# Patient Record
Sex: Male | Born: 1970 | Race: White | Hispanic: No | Marital: Married | Smoking: Never smoker
Health system: Southern US, Community
[De-identification: ages and names within clinical notes are randomized; demographics above are authoritative.]

## PROBLEM LIST (undated history)

## (undated) DIAGNOSIS — K219 Gastro-esophageal reflux disease without esophagitis: Secondary | ICD-10-CM

## (undated) DIAGNOSIS — Z9889 Other specified postprocedural states: Secondary | ICD-10-CM

## (undated) DIAGNOSIS — J45909 Unspecified asthma, uncomplicated: Secondary | ICD-10-CM

## (undated) DIAGNOSIS — Z8719 Personal history of other diseases of the digestive system: Secondary | ICD-10-CM

## (undated) DIAGNOSIS — J189 Pneumonia, unspecified organism: Secondary | ICD-10-CM

## (undated) DIAGNOSIS — M5136 Other intervertebral disc degeneration, lumbar region: Secondary | ICD-10-CM

## (undated) DIAGNOSIS — M2420 Disorder of ligament, unspecified site: Secondary | ICD-10-CM

## (undated) DIAGNOSIS — K5900 Constipation, unspecified: Secondary | ICD-10-CM

## (undated) DIAGNOSIS — R112 Nausea with vomiting, unspecified: Secondary | ICD-10-CM

## (undated) DIAGNOSIS — M1990 Unspecified osteoarthritis, unspecified site: Secondary | ICD-10-CM

## (undated) DIAGNOSIS — G4730 Sleep apnea, unspecified: Secondary | ICD-10-CM

## (undated) DIAGNOSIS — K760 Fatty (change of) liver, not elsewhere classified: Secondary | ICD-10-CM

## (undated) DIAGNOSIS — M5410 Radiculopathy, site unspecified: Secondary | ICD-10-CM

## (undated) DIAGNOSIS — F419 Anxiety disorder, unspecified: Secondary | ICD-10-CM

## (undated) DIAGNOSIS — Z87442 Personal history of urinary calculi: Secondary | ICD-10-CM

## (undated) DIAGNOSIS — M51369 Other intervertebral disc degeneration, lumbar region without mention of lumbar back pain or lower extremity pain: Secondary | ICD-10-CM

## (undated) DIAGNOSIS — K4020 Bilateral inguinal hernia, without obstruction or gangrene, not specified as recurrent: Secondary | ICD-10-CM

## (undated) HISTORY — PX: CHOLECYSTECTOMY: SHX55

## (undated) HISTORY — PX: OTHER SURGICAL HISTORY: SHX169

## (undated) HISTORY — PX: WISDOM TOOTH EXTRACTION: SHX21

---

## 2001-04-13 ENCOUNTER — Emergency Department (HOSPITAL_COMMUNITY): Admission: EM | Admit: 2001-04-13 | Discharge: 2001-04-14 | Payer: Self-pay | Admitting: Emergency Medicine

## 2001-04-14 ENCOUNTER — Encounter: Payer: Self-pay | Admitting: Emergency Medicine

## 2001-09-25 ENCOUNTER — Inpatient Hospital Stay (HOSPITAL_COMMUNITY): Admission: EM | Admit: 2001-09-25 | Discharge: 2001-09-30 | Payer: Self-pay | Admitting: Psychiatry

## 2002-03-04 ENCOUNTER — Encounter: Payer: Self-pay | Admitting: Emergency Medicine

## 2002-03-04 ENCOUNTER — Emergency Department (HOSPITAL_COMMUNITY): Admission: EM | Admit: 2002-03-04 | Discharge: 2002-03-04 | Payer: Self-pay | Admitting: Emergency Medicine

## 2002-06-17 ENCOUNTER — Emergency Department (HOSPITAL_COMMUNITY): Admission: EM | Admit: 2002-06-17 | Discharge: 2002-06-17 | Payer: Self-pay | Admitting: Emergency Medicine

## 2002-06-17 ENCOUNTER — Encounter: Payer: Self-pay | Admitting: Emergency Medicine

## 2002-08-03 ENCOUNTER — Encounter (HOSPITAL_COMMUNITY): Admission: RE | Admit: 2002-08-03 | Discharge: 2002-09-02 | Payer: Self-pay | Admitting: Orthopedic Surgery

## 2003-07-28 ENCOUNTER — Emergency Department (HOSPITAL_COMMUNITY): Admission: EM | Admit: 2003-07-28 | Discharge: 2003-07-28 | Payer: Self-pay | Admitting: *Deleted

## 2004-03-30 DIAGNOSIS — Z87442 Personal history of urinary calculi: Secondary | ICD-10-CM

## 2004-03-30 HISTORY — DX: Personal history of urinary calculi: Z87.442

## 2004-09-02 ENCOUNTER — Ambulatory Visit: Payer: Self-pay | Admitting: Internal Medicine

## 2004-09-02 ENCOUNTER — Ambulatory Visit (HOSPITAL_COMMUNITY): Admission: RE | Admit: 2004-09-02 | Discharge: 2004-09-02 | Payer: Self-pay | Admitting: Internal Medicine

## 2004-10-18 ENCOUNTER — Emergency Department (HOSPITAL_COMMUNITY): Admission: EM | Admit: 2004-10-18 | Discharge: 2004-10-18 | Payer: Self-pay | Admitting: Emergency Medicine

## 2005-01-20 ENCOUNTER — Inpatient Hospital Stay (HOSPITAL_COMMUNITY): Admission: EM | Admit: 2005-01-20 | Discharge: 2005-01-24 | Payer: Self-pay | Admitting: Emergency Medicine

## 2007-05-08 ENCOUNTER — Emergency Department (HOSPITAL_COMMUNITY): Admission: EM | Admit: 2007-05-08 | Discharge: 2007-05-08 | Payer: Self-pay | Admitting: Emergency Medicine

## 2008-06-06 ENCOUNTER — Emergency Department (HOSPITAL_COMMUNITY): Admission: EM | Admit: 2008-06-06 | Discharge: 2008-06-06 | Payer: Self-pay | Admitting: Emergency Medicine

## 2010-08-15 NOTE — Discharge Summary (Signed)
NAME:  Zachary Nash, BISPING NO.:  [id number]   MEDICAL RECORD NO.:  [id number]          PATIENT TYPE:  INP   LOCATION:  A306                          FACILITY:  [facility]   PHYSICIAN:  Corrie Mckusick, M.D.  DATE OF BIRTH:  1970-09-22   DATE OF ADMISSION:  01/20/2005  DATE OF DISCHARGE:  10/28/2006LH                                 DISCHARGE SUMMARY   For history of present illness and past medical history, please see  admission H&P.   HOSPITAL COURSE:  A 40 year old gentleman with past medical history of  cholecystectomy and overall pretty healthy who presents with renal colic. He  was seen by Dr. Sherwood Gambler and the physician's assistant in our office and had a  negative workup as an outpatient. He was given orders by Dr. Sherwood Gambler and sent  to the ER for admission for further workup and urology consult. Initial CT  was suggestive of renal colic but otherwise negative. Dr. Jerre Simon agreed with  keeping him on fluids.   We changed the patient to Rocephin as he had had a questionable allergy to  Levaquin, and we kept him on this. Dr. Jerre Simon also discussed starting  Flomax.   After admission, the patient was taken care of by his primary care  physician, Dr. Sherwood Gambler, as I was out of town. He continued to improve on a  daily basis and was kept pain free with Dilaudid. He was ready for discharge  on October 28. Please see Dr. Sharyon Medicus note on day of discharge for discharge  physical.   DISCHARGE MEDICATIONS:  1.  Dilaudid 4 mg 1 to 2 every 4 hours as needed for pain.  2.  Keflex 500 mg t.i.d. for a week.  3.  Pyridium 100 mg t.i.d. p.r.n.   Again, see Dr. Sharyon Medicus note for details on day of discharge.      Corrie Mckusick, M.D.  Electronically Signed     JCG/MEDQ  D:  02/18/2005  T:  02/18/2005  Job:  [id number]

## 2010-08-15 NOTE — H&P (Signed)
NAME:  Zachary Nash, Zachary Nash               ACCOUNT NO.:  [id number]   MEDICAL RECORD NO.:  [id number]          PATIENT TYPE:  INP   LOCATION:  A306                          FACILITY:  [facility]   PHYSICIAN:  Corrie Mckusick, M.D.  DATE OF BIRTH:  03-08-1971   DATE OF ADMISSION:  01/20/2005  DATE OF DISCHARGE:  LH                                HISTORY & PHYSICAL   ADMISSION DIAGNOSIS:  Flank pain.   HISTORY OF PRESENT ILLNESS:  This is a 40 year old gentleman who saw our  physicians assistant in the office with flank pain, was sent for CT which  was negative.  He is a patient of Dr. Sharyon Medicus and was followed back up with  Dr. Sherwood Gambler and was sent to the emergency department.  In the emergency room,  workup was overall negative.  Dr. Sherwood Gambler knows the patient well and will see  him in the hospital.  He basically had a one day history of recurrent  episodes of left flank pain with nausea and no vomiting, no fever or chills.  He has been complaining of severe urgency and dysuria.   He was placed in the hospital for PEG control and further delineation of his  history.   PAST MEDICAL HISTORY:  Cholecystectomy.  No other past medical history at  all.   FAMILY HISTORY:  Nonsignificant.   SOCIAL HISTORY:  Negative for alcohol use, tobacco use.   PHYSICAL EXAMINATION:  VITAL SIGNS:  Temperature 98.0, blood pressure  107/62, pulse 74, respirations 20, weight 190.  GENERAL APPEARANCE:  When I saw him, he was pleasant, talkative and in no  acute distress.  HEENT:  Negative. Throat is clear.  Mucous membranes moist.  NECK:  Supple.  No lymphadenopathy.  CHEST:  Clear to auscultation bilaterally.  CARDIOVASCULAR:  Regular rate and rhythm.  Normal S1, S2.  NO S3, murmurs,  gallops, rubs.  ABDOMEN:  Soft.  There is tenderness on the left flank.  Otherwise, really  benign exam.  No rebound or guarding.  No hepatosplenomegaly.  EXTREMITIES:  No edema.   LABORATORY DATA:  Please see flow sheet for details. CT  was negative as  stated.   ASSESSMENT/PLAN:  50.  A 40 year old with what appears to be left renal colic.  Plan to admit      for IV fluids and pain control. 2.  Orders were given by Dr. Sherwood Gambler.      Please see order sheet for details.  2.  Urology will be consulted and further workup in progress.      Corrie Mckusick, M.D.  Electronically Signed     JCG/MEDQ  D:  01/23/2005  T:  01/23/2005  Job:  [id number]

## 2010-08-15 NOTE — Discharge Summary (Signed)
NAME:  CARA, AGUINO                         ACCOUNT NO.:  [id number]   MEDICAL RECORD NO.:  [id number]                   PATIENT TYPE:  IPS   LOCATION:  0500                                 FACILITY:  [facility]   PHYSICIAN:  Reymundo Poll. Dub Mikes, M.D.                DATE OF BIRTH:  [birth year]   DATE OF ADMISSION:  09/25/2001  DATE OF DISCHARGE:  09/30/2001                                 DISCHARGE SUMMARY   CHIEF COMPLAINT AND PRESENT ILLNESS:  This was the first admission to [facility] for this 40 year old male who reported to the ER  complaining of suicidal thoughts and plan to run his truck off the road into  a tree.  He reports increasing depression for last 4-6 weeks, endorsing  loneliness in job, poor sleep.  He is a Naval architect.  Decreased appetite  with weight loss.  Poor concentration.  Increased sadness.  Progressing  anhedonia.  Marital conflict over the past 1-2 years, since her son was  born.  Just started driving alone many hours.  Denies any homicidal ideation  or auditory or visual hallucinations.   PAST PSYCHIATRIC HISTORY:  First time inpatient.  No previous treatment.   ALCOHOL/DRUG HISTORY:  Denies the use or abuse of any substances.  He is a  Naval architect.   PHYSICAL EXAMINATION:  Performed and failed to show any acute findings.   MENTAL STATUS EXAM:  Healthy, fully alert male, polite, cooperative,  pleasant.  Speech normal and relevant.  Mood depressed.  Affect depressed.  Thought processes deal with the events, feeling very overwhelmed.  Some  suicidal ruminations but will contract for safety.  No homicidal ideation.  No auditory or visual hallucinations.  No evidence of psychosis.  Cognition  well-preserved.   ADMISSION DIAGNOSES:   AXIS I:  Major depression, single episode.   AXIS II:  No diagnosis.   AXIS III:  No diagnosis.   AXIS IV:  Moderate.   AXIS V:  Global Assessment of Functioning upon admission 38; highest Global  Assessment of Functioning in the last year 80.   LABORATORY DATA:  Blood chemistries were within normal limits.  Thyroid  profile within normal limits.   HOSPITAL COURSE:  He was admitted and started intensive individual and group  psychotherapy.  He was able to look into the events, his long history of  trauma, the feeling when he left the parental rights of her older daughter,  his ability to get himself to accept it, how he feels strange about the  younger son because he did not give the older daughter what he can give his  son now, a lot of conflict.  He continued to work, in the group setting and  the individual setting, on his issues, his losses, worked on his self-  esteem.  Gradually, he started to improve.  His mood became brighter.  There  was  some resolution in the conflict with the wife.  She was willing to get  back together and for them to work on their issues.  He was started on  Lexapro 10 mg that he tolerated quite well.  By September 30, 2001, he had  obtained full benefit from the hospitalization, in full contact with  reality.  Mood improved.  Affect bright.  No suicidal ideation.  No  homicidal ideation.  Wanted to pursue further outpatient treatment.  Has  worked on a lot of his issues but understands that there are a lot of other  issues to have to work with.   DISCHARGE DIAGNOSES:   AXIS I:  Major depression, single episode.   AXIS II:  No diagnosis.   AXIS III:  No diagnosis.   AXIS IV:  Moderate.   AXIS V:  Global Assessment of Functioning upon discharge 60.   DISCHARGE MEDICATIONS:  1. Lexapro 10 mg daily.  2. Ambien 10 mg at bedtime for sleep.   FOLLOW UP:  [facility].                                                Madie Reno A. Dub Mikes, M.D.    IAL/MEDQ  D:  11/02/2001  T:  11/06/2001  Job:  [id number]

## 2010-08-15 NOTE — Op Note (Signed)
NAME:  Zachary Nash, Zachary Nash               ACCOUNT NO.:  [id number]   MEDICAL RECORD NO.:  [id number]          PATIENT TYPE:  AMB   LOCATION:  DAY                           FACILITY:  [facility]   PHYSICIAN:  Lionel December, M.D.    DATE OF BIRTH:  03/03/1971   DATE OF PROCEDURE:  09/02/2004  DATE OF DISCHARGE:                                 OPERATIVE REPORT   PROCEDURE:  Esophagogastroduodenoscopy with esophageal dilation.   INDICATION:  [name] is a 40 year old Caucasian male with two-month history  of dysphagia, primarily to solids.  He has had frequent heartburn for the  last two weeks.  He also has lost 20 pounds because food would not go down.  He has not had any hematemesis or melena.  He does not take any medicines  other than occasional Tylenol.   The procedure risks were reviewed the patient, informed consent was  obtained.   PREMEDICATION:  Cetacaine spray for pharyngeal topical anesthesia, Demerol  50 mg IV, Versed 9 mg IV in divided dose.   FINDINGS:  Procedure performed in endoscopy suite.  The patient's vital  signs and O2 saturation were monitored during procedure and remained stable.  The patient was in the placed left lateral recumbent position and the  Olympus video scope was passed via oropharynx without any difficulty into  esophagus.   Esophagus:  Mucosa of the proximal and middle third was normal.  Distally he  had two ulcers extending into GE junction.  He had soft stricture at GE  junction, which was at 38 cm and hiatus was 41.   Stomach:  It was empty and distended very well with insufflation.  Folds of  the proximal stomach were normal.  Examination of the mucosa revealed antral  granularity and erythema but no erosions or ulcers were noted.  The pyloric  channel was patent.  Angularis, fundus and cardia were examined by  retroflexing the scope and were normal.   Duodenum:  Bulbar mucosa was normal.  The scope was passed to the second  part of the duodenum, where  mucosa and folds were normal.  Endoscope was  withdrawn.   Esophagus was dilated by passing 56-French Maloney dilator to full  insertion.  As the dilator was withdrawn, endoscope was passed again and the  stricture was noted to have been disrupted at two sites.  Pictures taken for  the record and the endoscope was withdrawn.  The patient tolerated the  procedure well.   FINAL DIAGNOSES:  1.  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  The stricture was dilated by passing 56-French [facility]      dilator.  2.  Small sliding hiatal hernia.  3.  Nonerosive antral gastritis.   RECOMMENDATIONS:  1.  Antireflux measures.  2.  Prevacid SoluTab 30 mg p.o. q.a.m., prescription given for 30 with 11      refills.  3.  H. pylori serology will be checked.  4.  I would like to see the patient office in three months from now.  NR/MEDQ  D:  09/02/2004  T:  09/02/2004  Job:  [id number]   cc:   Kirk Ruths, M.D.  [address]  [place]  [place] [address]  Fax: [phone]

## 2010-08-15 NOTE — Op Note (Signed)
NAME:  Zachary Nash, Zachary Nash               ACCOUNT NO.:  [id number]   MEDICAL RECORD NO.:  [id number]          PATIENT TYPE:  INP   LOCATION:  A306                          FACILITY:  [facility]   PHYSICIAN:  Ky Barban, M.D.DATE OF BIRTH:  20-Jan-1971   DATE OF PROCEDURE:  01/23/2005  DATE OF DISCHARGE:                                 OPERATIVE REPORT   PREOPERATIVE DIAGNOSIS:  Left flank pain, dysuria.   POSTOPERATIVE DIAGNOSIS:  Posterior urethritis, urethritis, mild cystitis,  normal left retrograde pyelogram.   PROCEDURE:  Cystoscopy, left retrograde pyelogram.   ANESTHESIA:  General.   PROCEDURE:  The patient was given general endotracheal anesthesia, placed in  lithotomy position, usual prep and drape. A #25 cystoscope was introduced  under direct vision. Anterior urethra near the sphincter in the bulbar area  is reddish and friable and that extends into the prostatic urethra. Bladder  neck was opened. Bladder trigone area is slightly reddish and friable,  bleeds very easily, but there is no other gross evidence of any tumor,  stone, or foreign body in the bladder. Both orifices located at normal size  with a clear efflux. Left ureteral orifice catheterized with wedge catheter  and Hypaque injected under fluoroscopic control. Ureter is followed towards  the renal pelvis. The ureter looks normal caliber. No filling defect. Renal  pelvis normal size with nice cup shaped calices. No filling defect, and I  can see the drainage appears to be satisfactory from the renal pelvis and  the ureter. Cystoscope was removed. Rectal exam was done. His anal sphincter  was relaxed, prostate feels normal, no rectal mass. I see a lot of gas in  his colon and rectum. Otherwise retrograde pyelogram is completely normal.  All of the instruments were removed. The patient left the operating room in  satisfactory condition.      Ky Barban, M.D.  Electronically Signed     MIJ/MEDQ  D:   01/23/2005  T:  01/23/2005  Job:  [id number]

## 2010-08-15 NOTE — Consult Note (Signed)
NAME:  Zachary Nash, Zachary Nash               ACCOUNT NO.:  [id number]   MEDICAL RECORD NO.:  [id number]          PATIENT TYPE:  INP   LOCATION:  A306                          FACILITY:  [facility]   PHYSICIAN:  Ky Barban, M.D.DATE OF BIRTH:  May 14, 1970   DATE OF CONSULTATION:  01/21/2005  DATE OF DISCHARGE:                                   CONSULTATION   UROLOGY CONSULTATION:   CHIEF COMPLAINT:  Recurrent left flank pain with nausea; no vomiting, fever,  chills; has severe dysuria.   HISTORY:  This 40 year old gentleman came to the emergency room yesterday  about noontime complaining of 1-day history of recurrent episodes of left  flank pain with nausea, no vomiting, fever, chills, complaining of severe  urgency and dysuria.  I was called from the emergency room to see the  patient in the ER.  In the ER he had a CT scan which was negative for any  stone and I was in the operating room.  I could not come and see but I  offered that the patient can come in a couple of hours to the office then I  can see him there or be admitted so patient was admitted by his primary care  physician so I am here to see him today.  Patient otherwise no fever,  chills, no history of kidney stones in the past, still having this dysuria  and bothersome urgency.   PAST MEDICAL HISTORY:  He had cholecystectomy in the past, no other problem  like diabetes or hypertension, never had any kidney stones.   FAMILY HISTORY:  No history of kidney stone.   PERSONAL HISTORY:  Negative.   REVIEW OF SYSTEMS:  Unremarkable.   EXAMINATION:  Blood pressure is 130/80, temperature is normal.  Abdomen  soft, flat, liver/spleen/kidneys are not palpable, deep tenderness left  flank and left lower quadrant.  External genitalia circumcised, meatus  adequate, testicles are normal.   CT SCAN:  CT scan, as I mentioned above, basically negative for any kidney  stones.   LABORATORY DATA:  WBC count is 5.3, hematocrit 47.9.  He  has 7% eosinophils  in the blood.  Urinalysis is negative except wbc's are 11-20.  His  electrolytes:  Sodium 136, potassium 3.6, chloride 105, CO2 is 25, glucose  96, BUN 11, creatinine 1.3.   IMPRESSION:  Left renal colic.  Although his CT scan is negative, the  history is very suggestive of renal colic.  He might have passed a stone and  still has some blood clot in his ureter but his urinalysis being completely  negative goes against it.  I will treat him symptomatically.  He has already  been started on Rocephin, I will add Flomax, if there is any stone or small  clot that should come out.  I reassured the patient who was very  apprehensive and  concerned that he might have some serious disease.  I reassured him and I  will see him over the next 24 hours to see how he does.  If he continues to  have the problem then we will  cystoscope him under anesthesia on Friday.  I  appreciate Dr. Phillips Odor for letting me see him.      Ky Barban, M.D.  Electronically Signed     MIJ/MEDQ  D:  01/21/2005  T:  01/21/2005  Job:  [id number]   cc:   Dr. Phillips Odor

## 2011-04-27 ENCOUNTER — Ambulatory Visit: Payer: Self-pay | Admitting: Physical Therapy

## 2012-03-17 ENCOUNTER — Encounter (HOSPITAL_COMMUNITY): Payer: Self-pay | Admitting: *Deleted

## 2012-03-17 ENCOUNTER — Emergency Department (HOSPITAL_COMMUNITY)
Admission: EM | Admit: 2012-03-17 | Discharge: 2012-03-17 | Disposition: A | Discharge status: Home / Self Care | Payer: BC Managed Care – PPO | Attending: Emergency Medicine | Admitting: Emergency Medicine

## 2012-03-17 DIAGNOSIS — R509 Fever, unspecified: Secondary | ICD-10-CM | POA: Insufficient documentation

## 2012-03-17 DIAGNOSIS — R05 Cough: Secondary | ICD-10-CM | POA: Insufficient documentation

## 2012-03-17 DIAGNOSIS — R059 Cough, unspecified: Secondary | ICD-10-CM | POA: Insufficient documentation

## 2012-03-17 DIAGNOSIS — J3489 Other specified disorders of nose and nasal sinuses: Secondary | ICD-10-CM | POA: Insufficient documentation

## 2012-03-17 DIAGNOSIS — J45909 Unspecified asthma, uncomplicated: Secondary | ICD-10-CM | POA: Insufficient documentation

## 2012-03-17 DIAGNOSIS — H9202 Otalgia, left ear: Secondary | ICD-10-CM

## 2012-03-17 DIAGNOSIS — J029 Acute pharyngitis, unspecified: Secondary | ICD-10-CM | POA: Insufficient documentation

## 2012-03-17 DIAGNOSIS — Z880 Allergy status to penicillin: Secondary | ICD-10-CM | POA: Insufficient documentation

## 2012-03-17 DIAGNOSIS — Z8669 Personal history of other diseases of the nervous system and sense organs: Secondary | ICD-10-CM | POA: Insufficient documentation

## 2012-03-17 DIAGNOSIS — H9209 Otalgia, unspecified ear: Secondary | ICD-10-CM | POA: Insufficient documentation

## 2012-03-17 DIAGNOSIS — J069 Acute upper respiratory infection, unspecified: Secondary | ICD-10-CM | POA: Insufficient documentation

## 2012-03-17 DIAGNOSIS — M542 Cervicalgia: Secondary | ICD-10-CM | POA: Insufficient documentation

## 2012-03-17 HISTORY — DX: Unspecified asthma, uncomplicated: J45.909

## 2012-03-17 MED ORDER — OXYCODONE-ACETAMINOPHEN 5-325 MG PO TABS: 1.0000 | ORAL_TABLET | ORAL | Status: AC | PRN

## 2012-03-17 MED ORDER — DEXAMETHASONE SODIUM PHOSPHATE 4 MG/ML IJ SOLN
10.0000 mg | Freq: Once | INTRAMUSCULAR | Status: AC
  Administered 2012-03-17: 10 mg via INTRAMUSCULAR
  Filled 2012-03-17: qty 3

## 2012-03-17 MED ORDER — OXYCODONE-ACETAMINOPHEN 5-325 MG PO TABS
1.0000 | ORAL_TABLET | Freq: Once | ORAL | Status: AC
  Administered 2012-03-17: 1 via ORAL
  Filled 2012-03-17: qty 1

## 2012-03-17 NOTE — ED Notes (Signed)
Pt reports left ear pain x 2 years; c/o cough, nasal congestion x 1 week; was seen and tx for same 3 days ago; reports when swallowing a pill this morning, he felt a "pop" in left ear, and has since had pain radiating from left ear to left jaw and left side of throat.  Reports pain so severe that it is interfering with him swallowing.

## 2012-03-17 NOTE — ED Notes (Signed)
Instructions, prescriptions and f/u information given/reviewed - verbalizes understanding.  

## 2012-03-19 NOTE — ED Provider Notes (Signed)
History     CSN: [id number]  Arrival date & time 03/17/12  1133   First MD Initiated Contact with Patient 03/17/12 1148      Chief Complaint  Patient presents with  . Otalgia  . Nasal Congestion  . Cough    (Consider location/radiation/quality/duration/timing/severity/associated sxs/prior treatment) HPI Comments: Patient with hx of chronic ear pain for 2 years, c/o sudden onset of increased, sharp pain to his left ear that began after he swallowed a pill and felt a "pop" to his ear.  States the pain has been radiating into his neck and side of his face since that time.  States he was seen for ear pain, fever, nasal congestion 3 days ago at the local urgent care and is currently taking zithromax, sterapred and cortisporin otic drops.  He denies neck stiffness, continued fever, vomiting, headache or visual changes.  Patient is a 41 y.o. male presenting with ear pain and cough. The history is provided by the patient.  Otalgia This is a recurrent problem. The current episode started more than 1 week ago. There is pain in the left ear. The problem occurs constantly. The problem has been gradually worsening. There has been no fever. The pain is moderate. Associated symptoms include rhinorrhea, sore throat, neck pain and cough. Pertinent negatives include no ear discharge, no headaches, no hearing loss, no abdominal pain, no diarrhea, no vomiting and no rash. His past medical history is significant for chronic ear infection.  Cough Associated symptoms include ear pain, rhinorrhea and sore throat. Pertinent negatives include no chest pain, no chills, no headaches, no shortness of breath and no wheezing.    Past Medical History  Diagnosis Date  . Asthma     History reviewed. No pertinent past surgical history.  No family history on file.  History  Substance Use Topics  . Smoking status: Never Smoker   . Smokeless tobacco: Not on file  . Alcohol Use: No      Review of Systems   Constitutional: Negative for fever, chills, activity change and appetite change.  HENT: Positive for ear pain, congestion, sore throat, rhinorrhea and neck pain. Negative for hearing loss, nosebleeds, facial swelling, neck stiffness, tinnitus and ear discharge.   Respiratory: Positive for cough. Negative for choking, shortness of breath and wheezing.   Cardiovascular: Negative for chest pain.  Gastrointestinal: Negative for vomiting, abdominal pain and diarrhea.  Skin: Negative for rash.  Neurological: Negative for dizziness, syncope, facial asymmetry, speech difficulty, numbness and headaches.  Hematological: Negative for adenopathy.  All other systems reviewed and are negative.    Allergies  Penicillins  Home Medications   Current Outpatient Rx  Name  Route  Sig  Dispense  Refill  . OXYCODONE-ACETAMINOPHEN 5-325 MG PO TABS   Oral   Take 1 tablet by mouth every 4 (four) hours as needed for pain.   20 tablet   0     BP 112/65  Pulse 100  Temp 98 F (36.7 C) (Oral)  Resp 20  Ht 5\' 10"  (1.778 m)  Wt 190 lb (86.183 kg)  BMI 27.26 kg/m2  SpO2 99%  Physical Exam  Nursing note and vitals reviewed. Constitutional: He is oriented to person, place, and time. He appears well-developed and well-nourished. No distress.  HENT:  Head: Normocephalic and atraumatic.  Right Ear: Tympanic membrane and ear canal normal.  Left Ear: There is tenderness. No drainage or swelling. No mastoid tenderness. Tympanic membrane is not perforated, not erythematous, not retracted and not  bulging. A middle ear effusion is present. No hemotympanum.  Mouth/Throat: Uvula is midline, oropharynx is clear and moist and mucous membranes are normal.  Eyes: EOM are normal. Pupils are equal, round, and reactive to light.  Neck: Normal range of motion. Neck supple.  Cardiovascular: Normal rate, regular rhythm, normal heart sounds and intact distal pulses.   No murmur heard. Pulmonary/Chest: Effort normal and  breath sounds normal.  Musculoskeletal: Normal range of motion. He exhibits no edema and no tenderness.  Lymphadenopathy:    He has no cervical adenopathy.  Neurological: He is alert and oriented to person, place, and time. He exhibits normal muscle tone. Coordination normal.  Skin: Skin is warm and dry.    ED Course  Procedures (including critical care time)  Labs Reviewed - No data to display No results found.   1. Otalgia of left ear   2. URI (upper respiratory infection)       MDM    Pt is resting, vitals stable,  No meningeal signs.  Airway patent.  No cervical lymphadenopathy.  Pt is well appearing.  Will treat with percocet #20 and pt agrees to continue previous medications as directed.  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  No perforation of the TM.        Dae Antonucci L. [place], [place] 03/19/12 [phone]

## 2012-03-22 NOTE — ED Provider Notes (Signed)
Medical screening examination/treatment/procedure(s) were performed by non-physician practitioner and as supervising physician I was immediately available for consultation/collaboration.  Jaque Dacy, MD 03/22/12 1833 

## 2012-10-05 ENCOUNTER — Encounter (HOSPITAL_COMMUNITY): Payer: Self-pay | Admitting: Emergency Medicine

## 2012-10-05 ENCOUNTER — Emergency Department (HOSPITAL_COMMUNITY)
Admission: EM | Admit: 2012-10-05 | Discharge: 2012-10-06 | Disposition: A | Discharge status: Home / Self Care | Payer: BC Managed Care – PPO | Attending: Emergency Medicine | Admitting: Emergency Medicine

## 2012-10-05 DIAGNOSIS — T7840XA Allergy, unspecified, initial encounter: Secondary | ICD-10-CM

## 2012-10-05 DIAGNOSIS — T380X5A Adverse effect of glucocorticoids and synthetic analogues, initial encounter: Secondary | ICD-10-CM | POA: Insufficient documentation

## 2012-10-05 DIAGNOSIS — J45901 Unspecified asthma with (acute) exacerbation: Secondary | ICD-10-CM | POA: Insufficient documentation

## 2012-10-05 DIAGNOSIS — R6883 Chills (without fever): Secondary | ICD-10-CM | POA: Insufficient documentation

## 2012-10-05 DIAGNOSIS — Z880 Allergy status to penicillin: Secondary | ICD-10-CM | POA: Insufficient documentation

## 2012-10-05 DIAGNOSIS — R21 Rash and other nonspecific skin eruption: Secondary | ICD-10-CM | POA: Insufficient documentation

## 2012-10-05 MED ORDER — METHYLPREDNISOLONE SODIUM SUCC 125 MG IJ SOLR
125.0000 mg | Freq: Once | INTRAMUSCULAR | Status: AC
  Administered 2012-10-05: 125 mg via INTRAVENOUS
  Filled 2012-10-05: qty 2

## 2012-10-05 MED ORDER — PREDNISONE 20 MG PO TABS: ORAL_TABLET | ORAL | Status: DC

## 2012-10-05 MED ORDER — IPRATROPIUM BROMIDE 0.02 % IN SOLN
0.5000 mg | Freq: Once | RESPIRATORY_TRACT | Status: AC
  Administered 2012-10-05: 0.5 mg via RESPIRATORY_TRACT
  Filled 2012-10-05: qty 2.5

## 2012-10-05 MED ORDER — FAMOTIDINE IN NACL 20-0.9 MG/50ML-% IV SOLN
20.0000 mg | Freq: Once | INTRAVENOUS | Status: AC
  Administered 2012-10-05: 20 mg via INTRAVENOUS
  Filled 2012-10-05: qty 50

## 2012-10-05 MED ORDER — DIPHENHYDRAMINE HCL 50 MG/ML IJ SOLN
25.0000 mg | Freq: Once | INTRAMUSCULAR | Status: AC
  Administered 2012-10-05: 25 mg via INTRAVENOUS
  Filled 2012-10-05: qty 1

## 2012-10-05 MED ORDER — SODIUM CHLORIDE 0.9 % IV BOLUS (SEPSIS)
1000.0000 mL | Freq: Once | INTRAVENOUS | Status: AC
  Administered 2012-10-05: 1000 mL via INTRAVENOUS

## 2012-10-05 MED ORDER — ALBUTEROL SULFATE (5 MG/ML) 0.5% IN NEBU
5.0000 mg | INHALATION_SOLUTION | Freq: Once | RESPIRATORY_TRACT | Status: AC
  Administered 2012-10-05: 5 mg via RESPIRATORY_TRACT
  Filled 2012-10-05: qty 1

## 2012-10-05 NOTE — ED Notes (Signed)
Allergic reaction, hives, "feels like heart is skipping"   Started on BREO this week   - this is his third treatment.

## 2012-10-05 NOTE — ED Notes (Signed)
Pt c/o possible allergic reaction to new medication. Pt states he started a new inhaler 3 days ago and since starting reports increased SOB and fatigue. Pt presents with a rash to his back which he first noticed tonight. Pt denies dizziness, lightheadedness, chest pain. Pt has hx of asthma.

## 2012-10-05 NOTE — ED Provider Notes (Signed)
History  This chart was scribed for Zachary Hutching, MD by Ardelia Mems, ED Scribe. This patient was seen in room APA18/APA18 and the patient's care was started at 9:54 PM.  CSN: [id number]  Arrival date & time 10/05/12  2142   Chief Complaint  Patient presents with  . Allergic Reaction    new meds for asthma COPD   "BREO"    The history is provided by the patient. No language interpreter was used.   HPI Comments: Zachary Nash is a 42 y.o. male with a hx of asthma who presents to the Emergency Department  complaining of 3 days of SOB as well as an allergic reaction onset earlier today. Pt states that he was switched from an Advair inhaler to Breo inhaler this week as a new asthma medication. Pt has used his inhaler 3 times, once a day for the last three days, and states that he has had gradually worsening SOB for 3 days. Pt states that his back broke out into a itchy rash today, and he is not sure whether this happened because of the medication or because he was working outside today. Pt also states that he feels like his heart is skipping, and reports chills over the past 3 days. Pt denies fever, chest pain, sore throat, cough, vomiting or any other symptoms. Pt denies alcohol use and denies any hx of smoking.   PCP- Dr. Kirstie Peri   Past Medical History  Diagnosis Date  . Asthma    Past Surgical History  Procedure Laterality Date  . Cholecystectomy     No family history on file. History  Substance Use Topics  . Smoking status: Never Smoker   . Smokeless tobacco: Not on file  . Alcohol Use: No    Review of Systems  Constitutional: Positive for chills. Negative for fever.  HENT: Negative for congestion, sore throat, rhinorrhea and neck pain.   Eyes: Negative for visual disturbance.  Respiratory: Positive for shortness of breath. Negative for cough.   Cardiovascular: Negative for chest pain.  Gastrointestinal: Negative for nausea, vomiting, abdominal pain and diarrhea.   Genitourinary: Negative for dysuria.  Musculoskeletal: Negative for back pain.  Skin: Positive for rash.  Neurological: Negative for headaches.  Psychiatric/Behavioral: Negative for confusion.  A complete 10 system review of systems was obtained and all systems are negative except as noted in the HPI and PMH.   Allergies  Penicillins  Home Medications  No current outpatient prescriptions on file.  Triage Vitals: BP 138/85  Pulse 102  Temp(Src) 98.1 F (36.7 C) (Oral)  Resp 22  Ht 5' 10.25" (1.784 m)  Wt 198 lb (89.812 kg)  BMI 28.22 kg/m2  SpO2 100%  Physical Exam  Nursing note and vitals reviewed. Constitutional: He is oriented to person, place, and time. He appears well-developed and well-nourished.  HENT:  Head: Normocephalic and atraumatic.  Eyes: Conjunctivae and EOM are normal. Pupils are equal, round, and reactive to light.  Neck: Normal range of motion. Neck supple.  Cardiovascular: Normal rate, regular rhythm and normal heart sounds.   Pulmonary/Chest: Effort normal and breath sounds normal. No respiratory distress. He has no wheezes.  Abdominal: Soft. Bowel sounds are normal.  Musculoskeletal: Normal range of motion.  Neurological: He is alert and oriented to person, place, and time.  Skin: Skin is warm and dry.  Diffuse, discreet, erythematous, papular rash.  Psychiatric: He has a normal mood and affect.    ED Course  Procedures (including critical care time)  DIAGNOSTIC STUDIES: Oxygen Saturation is 100% on RA, normal by my interpretation.    COORDINATION OF CARE: 10:00 PM- Pt advised of plan to receive a breathing treatment as well as Solu-medrol, benadryl and pepcid and pt agrees.  Medications  methylPREDNISolone sodium succinate (SOLU-MEDROL) 125 mg/2 mL injection 125 mg (125 mg Intravenous Given 10/05/12 2220)  famotidine (PEPCID) IVPB 20 mg (20 mg Intravenous New Bag/Given 10/05/12 2221)  diphenhydrAMINE (BENADRYL) injection 25 mg (25 mg Intravenous  Given 10/05/12 2220)  sodium chloride 0.9 % bolus 1,000 mL (1,000 mLs Intravenous New Bag/Given 10/05/12 2218)  albuterol (PROVENTIL) (5 MG/ML) 0.5% nebulizer solution 5 mg (5 mg Nebulization Given 10/05/12 2256)  ipratropium (ATROVENT) nebulizer solution 0.5 mg (0.5 mg Nebulization Given 10/05/12 2256)    Labs Reviewed - No data to display  No results found.  No diagnosis found.  MDM  Patient and wife attributes symptoms to new medication named BREO.   Patient feels much better after IV site Medrol, IV Pepcid, IV Benadryl.   Discontinue new medication.   Rx prednisone for 5 days       I personally performed the services described in this documentation, which was scribed in my presence. The recorded information has been reviewed and is accurate.   Zachary Hutching, MD 10/05/12 2348

## 2013-05-15 ENCOUNTER — Encounter (HOSPITAL_COMMUNITY): Payer: Self-pay | Admitting: Emergency Medicine

## 2013-05-15 ENCOUNTER — Emergency Department (HOSPITAL_COMMUNITY): Payer: BC Managed Care – PPO

## 2013-05-15 ENCOUNTER — Inpatient Hospital Stay (HOSPITAL_COMMUNITY)
Admission: EM | Admit: 2013-05-15 | Discharge: 2013-05-18 | DRG: 149 | Disposition: A | Discharge status: Home / Self Care | Payer: BC Managed Care – PPO | Attending: Family Medicine | Admitting: Family Medicine

## 2013-05-15 DIAGNOSIS — W010XXA Fall on same level from slipping, tripping and stumbling without subsequent striking against object, initial encounter: Secondary | ICD-10-CM | POA: Diagnosis present

## 2013-05-15 DIAGNOSIS — R000 Tachycardia, unspecified: Secondary | ICD-10-CM | POA: Diagnosis present

## 2013-05-15 DIAGNOSIS — S161XXA Strain of muscle, fascia and tendon at neck level, initial encounter: Secondary | ICD-10-CM

## 2013-05-15 DIAGNOSIS — Y92410 Unspecified street and highway as the place of occurrence of the external cause: Secondary | ICD-10-CM

## 2013-05-15 DIAGNOSIS — IMO0002 Reserved for concepts with insufficient information to code with codable children: Secondary | ICD-10-CM | POA: Diagnosis present

## 2013-05-15 DIAGNOSIS — R42 Dizziness and giddiness: Secondary | ICD-10-CM | POA: Diagnosis present

## 2013-05-15 DIAGNOSIS — G44309 Post-traumatic headache, unspecified, not intractable: Secondary | ICD-10-CM | POA: Diagnosis present

## 2013-05-15 DIAGNOSIS — F411 Generalized anxiety disorder: Secondary | ICD-10-CM | POA: Diagnosis present

## 2013-05-15 DIAGNOSIS — R739 Hyperglycemia, unspecified: Secondary | ICD-10-CM | POA: Diagnosis present

## 2013-05-15 DIAGNOSIS — K219 Gastro-esophageal reflux disease without esophagitis: Secondary | ICD-10-CM | POA: Diagnosis present

## 2013-05-15 DIAGNOSIS — J45909 Unspecified asthma, uncomplicated: Secondary | ICD-10-CM | POA: Diagnosis present

## 2013-05-15 DIAGNOSIS — R51 Headache: Secondary | ICD-10-CM | POA: Diagnosis present

## 2013-05-15 DIAGNOSIS — M549 Dorsalgia, unspecified: Secondary | ICD-10-CM

## 2013-05-15 DIAGNOSIS — Z79899 Other long term (current) drug therapy: Secondary | ICD-10-CM

## 2013-05-15 DIAGNOSIS — M5410 Radiculopathy, site unspecified: Secondary | ICD-10-CM | POA: Diagnosis present

## 2013-05-15 DIAGNOSIS — R7309 Other abnormal glucose: Secondary | ICD-10-CM | POA: Diagnosis present

## 2013-05-15 HISTORY — DX: Radiculopathy, site unspecified: M54.10

## 2013-05-15 LAB — POCT I-STAT, CHEM 8
BUN: 18 mg/dL (ref 6–23)
CREATININE: 1.2 mg/dL (ref 0.50–1.35)
Calcium, Ion: 1.14 mmol/L (ref 1.12–1.23)
Chloride: 105 mEq/L (ref 96–112)
Glucose, Bld: 117 mg/dL — ABNORMAL HIGH (ref 70–99)
HCT: 40 % (ref 39.0–52.0)
HEMOGLOBIN: 13.6 g/dL (ref 13.0–17.0)
POTASSIUM: 3.5 meq/L — AB (ref 3.7–5.3)
Sodium: 141 mEq/L (ref 137–147)
TCO2: 25 mmol/L (ref 0–100)

## 2013-05-15 MED ORDER — SODIUM CHLORIDE 0.9 % IV BOLUS (SEPSIS)
1000.0000 mL | Freq: Once | INTRAVENOUS | Status: AC
  Administered 2013-05-15: 1000 mL via INTRAVENOUS

## 2013-05-15 MED ORDER — HYDROCODONE-ACETAMINOPHEN 5-325 MG PO TABS: 1.0000 | ORAL_TABLET | Freq: Four times a day (QID) | ORAL | Status: DC | PRN

## 2013-05-15 MED ORDER — HYDROMORPHONE HCL PF 1 MG/ML IJ SOLN
1.0000 mg | Freq: Once | INTRAMUSCULAR | Status: AC
  Administered 2013-05-15: 1 mg via INTRAMUSCULAR
  Filled 2013-05-15: qty 1

## 2013-05-15 NOTE — Discharge Instructions (Signed)
Follow up with your md later this week for recheck
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
## 2013-05-16 ENCOUNTER — Observation Stay (HOSPITAL_COMMUNITY): Payer: BC Managed Care – PPO

## 2013-05-16 DIAGNOSIS — M549 Dorsalgia, unspecified: Secondary | ICD-10-CM

## 2013-05-16 DIAGNOSIS — S139XXA Sprain of joints and ligaments of unspecified parts of neck, initial encounter: Secondary | ICD-10-CM

## 2013-05-16 DIAGNOSIS — R42 Dizziness and giddiness: Principal | ICD-10-CM

## 2013-05-16 LAB — CBC WITH DIFFERENTIAL/PLATELET
BASOS ABS: 0 10*3/uL (ref 0.0–0.1)
Basophils Relative: 0 % (ref 0–1)
Eosinophils Absolute: 0.1 10*3/uL (ref 0.0–0.7)
Eosinophils Relative: 1 % (ref 0–5)
HCT: 41.1 % (ref 39.0–52.0)
Hemoglobin: 14.8 g/dL (ref 13.0–17.0)
LYMPHS ABS: 1.1 10*3/uL (ref 0.7–4.0)
LYMPHS PCT: 16 % (ref 12–46)
MCH: 30.6 pg (ref 26.0–34.0)
MCHC: 36 g/dL (ref 30.0–36.0)
MCV: 84.9 fL (ref 78.0–100.0)
Monocytes Absolute: 0.5 10*3/uL (ref 0.1–1.0)
Monocytes Relative: 7 % (ref 3–12)
NEUTROS ABS: 5.1 10*3/uL (ref 1.7–7.7)
NEUTROS PCT: 76 % (ref 43–77)
PLATELETS: 209 10*3/uL (ref 150–400)
RBC: 4.84 MIL/uL (ref 4.22–5.81)
RDW: 12.8 % (ref 11.5–15.5)
WBC: 6.7 10*3/uL (ref 4.0–10.5)

## 2013-05-16 LAB — COMPREHENSIVE METABOLIC PANEL
ALT: 18 U/L (ref 0–53)
AST: 16 U/L (ref 0–37)
Albumin: 3.9 g/dL (ref 3.5–5.2)
Alkaline Phosphatase: 64 U/L (ref 39–117)
BILIRUBIN TOTAL: 0.6 mg/dL (ref 0.3–1.2)
BUN: 16 mg/dL (ref 6–23)
CALCIUM: 8.7 mg/dL (ref 8.4–10.5)
CHLORIDE: 104 meq/L (ref 96–112)
CO2: 25 meq/L (ref 19–32)
Creatinine, Ser: 1.19 mg/dL (ref 0.50–1.35)
GFR calc Af Amer: 86 mL/min — ABNORMAL LOW (ref >90)
GFR calc non Af Amer: 74 mL/min — ABNORMAL LOW (ref >90)
Glucose, Bld: 121 mg/dL — ABNORMAL HIGH (ref 70–99)
POTASSIUM: 4.2 meq/L (ref 3.7–5.3)
Sodium: 139 mEq/L (ref 137–147)
Total Protein: 6.3 g/dL (ref 6.0–8.3)

## 2013-05-16 MED ORDER — ONDANSETRON HCL 4 MG PO TABS: 4.0000 mg | ORAL_TABLET | Freq: Four times a day (QID) | ORAL | Status: DC | PRN

## 2013-05-16 MED ORDER — MECLIZINE HCL 12.5 MG PO TABS
25.0000 mg | ORAL_TABLET | Freq: Three times a day (TID) | ORAL | Status: DC
  Administered 2013-05-16 – 2013-05-18 (×8): 25 mg via ORAL
  Filled 2013-05-16 (×8): qty 2

## 2013-05-16 MED ORDER — MECLIZINE HCL 12.5 MG PO TABS
ORAL_TABLET | ORAL | Status: AC
  Filled 2013-05-16: qty 1

## 2013-05-16 MED ORDER — LORAZEPAM 2 MG/ML IJ SOLN
INTRAMUSCULAR | Status: AC
  Filled 2013-05-16: qty 1

## 2013-05-16 MED ORDER — LORAZEPAM 2 MG/ML IJ SOLN
1.0000 mg | Freq: Once | INTRAMUSCULAR | Status: AC
  Administered 2013-05-16: 1 mg via INTRAVENOUS

## 2013-05-16 MED ORDER — CYCLOBENZAPRINE HCL 10 MG PO TABS
10.0000 mg | ORAL_TABLET | Freq: Three times a day (TID) | ORAL | Status: DC
  Administered 2013-05-16: 10 mg via ORAL
  Filled 2013-05-16: qty 1

## 2013-05-16 MED ORDER — MECLIZINE HCL 12.5 MG PO TABS
25.0000 mg | ORAL_TABLET | Freq: Once | ORAL | Status: AC
  Administered 2013-05-16: 25 mg via ORAL

## 2013-05-16 MED ORDER — KETOROLAC TROMETHAMINE 15 MG/ML IJ SOLN
30.0000 mg | Freq: Four times a day (QID) | INTRAMUSCULAR | Status: DC
  Administered 2013-05-16 – 2013-05-17 (×6): 30 mg via INTRAVENOUS
  Filled 2013-05-16 (×6): qty 2

## 2013-05-16 MED ORDER — GADOBENATE DIMEGLUMINE 529 MG/ML IV SOLN
19.0000 mL | Freq: Once | INTRAVENOUS | Status: AC | PRN
  Administered 2013-05-16: 19 mL via INTRAVENOUS

## 2013-05-16 MED ORDER — HYDROCODONE-ACETAMINOPHEN 5-325 MG PO TABS: 1.0000 | ORAL_TABLET | ORAL | Status: DC | PRN

## 2013-05-16 MED ORDER — ONDANSETRON HCL 4 MG/2ML IJ SOLN: 4.0000 mg | Freq: Four times a day (QID) | INTRAMUSCULAR | Status: DC | PRN

## 2013-05-16 MED ORDER — SODIUM CHLORIDE 0.9 % IV SOLN
INTRAVENOUS | Status: DC
  Administered 2013-05-16 – 2013-05-17 (×4): via INTRAVENOUS

## 2013-05-16 MED ORDER — SODIUM CHLORIDE 0.9 % IV SOLN
500.0000 mg | Freq: Once | INTRAVENOUS | Status: AC
  Administered 2013-05-16: 500 mg via INTRAVENOUS
  Filled 2013-05-16: qty 4
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
## 2013-05-16 NOTE — ED Notes (Signed)
Attempted to ambulate patient but patient was dizzy-headed and unable to take one step. Placed him back in bed and notified  Dr. Roxanne Mins.

## 2013-05-17 ENCOUNTER — Encounter (HOSPITAL_COMMUNITY): Payer: Self-pay | Admitting: Internal Medicine

## 2013-05-17 DIAGNOSIS — M5410 Radiculopathy, site unspecified: Secondary | ICD-10-CM | POA: Diagnosis present

## 2013-05-17 DIAGNOSIS — IMO0002 Reserved for concepts with insufficient information to code with codable children: Secondary | ICD-10-CM

## 2013-05-17 LAB — CBC
HCT: 39.7 % (ref 39.0–52.0)
Hemoglobin: 14.1 g/dL (ref 13.0–17.0)
MCH: 30.4 pg (ref 26.0–34.0)
MCHC: 35.5 g/dL (ref 30.0–36.0)
MCV: 85.6 fL (ref 78.0–100.0)
Platelets: 205 10*3/uL (ref 150–400)
RBC: 4.64 MIL/uL (ref 4.22–5.81)
RDW: 12.8 % (ref 11.5–15.5)
WBC: 9.5 10*3/uL (ref 4.0–10.5)

## 2013-05-17 LAB — BASIC METABOLIC PANEL
BUN: 20 mg/dL (ref 6–23)
CHLORIDE: 108 meq/L (ref 96–112)
CO2: 22 mEq/L (ref 19–32)
CREATININE: 1.16 mg/dL (ref 0.50–1.35)
Calcium: 9 mg/dL (ref 8.4–10.5)
GFR calc non Af Amer: 76 mL/min — ABNORMAL LOW (ref >90)
GFR, EST AFRICAN AMERICAN: 88 mL/min — AB (ref >90)
Glucose, Bld: 144 mg/dL — ABNORMAL HIGH (ref 70–99)
Potassium: 4.4 mEq/L (ref 3.7–5.3)
Sodium: 141 mEq/L (ref 137–147)

## 2013-05-17 MED ORDER — ACETAMINOPHEN 325 MG PO TABS
650.0000 mg | ORAL_TABLET | Freq: Four times a day (QID) | ORAL | Status: DC | PRN
  Administered 2013-05-17 – 2013-05-18 (×2): 650 mg via ORAL
  Filled 2013-05-17 (×2): qty 2

## 2013-05-17 MED ORDER — ALPRAZOLAM 0.25 MG PO TABS
0.2500 mg | ORAL_TABLET | Freq: Two times a day (BID) | ORAL | Status: DC | PRN
  Administered 2013-05-17: 0.25 mg via ORAL
  Filled 2013-05-17: qty 1

## 2013-05-17 MED ORDER — PANTOPRAZOLE SODIUM 40 MG PO TBEC
40.0000 mg | DELAYED_RELEASE_TABLET | Freq: Every day | ORAL | Status: DC
  Administered 2013-05-17 – 2013-05-18 (×2): 40 mg via ORAL
  Filled 2013-05-17 (×2): qty 1
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
## 2013-05-17 NOTE — Progress Notes (Signed)
Patient states he takes Omeprazole at home and cannot eat without taking something.  Patient does not have anything ordered for acid reflux.  Dr. Sarajane Jews notified via text page.

## 2013-05-17 NOTE — Progress Notes (Addendum)
1306 - Patient complains of his "heart beating fast".  Patient resting in bed.  Cervical collar in place.  BP 137/76.  Pulse 106-114.  O2 sats on RA 98%.  Patient was previously sitting in the chair by the window.  Stated that his heart started beating fast while he was sitting in the chair and that he got up and into the bed.  Patient has towel wrapped around shoulders.  Asked patient if he felt warm.  Patient stated that he had felt cool and put the towel around shoulders to warm up.  Dyanne Carrel, NP notified via text page.  Returned page and stated to go ahead with Toradol scheduled for patient now and to ambulate patient.

## 2013-05-17 NOTE — Progress Notes (Signed)
Patient c/o of headache.  Does not have PRN pain medication.  Dr. Sarajane Jews notified.
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
## 2013-05-17 NOTE — Care Management Note (Signed)
    Page 1 of 1   05/17/2013     2:50:54 PM   CARE MANAGEMENT NOTE 05/17/2013  Patient:  Zachary Nash, Zachary Nash   Account Number:  [id number]  Date Initiated:  05/17/2013  Documentation initiated by:  Claretha Cooper  Subjective/Objective Assessment:   Pt from home where he lives with his wife. CM asked to speak with pt regarding his insurance coverage. Pt states his wife is concerned about cost and insurance coverage. Wife called while CM in room and CM was asked to talk with her. Wife     Action/Plan:   assured that if insurance did not cover cost that [facility] would work out [employer] consultant. Wife did not seem satisfied with conversation. Requested to talk with pt, pt refused.   Anticipated DC Date:  05/18/2013   Anticipated DC Plan:  [place]  CM consult      Choice offered to / List presented to:             Status of service:  Completed, signed off Medicare Important Message given?   (If response is "NO", the following Medicare IM given date fields will be blank) Date Medicare IM given:   Date Additional Medicare IM given:    Discharge Disposition:    Per UR Regulation:    If discussed at Long Length of Stay Meetings, dates discussed:    Comments:  05/17/13 Claretha Cooper RN BSN CM

## 2013-05-18 DIAGNOSIS — G44309 Post-traumatic headache, unspecified, not intractable: Secondary | ICD-10-CM | POA: Diagnosis present

## 2013-05-18 DIAGNOSIS — R739 Hyperglycemia, unspecified: Secondary | ICD-10-CM | POA: Diagnosis present

## 2013-05-18 MED ORDER — MECLIZINE HCL 25 MG PO TABS: 25.0000 mg | ORAL_TABLET | Freq: Three times a day (TID) | ORAL | Status: DC

## 2013-05-18 MED ORDER — MECLIZINE HCL 25 MG PO TABS: 25.0000 mg | ORAL_TABLET | Freq: Three times a day (TID) | ORAL | Status: DC | PRN
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
## 2013-05-18 NOTE — Progress Notes (Signed)
Patient asking for note for work and when he may return to work.  Dr. Sarajane Jews notified via text page.  Patient states his PCP is no longer Dr. Manuella Ghazi and that he see Dr. Delphina Cahill.
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
## 2013-05-18 NOTE — Progress Notes (Signed)
AVS reviewed with patient.  Prescription and work note provided to patient.  NP stated that prescription for apin medication was sent to pharmacy.  Patient aware.  Patient verbalized understanding of discharge instructions, physician follow-up and medications.  Patient provided with information for OP Pt eval/tx.  RN called and left message with referral information.  Patient's IV removed.  Site WNL.  Patient transported by NT via w/c to main entrance for discharge.  Patient stable at time of discharge.

## 2013-05-18 NOTE — Progress Notes (Signed)
Patient c/o of dizziness and headache.  Does not want to walk at this time.

## 2014-03-12 ENCOUNTER — Emergency Department (HOSPITAL_COMMUNITY)
Admission: EM | Admit: 2014-03-12 | Discharge: 2014-03-12 | Disposition: A | Discharge status: Home / Self Care | Payer: BC Managed Care – PPO | Attending: Emergency Medicine | Admitting: Emergency Medicine

## 2014-03-12 ENCOUNTER — Encounter (HOSPITAL_COMMUNITY): Payer: Self-pay | Admitting: Emergency Medicine

## 2014-03-12 DIAGNOSIS — J45909 Unspecified asthma, uncomplicated: Secondary | ICD-10-CM | POA: Insufficient documentation

## 2014-03-12 DIAGNOSIS — Y998 Other external cause status: Secondary | ICD-10-CM | POA: Insufficient documentation

## 2014-03-12 DIAGNOSIS — T518X1A Toxic effect of other alcohols, accidental (unintentional), initial encounter: Secondary | ICD-10-CM | POA: Insufficient documentation

## 2014-03-12 DIAGNOSIS — Y9289 Other specified places as the place of occurrence of the external cause: Secondary | ICD-10-CM | POA: Diagnosis not present

## 2014-03-12 DIAGNOSIS — Y9389 Activity, other specified: Secondary | ICD-10-CM | POA: Diagnosis not present

## 2014-03-12 DIAGNOSIS — Z8739 Personal history of other diseases of the musculoskeletal system and connective tissue: Secondary | ICD-10-CM | POA: Diagnosis not present

## 2014-03-12 DIAGNOSIS — X58XXXA Exposure to other specified factors, initial encounter: Secondary | ICD-10-CM | POA: Diagnosis not present

## 2014-03-12 DIAGNOSIS — T189XXA Foreign body of alimentary tract, part unspecified, initial encounter: Secondary | ICD-10-CM

## 2014-03-12 DIAGNOSIS — Z79899 Other long term (current) drug therapy: Secondary | ICD-10-CM | POA: Insufficient documentation

## 2014-03-12 LAB — COMPREHENSIVE METABOLIC PANEL
ALBUMIN: 4.3 g/dL (ref 3.5–5.2)
ALK PHOS: 79 U/L (ref 39–117)
ALT: 48 U/L (ref 0–53)
ALT: 42 U/L (ref 0–53)
AST: 39 U/L — ABNORMAL HIGH (ref 0–37)
AST: 31 U/L (ref 0–37)
Albumin: 3.7 g/dL (ref 3.5–5.2)
Alkaline Phosphatase: 68 U/L (ref 39–117)
Anion gap: 17 — ABNORMAL HIGH (ref 5–15)
Anion gap: 11 (ref 5–15)
BUN: 17 mg/dL (ref 6–23)
BUN: 15 mg/dL (ref 6–23)
CALCIUM: 8.6 mg/dL (ref 8.4–10.5)
CO2: 18 mEq/L — ABNORMAL LOW (ref 19–32)
CO2: 23 meq/L (ref 19–32)
CREATININE: 1.37 mg/dL — AB (ref 0.50–1.35)
Calcium: 9.4 mg/dL (ref 8.4–10.5)
Chloride: 107 mEq/L (ref 96–112)
Chloride: 108 mEq/L (ref 96–112)
Creatinine, Ser: 1.34 mg/dL (ref 0.50–1.35)
GFR calc Af Amer: 74 mL/min — ABNORMAL LOW (ref >90)
GFR calc non Af Amer: 64 mL/min — ABNORMAL LOW (ref >90)
GFR calc non Af Amer: 62 mL/min — ABNORMAL LOW (ref >90)
GFR, EST AFRICAN AMERICAN: 72 mL/min — AB (ref >90)
Glucose, Bld: 104 mg/dL — ABNORMAL HIGH (ref 70–99)
Glucose, Bld: 100 mg/dL — ABNORMAL HIGH (ref 70–99)
POTASSIUM: 3.9 meq/L (ref 3.7–5.3)
Potassium: 3.6 mEq/L — ABNORMAL LOW (ref 3.7–5.3)
Sodium: 142 mEq/L (ref 137–147)
Sodium: 142 mEq/L (ref 137–147)
Total Bilirubin: 0.7 mg/dL (ref 0.3–1.2)
Total Bilirubin: 0.7 mg/dL (ref 0.3–1.2)
Total Protein: 7.4 g/dL (ref 6.0–8.3)
Total Protein: 6.4 g/dL (ref 6.0–8.3)

## 2014-03-12 MED ORDER — ONDANSETRON HCL 4 MG/2ML IJ SOLN
4.0000 mg | Freq: Once | INTRAMUSCULAR | Status: AC
  Administered 2014-03-12: 4 mg via INTRAVENOUS
  Filled 2014-03-12: qty 2

## 2014-03-12 MED ORDER — SODIUM CHLORIDE 0.9 % IV SOLN
Freq: Once | INTRAVENOUS | Status: AC
  Administered 2014-03-12: 11:00:00 via INTRAVENOUS

## 2014-03-12 MED ORDER — ONDANSETRON HCL 4 MG/2ML IJ SOLN
INTRAMUSCULAR | Status: AC
Start: 2014-03-12 — End: 2014-03-12
  Administered 2014-03-12: 4 mg via INTRAVENOUS
  Filled 2014-03-12: qty 2

## 2014-03-12 MED ORDER — ONDANSETRON HCL 4 MG/2ML IJ SOLN
4.0000 mg | Freq: Once | INTRAMUSCULAR | Status: AC
  Administered 2014-03-12: 4 mg via INTRAVENOUS

## 2014-03-12 NOTE — ED Provider Notes (Signed)
CSN: [id number]     Arrival date & time 03/12/14  1011 History  This chart was scribed for [name], MD by [name], ED Scribe. This patient was seen in room APA07/APA07 and the patient's care was started at 11:20 AM.    No chief complaint on file.   The history is provided by the patient. No language interpreter was used.     HPI Comments: Zachary Nash is a 43 y.o. male who presents to the Emergency Department for ingestion of 100% antifreeze that gushed out when the container tipped over on his tractor truck. He states that he was trying to stop the gushing, which occurred at about eye level, but the pressure was too high and he swallowed a mouthful of anti-freeze. Patient confirms he has been vomiting frequently.   Past Medical History  Diagnosis Date  . Asthma   . Radicular pain of left lower extremity    Past Surgical History  Procedure Laterality Date  . Cholecystectomy     Family History  Problem Relation Age of Onset  . Heart failure Father    History  Substance Use Topics  . Smoking status: Never Smoker   . Smokeless tobacco: Not on file  . Alcohol Use: No    Review of Systems  Constitutional: Negative for appetite change and fatigue.  HENT: Negative for congestion and sinus pressure.   Eyes: Negative for discharge.  Respiratory: Negative for cough.   Cardiovascular: Negative for chest pain.  Gastrointestinal: Positive for nausea and vomiting. Negative for abdominal pain and diarrhea.  Genitourinary: Negative for frequency and hematuria.  Musculoskeletal: Negative for back pain.  Skin: Negative for rash.  Neurological: Negative for seizures and headaches.  Psychiatric/Behavioral: Negative for hallucinations.      Allergies  Penicillins  Home Medications   Prior to Admission medications   Medication Sig Start Date End Date Taking? Authorizing Provider  acetaminophen (TYLENOL) 500 MG tablet Take 1,000 mg by mouth daily as needed.     Historical Provider, MD  albuterol (PROVENTIL HFA;VENTOLIN HFA) 108 (90 BASE) MCG/ACT inhaler Inhale 2 puffs into the lungs every 6 (six) hours as needed for wheezing.    Historical Provider, MD  HYDROcodone-acetaminophen (NORCO/VICODIN) 5-325 MG per tablet Take 1 tablet by mouth every 6 (six) hours as needed for moderate pain. 05/15/13   [name], MD  ibuprofen (ADVIL,MOTRIN) 200 MG tablet Take 400 mg by mouth daily as needed for moderate pain.    Historical Provider, MD  meclizine (ANTIVERT) 25 MG tablet Take 1 tablet (25 mg total) by mouth 3 (three) times daily as needed for dizziness. 05/18/13   Radene Gunning, NP  Multiple Vitamins-Minerals (ALIVE MENS ENERGY PO) Take 1 tablet by mouth daily.    Historical Provider, MD  omeprazole (PRILOSEC) 20 MG capsule Take 20 mg by mouth daily.    Historical Provider, MD   There were no vitals taken for this visit. Physical Exam  Constitutional: He is oriented to person, place, and time. He appears well-developed.  Normal exam.  HENT:  Head: Normocephalic.  Eyes: Conjunctivae and EOM are normal. No scleral icterus.  Neck: Neck supple. No thyromegaly present.  Cardiovascular: Normal rate and regular rhythm.  Exam reveals no gallop and no friction rub.   No murmur heard. Pulmonary/Chest: No stridor. He has no wheezes. He has no rales. He exhibits no tenderness.  Abdominal: He exhibits no distension. There is no tenderness. There is no rebound.  Musculoskeletal: Normal range  of motion. He exhibits no edema.  Lymphadenopathy:    He has no cervical adenopathy.  Neurological: He is oriented to person, place, and time. He exhibits normal muscle tone. Coordination normal.  Skin: No rash noted. No erythema.  Psychiatric: He has a normal mood and affect. His behavior is normal.  Nursing note and vitals reviewed.   ED Course  Procedures (including critical care time)  DIAGNOSTIC STUDIES: Oxygen Saturation is 99% on room air, normal by my  interpretation.    COORDINATION OF CARE: 11:21 AM - Discussed treatment plan with pt at bedside which includes blood tests and pt agreed to plan.   Labs Review Labs Reviewed  COMPREHENSIVE METABOLIC PANEL  COMPREHENSIVE METABOLIC PANEL    Imaging Review No results found.   EKG Interpretation None      MDM   Final diagnoses:  None    Antifreeze ingestion    [name], MD 03/14/14 [phone]

## 2014-03-12 NOTE — ED Notes (Signed)
Gave patient ice water and ice chips as requested and approved by MD.

## 2014-03-12 NOTE — Discharge Instructions (Signed)
Drink plenty of fluids and follow up as needed

## 2014-03-12 NOTE — ED Notes (Signed)
Called to patient's room with complaints of vomiting. Patient states "I do not want phenergan because it will knock me out." Advised Dr Roderic Palau. Verbal order for zofran 4 mg, IV once and approval for patient to have drink.

## 2014-03-12 NOTE — ED Notes (Signed)
Gave patient ice water and ice chips as requested.

## 2014-03-12 NOTE — ED Notes (Signed)
Called Poison Control and spoke with Malachy Mood to give patient update. Patient lying in bed awake at this time. States "My throat just burns a little bit." No acute distress noted. Patient to be discharged via EDP.

## 2014-03-12 NOTE — ED Notes (Signed)
Pt reports putting antifreeze in his truck, container turned over and pt swallowed a mouth full of antifreeze.

## 2014-03-12 NOTE — ED Notes (Signed)
Gave patient ice cream as requested and approved by Dr Dewayne Hatch.

## 2014-03-12 NOTE — ED Notes (Signed)
Spoke with Zachary Nash at [facility].  Recommended CMP in 6 hours to r/o metabolic acidosis. Watch for n/v.

## 2014-12-09 ENCOUNTER — Encounter (HOSPITAL_COMMUNITY): Payer: Self-pay | Admitting: *Deleted

## 2014-12-09 ENCOUNTER — Emergency Department (HOSPITAL_COMMUNITY)
Admission: EM | Admit: 2014-12-09 | Discharge: 2014-12-10 | Disposition: A | Discharge status: Home / Self Care | Payer: BLUE CROSS/BLUE SHIELD | Attending: Emergency Medicine | Admitting: Emergency Medicine

## 2014-12-09 DIAGNOSIS — Z9049 Acquired absence of other specified parts of digestive tract: Secondary | ICD-10-CM | POA: Insufficient documentation

## 2014-12-09 DIAGNOSIS — Z880 Allergy status to penicillin: Secondary | ICD-10-CM | POA: Diagnosis not present

## 2014-12-09 DIAGNOSIS — R1032 Left lower quadrant pain: Secondary | ICD-10-CM | POA: Diagnosis not present

## 2014-12-09 DIAGNOSIS — Z79899 Other long term (current) drug therapy: Secondary | ICD-10-CM | POA: Insufficient documentation

## 2014-12-09 DIAGNOSIS — R109 Unspecified abdominal pain: Secondary | ICD-10-CM | POA: Diagnosis present

## 2014-12-09 DIAGNOSIS — R112 Nausea with vomiting, unspecified: Secondary | ICD-10-CM | POA: Insufficient documentation

## 2014-12-09 DIAGNOSIS — J45909 Unspecified asthma, uncomplicated: Secondary | ICD-10-CM | POA: Diagnosis not present

## 2014-12-09 DIAGNOSIS — Z87442 Personal history of urinary calculi: Secondary | ICD-10-CM | POA: Diagnosis not present

## 2014-12-09 MED ORDER — SODIUM CHLORIDE 0.9 % IV BOLUS (SEPSIS)
1000.0000 mL | Freq: Once | INTRAVENOUS | Status: AC
  Administered 2014-12-10: 1000 mL via INTRAVENOUS

## 2014-12-09 MED ORDER — MORPHINE SULFATE (PF) 4 MG/ML IV SOLN
4.0000 mg | Freq: Once | INTRAVENOUS | Status: AC
  Administered 2014-12-10: 4 mg via INTRAVENOUS
  Filled 2014-12-09: qty 1

## 2014-12-09 MED ORDER — ONDANSETRON HCL 4 MG/2ML IJ SOLN
4.0000 mg | Freq: Once | INTRAMUSCULAR | Status: AC
  Administered 2014-12-10: 4 mg via INTRAMUSCULAR
  Filled 2014-12-09: qty 2

## 2014-12-09 NOTE — ED Provider Notes (Signed)
CSN: [id number]   Arrival date & time 12/09/14 2251  History  This chart was scribed for Merryl Hacker, MD by Altamease Oiler, ED Scribe. This patient was seen in room APA12/APA12 and the patient's care was started at 11:44 PM.  Chief Complaint  Patient presents with  . Abdominal Pain    HPI The history is provided by the patient. No language interpreter was used.   Zachary Nash is a 44 y.o. male who presents to the Emergency Department complaining of new and intermittent left-sided abdominal pain with onset this afternoon. The pain is described as squeezing and rated 9/10 in severity with no modifying factors. Pt states that this pain is more severe than pain that he has had in the past with kidney stones. Associated symptoms include nausea and vomiting. Pt denies diarrhea, hematochezia, and fever. Last normal bowel movement was 2 hours ago.  He has never had a colonoscopy.   Past Medical History  Diagnosis Date  . Asthma   . Radicular pain of left lower extremity     Past Surgical History  Procedure Laterality Date  . Cholecystectomy      Family History  Problem Relation Age of Onset  . Heart failure Father     Social History  Substance Use Topics  . Smoking status: Never Smoker   . Smokeless tobacco: None  . Alcohol Use: No     Review of Systems  Constitutional: Negative.  Negative for fever.  Respiratory: Negative.  Negative for chest tightness and shortness of breath.   Cardiovascular: Negative.  Negative for chest pain.  Gastrointestinal: Positive for nausea, vomiting and abdominal pain. Negative for diarrhea and constipation.  Genitourinary: Negative.  Negative for dysuria, hematuria and flank pain.  Musculoskeletal: Negative for back pain.  Neurological: Negative for headaches.  All other systems reviewed and are negative.  Home Medications   Prior to Admission medications   Medication Sig Start Date End Date Taking? Authorizing Provider  acetaminophen  (TYLENOL) 500 MG tablet Take 1,000 mg by mouth daily as needed (pain).    Yes Historical Provider, MD  albuterol (PROVENTIL HFA;VENTOLIN HFA) 108 (90 BASE) MCG/ACT inhaler Inhale 2 puffs into the lungs every 6 (six) hours as needed for wheezing.   Yes Historical Provider, MD  diphenhydrAMINE (SOMINEX) 25 MG tablet Take 25 mg by mouth at bedtime as needed for sleep.   Yes Historical Provider, MD  ibuprofen (ADVIL,MOTRIN) 200 MG tablet Take 400 mg by mouth daily as needed for moderate pain.   Yes Historical Provider, MD  Multiple Vitamins-Minerals (ALIVE MENS ENERGY PO) Take 1 tablet by mouth daily.   Yes Historical Provider, MD  omeprazole (PRILOSEC) 20 MG capsule Take 20 mg by mouth daily.   Yes Historical Provider, MD  buPROPion (WELLBUTRIN) 75 MG tablet Take 75 mg by mouth at bedtime.    Historical Provider, MD  HYDROcodone-acetaminophen (NORCO/VICODIN) 5-325 MG per tablet Take 1 tablet by mouth every 6 (six) hours as needed. 12/10/14   Merryl Hacker, MD  meclizine (ANTIVERT) 25 MG tablet Take 1 tablet (25 mg total) by mouth 3 (three) times daily as needed for dizziness. 05/18/13   Radene Gunning, NP  ondansetron (ZOFRAN ODT) 4 MG disintegrating tablet Take 1 tablet (4 mg total) by mouth every 8 (eight) hours as needed for nausea or vomiting. 12/10/14   Merryl Hacker, MD    Allergies  Penicillins  Triage Vitals: BP 132/84 mmHg  Pulse 110  Temp(Src) 98.2 F (36.8  C) (Oral)  Resp 16  Ht 5' 11"  (1.803 m)  Wt 196 lb 5 oz (89.047 kg)  BMI 27.39 kg/m2  SpO2 100%  Physical Exam  Constitutional: He is oriented to person, place, and time. He appears well-developed and well-nourished.  Uncomfortable appearing but no acute distress  HENT:  Head: Normocephalic and atraumatic.  Cardiovascular: Normal rate, regular rhythm and normal heart sounds.   No murmur heard. Pulmonary/Chest: Effort normal and breath sounds normal. No respiratory distress. He has no wheezes.  Abdominal: Soft. Bowel  sounds are normal. There is tenderness. There is no rebound and no guarding.  Tenderness palpation over the left mid and left lower quadrant without rebound or guarding  Musculoskeletal: He exhibits no edema.  Neurological: He is alert and oriented to person, place, and time.  Skin: Skin is warm and dry.  Psychiatric: He has a normal mood and affect.  Nursing note and vitals reviewed.   ED Course  Procedures   DIAGNOSTIC STUDIES: Oxygen Saturation is 100% on RA, normal by my interpretation.    COORDINATION OF CARE: 11:49 PM Discussed treatment plan which includes lab work with pt at bedside and pt agreed to plan.  Labs Reviewed  CBC WITH DIFFERENTIAL/PLATELET - Abnormal; Notable for the following:    Monocytes Relative 13 (*)    All other components within normal limits  COMPREHENSIVE METABOLIC PANEL - Abnormal; Notable for the following:    Potassium 3.2 (*)    Glucose, Bld 122 (*)    BUN 21 (*)    Calcium 8.6 (*)    All other components within normal limits  LIPASE, BLOOD - Abnormal; Notable for the following:    Lipase 18 (*)    All other components within normal limits  URINALYSIS, ROUTINE W REFLEX MICROSCOPIC (NOT AT [facility])    Imaging Review Ct Abdomen Pelvis W Contrast  12/10/2014   CLINICAL DATA:  44 year old male with left lower quadrant abdominal pain  EXAM: CT ABDOMEN AND PELVIS WITH CONTRAST  TECHNIQUE: Multidetector CT imaging of the abdomen and pelvis was performed using the standard protocol following bolus administration of intravenous contrast.  CONTRAST:  80m OMNIPAQUE IOHEXOL 300 MG/ML SOLN, 1028mOMNIPAQUE IOHEXOL 300 MG/ML SOLN  COMPARISON:  CT dated 01/20/2005  FINDINGS: The visualized lung bases are clear. No intra-abdominal free air or free fluid.  Cholecystectomy. The liver, pancreas, spleen, adrenal glands, kidneys, visualized ureters appear unremarkable. The urinary bladder is predominantly collapsed. The prostate and seminal vesicles are grossly  unremarkable.  Oral contrast is noted within the stomach. There is no evidence of bowel obstruction or inflammation. The appendix is unremarkable.  The abdominal aorta and IVC appear patent. No portal venous gas identified. There is no lymphadenopathy. There is a small fat containing umbilical hernia. Mild degenerative changes of the spine. No acute fracture.  IMPRESSION: No acute intra-abdominal or pelvic pathology. No evidence of bowel obstruction or inflammation.   Electronically Signed   By: ArAnner Crete.D.   On: 12/10/2014 03:35      MDM   Final diagnoses:  Left lower quadrant pain   Patient presents with abdominal pain. Uncomfortable appearing but nontoxic.  Tenderness palpation left mid lower quadrant without rebound or guarding. Patient given pain and nausea medication.  Lab work obtained and largely reassuring. On repeat exam, patient is resting comfortably; however with repeat abdominal exam he continues to have abdominal pain. Will obtain CT to rule out diverticulitis.  Kidney stones is also a consideration; however with reproducible  tenderness on exam this is less likely.  CT is reassuring. On recheck, patient continues to report pain. He has no signs of peritonitis. Given vomiting, he may have early gastritis versus gastroenteritis. Discussed with patient and his family possibilities and results. Will discharge with pain and nausea medication. Follow-up with PCP if symptoms do not resolve.  After history, exam, and medical workup I feel the patient has been appropriately medically screened and is safe for discharge home. Pertinent diagnoses were discussed with the patient. Patient was given return precautions.  I personally performed the services described in this documentation, which was scribed in my presence. The recorded information has been reviewed and is accurate.    Merryl Hacker, MD 12/10/14 [phone]

## 2014-12-09 NOTE — ED Notes (Addendum)
Pt c/o abd pain described as a cramping, nausea, vomiting that started before he had eaten a steak and cheese sandwich this evening, denies diarrhea but reports that he did have a bowel movement and it came "fast"

## 2014-12-10 ENCOUNTER — Emergency Department (HOSPITAL_COMMUNITY): Payer: BLUE CROSS/BLUE SHIELD

## 2014-12-10 ENCOUNTER — Encounter (HOSPITAL_COMMUNITY): Payer: Self-pay | Admitting: Radiology

## 2014-12-10 LAB — CBC WITH DIFFERENTIAL/PLATELET
BASOS ABS: 0 10*3/uL (ref 0.0–0.1)
Basophils Relative: 1 % (ref 0–1)
EOS PCT: 3 % (ref 0–5)
Eosinophils Absolute: 0.2 10*3/uL (ref 0.0–0.7)
HEMATOCRIT: 42 % (ref 39.0–52.0)
Hemoglobin: 14.8 g/dL (ref 13.0–17.0)
LYMPHS PCT: 22 % (ref 12–46)
Lymphs Abs: 1.5 10*3/uL (ref 0.7–4.0)
MCH: 29.8 pg (ref 26.0–34.0)
MCHC: 35.2 g/dL (ref 30.0–36.0)
MCV: 84.5 fL (ref 78.0–100.0)
Monocytes Absolute: 0.8 10*3/uL (ref 0.1–1.0)
Monocytes Relative: 13 % — ABNORMAL HIGH (ref 3–12)
NEUTROS ABS: 4.1 10*3/uL (ref 1.7–7.7)
Neutrophils Relative %: 61 % (ref 43–77)
PLATELETS: 229 10*3/uL (ref 150–400)
RBC: 4.97 MIL/uL (ref 4.22–5.81)
RDW: 12.9 % (ref 11.5–15.5)
WBC: 6.6 10*3/uL (ref 4.0–10.5)

## 2014-12-10 LAB — COMPREHENSIVE METABOLIC PANEL
ALT: 60 U/L (ref 17–63)
AST: 32 U/L (ref 15–41)
Albumin: 3.9 g/dL (ref 3.5–5.0)
Alkaline Phosphatase: 64 U/L (ref 38–126)
Anion gap: 8 (ref 5–15)
BUN: 21 mg/dL — AB (ref 6–20)
CHLORIDE: 110 mmol/L (ref 101–111)
CO2: 22 mmol/L (ref 22–32)
Calcium: 8.6 mg/dL — ABNORMAL LOW (ref 8.9–10.3)
Creatinine, Ser: 1.2 mg/dL (ref 0.61–1.24)
GFR calc Af Amer: >60 mL/min (ref >60)
GFR calc non Af Amer: >60 mL/min (ref >60)
GLUCOSE: 122 mg/dL — AB (ref 65–99)
Potassium: 3.2 mmol/L — ABNORMAL LOW (ref 3.5–5.1)
Sodium: 140 mmol/L (ref 135–145)
Total Bilirubin: 0.6 mg/dL (ref 0.3–1.2)
Total Protein: 6.7 g/dL (ref 6.5–8.1)

## 2014-12-10 LAB — URINALYSIS, ROUTINE W REFLEX MICROSCOPIC
Bilirubin Urine: NEGATIVE
GLUCOSE, UA: NEGATIVE mg/dL
HGB URINE DIPSTICK: NEGATIVE
KETONES UR: NEGATIVE mg/dL
LEUKOCYTES UA: NEGATIVE
Nitrite: NEGATIVE
PH: 6.5 (ref 5.0–8.0)
Protein, ur: NEGATIVE mg/dL
Specific Gravity, Urine: 1.025 (ref 1.005–1.030)
Urobilinogen, UA: 0.2 mg/dL (ref 0.0–1.0)

## 2014-12-10 LAB — LIPASE, BLOOD: LIPASE: 18 U/L — AB (ref 22–51)

## 2014-12-10 MED ORDER — ONDANSETRON 4 MG PO TBDP: 4.0000 mg | ORAL_TABLET | Freq: Three times a day (TID) | ORAL | Status: DC | PRN

## 2014-12-10 MED ORDER — DIPHENHYDRAMINE HCL 50 MG/ML IJ SOLN
INTRAMUSCULAR | Status: AC
  Filled 2014-12-10: qty 1

## 2014-12-10 MED ORDER — IOHEXOL 300 MG/ML  SOLN
100.0000 mL | Freq: Once | INTRAMUSCULAR | Status: AC | PRN
  Administered 2014-12-10: 100 mL via INTRAVENOUS

## 2014-12-10 MED ORDER — DIPHENHYDRAMINE HCL 50 MG/ML IJ SOLN
25.0000 mg | Freq: Once | INTRAMUSCULAR | Status: AC
  Administered 2014-12-10: 25 mg via INTRAVENOUS

## 2014-12-10 MED ORDER — HYDROCODONE-ACETAMINOPHEN 5-325 MG PO TABS: 1.0000 | ORAL_TABLET | Freq: Four times a day (QID) | ORAL | Status: DC | PRN

## 2014-12-10 MED ORDER — OXYCODONE-ACETAMINOPHEN 5-325 MG PO TABS
1.0000 | ORAL_TABLET | Freq: Once | ORAL | Status: AC
  Administered 2014-12-10: 1 via ORAL
  Filled 2014-12-10: qty 1

## 2014-12-10 MED ORDER — IOHEXOL 300 MG/ML  SOLN
25.0000 mL | Freq: Once | INTRAMUSCULAR | Status: AC | PRN
  Administered 2014-12-10: 25 mL via ORAL

## 2014-12-10 NOTE — Progress Notes (Signed)
PT complained of iching on anterior chest, Shera RTR and I lifted up gown and did not see any visible rash/hives, only red where he was scratching. Shera RTR and I did not see any red/rash/hives on patient's arms, legs, face or neck. I called the ED to speak to RN but she was on the phone with [facility], message relayed to [place] who said she was let the RN know.

## 2014-12-10 NOTE — Discharge Instructions (Signed)
You were seen today for abdominal pain. The exact cause of your pain is unknown at this time; however given the vomiting, you may have a gastritis or viral infection. Her CT scan is reassuring.  Supportive care home including aggressive hydration and pain and nausea medication will be provided. Follow-up with your primary physician if symptoms persist.  Abdominal Pain Many things can cause abdominal pain. Usually, abdominal pain is not caused by a disease and will improve without treatment. It can often be observed and treated at home. Your health care provider will do a physical exam and possibly order blood tests and X-rays to help determine the seriousness of your pain. However, in many cases, more time must pass before a clear cause of the pain can be found. Before that point, your health care provider may not know if you need more testing or further treatment. HOME CARE INSTRUCTIONS  Monitor your abdominal pain for any changes. The following actions may help to alleviate any discomfort you are experiencing:  Only take over-the-counter or prescription medicines as directed by your health care provider.  Do not take laxatives unless directed to do so by your health care provider.  Try a clear liquid diet (broth, tea, or water) as directed by your health care provider. Slowly move to a bland diet as tolerated. SEEK MEDICAL CARE IF:  You have unexplained abdominal pain.  You have abdominal pain associated with nausea or diarrhea.  You have pain when you urinate or have a bowel movement.  You experience abdominal pain that wakes you in the night.  You have abdominal pain that is worsened or improved by eating food.  You have abdominal pain that is worsened with eating fatty foods.  You have a fever. SEEK IMMEDIATE MEDICAL CARE IF:   Your pain does not go away within 2 hours.  You keep throwing up (vomiting).  Your pain is felt only in portions of the abdomen, such as the right side  or the left lower portion of the abdomen.  You pass bloody or black tarry stools. MAKE SURE YOU:  Understand these instructions.   Will watch your condition.   Will get help right away if you are not doing well or get worse.  Document Released: 12/24/2004 Document Revised: 03/21/2013 Document Reviewed: 11/23/2012 [facility] Patient Information 2015 [place], [place]. This information is not intended to replace advice given to you by your health care provider. Make sure you discuss any questions you have with your health care provider.

## 2014-12-10 NOTE — ED Notes (Signed)
When given discharge instructions and pain medication, pt stated his pain was a "7 or maybe a 9 and now it burns when I pee, is that normal?" Pt stated he wanted to know if this was due to the dye from the CT scan. I advised him I would speak to the EDP and let her know his complaint and new symptom. Noted that pt had been sleeping when pt entered room and had to be awakened to give ordered medication and discharge instructions.  EDP came back to room and advised pt about his treatment plan and discharge orders. At that time, pt was assisted to get dressed by family, pt was assisted to wheel chair and assisted to car.

## 2014-12-10 NOTE — ED Notes (Signed)
Pt given fluids, tolerated well at this time with no c/o nausea. Pt "burped" after drinking. Family concerned that pt has "kidney stones" because he is still hurting. Pt had to be awakened for fluid challenge.

## 2014-12-10 NOTE — ED Notes (Signed)
Patient resting on stretcher with eyes closed.

## 2016-01-29 ENCOUNTER — Encounter (HOSPITAL_COMMUNITY): Payer: Self-pay

## 2016-01-29 ENCOUNTER — Emergency Department (HOSPITAL_COMMUNITY): Payer: No Typology Code available for payment source

## 2016-01-29 ENCOUNTER — Emergency Department (HOSPITAL_COMMUNITY)
Admission: EM | Admit: 2016-01-29 | Discharge: 2016-01-30 | Disposition: A | Discharge status: Home / Self Care | Payer: No Typology Code available for payment source | Attending: Emergency Medicine | Admitting: Emergency Medicine

## 2016-01-29 DIAGNOSIS — W228XXA Striking against or struck by other objects, initial encounter: Secondary | ICD-10-CM | POA: Insufficient documentation

## 2016-01-29 DIAGNOSIS — S0990XA Unspecified injury of head, initial encounter: Secondary | ICD-10-CM | POA: Insufficient documentation

## 2016-01-29 DIAGNOSIS — Y999 Unspecified external cause status: Secondary | ICD-10-CM | POA: Insufficient documentation

## 2016-01-29 DIAGNOSIS — J45909 Unspecified asthma, uncomplicated: Secondary | ICD-10-CM | POA: Diagnosis not present

## 2016-01-29 DIAGNOSIS — F0781 Postconcussional syndrome: Secondary | ICD-10-CM

## 2016-01-29 DIAGNOSIS — Y929 Unspecified place or not applicable: Secondary | ICD-10-CM | POA: Insufficient documentation

## 2016-01-29 DIAGNOSIS — W19XXXA Unspecified fall, initial encounter: Secondary | ICD-10-CM

## 2016-01-29 DIAGNOSIS — Y939 Activity, unspecified: Secondary | ICD-10-CM | POA: Diagnosis not present

## 2016-01-29 MED ORDER — IBUPROFEN 400 MG PO TABS
400.0000 mg | ORAL_TABLET | Freq: Once | ORAL | Status: AC
  Administered 2016-01-30: 400 mg via ORAL
  Filled 2016-01-29: qty 1

## 2016-01-29 NOTE — ED Notes (Signed)
Spoke to Dr. Gilford Raid about patient. VO placed per Dr. Gilford Raid.

## 2016-01-29 NOTE — ED Notes (Signed)
Pt transported to CT ?

## 2016-01-29 NOTE — ED Triage Notes (Signed)
Patient reports of falling onto rocks from standing position hitting head on rocks. Complains of pain to back of head and neck. Patient states he thinks he "blacked out" for a couple of seconds. Reports of confusion that started this evening. Patient will answer questions although seems to lose concentration. Blurred vision.

## 2016-01-29 NOTE — ED Provider Notes (Signed)
[facility] DEPT Provider Note   CSN: [id number] Arrival date & time: 01/29/16  2209  By signing my name below, I, Dora Sims, attest that this documentation has been prepared under the direction and in the presence of physician practitioner, Virgel Manifold, MD. Electronically Signed: Dora Sims, Scribe. 01/29/2016. 11:19 PM.  History   Chief Complaint Chief Complaint  Patient presents with  . Head Injury    The history is provided by the patient. No language interpreter was used.     HPI Comments: Zachary Nash is a 45 y.o. male brought in by his wife who presents to the Emergency Department with concern for concussion for the last few days. Pt reports he fell 4 days ago and hit his head on rocks; he is unsure if he lost consciousness. He notes some nausea, headaches, disorientation, and neck pain over the last couple of days. He denies neck pain exacerbation with movement. He notes he has had some right foot numbness since the fall. Pt reports he began feeling dizzy this morning and it worsened throughout the day. He states he was standing up and singing tonight at church and started experiencing severe blurry vision. He has tried ibuprofen for his headache and neck pain with minimal relief. Pt uses Omeprazole daily for acid reflux. 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 Pt denies vomiting, weakness, fever, chills, color change, wounds, or any other associated symptoms.  Past Medical History:  Diagnosis Date  . Asthma   . Radicular pain of left lower extremity     Patient Active Problem List   Diagnosis Date Noted  . 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 Hyperglycemia 05/18/2013  . Radicular pain of left lower extremity   . Dizziness 05/16/2013    Past Surgical History:  Procedure Laterality Date  . CHOLECYSTECTOMY         Home Medications    Prior to Admission medications   Medication Sig Start Date End  Date Taking? Authorizing Provider  acetaminophen (TYLENOL) 500 MG tablet Take 1,000 mg by mouth daily as needed (pain).     Historical Provider, MD  albuterol (PROVENTIL HFA;VENTOLIN HFA) 108 (90 BASE) MCG/ACT inhaler Inhale 2 puffs into the lungs every 6 (six) hours as needed for wheezing.    Historical Provider, MD  buPROPion (WELLBUTRIN) 75 MG tablet Take 75 mg by mouth at bedtime.    Historical Provider, MD  diphenhydrAMINE (SOMINEX) 25 MG tablet Take 25 mg by mouth at bedtime as needed for sleep.    Historical Provider, MD  HYDROcodone-acetaminophen (NORCO/VICODIN) 5-325 MG per tablet Take 1 tablet by mouth every 6 (six) hours as needed. 12/10/14   Merryl Hacker, MD  ibuprofen (ADVIL,MOTRIN) 200 MG tablet Take 400 mg by mouth daily as needed for moderate pain.    Historical Provider, MD  meclizine (ANTIVERT) 25 MG tablet Take 1 tablet (25 mg total) by mouth 3 (three) times daily as needed for dizziness. 05/18/13   Radene Gunning, NP  Multiple Vitamins-Minerals (ALIVE MENS ENERGY PO) Take 1 tablet by mouth daily.    Historical Provider, MD  omeprazole (PRILOSEC) 20 MG capsule Take 20 mg by mouth daily.    Historical Provider, MD  ondansetron (ZOFRAN ODT) 4 MG disintegrating tablet Take 1 tablet (4 mg total) by mouth every 8 (eight) hours as needed for nausea or vomiting. 12/10/14   Merryl Hacker, MD    Family History Family History  Problem  Relation Age of Onset  . Heart failure Father     Social History Social History  Substance Use Topics  . Smoking status: Never Smoker  . Smokeless tobacco: Never Used  . Alcohol use No     Allergies   Penicillins   Review of Systems Review of Systems  A complete 10 system review of systems was obtained and all systems are negative except as noted in the HPI and PMH.   Physical Exam Updated Vital Signs BP 138/92 (BP Location: Left Arm)   Pulse 86   Temp 98.9 F (37.2 C) (Oral)   Resp 18   Ht 5' 11"  (1.803 m)   Wt 195 lb (88.5  kg)   SpO2 100%   BMI 27.20 kg/m   Physical Exam  Constitutional: He is oriented to person, place, and time. He appears well-developed and well-nourished. No distress.  HENT:  Head: Normocephalic and atraumatic.  Right Ear: Hearing normal.  Left Ear: Hearing normal.  Nose: Nose normal.  Mouth/Throat: Oropharynx is clear and moist and mucous membranes are normal.  Mild tenderness in occipital region.  Eyes: Conjunctivae and EOM are normal. Pupils are equal, round, and reactive to light.  Neck: Normal range of motion. Neck supple.  Cardiovascular: Regular rhythm, S1 normal and S2 normal.  Exam reveals no gallop and no friction rub.   No murmur heard. Pulmonary/Chest: Effort normal and breath sounds normal. No respiratory distress. He exhibits no tenderness.  Abdominal: Soft. Normal appearance and bowel sounds are normal. There is no hepatosplenomegaly. There is no tenderness. There is no rebound, no guarding, no tenderness at McBurney's point and negative Murphy's sign. No hernia.  Musculoskeletal: Normal range of motion.  Neurological: He is alert and oriented to person, place, and time. He has normal strength and normal reflexes. He displays normal reflexes. No cranial nerve deficit or sensory deficit. He exhibits normal muscle tone. Coordination normal. GCS eye subscore is 4. GCS verbal subscore is 5. GCS motor subscore is 6.  Skin: Skin is warm, dry and intact. No rash noted. No cyanosis.  Psychiatric: He has a normal mood and affect. His speech is normal and behavior is normal. Thought content normal.  Nursing note and vitals reviewed.   ED Treatments / Results  Labs (all labs ordered are listed, but only abnormal results are displayed) Labs Reviewed - No data to display  EKG  EKG Interpretation None       Radiology No results found.   Ct Head Wo Contrast  Result Date: 01/29/2016 CLINICAL DATA:  Ground level fall, striking head on rocks 4 days ago. Confusion tonight.  EXAM: CT HEAD WITHOUT CONTRAST CT CERVICAL SPINE WITHOUT CONTRAST TECHNIQUE: Multidetector CT imaging of the head and cervical spine was performed following the standard protocol without intravenous contrast. Multiplanar CT image reconstructions of the cervical spine were also generated. COMPARISON:  05/16/2013 FINDINGS: CT HEAD FINDINGS Brain: No evidence of acute infarction, hemorrhage, hydrocephalus, extra-axial collection or mass lesion/mass effect. Gray matter and white matter are unremarkable, with normal differentiation. Brain volume is normal for age. Vascular: No hyperdense vessel or unexpected calcification. Skull: Normal. Negative for fracture or focal lesion. Sinuses/Orbits: No acute finding. CT CERVICAL SPINE FINDINGS Alignment: Normal. Skull base and vertebrae: No acute fracture. No primary bone lesion or focal pathologic process. Soft tissues and spinal canal: No prevertebral fluid or swelling. No visible canal hematoma. Disc levels: Mild degenerative cervical disc disease, greatest from C4 through C6. Mild facet arthritis, greatest on the right  at C7-T1. Upper chest: No significant abnormality. IMPRESSION: 1. Normal brain 2. Negative for cervical spine fracture. Mild degenerative disc and facet changes, typical for age. Electronically Signed   By: Andreas Newport M.D.   On: 01/29/2016 23:31   Ct Cervical Spine Wo Contrast  Result Date: 01/29/2016 CLINICAL DATA:  Ground level fall, striking head on rocks 4 days ago. Confusion tonight. EXAM: CT HEAD WITHOUT CONTRAST CT CERVICAL SPINE WITHOUT CONTRAST TECHNIQUE: Multidetector CT imaging of the head and cervical spine was performed following the standard protocol without intravenous contrast. Multiplanar CT image reconstructions of the cervical spine were also generated. COMPARISON:  05/16/2013 FINDINGS: CT HEAD FINDINGS Brain: No evidence of acute infarction, hemorrhage, hydrocephalus, extra-axial collection or mass lesion/mass effect. Gray matter  and white matter are unremarkable, with normal differentiation. Brain volume is normal for age. Vascular: No hyperdense vessel or unexpected calcification. Skull: Normal. Negative for fracture or focal lesion. Sinuses/Orbits: No acute finding. CT CERVICAL SPINE FINDINGS Alignment: Normal. Skull base and vertebrae: No acute fracture. No primary bone lesion or focal pathologic process. Soft tissues and spinal canal: No prevertebral fluid or swelling. No visible canal hematoma. Disc levels: Mild degenerative cervical disc disease, greatest from C4 through C6. Mild facet arthritis, greatest on the right at C7-T1. Upper chest: No significant abnormality. IMPRESSION: 1. Normal brain 2. Negative for cervical spine fracture. Mild degenerative disc and facet changes, typical for age. Electronically Signed   By: Andreas Newport M.D.   On: 01/29/2016 23:31    Procedures Procedures (including critical care time)  DIAGNOSTIC STUDIES: Oxygen Saturation is 100% on RA, normal by my interpretation.    COORDINATION OF CARE: 11:24 PM Discussed treatment plan with pt at bedside and pt agreed to plan.  Medications Ordered in ED Medications - No data to display   Initial Impression / Assessment and Plan / ED Course  I have reviewed the triage vital signs and the nursing notes.  Pertinent labs & imaging results that were available during my care of the patient were reviewed by me and considered in my medical decision making (see chart for details).  Clinical Course    45 year old male with likely postconcussive syndrome. His neuro exam is nonfocal. CT of the head and cervical spine without acute abnormality. It has been determined that no acute conditions requiring further emergency intervention are present at this time. The patient has been advised of the diagnosis and plan. I reviewed any labs and imaging including any potential incidental findings. We have discussed signs and symptoms that warrant return to  the ED and they are listed in the discharge instructions.    Final Clinical Impressions(s) / ED Diagnoses   Final diagnoses:  Fall  Post concussion syndrome    New Prescriptions New Prescriptions   No medications on file     Virgel Manifold, MD 02/03/16 1224

## 2016-01-31 ENCOUNTER — Emergency Department (HOSPITAL_COMMUNITY)
Admission: EM | Admit: 2016-01-31 | Discharge: 2016-01-31 | Disposition: A | Discharge status: Home / Self Care | Payer: No Typology Code available for payment source | Attending: Emergency Medicine | Admitting: Emergency Medicine

## 2016-01-31 DIAGNOSIS — Z79899 Other long term (current) drug therapy: Secondary | ICD-10-CM | POA: Insufficient documentation

## 2016-01-31 DIAGNOSIS — Z791 Long term (current) use of non-steroidal anti-inflammatories (NSAID): Secondary | ICD-10-CM | POA: Insufficient documentation

## 2016-01-31 DIAGNOSIS — F419 Anxiety disorder, unspecified: Secondary | ICD-10-CM | POA: Insufficient documentation

## 2016-01-31 DIAGNOSIS — J45909 Unspecified asthma, uncomplicated: Secondary | ICD-10-CM | POA: Diagnosis not present

## 2016-01-31 DIAGNOSIS — F0781 Postconcussional syndrome: Secondary | ICD-10-CM | POA: Insufficient documentation

## 2016-01-31 DIAGNOSIS — R51 Headache: Secondary | ICD-10-CM | POA: Diagnosis present

## 2016-01-31 MED ORDER — METHOCARBAMOL 500 MG PO TABS: 1000.0000 mg | ORAL_TABLET | Freq: Three times a day (TID) | ORAL | 0 refills | Status: DC | PRN

## 2016-01-31 MED ORDER — KETOROLAC TROMETHAMINE 60 MG/2ML IM SOLN
60.0000 mg | Freq: Once | INTRAMUSCULAR | Status: AC
  Administered 2016-01-31: 60 mg via INTRAMUSCULAR
  Filled 2016-01-31: qty 2

## 2016-01-31 MED ORDER — METHOCARBAMOL 500 MG PO TABS
1000.0000 mg | ORAL_TABLET | Freq: Once | ORAL | Status: AC
  Administered 2016-01-31: 1000 mg via ORAL
  Filled 2016-01-31: qty 2

## 2016-01-31 MED ORDER — METOCLOPRAMIDE HCL 10 MG PO TABS: 10.0000 mg | ORAL_TABLET | Freq: Four times a day (QID) | ORAL | 0 refills | Status: DC | PRN

## 2016-01-31 MED ORDER — METOCLOPRAMIDE HCL 10 MG PO TABS
10.0000 mg | ORAL_TABLET | Freq: Once | ORAL | Status: AC
  Administered 2016-01-31: 10 mg via ORAL
  Filled 2016-01-31: qty 1

## 2016-01-31 MED ORDER — IBUPROFEN 600 MG PO TABS: 600.0000 mg | ORAL_TABLET | Freq: Four times a day (QID) | ORAL | 0 refills | Status: DC | PRN

## 2016-01-31 NOTE — ED Triage Notes (Signed)
Pt felling last Saturday, called family doctor today was told to come back to ED.  Pain getting worse in head, increased sleepiness, tearful for no reason per pt.

## 2016-01-31 NOTE — ED Triage Notes (Signed)
Pt states pain is not that bad, he does not feel normal and feels confused. Continues to do odd things. This morning he threw his keys in the trash.

## 2016-01-31 NOTE — ED Provider Notes (Signed)
[facility] DEPT Provider Note   CSN: [id number] Arrival date & time: 01/31/16  1344     History   Chief Complaint Chief Complaint  Patient presents with  . Headache    HPI Zachary Nash is a 45 y.o. male.  HPI Patient with fall and closed head injury 6 days ago. Was seen 2 days ago for persistent headache. Had CT done of the head and cervical spine. No acute findings. Diagnosed with postconcussive syndrome and discharged. Patient continues to have posterior throbbing headache. States he's had increased sleepiness, confusion, blurred vision and nausea. No focal weakness or numbness. Patient states he's been very anxious.  Past Medical History:  Diagnosis Date  . Asthma   . Radicular pain of left lower extremity     Patient Active Problem List   Diagnosis Date Noted  . 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 Hyperglycemia 05/18/2013  . Radicular pain of left lower extremity   . Dizziness 05/16/2013    Past Surgical History:  Procedure Laterality Date  . CHOLECYSTECTOMY         Home Medications    Prior to Admission medications   Medication Sig Start Date End Date Taking? Authorizing Provider  acetaminophen (TYLENOL) 500 MG tablet Take 1,000 mg by mouth daily as needed (pain).     Historical Provider, MD  albuterol (PROVENTIL HFA;VENTOLIN HFA) 108 (90 BASE) MCG/ACT inhaler Inhale 2 puffs into the lungs every 6 (six) hours as needed for wheezing.    Historical Provider, MD  buPROPion (WELLBUTRIN) 75 MG tablet Take 75 mg by mouth at bedtime.    Historical Provider, MD  diphenhydrAMINE (SOMINEX) 25 MG tablet Take 25 mg by mouth at bedtime as needed for sleep.    Historical Provider, MD  HYDROcodone-acetaminophen (NORCO/VICODIN) 5-325 MG per tablet Take 1 tablet by mouth every 6 (six) hours as needed. 12/10/14   Merryl Hacker, MD  ibuprofen (ADVIL,MOTRIN) 600 MG tablet Take 1 tablet (600 mg total) by mouth every 6 (six) hours as needed. 01/31/16   Julianne Rice, MD   meclizine (ANTIVERT) 25 MG tablet Take 1 tablet (25 mg total) by mouth 3 (three) times daily as needed for dizziness. 05/18/13   Radene Gunning, NP  methocarbamol (ROBAXIN) 500 MG tablet Take 2 tablets (1,000 mg total) by mouth every 8 (eight) hours as needed for muscle spasms. 01/31/16   Julianne Rice, MD  metoCLOPramide (REGLAN) 10 MG tablet Take 1 tablet (10 mg total) by mouth every 6 (six) hours as needed for nausea (nausea/headache). 01/31/16   Julianne Rice, MD  Multiple Vitamins-Minerals (ALIVE MENS ENERGY PO) Take 1 tablet by mouth daily.    Historical Provider, MD  omeprazole (PRILOSEC) 20 MG capsule Take 20 mg by mouth daily.    Historical Provider, MD  ondansetron (ZOFRAN ODT) 4 MG disintegrating tablet Take 1 tablet (4 mg total) by mouth every 8 (eight) hours as needed for nausea or vomiting. 12/10/14   Merryl Hacker, MD    Family History Family History  Problem Relation Age of Onset  . Heart failure Father     Social History Social History  Substance Use Topics  . Smoking status: Never Smoker  . Smokeless tobacco: Never Used  . Alcohol use No     Allergies   Penicillins   Review of Systems Review of Systems  Constitutional: Negative for chills and fever.  HENT: Negative for facial swelling, sinus pressure and sore throat.   Eyes: Positive for photophobia and visual disturbance.  Respiratory: Negative for cough and shortness of breath.   Cardiovascular: Negative for chest pain and palpitations.  Gastrointestinal: Positive for vomiting. Negative for abdominal pain, diarrhea and nausea.  Musculoskeletal: Positive for myalgias and neck pain. Negative for neck stiffness.  Skin: Negative for rash and wound.  Neurological: Positive for dizziness, light-headedness and headaches. Negative for weakness and numbness.  Psychiatric/Behavioral: Negative for suicidal ideas. The patient is nervous/anxious.   All other systems reviewed and are negative.    Physical  Exam Updated Vital Signs BP 124/90 (BP Location: Left Arm)   Pulse 72   Temp 97.5 F (36.4 C) (Oral)   Resp 18   Ht 5' 11"  (1.803 m)   Wt 195 lb (88.5 kg)   SpO2 100%   BMI 27.20 kg/m   Physical Exam  Constitutional: He is oriented to person, place, and time. He appears well-developed and well-nourished.  Anxious appearing and tearful  HENT:  Head: Normocephalic and atraumatic.  Mouth/Throat: Oropharynx is clear and moist.  No obvious scalp injury. No hematoma.  Eyes: EOM are normal. Pupils are equal, round, and reactive to light.  Neck: Normal range of motion. Neck supple.  Patient with diffuse paracervical muscular tenderness and bilateral trapezius tenderness with spasm. No definite midline focal cervical tenderness  Cardiovascular: Normal rate and regular rhythm.  Exam reveals no friction rub.   No murmur heard. Pulmonary/Chest: Effort normal and breath sounds normal. No respiratory distress. He has no wheezes. He has no rales. He exhibits no tenderness.  Abdominal: Soft. Bowel sounds are normal. There is no tenderness. There is no rebound and no guarding.  Musculoskeletal: Normal range of motion. He exhibits no edema or tenderness.  Neurological: He is alert and oriented to person, place, and time.  Patient is alert and oriented x3 with clear, goal oriented speech. Patient has 5/5 motor in all extremities. Sensation is intact to light touch. Patient has a normal gait and walks without assistance.  Skin: Skin is warm and dry. Capillary refill takes less than 2 seconds. No rash noted. No erythema.  Nursing note and vitals reviewed.    ED Treatments / Results  Labs (all labs ordered are listed, but only abnormal results are displayed) Labs Reviewed - No data to display  EKG  EKG Interpretation None       Radiology Ct Head Wo Contrast  Result Date: 01/29/2016 CLINICAL DATA:  Ground level fall, striking head on rocks 4 days ago. Confusion tonight. EXAM: CT HEAD  WITHOUT CONTRAST CT CERVICAL SPINE WITHOUT CONTRAST TECHNIQUE: Multidetector CT imaging of the head and cervical spine was performed following the standard protocol without intravenous contrast. Multiplanar CT image reconstructions of the cervical spine were also generated. COMPARISON:  05/16/2013 FINDINGS: CT HEAD FINDINGS Brain: No evidence of acute infarction, hemorrhage, hydrocephalus, extra-axial collection or mass lesion/mass effect. Gray matter and white matter are unremarkable, with normal differentiation. Brain volume is normal for age. Vascular: No hyperdense vessel or unexpected calcification. Skull: Normal. Negative for fracture or focal lesion. Sinuses/Orbits: No acute finding. CT CERVICAL SPINE FINDINGS Alignment: Normal. Skull base and vertebrae: No acute fracture. No primary bone lesion or focal pathologic process. Soft tissues and spinal canal: No prevertebral fluid or swelling. No visible canal hematoma. Disc levels: Mild degenerative cervical disc disease, greatest from C4 through C6. Mild facet arthritis, greatest on the right at C7-T1. Upper chest: No significant abnormality. IMPRESSION: 1. Normal brain 2. Negative for cervical spine fracture. Mild degenerative disc and facet changes, typical for age.  Electronically Signed   By: Andreas Newport M.D.   On: 01/29/2016 23:31   Ct Cervical Spine Wo Contrast  Result Date: 01/29/2016 CLINICAL DATA:  Ground level fall, striking head on rocks 4 days ago. Confusion tonight. EXAM: CT HEAD WITHOUT CONTRAST CT CERVICAL SPINE WITHOUT CONTRAST TECHNIQUE: Multidetector CT imaging of the head and cervical spine was performed following the standard protocol without intravenous contrast. Multiplanar CT image reconstructions of the cervical spine were also generated. COMPARISON:  05/16/2013 FINDINGS: CT HEAD FINDINGS Brain: No evidence of acute infarction, hemorrhage, hydrocephalus, extra-axial collection or mass lesion/mass effect. Gray matter and white  matter are unremarkable, with normal differentiation. Brain volume is normal for age. Vascular: No hyperdense vessel or unexpected calcification. Skull: Normal. Negative for fracture or focal lesion. Sinuses/Orbits: No acute finding. CT CERVICAL SPINE FINDINGS Alignment: Normal. Skull base and vertebrae: No acute fracture. No primary bone lesion or focal pathologic process. Soft tissues and spinal canal: No prevertebral fluid or swelling. No visible canal hematoma. Disc levels: Mild degenerative cervical disc disease, greatest from C4 through C6. Mild facet arthritis, greatest on the right at C7-T1. Upper chest: No significant abnormality. IMPRESSION: 1. Normal brain 2. Negative for cervical spine fracture. Mild degenerative disc and facet changes, typical for age. Electronically Signed   By: Andreas Newport M.D.   On: 01/29/2016 23:31    Procedures Procedures (including critical care time)  Medications Ordered in ED Medications  ketorolac (TORADOL) injection 60 mg (60 mg Intramuscular Given 01/31/16 1425)  methocarbamol (ROBAXIN) tablet 1,000 mg (1,000 mg Oral Given 01/31/16 1424)  metoCLOPramide (REGLAN) tablet 10 mg (10 mg Oral Given 01/31/16 1424)     Initial Impression / Assessment and Plan / ED Course  I have reviewed the triage vital signs and the nursing notes.  Pertinent labs & imaging results that were available during my care of the patient were reviewed by me and considered in my medical decision making (see chart for details).  Clinical Course   We'll treat symptomatically. Stressed importance of abstinence from of physical exertion or mental exertion. Patient has a normal neurologic exam. Do not believe that repeat imaging is needed at this point. Will give neurology follow-up.  Final Clinical Impressions(s) / ED Diagnoses   Final diagnoses:  Post concussion syndrome  Anxiety    New Prescriptions New Prescriptions   IBUPROFEN (ADVIL,MOTRIN) 600 MG TABLET    Take 1 tablet  (600 mg total) by mouth every 6 (six) hours as needed.   METHOCARBAMOL (ROBAXIN) 500 MG TABLET    Take 2 tablets (1,000 mg total) by mouth every 8 (eight) hours as needed for muscle spasms.   METOCLOPRAMIDE (REGLAN) 10 MG TABLET    Take 1 tablet (10 mg total) by mouth every 6 (six) hours as needed for nausea (nausea/headache).     Julianne Rice, MD 01/31/16 1436

## 2016-01-31 NOTE — Discharge Instructions (Signed)
Call and make an appointment to follow-up with his neurologist. Avoid any physical or mental stimulation. Take medication as prescribed.

## 2016-05-07 ENCOUNTER — Other Ambulatory Visit (HOSPITAL_COMMUNITY): Payer: Self-pay | Admitting: Internal Medicine

## 2016-05-07 ENCOUNTER — Ambulatory Visit (HOSPITAL_COMMUNITY)
Admission: RE | Admit: 2016-05-07 | Discharge: 2016-05-07 | Disposition: A | Discharge status: Home / Self Care | Payer: No Typology Code available for payment source | Source: Ambulatory Visit | Attending: Internal Medicine | Admitting: Internal Medicine

## 2016-05-07 DIAGNOSIS — R1909 Other intra-abdominal and pelvic swelling, mass and lump: Secondary | ICD-10-CM

## 2016-05-07 DIAGNOSIS — K4020 Bilateral inguinal hernia, without obstruction or gangrene, not specified as recurrent: Secondary | ICD-10-CM | POA: Insufficient documentation

## 2016-05-07 DIAGNOSIS — R229 Localized swelling, mass and lump, unspecified: Principal | ICD-10-CM

## 2016-05-07 DIAGNOSIS — IMO0002 Reserved for concepts with insufficient information to code with codable children: Secondary | ICD-10-CM

## 2016-05-27 ENCOUNTER — Other Ambulatory Visit: Payer: Self-pay | Admitting: General Surgery

## 2016-05-28 ENCOUNTER — Encounter (HOSPITAL_COMMUNITY): Payer: Self-pay | Admitting: *Deleted

## 2016-05-28 NOTE — H&P (Signed)
Zachary Nash Location: [facility] Patient #: [id number] DOB: 02-Feb-1971 Married / Language: English / Race: White Male        History of Present Illness  .The patient is a 46 year old male who presents with an inguinal hernia. 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 He is referred by Dr. Wende Neighbors in [place] for painful bilateral inguinal hernias.  He has no prior history of hernia. On January 18 he was doing a lot of heavy lifting and straining with the tailgate of his truck and developed bilateral groin pain. He says his testicles have been painful since that time and he has felt some small bulges. Having daily pain. Unable to work. Tolerating diet. When he has pain he feels a little nauseated.  Ultrasound of the groins was negative, but on May 07, 2016 he had CT scan of the abdomen and pelvis which was normal except for small fat containing inguinal hernias, right larger than left. He has been out of work for 3 weeks due to pain. He says he has good insurance and has a 10 week window to get these repaired and get back to work.  Comorbidities include laparoscopic cholecystectomy in past. Intermittent asthma. Does not use inhalers daily Family history mother deceased of ovarian cancer. 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 Lives in [place]. Married with 2 children. Denies alcohol or tobacco.  We had a long conversation about his hernias. I offered to do this laparoscopically or open. I told him that recurrence rates after laparoscopic repairs are little bit higher and pain is greater temporarily after open repairs. He decided to have open repairs since he has plenty of time off from work with good benefits. He will also [facility] either [facility] we will schedule this as an urgent procedure due to daily  pain. He will be scheduled for open repair of bilateral inguinal hernias with mesh as an outpatient. I discussed the indications, details, techniques, and numerous risk of the surgery with him. He is aware of the risk of bleeding, infection, recurrence, nerve damage with chronic pain or numbness, injury to the testicle bladder or intestine, and other unforeseen problems. He understands these issues well. All of his questions were answered. He agrees with this plan.     Past Surgical History  Gallbladder Surgery - Laparoscopic  Oral Surgery   Diagnostic Studies History  Colonoscopy  never  Allergies  Penicillin G Pot in Dextrose *PENICILLINS*   Medication History  Albuterol Sulfate HFA (108 (90 Base)MCG/ACT Aerosol Soln, Inhalation) Active. Advil (200MG Capsule, Oral) Active. Omeprazole (20MG Capsule DR, Oral) Active. Multi-Day (Oral) Active. Medications Reconciled  Social History  Caffeine use  Carbonated beverages. No alcohol use  No drug use  Tobacco use  Never smoker.  Family History  Heart disease in male family member before age 21  Ovarian Cancer  Mother.  Other Problems  Asthma  Gastroesophageal Reflux Disease  Kidney Stone  Umbilical Hernia Repair     Review of Systems  General Not Present- Appetite Loss, Chills, Fatigue, Fever, Night Sweats, Weight Gain and Weight Loss. Skin Not Present- Change in Wart/Mole, Dryness, Hives, Jaundice, New Lesions, Non-Healing Wounds, Rash and Ulcer. HEENT Not Present- Earache, Hearing Loss, Hoarseness, Nose Bleed, Oral Ulcers, Ringing in the Ears, Seasonal Allergies, Sinus Pain, Sore Throat, Visual Disturbances, Wears glasses/contact lenses and Yellow Eyes.  Respiratory Not Present- Bloody sputum, Chronic Cough, Difficulty Breathing, Snoring and Wheezing. Breast Not Present- Breast Mass, Breast Pain, Nipple Discharge and Skin Changes. Cardiovascular Not Present- Chest Pain, Difficulty Breathing Lying Down,  Leg Cramps, Palpitations, Rapid Heart Rate, Shortness of Breath and Swelling of Extremities. Gastrointestinal Present- Nausea. Not Present- Abdominal Pain, Bloating, Bloody Stool, Change in Bowel Habits, Chronic diarrhea, Constipation, Difficulty Swallowing, Excessive gas, Gets full quickly at meals, Hemorrhoids, Indigestion, Rectal Pain and Vomiting. Male Genitourinary Not Present- Blood in Urine, Change in Urinary Stream, Frequency, Impotence, Nocturia, Painful Urination, Urgency and Urine Leakage. Musculoskeletal Not Present- Back Pain, Joint Pain, Joint Stiffness, Muscle Pain, Muscle Weakness and Swelling of Extremities. Neurological Present- Tingling. Not Present- Decreased Memory, Fainting, Headaches, Numbness, Seizures, Tremor, Trouble walking and Weakness. Psychiatric Not Present- Anxiety, Bipolar, Change in Sleep Pattern, Depression, Fearful and Frequent crying. Endocrine Not Present- Cold Intolerance, Excessive Hunger, Hair Changes, Heat Intolerance, Hot flashes and New Diabetes. Hematology Not Present- Blood Thinners, Easy Bruising, Excessive bleeding, Gland problems, HIV and Persistent Infections.  Vitals Weight: 195 lb Height: 68in Body Surface Area: 2.02 m Body Mass Index: 29.65 kg/m  Pulse: 94 (Regular)  BP: 120/80 (Sitting, Left Arm, Standard)    Physical Exam General Mental Status-Alert. General Appearance-Consistent with stated age. Hydration-Well hydrated. Voice-Normal.  Head and Neck Head-normocephalic, atraumatic with no lesions or palpable masses. Trachea-midline. Thyroid Gland Characteristics - normal size and consistency.  Eye Eyeball - Bilateral-Extraocular movements intact. Sclera/Conjunctiva - Bilateral-No scleral icterus.  Chest and Lung Exam Chest and lung exam reveals -quiet, even and easy respiratory effort with no use of accessory muscles and on auscultation, normal breath sounds, no adventitious sounds and normal vocal  resonance. Inspection Chest Wall - Normal. Back - normal.  Breast Breast - Left-Symmetric, Non Tender, No Biopsy scars, no Dimpling, No Inflammation, No Lumpectomy scars, No Mastectomy scars, No Peau d' Orange. Breast - Right-Symmetric, Non Tender, No Biopsy scars, no Dimpling, No Inflammation, No Lumpectomy scars, No Mastectomy scars, No Peau d' Orange. Breast Lump-No Palpable Breast Mass.  Cardiovascular Cardiovascular examination reveals -normal heart sounds, regular rate and rhythm with no murmurs and normal pedal pulses bilaterally.  Abdomen Inspection Inspection of the abdomen reveals - No Hernias. Skin - Scar - Note: Transverse scar below umbilicus and other trochars scars from cholecystectomy no umbilical hernia. Palpation/Percussion Palpation and Percussion of the abdomen reveal - Soft, Non Tender, No Rebound tenderness, No Rigidity (guarding) and No hepatosplenomegaly. Auscultation Auscultation of the abdomen reveals - Bowel sounds normal.  Male Genitourinary Note: Penis scrotum and testes are normal. He has bilateral inguinal hernias that are reducible. The right side is greater than left. They did not extend into the scrotum. Penis scrotum and testes are normal.   Neurologic Neurologic evaluation reveals -alert and oriented x 3 with no impairment of recent or remote memory. Mental Status-Normal.  Musculoskeletal Normal Exam - Left-Upper Extremity Strength Normal and Lower Extremity Strength Normal. Normal Exam - Right-Upper Extremity Strength Normal and Lower Extremity Strength Normal.  Lymphatic Head & Neck  General Head & Neck Lymphatics: Bilateral - Description - Normal. Axillary  General Axillary Region: Bilateral - Description - Normal. Tenderness - Non Tender. Femoral & Inguinal  Generalized Femoral & Inguinal Lymphatics: Bilateral - Description - Normal. Tenderness - Non Tender.    Assessment & Plan  NON-RECURRENT BILATERAL INGUINAL  HERNIA WITHOUT OBSTRUCTION OR GANGRENE (K40.20)   You have bilateral inguinal hernias, right side is a little bit larger than the left There is no immediate  danger, but the amount of pain that you are having has taken you out of work We have discussed both the open technique and the laparoscopic technique You have chosen the open technique You will not be able to do any sports or heavy lifting more than 20 pounds for 5 weeks  you will be scheduled for open repair of bilateral inguinal hernias with mesh in the very near future We have discussed the indications, techniques, and risk of the surgery in detail  Please read the present information that we gave you  MILD INTERMITTENT CHRONIC ASTHMA WITHOUT COMPLICATION (Y05.11) HISTORY OF LAPAROSCOPIC CHOLECYSTECTOMY (Z98.890)

## 2016-05-28 NOTE — Progress Notes (Signed)
Spoke with pt for pre-op call. Pt denies cardiac history, chest pain or sob. Pt states he is not diabetic. Pt has had recent labs done and will bring a copy of the results with him in the AM.

## 2016-05-29 ENCOUNTER — Ambulatory Visit (HOSPITAL_COMMUNITY): Payer: Worker's Compensation | Admitting: Certified Registered Nurse Anesthetist

## 2016-05-29 ENCOUNTER — Ambulatory Visit (HOSPITAL_COMMUNITY)
Admission: RE | Admit: 2016-05-29 | Discharge: 2016-05-29 | Disposition: A | Discharge status: Home / Self Care | Payer: Worker's Compensation | Source: Ambulatory Visit | Attending: General Surgery | Admitting: General Surgery

## 2016-05-29 ENCOUNTER — Encounter (HOSPITAL_COMMUNITY)
Admission: RE | Disposition: A | Discharge status: Home / Self Care | Payer: Self-pay | Source: Ambulatory Visit | Attending: General Surgery

## 2016-05-29 ENCOUNTER — Encounter (HOSPITAL_COMMUNITY): Payer: Self-pay | Admitting: General Surgery

## 2016-05-29 DIAGNOSIS — Z880 Allergy status to penicillin: Secondary | ICD-10-CM | POA: Diagnosis not present

## 2016-05-29 DIAGNOSIS — J4520 Mild intermittent asthma, uncomplicated: Secondary | ICD-10-CM | POA: Diagnosis not present

## 2016-05-29 DIAGNOSIS — Z79899 Other long term (current) drug therapy: Secondary | ICD-10-CM | POA: Diagnosis not present

## 2016-05-29 DIAGNOSIS — K4020 Bilateral inguinal hernia, without obstruction or gangrene, not specified as recurrent: Secondary | ICD-10-CM | POA: Insufficient documentation

## 2016-05-29 HISTORY — DX: Bilateral inguinal hernia, without obstruction or gangrene, not specified as recurrent: K40.20

## 2016-05-29 HISTORY — DX: Personal history of urinary calculi: Z87.442

## 2016-05-29 HISTORY — DX: Gastro-esophageal reflux disease without esophagitis: K21.9

## 2016-05-29 HISTORY — DX: Anxiety disorder, unspecified: F41.9

## 2016-05-29 HISTORY — PX: INGUINAL HERNIA REPAIR: SHX194

## 2016-05-29 HISTORY — DX: Fatty (change of) liver, not elsewhere classified: K76.0

## 2016-05-29 HISTORY — DX: Pneumonia, unspecified organism: J18.9

## 2016-05-29 HISTORY — DX: Constipation, unspecified: K59.00

## 2016-05-29 SURGERY — REPAIR, HERNIA, INGUINAL, BILATERAL, ADULT
Anesthesia: Regional | Site: Groin | Laterality: Bilateral

## 2016-05-29 MED ORDER — FENTANYL CITRATE (PF) 100 MCG/2ML IJ SOLN
INTRAMUSCULAR | Status: AC
  Filled 2016-05-29: qty 2

## 2016-05-29 MED ORDER — VANCOMYCIN HCL 10 G IV SOLR
1500.0000 mg | INTRAVENOUS | Status: AC
  Administered 2016-05-29: 1500 mg via INTRAVENOUS
  Filled 2016-05-29 (×2): qty 1500

## 2016-05-29 MED ORDER — FENTANYL CITRATE (PF) 100 MCG/2ML IJ SOLN
INTRAMUSCULAR | Status: DC | PRN
  Administered 2016-05-29 (×3): 50 ug via INTRAVENOUS

## 2016-05-29 MED ORDER — LIDOCAINE 2% (20 MG/ML) 5 ML SYRINGE
INTRAMUSCULAR | Status: DC | PRN
  Administered 2016-05-29: 40 mg via INTRAVENOUS

## 2016-05-29 MED ORDER — FENTANYL CITRATE (PF) 100 MCG/2ML IJ SOLN
INTRAMUSCULAR | Status: AC
  Filled 2016-05-29: qty 4

## 2016-05-29 MED ORDER — FENTANYL CITRATE (PF) 100 MCG/2ML IJ SOLN
INTRAMUSCULAR | Status: AC
  Administered 2016-05-29: 100 ug
  Filled 2016-05-29: qty 2

## 2016-05-29 MED ORDER — BUPIVACAINE-EPINEPHRINE 0.5% -1:200000 IJ SOLN
INTRAMUSCULAR | Status: DC | PRN
  Administered 2016-05-29: 17 mL

## 2016-05-29 MED ORDER — ONDANSETRON HCL 4 MG/2ML IJ SOLN
4.0000 mg | Freq: Once | INTRAMUSCULAR | Status: AC | PRN
  Administered 2016-05-29: 4 mg via INTRAVENOUS

## 2016-05-29 MED ORDER — PHENYLEPHRINE HCL 10 MG/ML IJ SOLN
INTRAVENOUS | Status: DC | PRN
  Administered 2016-05-29: 25 ug/min via INTRAVENOUS

## 2016-05-29 MED ORDER — ONDANSETRON HCL 4 MG/2ML IJ SOLN
INTRAMUSCULAR | Status: AC
  Filled 2016-05-29: qty 2

## 2016-05-29 MED ORDER — OXYCODONE HCL 5 MG/5ML PO SOLN: 5.0000 mg | Freq: Once | ORAL | Status: DC | PRN

## 2016-05-29 MED ORDER — LACTATED RINGERS IV SOLN
INTRAVENOUS | Status: DC
  Administered 2016-05-29: 12:00:00 via INTRAVENOUS
  Administered 2016-05-29: 50 mL/h via INTRAVENOUS

## 2016-05-29 MED ORDER — MIDAZOLAM HCL 2 MG/2ML IJ SOLN
INTRAMUSCULAR | Status: DC | PRN
  Administered 2016-05-29: 2 mg via INTRAVENOUS

## 2016-05-29 MED ORDER — ONDANSETRON HCL 4 MG/2ML IJ SOLN
INTRAMUSCULAR | Status: DC | PRN
  Administered 2016-05-29: 4 mg via INTRAVENOUS

## 2016-05-29 MED ORDER — PHENYLEPHRINE 40 MCG/ML (10ML) SYRINGE FOR IV PUSH (FOR BLOOD PRESSURE SUPPORT)
PREFILLED_SYRINGE | INTRAVENOUS | Status: DC | PRN
  Administered 2016-05-29 (×2): 40 ug via INTRAVENOUS
  Administered 2016-05-29: 120 ug via INTRAVENOUS
  Administered 2016-05-29: 40 ug via INTRAVENOUS
  Administered 2016-05-29: 120 ug via INTRAVENOUS
  Administered 2016-05-29: 80 ug via INTRAVENOUS
  Administered 2016-05-29 (×2): 40 ug via INTRAVENOUS

## 2016-05-29 MED ORDER — EPHEDRINE SULFATE 50 MG/ML IJ SOLN
INTRAMUSCULAR | Status: AC
  Administered 2016-05-29: 15 mg
  Filled 2016-05-29: qty 1

## 2016-05-29 MED ORDER — ROCURONIUM BROMIDE 10 MG/ML (PF) SYRINGE
PREFILLED_SYRINGE | INTRAVENOUS | Status: DC | PRN
  Administered 2016-05-29: 50 mg via INTRAVENOUS

## 2016-05-29 MED ORDER — MIDAZOLAM HCL 2 MG/2ML IJ SOLN
INTRAMUSCULAR | Status: AC
  Administered 2016-05-29: 2 mg
  Filled 2016-05-29: qty 2

## 2016-05-29 MED ORDER — FENTANYL CITRATE (PF) 100 MCG/2ML IJ SOLN
25.0000 ug | INTRAMUSCULAR | Status: DC | PRN
  Administered 2016-05-29 (×3): 50 ug via INTRAVENOUS

## 2016-05-29 MED ORDER — ATROPINE SULFATE 1 MG/10ML IJ SOSY
PREFILLED_SYRINGE | INTRAMUSCULAR | Status: AC
  Administered 2016-05-29: 0.8 mg
  Filled 2016-05-29: qty 10

## 2016-05-29 MED ORDER — CHLORHEXIDINE GLUCONATE CLOTH 2 % EX PADS: 6.0000 | MEDICATED_PAD | Freq: Once | CUTANEOUS | Status: DC

## 2016-05-29 MED ORDER — 0.9 % SODIUM CHLORIDE (POUR BTL) OPTIME
TOPICAL | Status: DC | PRN
  Administered 2016-05-29: 1000 mL

## 2016-05-29 MED ORDER — OXYCODONE HCL 5 MG PO TABS
ORAL_TABLET | ORAL | Status: AC
  Filled 2016-05-29: qty 2

## 2016-05-29 MED ORDER — SUGAMMADEX SODIUM 200 MG/2ML IV SOLN
INTRAVENOUS | Status: AC
  Filled 2016-05-29: qty 10

## 2016-05-29 MED ORDER — SUGAMMADEX SODIUM 200 MG/2ML IV SOLN
INTRAVENOUS | Status: DC | PRN
  Administered 2016-05-29: 180 mg via INTRAVENOUS

## 2016-05-29 MED ORDER — BUPIVACAINE-EPINEPHRINE (PF) 0.5% -1:200000 IJ SOLN
INTRAMUSCULAR | Status: AC
  Filled 2016-05-29: qty 30

## 2016-05-29 MED ORDER — MIDAZOLAM HCL 2 MG/2ML IJ SOLN
INTRAMUSCULAR | Status: AC
  Filled 2016-05-29: qty 2

## 2016-05-29 MED ORDER — PROPOFOL 10 MG/ML IV BOLUS
INTRAVENOUS | Status: DC | PRN
  Administered 2016-05-29: 260 mg via INTRAVENOUS

## 2016-05-29 MED ORDER — OXYCODONE HCL 5 MG PO TABS: 5.0000 mg | ORAL_TABLET | Freq: Once | ORAL | Status: DC | PRN

## 2016-05-29 MED ORDER — OXYCODONE-ACETAMINOPHEN 7.5-325 MG PO TABS: 1.0000 | ORAL_TABLET | ORAL | 0 refills | Status: DC | PRN

## 2016-05-29 MED ORDER — OXYCODONE HCL 5 MG PO TABS
5.0000 mg | ORAL_TABLET | ORAL | Status: DC | PRN
  Administered 2016-05-29: 10 mg via ORAL

## 2016-05-29 MED ORDER — DEXAMETHASONE SODIUM PHOSPHATE 10 MG/ML IJ SOLN
INTRAMUSCULAR | Status: DC | PRN
  Administered 2016-05-29: 10 mg via INTRAVENOUS

## 2016-05-29 SURGICAL SUPPLY — 60 items
ADH SKN CLS APL DERMABOND .7 (GAUZE/BANDAGES/DRESSINGS) ×2
ADH SKN CLS LQ APL DERMABOND (GAUZE/BANDAGES/DRESSINGS) ×2
BLADE CLIPPER SURG (BLADE) IMPLANT
BLADE SURG 10 STRL SS (BLADE) ×2 IMPLANT
BLADE SURG 15 STRL LF DISP TIS (BLADE) IMPLANT
BLADE SURG 15 STRL SS (BLADE) ×3
CANISTER SUCT 3000ML PPV (MISCELLANEOUS) IMPLANT
CHLORAPREP W/TINT 26ML (MISCELLANEOUS) ×3 IMPLANT
COVER SURGICAL LIGHT HANDLE (MISCELLANEOUS) ×3 IMPLANT
DERMABOND ADHESIVE PROPEN (GAUZE/BANDAGES/DRESSINGS) ×4
DERMABOND ADVANCED (GAUZE/BANDAGES/DRESSINGS) ×4
DERMABOND ADVANCED .7 DNX12 (GAUZE/BANDAGES/DRESSINGS) ×2 IMPLANT
DERMABOND ADVANCED .7 DNX6 (GAUZE/BANDAGES/DRESSINGS) IMPLANT
DRAIN PENROSE 1/2X12 LTX STRL (WOUND CARE) ×2 IMPLANT
DRAPE LAPAROTOMY TRNSV 102X78 (DRAPE) ×3 IMPLANT
DRAPE UTILITY XL STRL (DRAPES) ×6 IMPLANT
ELECT CAUTERY BLADE 6.4 (BLADE) ×3 IMPLANT
ELECT REM PT RETURN 9FT ADLT (ELECTROSURGICAL) ×3
ELECTRODE REM PT RTRN 9FT ADLT (ELECTROSURGICAL) ×1 IMPLANT
GLOVE BIO SURGEON STRL SZ 6.5 (GLOVE) ×1 IMPLANT
GLOVE BIO SURGEON STRL SZ7 (GLOVE) ×2 IMPLANT
GLOVE BIO SURGEONS STRL SZ 6.5 (GLOVE) ×1
GLOVE BIOGEL PI IND STRL 6.5 (GLOVE) IMPLANT
GLOVE BIOGEL PI INDICATOR 6.5 (GLOVE) ×6
GLOVE EUDERMIC 7 POWDERFREE (GLOVE) ×3 IMPLANT
GLOVE SURG SS PI 6.5 STRL IVOR (GLOVE) ×2 IMPLANT
GOWN STRL REUS W/ TWL LRG LVL3 (GOWN DISPOSABLE) ×1 IMPLANT
GOWN STRL REUS W/ TWL XL LVL3 (GOWN DISPOSABLE) ×1 IMPLANT
GOWN STRL REUS W/TWL LRG LVL3 (GOWN DISPOSABLE) ×6
GOWN STRL REUS W/TWL XL LVL3 (GOWN DISPOSABLE) ×3
KIT BASIN OR (CUSTOM PROCEDURE TRAY) ×3 IMPLANT
KIT ROOM TURNOVER OR (KITS) ×3 IMPLANT
MESH ULTRAPRO 3X6 7.6X15CM (Mesh General) ×4 IMPLANT
NDL HYPO 25GX1X1/2 BEV (NEEDLE) ×1 IMPLANT
NEEDLE HYPO 25GX1X1/2 BEV (NEEDLE) ×3 IMPLANT
NS IRRIG 1000ML POUR BTL (IV SOLUTION) ×3 IMPLANT
PACK SURGICAL SETUP 50X90 (CUSTOM PROCEDURE TRAY) ×3 IMPLANT
PAD ARMBOARD 7.5X6 YLW CONV (MISCELLANEOUS) ×3 IMPLANT
PENCIL BUTTON HOLSTER BLD 10FT (ELECTRODE) ×3 IMPLANT
SOLUTION BETADINE 4OZ (MISCELLANEOUS) ×2 IMPLANT
SPONGE INTESTINAL PEANUT (DISPOSABLE) IMPLANT
SPONGE LAP 18X18 X RAY DECT (DISPOSABLE) ×3 IMPLANT
SUT MNCRL AB 4-0 PS2 18 (SUTURE) ×6 IMPLANT
SUT PROLENE 2 0 CT2 30 (SUTURE) ×18 IMPLANT
SUT SILK 2 0 (SUTURE) ×3
SUT SILK 2 0 SH (SUTURE) ×2 IMPLANT
SUT SILK 2-0 18XBRD TIE 12 (SUTURE) ×2 IMPLANT
SUT VIC AB 2-0 CT1 27 (SUTURE) ×9
SUT VIC AB 2-0 CT1 TAPERPNT 27 (SUTURE) ×2 IMPLANT
SUT VIC AB 3-0 SH 27 (SUTURE) ×9
SUT VIC AB 3-0 SH 27XBRD (SUTURE) ×2 IMPLANT
SUT VICRYL AB 2 0 TIES (SUTURE) ×3 IMPLANT
SYR BULB 3OZ (MISCELLANEOUS) ×3 IMPLANT
SYR CONTROL 10ML LL (SYRINGE) ×3 IMPLANT
TOWEL OR 17X24 6PK STRL BLUE (TOWEL DISPOSABLE) ×3 IMPLANT
TOWEL OR 17X26 10 PK STRL BLUE (TOWEL DISPOSABLE) ×3 IMPLANT
TRAY CATH 16FR W/PLASTIC CATH (SET/KITS/TRAYS/PACK) ×2 IMPLANT
TUBE CONNECTING 12'X1/4 (SUCTIONS)
TUBE CONNECTING 12X1/4 (SUCTIONS) IMPLANT
YANKAUER SUCT BULB TIP NO VENT (SUCTIONS) ×2 IMPLANT

## 2016-05-29 NOTE — Interval H&P Note (Signed)
History and Physical Interval Note:  05/29/2016 10:30 AM  Zachary Nash  has presented today for surgery, with the diagnosis of Bilateral Inguinal Hernia  The various methods of treatment have been discussed with the patient and family. After consideration of risks, benefits and other options for treatment, the patient has consented to  Procedure(s): OPEN HERNIA REPAIR INGUINAL ADULT BILATERAL WITH MESH (Bilateral) as a surgical intervention .  The patient's history has been reviewed, patient examined, no change in status, stable for surgery.  I have reviewed the patient's chart and labs.  Questions were answered to the patient's satisfaction.     Adin Hector

## 2016-05-29 NOTE — Discharge Instructions (Signed)
[facility] _______[facility]  UMBILICAL OR INGUINAL HERNIA REPAIR: POST OP INSTRUCTIONS  Always review your discharge instruction sheet given to you by the facility where your surgery was performed. IF YOU HAVE DISABILITY OR FAMILY LEAVE FORMS, YOU MUST BRING THEM TO THE OFFICE FOR PROCESSING.   DO NOT GIVE THEM TO YOUR DOCTOR.  1. A  prescription for pain medication may be given to you upon discharge.  Take your pain medication as prescribed, if needed.  If narcotic pain medicine is not needed, then you may take acetaminophen (Tylenol) or ibuprofen (Advil) as needed. 2. Take your usually prescribed medications unless otherwise directed. If you need a refill on your pain medication, please contact your pharmacy.  They will contact our office to request authorization. Prescriptions will not be filled after 5 pm or on week-ends. 3. You should follow a light diet the first 24 hours after arrival home, such as soup and crackers, etc.  Be sure to include lots of fluids daily.  Resume your normal diet the day after surgery. 4.Most patients will experience some swelling and bruising around the umbilicus or in the groin and scrotum.  Ice packs and reclining will help.  Swelling and bruising can take several days to resolve.  6. It is common to experience some constipation if taking pain medication after surgery.  Increasing fluid intake and taking a stool softener (such as Colace) will usually help or prevent this problem from occurring.  A mild laxative (Milk of Magnesia or Miralax) should be taken according to package directions if there are no bowel movements after 48 hours. 7. Unless discharge instructions indicate otherwise, you may remove your bandages 24-48 hours after surgery, and you may shower at that time.  You may have steri-strips (small skin tapes) in place directly over the incision.  These strips should be left on the skin for 7-10 days.  If your surgeon used skin glue on the  incision, you may shower in 24 hours.  The glue will flake off over the next 2-3 weeks.  Any sutures or staples will be removed at the office during your follow-up visit. 8. ACTIVITIES:  You may resume regular (light) daily activities beginning the next day--such as daily self-care, walking, climbing stairs--gradually increasing activities as tolerated.  You may have sexual intercourse when it is comfortable.  Refrain from any heavy lifting or straining until approved by your doctor.  a.You may drive when you are no longer taking prescription pain medication, you can comfortably wear a seatbelt, and you can safely maneuver your car and apply brakes. b.RETURN TO WORK:   ____________6 weeks.    No sports or lifting for 6 weeeks_________________________________  9.You should see your doctor in the office for a follow-up appointment approximately 2-3 weeks after your surgery.  Make sure that you call for this appointment within a day or two after you arrive home to insure a convenient appointment time. 10.OTHER INSTRUCTIONS: _________________________    _____________________________________  WHEN TO CALL YOUR DOCTOR: 1. Fever over 101.0 2. Inability to urinate 3. Nausea and/or vomiting 4. Extreme swelling or bruising 5. Continued bleeding from incision. 6. Increased pain, redness, or drainage from the incision  The clinic staff is available to answer your questions during regular business hours.  Please dont hesitate to call and ask to speak to one of the nurses for clinical concerns.  If you have a medical emergency, go to the nearest emergency room or call 911.  A surgeon from [facility] is always on call at the hospital   [address], [place], [place], [place]  [address] ?  [address], [place], [place]   [address] [phone] ? [phone] ? FAX [phone] Web site: [email or web address]

## 2016-05-29 NOTE — Anesthesia Procedure Notes (Signed)
Anesthesia Regional Block: TAP block   Pre-Anesthetic Checklist: ,, timeout performed, Correct Patient, Correct Site, Correct Laterality, Correct Procedure, Correct Position, site marked, Risks and benefits discussed,  Surgical consent,  Pre-op evaluation,  At surgeon's request and post-op pain management  Laterality: Left  Prep: chloraprep       Needles:  Injection technique: Single-shot  Needle Type: Echogenic Stimulator Needle          Additional Needles:   Procedures: ultrasound guided,,,,,,,,  Narrative:  Start time: 05/29/2016 1:00 PM End time: 05/29/2016 1:05 PM Injection made incrementally with aspirations every 5 mL.  Performed by: Personally  Anesthesiologist: Linna Caprice, Oral Remache

## 2016-05-29 NOTE — Progress Notes (Signed)
Report given to lauren reynolds rn as caregiver

## 2016-05-29 NOTE — Anesthesia Procedure Notes (Signed)
Procedure Name: Intubation Date/Time: 05/29/2016 11:13 AM Performed by: Mervyn Gay Pre-anesthesia Checklist: Patient identified, Patient being monitored, Timeout performed, Emergency Drugs available and Suction available Patient Re-evaluated:Patient Re-evaluated prior to inductionOxygen Delivery Method: Circle System Utilized Preoxygenation: Pre-oxygenation with 100% oxygen Intubation Type: IV induction Ventilation: Mask ventilation without difficulty Laryngoscope Size: Miller and 3 Grade View: Grade I Tube type: Oral Tube size: 7.5 mm Number of attempts: 1 Airway Equipment and Method: Stylet Placement Confirmation: ETT inserted through vocal cords under direct vision,  positive ETCO2 and breath sounds checked- equal and bilateral Secured at: 21 cm Tube secured with: Tape Dental Injury: Teeth and Oropharynx as per pre-operative assessment

## 2016-05-29 NOTE — Anesthesia Preprocedure Evaluation (Signed)
Anesthesia Evaluation  Patient identified by MRN, date of birth, ID band Patient awake    Reviewed: Allergy & Precautions, NPO status , Patient's Chart, lab work & pertinent test results  Airway Mallampati: II  TM Distance: >3 FB Neck ROM: Full    Dental  (+) Teeth Intact, Dental Advisory Given   Pulmonary    breath sounds clear to auscultation       Cardiovascular  Rhythm:Regular Rate:Normal     Neuro/Psych    GI/Hepatic   Endo/Other    Renal/GU      Musculoskeletal   Abdominal   Peds  Hematology   Anesthesia Other Findings   Reproductive/Obstetrics                             Anesthesia Physical Anesthesia Plan  ASA: II  Anesthesia Plan: General and Regional   Post-op Pain Management:    Induction: Intravenous  Airway Management Planned: Oral ETT  Additional Equipment:   Intra-op Plan:   Post-operative Plan: Extubation in OR  Informed Consent: I have reviewed the patients History and Physical, chart, labs and discussed the procedure including the risks, benefits and alternatives for the proposed anesthesia with the patient or authorized representative who has indicated his/her understanding and acceptance.   Dental advisory given  Plan Discussed with: CRNA and Anesthesiologist  Anesthesia Plan Comments:         Anesthesia Quick Evaluation

## 2016-05-29 NOTE — Anesthesia Procedure Notes (Addendum)
Anesthesia Regional Block: TAP block   Pre-Anesthetic Checklist: ,, timeout performed, Correct Patient, Correct Site, Correct Laterality, Correct Procedure, Correct Position, site marked, Risks and benefits discussed,  Surgical consent,  Pre-op evaluation,  At surgeon's request and post-op pain management  Laterality: Right  Prep: chloraprep       Needles:  Injection technique: Single-shot  Needle Type: Stimulator Needle - 80      Needle Gauge: 22     Additional Needles:   Procedures: ultrasound guided,,,,,,,,  Narrative:  Start time: 05/29/2016 11:00 AM End time: 05/29/2016 11:05 AM Injection made incrementally with aspirations every 5 mL.  Performed by: Personally  Anesthesiologist: Eleaner Dibartolo  Additional Notes: 20 cc 0.75% Ropivacaine injected easily

## 2016-05-29 NOTE — Op Note (Addendum)
Patient Name:           Zachary Nash   Date of Surgery:        05/29/2016  Pre op Diagnosis:      Bilateral inguinal hernias  Post op Diagnosis:    Bilateral inguinal hernias  Procedure:                 Open, Lichtenstein repair of bilateral inguinal hernias with ultra Pro mesh  Surgeon:                     Edsel Petrin. 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 He is referred by Dr. Wende Neighbors in [place] for painful bilateral inguinal hernias.      He has no prior history of hernia. On January 18 he was doing a lot of heavy lifting and straining with the tailgate of his truck and developed bilateral groin pain. He says his testicles have been painful since that time and he has felt some small bulges. Having daily pain. Unable to work. Tolerating diet. When he has pain he feels a little nauseated.     Ultrasound of the groins was negative, but on May 07, 2016 he had CT scan of the abdomen and pelvis which was normal except for small fat containing inguinal hernias, right larger than left. He has been out of work for 3 weeks due to pain.  Following the above workup he was referred to me.     We had a long conversation about his hernias. I offered to do this laparoscopically or open.  He decided to have open repairs.  we will schedule this as an urgent procedure due to daily pain. He will be scheduled for open repair of bilateral inguinal hernias with mesh as an outpatient.  He agrees with this plan.  Operative Findings:       He had bilateral direct inguinal hernias.  The right side was noticeably larger than the left.  The bulge on the right side was the size of a.  The left side was about half that size.  I made a very careful search for indirect sac and did not find one on either side.  I could see the edge of the  peritoneum up inside the internal ring  Procedure in Detail:          The patient underwent a right TAPP block by anesthesia in the holding area.  He had a vasovagal reaction and they plan to do the left TAPP block in the OR.     In the operating room the patient underwent general endotracheal anesthesia with muscle relaxation.  The lower abdomen and genitalia were prepped and draped in a sterile fashion.  Intravenous antibiotics were given.  Surgical timeout was performed.  The skin and subcutaneous tissues were infiltrated with 0.5% Marcaine with epinephrine.     I planned mirror images incisions and drew these with a marking pen.  The technique was essentially identical and so I will simply describe it once.  The right side was repaired first and the left side repaired second.  Inguinal incisions were made with a knife overlying the inguinal canals.  Dissection was carried down through  the subcutaneous tissue, exposing the aponeurosis of the external oblique  the external oblique fascia was incised in the direction of its fibers, opening of the external inguinal ring.  Cord structures were mobilized and encircled with a Penrose drain.  Cremasteric muscle fibers were skeletonized.  There was no evidence of indirect hernia.  There was obvious direct hernia on each side, right side larger than left.     The direct hernias were  reduced and oversewn with a running suture of 2-0 Vicryl to imbricate the tissues.  The floor of the inguinal canals were repaired and reinforced with a three-inch by 6 inch  piece of ultra Pro mesh.  The mesh was trimmed at the corners to accommodate the anatomy.  The mesh was sutured in place with running sutures of 2-0 Prolene and interrupted mattress sutures of 2-0 Prolene.  On each side the mesh was sutured so as to generously overlap the pubic tubercle, then along the inguinal ligament inferiorly.  Medially, superiorly, and superiolaterally several mattress sutures of 2-0 Prolene  were placed.  On each side I incised the mesh laterally so as to wraparound the cord structures at the internal ring.  The tails of the mesh were overlapped laterally and further Prolene sutures were placed laterally.  This provided very secure coverage and repair both medial and lateral to the internal ring but allowed an adequate fingertip opening for the cord structures.  The wounds were irrigated with saline.  Hemostasis was excellent.    The external oblique on each side was closed with a running suture of 2-0 Vicryl, placing the cord structures deep to the external oblique.  Scarpa's fascia was closed with 3-0 Vicryl sutures and the skin incisions were closed with running sutures of 4-0 Monocryl and Dermabond.  The patient tolerated the procedure well was taken to PACU in stable condition.  EBL 25 mL.  Counts correct.  Complications none.    Addendum: The [facility] Reporting website was accessed and the patient's medication history was reviewed.  He was given a prescription for Percocet or pain.  30 tablets.  No refill.    Edsel Petrin. Dalbert Batman, M.D., FACS General and Minimally Invasive Surgery Breast and Colorectal Surgery  05/29/2016 1:15 PM

## 2016-05-29 NOTE — Transfer of Care (Signed)
Immediate Anesthesia Transfer of Care Note  Patient: Zachary Nash  Procedure(s) Performed: Procedure(s): OPEN HERNIA REPAIR INGUINAL ADULT BILATERAL WITH insertion of MESH (Bilateral)  Patient Location: PACU  Anesthesia Type:General  Level of Consciousness: sedated and patient cooperative  Airway & Oxygen Therapy: Patient Spontanous Breathing and Patient connected to face mask oxygen  Post-op Assessment: Report given to RN and Post -op Vital signs reviewed and stable  Post vital signs: Reviewed and stable  Last Vitals:  Vitals:   05/29/16 1030 05/29/16 1045  BP: 127/86 117/85  Pulse: 90 97  Resp: 12 (!) 27  Temp:      Last Pain:  Vitals:   05/29/16 0817  TempSrc: Oral      Patients Stated Pain Goal: 3 (58/25/18 9842)  Complications: No apparent anesthesia complications

## 2016-05-29 NOTE — Anesthesia Postprocedure Evaluation (Addendum)
Anesthesia Post Note  Patient: Zachary Nash  Procedure(s) Performed: Procedure(s) (LRB): OPEN HERNIA REPAIR INGUINAL ADULT BILATERAL WITH insertion of MESH (Bilateral)  Patient location during evaluation: PACU Anesthesia Type: Regional Level of consciousness: awake, awake and alert and oriented Vital Signs Assessment: post-procedure vital signs reviewed and stable Respiratory status: spontaneous breathing, nonlabored ventilation and respiratory function stable Cardiovascular status: blood pressure returned to baseline Anesthetic complications: no       Last Vitals:  Vitals:   05/29/16 1600 05/29/16 1645  BP: 123/84 117/84  Pulse: 93   Resp: 15   Temp:      Last Pain:  Vitals:   05/29/16 1515  TempSrc:   PainSc: Asleep                 [name]

## 2016-06-01 ENCOUNTER — Encounter (HOSPITAL_COMMUNITY): Payer: Self-pay | Admitting: General Surgery

## 2016-09-03 ENCOUNTER — Emergency Department (HOSPITAL_COMMUNITY): Payer: Self-pay

## 2016-09-03 ENCOUNTER — Encounter (HOSPITAL_COMMUNITY): Payer: Self-pay | Admitting: *Deleted

## 2016-09-03 ENCOUNTER — Emergency Department (HOSPITAL_COMMUNITY)
Admission: EM | Admit: 2016-09-03 | Discharge: 2016-09-03 | Disposition: A | Discharge status: Home / Self Care | Payer: Self-pay | Attending: Emergency Medicine | Admitting: Emergency Medicine

## 2016-09-03 DIAGNOSIS — J45909 Unspecified asthma, uncomplicated: Secondary | ICD-10-CM | POA: Insufficient documentation

## 2016-09-03 DIAGNOSIS — R1030 Lower abdominal pain, unspecified: Secondary | ICD-10-CM | POA: Insufficient documentation

## 2016-09-03 DIAGNOSIS — Z880 Allergy status to penicillin: Secondary | ICD-10-CM | POA: Insufficient documentation

## 2016-09-03 DIAGNOSIS — N50819 Testicular pain, unspecified: Secondary | ICD-10-CM | POA: Insufficient documentation

## 2016-09-03 DIAGNOSIS — Z7902 Long term (current) use of antithrombotics/antiplatelets: Secondary | ICD-10-CM | POA: Insufficient documentation

## 2016-09-03 LAB — CBC WITH DIFFERENTIAL/PLATELET
Basophils Absolute: 0 10*3/uL (ref 0.0–0.1)
Basophils Relative: 1 %
Eosinophils Absolute: 0.2 10*3/uL (ref 0.0–0.7)
Eosinophils Relative: 3 %
HCT: 42.1 % (ref 39.0–52.0)
Hemoglobin: 14.9 g/dL (ref 13.0–17.0)
Lymphocytes Relative: 30 %
Lymphs Abs: 1.7 10*3/uL (ref 0.7–4.0)
MCH: 28.8 pg (ref 26.0–34.0)
MCHC: 35.4 g/dL (ref 30.0–36.0)
MCV: 81.4 fL (ref 78.0–100.0)
Monocytes Absolute: 0.5 10*3/uL (ref 0.1–1.0)
Monocytes Relative: 10 %
Neutro Abs: 3.2 10*3/uL (ref 1.7–7.7)
Neutrophils Relative %: 56 %
Platelets: 193 10*3/uL (ref 150–400)
RBC: 5.17 MIL/uL (ref 4.22–5.81)
RDW: 12.6 % (ref 11.5–15.5)
WBC: 5.7 10*3/uL (ref 4.0–10.5)

## 2016-09-03 LAB — BASIC METABOLIC PANEL
Anion gap: 7 (ref 5–15)
BUN: 30 mg/dL — ABNORMAL HIGH (ref 6–20)
CO2: 24 mmol/L (ref 22–32)
Calcium: 9 mg/dL (ref 8.9–10.3)
Chloride: 109 mmol/L (ref 101–111)
Creatinine, Ser: 1.32 mg/dL — ABNORMAL HIGH (ref 0.61–1.24)
GFR calc Af Amer: >60 mL/min (ref >60)
GFR calc non Af Amer: >60 mL/min (ref >60)
Glucose, Bld: 93 mg/dL (ref 65–99)
Potassium: 3.7 mmol/L (ref 3.5–5.1)
Sodium: 140 mmol/L (ref 135–145)

## 2016-09-03 LAB — URINALYSIS, ROUTINE W REFLEX MICROSCOPIC
Bilirubin Urine: NEGATIVE
Glucose, UA: NEGATIVE mg/dL
Hgb urine dipstick: NEGATIVE
Ketones, ur: NEGATIVE mg/dL
Leukocytes, UA: NEGATIVE
Nitrite: NEGATIVE
Protein, ur: NEGATIVE mg/dL
Specific Gravity, Urine: 1.04 — ABNORMAL HIGH (ref 1.005–1.030)
pH: 5 (ref 5.0–8.0)

## 2016-09-03 MED ORDER — IOPAMIDOL (ISOVUE-300) INJECTION 61%
100.0000 mL | Freq: Once | INTRAVENOUS | Status: AC | PRN
  Administered 2016-09-03: 100 mL via INTRAVENOUS

## 2016-09-03 MED ORDER — IOPAMIDOL (ISOVUE-300) INJECTION 61%
INTRAVENOUS | Status: AC
  Filled 2016-09-03: qty 100

## 2016-09-03 MED ORDER — MORPHINE SULFATE (PF) 2 MG/ML IV SOLN
4.0000 mg | Freq: Once | INTRAVENOUS | Status: AC
Start: 2016-09-03 — End: 2016-09-03
  Administered 2016-09-03: 2 mg via INTRAVENOUS
  Filled 2016-09-03: qty 2

## 2016-09-03 MED ORDER — OXYCODONE-ACETAMINOPHEN 5-325 MG PO TABS
1.0000 | ORAL_TABLET | ORAL | Status: DC | PRN
  Administered 2016-09-03: 1 via ORAL
  Filled 2016-09-03 (×2): qty 1

## 2016-09-03 MED ORDER — SODIUM CHLORIDE 0.9 % IV BOLUS (SEPSIS)
1000.0000 mL | Freq: Once | INTRAVENOUS | Status: AC
  Administered 2016-09-03: 1000 mL via INTRAVENOUS

## 2016-09-03 NOTE — ED Provider Notes (Signed)
[facility] DEPT Provider Note   CSN: [id number] Arrival date & time: 09/03/16  1058     History   Chief Complaint Chief Complaint  Patient presents with  . Groin Pain    HPI Zachary Nash is a 46 y.o. male with recent history of bilateral inguinal hernia repair who presents with acute onset groin pain began this morning. Patient describes the sensation as everything pushing down. He reports that pain improved with Aleve, however it was worse with movement. Patient has associated "puffy" feeling over his pelvis near his surgery scars. He has had tenderness and swelling to his bilateral testicles and pain to his perineal area. He denies any fevers, chest pain, shortness of breath, abdominal pain, urinary symptoms, penile discharge. He has no concern for STD exposure. Patient was sent by his surgeon, Dr. Dalbert Batman, who sent the patient for a STAT CT of the pelvis with contrast.  HPI  Past Medical History:  Diagnosis Date  . Anxiety   . Asthma   . Bilateral inguinal hernia 05/29/2016  . Complication of anesthesia    states he stopped breathing one time with anesthesia  . Constipation   . Fatty liver   . GERD (gastroesophageal reflux disease)   . History of kidney stones   . Pneumonia   . Radicular pain of left lower extremity     Patient Active Problem List   Diagnosis Date Noted  . Bilateral inguinal hernia 05/29/2016  . 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 Hyperglycemia 05/18/2013  . Radicular pain of left lower extremity   . Dizziness 05/16/2013    Past Surgical History:  Procedure Laterality Date  . CHOLECYSTECTOMY    . INGUINAL HERNIA REPAIR Bilateral 05/29/2016   Procedure: OPEN HERNIA REPAIR INGUINAL ADULT BILATERAL WITH insertion of MESH;  Surgeon: Fanny Skates, MD;  Location: [place];  Service: General;  Laterality: Bilateral;  . Lipoma removed         Home Medications    Prior to Admission medications   Medication Sig Start Date End Date Taking?  Authorizing Provider  albuterol (PROVENTIL HFA;VENTOLIN HFA) 108 (90 BASE) MCG/ACT inhaler Inhale 2 puffs into the lungs every 6 (six) hours as needed for wheezing.   Yes [provider]  Multiple Vitamins-Minerals (ALIVE MENS ENERGY PO) Take 1 tablet by mouth daily.   Yes [provider]  omeprazole (PRILOSEC) 20 MG capsule Take 20 mg by mouth daily.   Yes [provider]  oxyCODONE-acetaminophen (PERCOCET) 7.5-325 MG tablet Take 1 tablet by mouth every 4 (four) hours as needed for severe pain. Patient not taking: Reported on 09/03/2016 05/29/16   Fanny Skates, MD    Family History Family History  Problem Relation Age of Onset  . Heart failure Father   . Heart attack Father   . Ovarian cancer Mother   . Arthritis Mother   . Anxiety disorder Mother     Social History Social History  Substance Use Topics  . Smoking status: Never Smoker  . Smokeless tobacco: Never Used  . Alcohol use No     Allergies   Penicillins   Review of Systems Review of Systems  Constitutional: Negative for chills and fever.  HENT: Negative for facial swelling and sore throat.   Respiratory: Negative for shortness of breath.   Cardiovascular: Negative for chest pain.  Gastrointestinal: Negative for abdominal pain, nausea and vomiting.  Genitourinary: Positive for scrotal swelling and testicular pain. Negative for discharge, dysuria, penile pain and penile swelling.  Musculoskeletal: Negative  for back pain.  Skin: Negative for rash and wound.  Neurological: Negative for headaches.  Psychiatric/Behavioral: The patient is not nervous/anxious.      Physical Exam Updated Vital Signs BP 124/89 (BP Location: Left Arm)   Pulse 91   Temp 98.4 F (36.9 C) (Oral)   Resp 20   Ht 5' 11"  (1.803 m)   Wt 89.9 kg (198 lb 4.8 oz)   SpO2 100%   BMI 27.66 kg/m   Physical Exam  Constitutional: He appears well-developed and well-nourished. No distress.  HENT:  Head: Normocephalic  and atraumatic.  Mouth/Throat: Oropharynx is clear and moist. No oropharyngeal exudate.  Eyes: Conjunctivae are normal. Pupils are equal, round, and reactive to light. Right eye exhibits no discharge. Left eye exhibits no discharge. No scleral icterus.  Neck: Normal range of motion. Neck supple. No thyromegaly present.  Cardiovascular: Normal rate, regular rhythm, normal heart sounds and intact distal pulses.  Exam reveals no gallop and no friction rub.   No murmur heard. Pulmonary/Chest: Effort normal and breath sounds normal. No stridor. No respiratory distress. He has no wheezes. He has no rales.  Abdominal: Soft. Bowel sounds are normal. He exhibits no distension. There is no tenderness. There is no rebound and no guarding.  Genitourinary: Penis normal.    Right testis shows tenderness. Right testis shows no mass and no swelling. Left testis shows tenderness. Left testis shows no mass and no swelling. Circumcised.     Genitourinary Comments: Tenderness with palpation of bilateral inguinal canals, no obvious hernia palpated, tenderness to the perineum, mild erythema noted to scrotum, but no significant edema  Musculoskeletal: He exhibits no edema.  Lymphadenopathy:    He has no cervical adenopathy.  Neurological: He is alert. Coordination normal.  Skin: Skin is warm and dry. No rash noted. He is not diaphoretic. No pallor.  Psychiatric: He has a normal mood and affect.  Nursing note and vitals reviewed.    ED Treatments / Results  Labs (all labs ordered are listed, but only abnormal results are displayed) Labs Reviewed  BASIC METABOLIC PANEL - Abnormal; Notable for the following:       Result Value   BUN 30 (*)    Creatinine, Ser 1.32 (*)    All other components within normal limits  CBC WITH DIFFERENTIAL/PLATELET  URINALYSIS, ROUTINE W REFLEX MICROSCOPIC    EKG  EKG Interpretation None       Radiology Ct Pelvis W Contrast  Result Date: 09/03/2016 CLINICAL DATA:   178m isovue300 Woke up with severe bilateral groin pain. Hx repair in march 2018. "Pt complains of groin pain since lifting his tailgate this morning. Pt had inguinal hernia repair in march. Pt reports "puffiness" in groin area."^103mISOVUE-300 IOPAMIDOL (ISOVUE-300) INJECTION 61% EXAM: CT PELVIS WITH CONTRAST TECHNIQUE: Multidetector CT imaging of the pelvis was performed using the standard protocol following the bolus administration of intravenous contrast. CONTRAST:  10039mSOVUE-300 IOPAMIDOL (ISOVUE-300) INJECTION 61% COMPARISON:  CT of the abdomen and pelvis on 12/10/2014 FINDINGS: Urinary Tract: The distal ureters and urinary bladder have a normal appearance. Visualized portion of the urethra is unremarkable. Bowel:  Visualized bowel loops are unremarkable in appearance. Vascular/Lymphatic: No pathologically enlarged lymph nodes. No significant vascular abnormality seen. Reproductive:  Normal appearance the prostate and seminal vesicles. Other: There has been interval repair of bilateral fat containing inguinal hernias. No recurrence of hernia or other inguinal mass identified. Musculoskeletal: No suspicious bone lesions identified. IMPRESSION: 1.  No evidence for acute  abnormality. 2. No evidence for recurrence of inguinal hernias. Electronically Signed   By: Nolon Nations M.D.   On: 09/03/2016 15:22    Procedures Procedures (including critical care time)  Medications Ordered in ED Medications  oxyCODONE-acetaminophen (PERCOCET/ROXICET) 5-325 MG per tablet 1 tablet (1 tablet Oral Given 09/03/16 1132)  morphine 2 MG/ML injection 4 mg (2 mg Intravenous Given 09/03/16 1359)  iopamidol (ISOVUE-300) 61 % injection 100 mL (100 mLs Intravenous Contrast Given 09/03/16 1503)  sodium chloride 0.9 % bolus 1,000 mL (1,000 mLs Intravenous New Bag/Given 09/03/16 1546)     Initial Impression / Assessment and Plan / ED Course  I have reviewed the triage vital signs and the nursing notes.  Pertinent labs &  imaging results that were available during my care of the patient were reviewed by me and considered in my medical decision making (see chart for details).     CBC unremarkable. BMP shows BUN 30, creatinine 1.32. GFR is greater than 60. This is most likely due to dehydration. 1 L fluid bolus given in ED. CT pelvis with contrast shows no acute findings or new hernia. Ultrasound of the scrotum and UA are pending. At shift change, patient care transferred to Carmon Sails, PA-C for continued evaluation, follow up of scrotal US and UA and determination of disposition. Anticipate discharge if [place] and UA negative. Follow up to PCP or surgeon as indicated per results. Patient also evaluated by Dr. Winfred Leeds who guided the patient's pain is meant and agrees with plan.   Final Clinical Impressions(s) / ED Diagnoses   Final diagnoses:  Inguinal pain, unspecified laterality  Testicular pain, unspecified    New Prescriptions New Prescriptions   No medications on file     Frederica Kuster, PA-C 09/03/16 1558    Orlie Dakin, MD 09/03/16 1601

## 2016-09-03 NOTE — ED Provider Notes (Signed)
Plan of bilateral groin pain, scrotal pain and pain at perineum immediately posterior to scrotum onset 6 AM today. Pain is much improved since treatment here. Denies any abdominal pain nausea vomiting. On exam alert no distress abdomen soft nontender. Penis normal. Scrotum is mildly tender bilaterally. Left side greater than right. Testes appear normal life. He appears grossly normal.   Zachary Dakin, MD 09/03/16 1534

## 2016-09-03 NOTE — Discharge Instructions (Signed)
Your CT scan, ultrasound, lab work and urinalysis were overall reassuring.  As we discussed, your ultrasound showed some incidentally found cysts to your testicles. These are likely benign and not the cause of your pain.   Here are your ultrasound results:  FINDINGS:  Right testicle    Measurements: 4.4 x 2.1 x 3.0 cm. No mass or microlithiasis  visualized.     Left testicle     Measurements: 4.1 x 2.0 x 2.9 cm. No mass or microlithiasis  visualized.     Right epididymis:  Epididymal cysts with the largest measuring 3 mm.     Left epididymis:  An epididymal cyst measures 3 mm.     Hydrocele:  Minimal hydrocele on the right.     Varicocele:  None visualized.     Pulsed Doppler interrogation of both testes demonstrates normal low  resistance arterial and venous waveforms bilaterally.   Take tylenol or ibuprofen for pain. Rest and avoid activities that worsen pain.   Follow up with your primary care provider within 3-5 days for re-evaluation and further symptom control.   Return to ED if symptoms worsen or you develop any new, concerning symptoms

## 2016-09-03 NOTE — ED Triage Notes (Signed)
Pt complains of groin pain since lifting his tailgate this morning. Pt had inguinal hernia repair in march. Pt reports "puffiness" in groin area.

## 2016-09-03 NOTE — ED Provider Notes (Signed)
Patient was handed off to me by previous ED PA Law at shift change.  Please see previous ED note for full HPI and ROS. Briefly, patient presents to ED with sudden onset bilateral inguinal and testicular pain associated with testicular swelling since this morning. Patient had bilateral inguinal hernia repair by Dr. Dalbert Batman on 05/29/2016. No urinary symptoms. No risk factors for STD. No h/o testicular torsion.   At shift change, patient is pending ultrasound and u/a. Anticipate discharge if no UTI or abnormality on ultrasound.   Physical Exam  BP 124/88   Pulse 69   Temp 98.4 F (36.9 C) (Oral)   Resp 18   Ht 5' 11"  (1.803 m)   Wt 89.9 kg (198 lb 4.8 oz)   SpO2 100%   BMI 27.66 kg/m   Physical Exam  Constitutional: He is oriented to person, place, and time. He appears well-developed and well-nourished. No distress.  No distress.  HENT:  Head: Normocephalic and atraumatic.  Cardiovascular: Normal rate, regular rhythm and normal heart sounds.   No murmur heard. Pulmonary/Chest: Effort normal and breath sounds normal.  Abdominal: Soft. There is tenderness.  Tenderness to bilateral inguinal regions, testicles and perineum. Mild edema. No fluctuance or cellulitis.   Musculoskeletal: Normal range of motion.  Neurological: He is alert and oriented to person, place, and time.  Skin: Skin is warm and dry.  Psychiatric: He has a normal mood and affect. His behavior is normal. Judgment and thought content normal.    ED Course  Procedures  MDM  ED lab work reviewed. CBC, BMP, U/A unremarkable. CT scan and U/S without surgical emergencies.  Incidental findings of epididymal cysts, discussed reassuring work up with patient. Patient's pain was well controlled in ED. Pain is exacerbated by standing up.  Will discharge with high dose NSAIDs, scrotal support and PCP f/u for re-evaluation of symptoms and pain control.  Strict ED return precautions given.        Kinnie Feil, PA-C 09/03/16  [place], MD 09/04/16 [phone]

## 2016-11-19 NOTE — Addendum Note (Signed)
Addendum  created 11/19/16 1122 by Roberts Gaudy, MD   Sign clinical note

## 2017-03-19 ENCOUNTER — Telehealth: Payer: Self-pay | Admitting: Gastroenterology

## 2017-03-19 NOTE — Telephone Encounter (Signed)
Received phone call from patient at 1730. Patient is old Dr. Laural Golden patient. Has been years since seen. EGD 2006 for dysphagia. 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  One month recurrent dysphagia to solid foods. On omeprazole 31m daily but not helping reflux. Suggested trying Nexium, Prevacid, or increase omeprazole to bid.   Reports that he called Dr. ROlevia Perchesoffice yesterday and was told couldn't be seen any time soon and to call on call provider if needed.   Patient denies food obstruction type symptoms. Complains of solid food dysphagia. Advised if he developed obstruction type symptoms to go to ER. For now eat soft foods, avoid raw veggies, meats, bread.   PLEASE MAKE PATIENT AN APPOINTMENT FOR DYSPHAGIA.

## 2017-03-20 NOTE — Telephone Encounter (Signed)
Talked with patient. He has been having swallowing difficulty for at least 2 months. He is on omeprazole with satisfactory control of heartburn. We will schedule patient for EGD ED on 03/24/2017. Patient can have clear liquid breakfast on 03/24/2017 and n.p.o. after that. My office will contact him and let him know what time to come to the hospital.

## 2017-03-24 ENCOUNTER — Other Ambulatory Visit: Payer: Self-pay

## 2017-03-24 ENCOUNTER — Encounter (HOSPITAL_COMMUNITY)
Admission: AD | Disposition: A | Discharge status: Home / Self Care | Payer: Self-pay | Source: Ambulatory Visit | Attending: Internal Medicine

## 2017-03-24 ENCOUNTER — Other Ambulatory Visit (INDEPENDENT_AMBULATORY_CARE_PROVIDER_SITE_OTHER): Payer: Self-pay | Admitting: Internal Medicine

## 2017-03-24 ENCOUNTER — Ambulatory Visit (HOSPITAL_COMMUNITY)
Admission: AD | Admit: 2017-03-24 | Discharge: 2017-03-24 | Disposition: A | Discharge status: Home / Self Care | Payer: BLUE CROSS/BLUE SHIELD | Source: Ambulatory Visit | Attending: Internal Medicine | Admitting: Internal Medicine

## 2017-03-24 ENCOUNTER — Encounter (HOSPITAL_COMMUNITY): Payer: Self-pay

## 2017-03-24 DIAGNOSIS — Z9049 Acquired absence of other specified parts of digestive tract: Secondary | ICD-10-CM | POA: Insufficient documentation

## 2017-03-24 DIAGNOSIS — Z8261 Family history of arthritis: Secondary | ICD-10-CM | POA: Diagnosis not present

## 2017-03-24 DIAGNOSIS — Z8041 Family history of malignant neoplasm of ovary: Secondary | ICD-10-CM | POA: Diagnosis not present

## 2017-03-24 DIAGNOSIS — R1314 Dysphagia, pharyngoesophageal phase: Secondary | ICD-10-CM | POA: Insufficient documentation

## 2017-03-24 DIAGNOSIS — K760 Fatty (change of) liver, not elsewhere classified: Secondary | ICD-10-CM | POA: Insufficient documentation

## 2017-03-24 DIAGNOSIS — K5900 Constipation, unspecified: Secondary | ICD-10-CM | POA: Insufficient documentation

## 2017-03-24 DIAGNOSIS — Z79899 Other long term (current) drug therapy: Secondary | ICD-10-CM | POA: Diagnosis not present

## 2017-03-24 DIAGNOSIS — M5410 Radiculopathy, site unspecified: Secondary | ICD-10-CM | POA: Insufficient documentation

## 2017-03-24 DIAGNOSIS — Z880 Allergy status to penicillin: Secondary | ICD-10-CM | POA: Insufficient documentation

## 2017-03-24 DIAGNOSIS — Z818 Family history of other mental and behavioral disorders: Secondary | ICD-10-CM | POA: Insufficient documentation

## 2017-03-24 DIAGNOSIS — J45909 Unspecified asthma, uncomplicated: Secondary | ICD-10-CM | POA: Insufficient documentation

## 2017-03-24 DIAGNOSIS — F419 Anxiety disorder, unspecified: Secondary | ICD-10-CM | POA: Insufficient documentation

## 2017-03-24 DIAGNOSIS — R1310 Dysphagia, unspecified: Secondary | ICD-10-CM | POA: Insufficient documentation

## 2017-03-24 DIAGNOSIS — Z8249 Family history of ischemic heart disease and other diseases of the circulatory system: Secondary | ICD-10-CM | POA: Diagnosis not present

## 2017-03-24 DIAGNOSIS — Z7951 Long term (current) use of inhaled steroids: Secondary | ICD-10-CM | POA: Diagnosis not present

## 2017-03-24 DIAGNOSIS — Z87442 Personal history of urinary calculi: Secondary | ICD-10-CM | POA: Diagnosis not present

## 2017-03-24 DIAGNOSIS — K210 Gastro-esophageal reflux disease with esophagitis: Secondary | ICD-10-CM | POA: Diagnosis not present

## 2017-03-24 DIAGNOSIS — K222 Esophageal obstruction: Secondary | ICD-10-CM | POA: Insufficient documentation

## 2017-03-24 HISTORY — PX: ESOPHAGOGASTRODUODENOSCOPY: SHX5428

## 2017-03-24 HISTORY — DX: Other specified postprocedural states: Z98.890

## 2017-03-24 HISTORY — PX: ESOPHAGEAL DILATION: SHX303

## 2017-03-24 HISTORY — DX: Nausea with vomiting, unspecified: R11.2

## 2017-03-24 SURGERY — EGD (ESOPHAGOGASTRODUODENOSCOPY)
Anesthesia: Moderate Sedation

## 2017-03-24 MED ORDER — SODIUM CHLORIDE 0.9 % IV SOLN
INTRAVENOUS | Status: DC
  Administered 2017-03-24: 12:00:00 via INTRAVENOUS

## 2017-03-24 MED ORDER — ONDANSETRON 4 MG PO TBDP
ORAL_TABLET | ORAL | Status: AC
  Filled 2017-03-24: qty 1

## 2017-03-24 MED ORDER — PANTOPRAZOLE SODIUM 40 MG PO TBEC: 40.0000 mg | DELAYED_RELEASE_TABLET | Freq: Every day | ORAL | 1 refills | Status: DC

## 2017-03-24 MED ORDER — ONDANSETRON HCL 4 MG PO TABS
4.0000 mg | ORAL_TABLET | Freq: Once | ORAL | Status: AC
  Administered 2017-03-24: 4 mg via ORAL
  Filled 2017-03-24: qty 1

## 2017-03-24 MED ORDER — FAMOTIDINE 20 MG PO TABS: 20.0000 mg | ORAL_TABLET | Freq: Every day | ORAL | Status: DC

## 2017-03-24 MED ORDER — MIDAZOLAM HCL 5 MG/5ML IJ SOLN
INTRAMUSCULAR | Status: DC | PRN
  Administered 2017-03-24 (×4): 2 mg via INTRAVENOUS

## 2017-03-24 MED ORDER — MEPERIDINE HCL 50 MG/ML IJ SOLN
INTRAMUSCULAR | Status: DC | PRN
  Administered 2017-03-24 (×3): 25 mg via INTRAVENOUS

## 2017-03-24 MED ORDER — MIDAZOLAM HCL 5 MG/5ML IJ SOLN
INTRAMUSCULAR | Status: AC
  Filled 2017-03-24: qty 10

## 2017-03-24 MED ORDER — LIDOCAINE VISCOUS 2 % MT SOLN
OROMUCOSAL | Status: DC | PRN
  Administered 2017-03-24 (×2): 5 mL via OROMUCOSAL

## 2017-03-24 MED ORDER — LIDOCAINE VISCOUS 2 % MT SOLN
OROMUCOSAL | Status: AC
  Filled 2017-03-24: qty 15

## 2017-03-24 MED ORDER — MEPERIDINE HCL 50 MG/ML IJ SOLN
INTRAMUSCULAR | Status: AC
  Filled 2017-03-24: qty 1

## 2017-03-24 MED ORDER — MIDAZOLAM HCL 5 MG/5ML IJ SOLN
INTRAMUSCULAR | Status: DC
Start: 2017-03-24 — End: 2017-03-24
  Filled 2017-03-24: qty 10

## 2017-03-24 NOTE — Discharge Instructions (Signed)
°  Discontinue omeprazole. Begin pantoprazole 40 mg by mouth 30 minutes before breakfast daily. Pepcid OTC 20 mg by mouth daily at bedtime. Resume other medications and diet as before. No driving for 24 hours. Office visit in 3 months.  March 26 @ 9:00 am with Coralyn Mark.  Please arrive @ 8:45.   Upper Endoscopy, Care After Refer to this sheet in the next few weeks. These instructions provide you with information about caring for yourself after your procedure. Your health care provider may also give you more specific instructions. Your treatment has been planned according to current medical practices, but problems sometimes occur. Call your health care provider if you have any problems or questions after your procedure. What can I expect after the procedure? After the procedure, it is common to have:  A sore throat.  Bloating.  Nausea.  Follow these instructions at home:  Follow instructions from your health care provider about what to eat or drink after your procedure.  Return to your normal activities as told by your health care provider. Ask your health care provider what activities are safe for you.  Take over-the-counter and prescription medicines only as told by your health care provider.  Do not drive for 24 hours if you received a sedative.  Keep all follow-up visits as told by your health care provider. This is important. Contact a health care provider if:  You have a sore throat that lasts longer than one day.  You have trouble swallowing. Get help right away if:  You have a fever.  You vomit blood or your vomit looks like coffee grounds.  You have bloody, black, or tarry stools.  You have a severe sore throat or you cannot swallow.  You have difficulty breathing.  You have severe pain in your chest or belly. This information is not intended to replace advice given to you by your health care provider. Make sure you discuss any questions you have with your health  care provider. Document Released: 09/15/2011 Document Revised: 08/22/2015 Document Reviewed: 12/27/2014 Elsevier Interactive Patient Education  [employer].

## 2017-03-24 NOTE — Telephone Encounter (Signed)
EGD/ED sch'd for today at 1155 (1050), patient aware

## 2017-03-24 NOTE — H&P (Signed)
Zachary Nash is an 46 y.o. male.   Chief Complaint: Patient is here for EGD and ED. HPI: Patient is 46 year old Caucasian 7-year history of GERD as well as history of esophageal stricture which was last dilated about 12 years ago who is been experiencing solid food dysphagia for the last 2 months.  He has had 2 or 3 episodes of food impaction relieved with regurgitation.  He points to sternal area as site of bolus obstruction.  He states heartburn is well controlled with therapy.  He has lost over 20 pounds for the last few months voluntarily.  Past Medical History:  Diagnosis Date  . Anxiety   . Asthma   . Bilateral inguinal hernia 05/29/2016  . Complication of anesthesia    states he stopped breathing one time with anesthesia  . Constipation   . Fatty liver   . GERD (gastroesophageal reflux disease)   . History of kidney stones   . Pneumonia   . PONV (postoperative nausea and vomiting)   . Radicular pain of left lower extremity     Past Surgical History:  Procedure Laterality Date  . CHOLECYSTECTOMY    . INGUINAL HERNIA REPAIR Bilateral 05/29/2016   Procedure: OPEN HERNIA REPAIR INGUINAL ADULT BILATERAL WITH insertion of MESH;  Surgeon: Fanny Skates, MD;  Location: [facility];  Service: General;  Laterality: Bilateral;  . Lipoma removed      Family History  Problem Relation Age of Onset  . Heart failure Father   . Heart attack Father   . Ovarian cancer Mother   . Arthritis Mother   . Anxiety disorder Mother    Social History:  reports that  has never smoked. he has never used smokeless tobacco. He reports that he does not drink alcohol or use drugs.  Allergies:  Allergies  Allergen Reactions  . Penicillins Anaphylaxis and Swelling    Has patient had a PCN reaction causing immediate rash, facial/tongue/throat swelling, SOB or lightheadedness with hypotension: yes Has patient had a PCN reaction causing severe rash involving mucus membranes or skin necrosis: yes Has patient had  a PCN reaction that required hospitalization: no Has patient had a PCN reaction occurring within the last 10 years: no If all of the above answers are "NO", then may proceed with Cephalosporin use.    Medications Prior to Admission  Medication Sig Dispense Refill  . albuterol (PROVENTIL HFA;VENTOLIN HFA) 108 (90 BASE) MCG/ACT inhaler Inhale 2 puffs into the lungs every 6 (six) hours as needed for wheezing.    . Multiple Vitamins-Minerals (ALIVE MENS ENERGY PO) Take 1 tablet by mouth daily.    Marland Kitchen omeprazole (PRILOSEC) 20 MG capsule Take 20 mg by mouth daily.    Marland Kitchen oxyCODONE-acetaminophen (PERCOCET) 7.5-325 MG tablet Take 1 tablet by mouth every 4 (four) hours as needed for severe pain. (Patient not taking: Reported on 09/03/2016) 30 tablet 0    No results found for this or any previous visit (from the past 48 hour(s)). No results found.  ROS  Blood pressure 115/86, pulse 76, temperature 98 F (36.7 C), temperature source Oral, resp. rate 10, height 5' 11"  (1.803 m), weight 180 lb (81.6 kg), SpO2 100 %. Physical Exam  Constitutional: He appears well-developed and well-nourished.  HENT:  Mouth/Throat: Oropharynx is clear and moist.  Eyes: Conjunctivae are normal. No scleral icterus.  Neck: No thyromegaly present.  Cardiovascular: Normal rate, regular rhythm and normal heart sounds.  No murmur heard. Respiratory: Effort normal and breath sounds normal.  GI:  Soft. He exhibits no distension and no mass. There is no tenderness.  Musculoskeletal: He exhibits no edema.  Lymphadenopathy:    He has no cervical adenopathy.  Neurological: He is alert.  Skin: Skin is warm and dry.     Assessment/Plan Solid food dysphagia. History of GERD complicated by esophageal stricture. EGD with ED.  Hildred Laser, MD 03/24/2017, 12:11 PM

## 2017-03-24 NOTE — Telephone Encounter (Signed)
Noted  

## 2017-03-24 NOTE — Progress Notes (Signed)
Zachary Nash was at [facility] on 03/24/17 for a procedure and cannot return to work until 3:30pm on 03/25/17.

## 2017-03-24 NOTE — Op Note (Signed)
[facility] Patient Name: Zachary Nash Procedure Date: 03/24/2017 11:56 AM MRN: [id number] Date of Birth: 12/29/1970 Attending MD: Hildred Laser , MD CSN: [id number] Age: 46 Admit Type: Outpatient Procedure:                Upper GI endoscopy Indications:              Esophageal dysphagia, Esophageal reflux Providers:                Hildred Laser, MD, Otis Peak B. Sharon Seller, RN, Randa Spike, Technician Referring MD:             Delphina Cahill, MD Medicines:                Lidocaine spray, Meperidine 75 mg IV, Midazolam 8                            mg IV Complications:            No immediate complications. Estimated Blood Loss:     Estimated blood loss was minimal. Procedure:                Pre-Anesthesia Assessment:                           - Prior to the procedure, a History and Physical                            was performed, and patient medications and                            allergies were reviewed. The patient's tolerance of                            previous anesthesia was also reviewed. The risks                            and benefits of the procedure and the sedation                            options and risks were discussed with the patient.                            All questions were answered, and informed consent                            was obtained. Prior Anticoagulants: The patient has                            taken no previous anticoagulant or antiplatelet                            agents. ASA Grade Assessment: II - A patient with  mild systemic disease. After reviewing the risks                            and benefits, the patient was deemed in                            satisfactory condition to undergo the procedure.                           After obtaining informed consent, the endoscope was                            passed under direct vision. Throughout the   procedure, the patient's blood pressure, pulse, and                            oxygen saturations were monitored continuously. The                            EG-[id number] ([id number]) scope was introduced through the                            mouth, and advanced to the second part of duodenum.                            The upper GI endoscopy was accomplished without                            difficulty. The patient tolerated the procedure                            well. Scope In: 12:32:08 PM Scope Out: 12:40:37 PM Total Procedure Duration: 0 hours 8 minutes 29 seconds  Findings:      The proximal esophagus and mid esophagus were normal.      LA Grade B (one or more mucosal breaks greater than 5 mm, not extending       between the tops of two mucosal folds) esophagitis was found 36 to 38 cm       from the incisors.      One moderate benign-appearing, intrinsic stenosis was found 38 cm from       the incisors. This measured 1.3 cm (inner diameter) x less than one cm       (in length) and was traversed. A TTS dilator was passed through the       scope. Dilation with a 15-16.5-18 mm balloon dilator was performed to 15       mm, 16.5 mm and 18 mm. The dilation site was examined and showed       moderate improvement in luminal narrowing and no perforation.      The entire examined stomach was normal.      The duodenal bulb and second portion of the duodenum were normal. Impression:               - Normal proximal esophagus and mid esophagus.                           -  LA Grade B reflux esophagitis.                           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                            Dilated.                           - Normal stomach.                           - Normal duodenal bulb and second portion of the                            duodenum.                           - No specimens collected. Moderate Sedation:      Moderate (conscious) sedation was administered by the  endoscopy nurse       and supervised by the endoscopist. The following parameters were       monitored: oxygen saturation, heart rate, blood pressure, CO2       capnography and response to care. Total physician intraservice time was       22 minutes. Recommendation:           - Patient has a contact number available for                            emergencies. The signs and symptoms of potential                            delayed complications were discussed with the                            patient. Return to normal activities tomorrow.                            Written discharge instructions were provided to the                            patient.                           - Patient has a contact number available for                            emergencies. The signs and symptoms of potential                            delayed complications were discussed with the                            patient. Return to normal activities tomorrow.                            Written  discharge instructions were provided to the                            patient.                           - Resume previous diet today.                           - Discontinue Omeprazole.                           - Continue present medications.                           - Use Protonix (pantoprazole) 40 mg PO daily today.                           - Use Pepcid (famotidine) 20 mg PO daily at bedtime.                           - Return to GI clinic in 3 months. Procedure Code(s):        --- Professional ---                           [phone], Esophagogastroduodenoscopy, flexible,                            transoral; with transendoscopic balloon dilation of                            esophagus (less than 30 mm diameter)                           99152, Moderate sedation services provided by the                            same physician or other qualified health care                            professional performing the  diagnostic or                            therapeutic service that the sedation supports,                            requiring the presence of an independent trained                            observer to assist in the monitoring of the                            patient's level of consciousness and physiological                            status; initial  15 minutes of intraservice time,                            patient age 85 years or older Diagnosis Code(s):        --- Professional ---                           K21.0, Gastro-esophageal reflux disease with                            esophagitis                           K22.2, Esophageal obstruction                           R13.14, Dysphagia, pharyngoesophageal phase CPT copyright 2016 [employer]. All rights reserved. The codes documented in this report are preliminary and upon coder review may  be revised to meet current compliance requirements. Hildred Laser, MD Hildred Laser, MD 03/24/2017 12:50:59 PM This report has been signed electronically. Number of Addenda: 0

## 2017-03-26 ENCOUNTER — Telehealth (INDEPENDENT_AMBULATORY_CARE_PROVIDER_SITE_OTHER): Payer: Self-pay | Admitting: Internal Medicine

## 2017-03-26 DIAGNOSIS — H40033 Anatomical narrow angle, bilateral: Secondary | ICD-10-CM | POA: Diagnosis not present

## 2017-03-26 DIAGNOSIS — H04123 Dry eye syndrome of bilateral lacrimal glands: Secondary | ICD-10-CM | POA: Diagnosis not present

## 2017-03-26 NOTE — Telephone Encounter (Signed)
Per Dr.Rehman the patient may take Tylenol as directed and use Cepacol Throat Lozenges If he develops a fever he needs to reach out to his PCP

## 2017-03-26 NOTE — Telephone Encounter (Signed)
Patient called, stated that he had a procedure done on 03/24/17 and has had severe sore throat.  [phone]

## 2017-03-28 DIAGNOSIS — J069 Acute upper respiratory infection, unspecified: Secondary | ICD-10-CM | POA: Diagnosis not present

## 2017-03-29 ENCOUNTER — Encounter (HOSPITAL_COMMUNITY): Payer: Self-pay | Admitting: Internal Medicine

## 2017-04-20 ENCOUNTER — Emergency Department (HOSPITAL_COMMUNITY): Payer: BLUE CROSS/BLUE SHIELD

## 2017-04-20 ENCOUNTER — Encounter (HOSPITAL_COMMUNITY): Payer: Self-pay | Admitting: Emergency Medicine

## 2017-04-20 ENCOUNTER — Emergency Department (HOSPITAL_COMMUNITY)
Admission: EM | Admit: 2017-04-20 | Discharge: 2017-04-21 | Disposition: A | Discharge status: Home / Self Care | Payer: BLUE CROSS/BLUE SHIELD | Attending: Internal Medicine | Admitting: Internal Medicine

## 2017-04-20 ENCOUNTER — Other Ambulatory Visit: Payer: Self-pay

## 2017-04-20 DIAGNOSIS — R1031 Right lower quadrant pain: Secondary | ICD-10-CM | POA: Diagnosis not present

## 2017-04-20 DIAGNOSIS — Z79899 Other long term (current) drug therapy: Secondary | ICD-10-CM | POA: Insufficient documentation

## 2017-04-20 DIAGNOSIS — R110 Nausea: Secondary | ICD-10-CM | POA: Diagnosis not present

## 2017-04-20 DIAGNOSIS — N433 Hydrocele, unspecified: Secondary | ICD-10-CM | POA: Diagnosis not present

## 2017-04-20 DIAGNOSIS — R112 Nausea with vomiting, unspecified: Secondary | ICD-10-CM | POA: Diagnosis not present

## 2017-04-20 DIAGNOSIS — J45909 Unspecified asthma, uncomplicated: Secondary | ICD-10-CM | POA: Insufficient documentation

## 2017-04-20 DIAGNOSIS — N50819 Testicular pain, unspecified: Secondary | ICD-10-CM

## 2017-04-20 DIAGNOSIS — R197 Diarrhea, unspecified: Secondary | ICD-10-CM | POA: Diagnosis not present

## 2017-04-20 LAB — URINALYSIS, ROUTINE W REFLEX MICROSCOPIC
Bilirubin Urine: NEGATIVE
Glucose, UA: NEGATIVE mg/dL
Hgb urine dipstick: NEGATIVE
Ketones, ur: 5 mg/dL — AB
Leukocytes, UA: NEGATIVE
Nitrite: NEGATIVE
Protein, ur: NEGATIVE mg/dL
Specific Gravity, Urine: 1.009 (ref 1.005–1.030)
pH: 7 (ref 5.0–8.0)

## 2017-04-20 LAB — COMPREHENSIVE METABOLIC PANEL
ALT: 44 U/L (ref 17–63)
AST: 34 U/L (ref 15–41)
Albumin: 4.8 g/dL (ref 3.5–5.0)
Alkaline Phosphatase: 73 U/L (ref 38–126)
Anion gap: 14 (ref 5–15)
BUN: 16 mg/dL (ref 6–20)
CO2: 22 mmol/L (ref 22–32)
Calcium: 9.7 mg/dL (ref 8.9–10.3)
Chloride: 106 mmol/L (ref 101–111)
Creatinine, Ser: 1.27 mg/dL — ABNORMAL HIGH (ref 0.61–1.24)
GFR calc Af Amer: >60 mL/min (ref >60)
GFR calc non Af Amer: >60 mL/min (ref >60)
Glucose, Bld: 108 mg/dL — ABNORMAL HIGH (ref 65–99)
Potassium: 3.4 mmol/L — ABNORMAL LOW (ref 3.5–5.1)
Sodium: 142 mmol/L (ref 135–145)
Total Bilirubin: 1 mg/dL (ref 0.3–1.2)
Total Protein: 8.2 g/dL — ABNORMAL HIGH (ref 6.5–8.1)

## 2017-04-20 LAB — CBC
HCT: 48.1 % (ref 39.0–52.0)
Hemoglobin: 16.7 g/dL (ref 13.0–17.0)
MCH: 29.3 pg (ref 26.0–34.0)
MCHC: 34.7 g/dL (ref 30.0–36.0)
MCV: 84.4 fL (ref 78.0–100.0)
Platelets: 248 10*3/uL (ref 150–400)
RBC: 5.7 MIL/uL (ref 4.22–5.81)
RDW: 12.4 % (ref 11.5–15.5)
WBC: 8.4 10*3/uL (ref 4.0–10.5)

## 2017-04-20 LAB — LIPASE, BLOOD: Lipase: 25 U/L (ref 11–51)

## 2017-04-20 MED ORDER — PROMETHAZINE HCL 25 MG/ML IJ SOLN
12.5000 mg | Freq: Once | INTRAMUSCULAR | Status: AC
Start: 2017-04-20 — End: 2017-04-20
  Administered 2017-04-20: 12.5 mg via INTRAVENOUS
  Filled 2017-04-20: qty 1

## 2017-04-20 MED ORDER — ONDANSETRON HCL 4 MG/2ML IJ SOLN
4.0000 mg | Freq: Once | INTRAMUSCULAR | Status: AC
  Administered 2017-04-20: 4 mg via INTRAVENOUS
  Filled 2017-04-20: qty 2

## 2017-04-20 MED ORDER — IOPAMIDOL (ISOVUE-300) INJECTION 61%
100.0000 mL | Freq: Once | INTRAVENOUS | Status: AC | PRN
  Administered 2017-04-20: 100 mL via INTRAVENOUS

## 2017-04-20 MED ORDER — PROMETHAZINE HCL 25 MG/ML IJ SOLN
12.5000 mg | Freq: Once | INTRAMUSCULAR | Status: AC
  Administered 2017-04-20: 12.5 mg via INTRAVENOUS
  Filled 2017-04-20: qty 1

## 2017-04-20 MED ORDER — HALOPERIDOL LACTATE 5 MG/ML IJ SOLN
2.5000 mg | Freq: Once | INTRAMUSCULAR | Status: AC
  Administered 2017-04-20: 2.5 mg via INTRAVENOUS
  Filled 2017-04-20: qty 1

## 2017-04-20 MED ORDER — ONDANSETRON HCL 4 MG PO TABS: 4.0000 mg | ORAL_TABLET | Freq: Every day | ORAL | 1 refills | Status: DC | PRN

## 2017-04-20 MED ORDER — SODIUM CHLORIDE 0.9 % IV BOLUS (SEPSIS)
1000.0000 mL | Freq: Once | INTRAVENOUS | Status: AC
  Administered 2017-04-20: 1000 mL via INTRAVENOUS

## 2017-04-20 MED ORDER — ONDANSETRON HCL 4 MG/2ML IJ SOLN
INTRAMUSCULAR | Status: AC
  Administered 2017-04-20: 4 mg
  Filled 2017-04-20: qty 2

## 2017-04-20 MED ORDER — MORPHINE SULFATE (PF) 4 MG/ML IV SOLN
8.0000 mg | Freq: Once | INTRAVENOUS | Status: AC
  Administered 2017-04-20: 8 mg via INTRAVENOUS
  Filled 2017-04-20: qty 2

## 2017-04-20 MED ORDER — HYDROMORPHONE HCL 1 MG/ML IJ SOLN
1.0000 mg | Freq: Once | INTRAMUSCULAR | Status: AC
  Administered 2017-04-20: 1 mg via INTRAVENOUS
  Filled 2017-04-20: qty 1

## 2017-04-20 MED ORDER — TRAMADOL HCL 50 MG PO TABS: 50.0000 mg | ORAL_TABLET | Freq: Four times a day (QID) | ORAL | 0 refills | Status: DC | PRN

## 2017-04-20 MED ORDER — ONDANSETRON HCL 4 MG/2ML IJ SOLN
4.0000 mg | Freq: Once | INTRAMUSCULAR | Status: AC | PRN
  Administered 2017-04-20: 4 mg via INTRAVENOUS
  Filled 2017-04-20: qty 2

## 2017-04-20 MED ORDER — KETOROLAC TROMETHAMINE 30 MG/ML IJ SOLN
15.0000 mg | Freq: Once | INTRAMUSCULAR | Status: AC
  Administered 2017-04-20: 15 mg via INTRAVENOUS
  Filled 2017-04-20: qty 1

## 2017-04-20 NOTE — Consult Note (Addendum)
[facility] H&P   Patient Demographics:    Zachary Nash, is a 47 y.o. male  MRN: [id number]   DOB - 05-Nov-1970  Admit Date - 04/20/2017  Outpatient Primary MD for the patient is Celene Squibb, MD  Referring MD/NP/PA: Daleen Bo  Outpatient Specialists:  Fanny Skates (surgery)  Patient coming from: home  Chief Complaint  Patient presents with  . Abdominal Pain      HPI:    Zachary Nash  is a 47 y.o. male, w c/o right inguinal discomfort starting today associate with some nausea.  Pt denies fever, chills, cp, palp, cough, sob, emesis, diarrhea, brbpr, black stool.  We are consulted for right inguinal pain.    In Ed.  CT abd/ pelvis IMPRESSION: 1. No acute findings within the abdomen or pelvis. 2. Small hiatal hernia. 3. Previous cholecystectomy.  Testicular ultrasound IMPRESSION: Negative testicular ultrasound. No evidence for torsion or other acute abnormality.  Wbc 8.4, Hgb 16.7, Plt 248 Bun 16, Creatinine 1.27 Ast 34, Alt 44 K 3.4  Pt was tx with zofran iv, phenergan iv, dilaudid iv in the ED with relief.  He was hydrated appropriately with ivf.  Pt feels improved.  I discussed with patient that the most likely source of pain in scar tissue from prior surgeries.      Review of systems:    In addition to the HPI above, No Fever-chills, No Headache, No changes with Vision or hearing, No problems swallowing food or Liquids, No Chest pain, Cough or Shortness of Breath,  Bowel movements are regular, No Blood in stool or Urine, No dysuria, No new skin rashes or bruises, No new joints pains-aches,  No new weakness, tingling, numbness in any extremity, No recent weight gain or loss, No polyuria, polydypsia or polyphagia, No significant Mental Stressors.  A full 10 point Review of Systems was done, except as stated above, all other Review of Systems were  negative.   With Past History of the following :    Past Medical History:  Diagnosis Date  . Anxiety   . Asthma   . Bilateral inguinal hernia 05/29/2016  . Complication of anesthesia    states he stopped breathing one time with anesthesia  . Constipation   . Fatty liver   . GERD (gastroesophageal reflux disease)   . History of kidney stones   . Pneumonia   . PONV (postoperative nausea and vomiting)   . Radicular pain of left lower extremity       Past Surgical History:  Procedure Laterality Date  . CHOLECYSTECTOMY    . ESOPHAGEAL DILATION N/A 03/24/2017   Procedure: ESOPHAGEAL DILATION;  Surgeon: Rogene Houston, MD;  Location: [facility];  Service: Endoscopy;  Laterality: N/A;  . ESOPHAGOGASTRODUODENOSCOPY N/A 03/24/2017   Procedure: ESOPHAGOGASTRODUODENOSCOPY (EGD);  Surgeon: Rogene Houston, MD;  Location: [facility];  Service: Endoscopy;  Laterality:  N/A;  1155  . INGUINAL HERNIA REPAIR Bilateral 05/29/2016   Procedure: OPEN HERNIA REPAIR INGUINAL ADULT BILATERAL WITH insertion of MESH;  Surgeon: Fanny Skates, MD;  Location: [facility];  Service: General;  Laterality: Bilateral;  . Lipoma removed        Social History:     Social History   Tobacco Use  . Smoking status: Never Smoker  . Smokeless tobacco: Never Used  Substance Use Topics  . Alcohol use: No     Lives - at home  Mobility - walks by self   Family History :     Family History  Problem Relation Age of Onset  . Heart failure Father   . Heart attack Father   . Ovarian cancer Mother   . Arthritis Mother   . Anxiety disorder Mother       Home Medications:   Prior to Admission medications   Medication Sig Start Date End Date Taking? Authorizing Provider  albuterol (PROVENTIL HFA;VENTOLIN HFA) 108 (90 BASE) MCG/ACT inhaler Inhale 2 puffs into the lungs every 6 (six) hours as needed for wheezing.   Yes [provider]  famotidine (PEPCID) 20 MG tablet Take 1 tablet (20 mg total)  by mouth at bedtime. 03/24/17  Yes Rehman, Mechele Dawley, MD  Multiple Vitamins-Minerals (ALIVE MENS ENERGY PO) Take 1 tablet by mouth daily.   Yes [provider]  pantoprazole (PROTONIX) 40 MG tablet Take 1 tablet (40 mg total) by mouth daily before breakfast. 03/24/17  Yes Rehman, Mechele Dawley, MD     Allergies:     Allergies  Allergen Reactions  . Penicillins Anaphylaxis and Swelling    Has patient had a PCN reaction causing immediate rash, facial/tongue/throat swelling, SOB or lightheadedness with hypotension: yes Has patient had a PCN reaction causing severe rash involving mucus membranes or skin necrosis: yes Has patient had a PCN reaction that required hospitalization: no Has patient had a PCN reaction occurring within the last 10 years: no If all of the above answers are "NO", then may proceed with Cephalosporin use.     Physical Exam:   Vitals  Blood pressure 111/82, pulse 79, temperature 97.7 F (36.5 C), temperature source Oral, resp. rate 16, height 5' 11"  (1.803 m), weight 88.5 kg (195 lb), SpO2 99 %.   1. General  lying in bed in NAD,    2. Normal affect and insight, Not Suicidal or Homicidal, Awake Alert, Oriented X 3.  3. No F.N deficits, ALL C.Nerves Intact, Strength 5/5 all 4 extremities, Sensation intact all 4 extremities, Plantars down going.  4. Ears and Eyes appear Normal, Conjunctivae clear, PERRLA. Moist Oral Mucosa.  5. Supple Neck, No JVD, No cervical lymphadenopathy appriciated, No Carotid Bruits.  6. Symmetrical Chest wall movement, Good air movement bilaterally, CTAB.  7. RRR, No Gallops, Rubs or Murmurs, No Parasternal Heave.  8. Positive Bowel Sounds, Abdomen Soft, No tenderness, No organomegaly appriciated,No rebound -guarding or rigidity.  Pt is not currently tender in the right inguinal area , no evidence of testicular torsion.   9.  No Cyanosis, Normal Skin Turgor, No Skin Rash or Bruise.  10. Good muscle tone,  joints appear normal , no  effusions, Normal ROM.  11. No Palpable Lymph Nodes in Neck or Axillae     Data Review:    CBC Recent Labs  Lab 04/20/17 1524  WBC 8.4  HGB 16.7  HCT 48.1  PLT 248  MCV 84.4  MCH 29.3  MCHC 34.7  RDW 12.4   ------------------------------------------------------------------------------------------------------------------  Chemistries  Recent Labs  Lab 04/20/17 1524  NA 142  K 3.4*  CL 106  CO2 22  GLUCOSE 108*  BUN 16  CREATININE 1.27*  CALCIUM 9.7  AST 34  ALT 44  ALKPHOS 73  BILITOT 1.0   ------------------------------------------------------------------------------------------------------------------ estimated creatinine clearance is 77.4 mL/min (A) (by C-G formula based on SCr of 1.27 mg/dL (H)). ------------------------------------------------------------------------------------------------------------------ No results for input(s): TSH, T4TOTAL, T3FREE, THYROIDAB in the last 72 hours.  Invalid input(s): FREET3  Coagulation profile No results for input(s): INR, PROTIME in the last 168 hours. ------------------------------------------------------------------------------------------------------------------- No results for input(s): DDIMER in the last 72 hours. -------------------------------------------------------------------------------------------------------------------  Cardiac Enzymes No results for input(s): CKMB, TROPONINI, MYOGLOBIN in the last 168 hours.  Invalid input(s): CK ------------------------------------------------------------------------------------------------------------------ No results found for: BNP   ---------------------------------------------------------------------------------------------------------------  Urinalysis    Component Value Date/Time   COLORURINE YELLOW 04/20/2017 [place] 04/20/2017 1610   LABSPEC 1.009 04/20/2017 1610   PHURINE 7.0 04/20/2017 1610   GLUCOSEU NEGATIVE 04/20/2017 1610     HGBUR NEGATIVE 04/20/2017 1610   BILIRUBINUR NEGATIVE 04/20/2017 1610   KETONESUR 5 (A) 04/20/2017 1610   PROTEINUR NEGATIVE 04/20/2017 1610   UROBILINOGEN 0.2 12/10/2014 0210   NITRITE NEGATIVE 04/20/2017 1610   LEUKOCYTESUR NEGATIVE 04/20/2017 1610    ----------------------------------------------------------------------------------------------------------------   Imaging Results:    US Scrotum  Result Date: 04/20/2017 CLINICAL DATA:  Initial evaluation for acute bilateral testicular pain. EXAM: SCROTAL ULTRASOUND DOPPLER ULTRASOUND OF THE TESTICLES TECHNIQUE: Complete ultrasound examination of the testicles, epididymis, and other scrotal structures was performed. Color and spectral Doppler ultrasound were also utilized to evaluate blood flow to the testicles. COMPARISON:  None. FINDINGS: Right testicle Measurements: 4.6 x 2.2 x 3.2 cm. No mass or microlithiasis visualized. Left testicle Measurements: 4.3 x 1.8 x 3.1 cm. No mass or microlithiasis visualized. Right epididymis: Normal in size and appearance. Tiny incidental cyst measuring 2.7 x 2.1 x 2.9 mm noted, of doubtful significance. Left epididymis:  Normal in size and appearance. Hydrocele:  Small bilateral hydroceles. Varicocele:  None visualized. Pulsed Doppler interrogation of both testes demonstrates normal low resistance arterial and venous waveforms bilaterally. IMPRESSION: Negative testicular ultrasound. No evidence for torsion or other acute abnormality. Electronically Signed   By: Jeannine Boga M.D.   On: 04/20/2017 22:25   Ct Abdomen Pelvis W Contrast  Result Date: 04/20/2017 CLINICAL DATA:  Nausea and diarrhea. EXAM: CT ABDOMEN AND PELVIS WITH CONTRAST TECHNIQUE: Multidetector CT imaging of the abdomen and pelvis was performed using the standard protocol following bolus administration of intravenous contrast. CONTRAST:  119m ISOVUE-300 IOPAMIDOL (ISOVUE-300) INJECTION 61% COMPARISON:  12/10/2014 FINDINGS: Lower chest:  No acute abnormality. Hepatobiliary: No focal liver abnormality is seen. Status post cholecystectomy. No biliary dilatation. Pancreas: Unremarkable. No pancreatic ductal dilatation or surrounding inflammatory changes. Spleen: Normal in size without focal abnormality. Adrenals/Urinary Tract: The adrenal glands are normal. The kidneys are both unremarkable. No mass or hydronephrosis. Urinary bladder appears normal. Stomach/Bowel: Small hiatal hernia. No abnormal dilatation of the small bowel loops. The appendix is visualized and appears normal. Unremarkable appearance of the terminal ileum. Normal appearance of the colon. Vascular/Lymphatic: Normal appearance of the abdominal aorta. No enlarged retroperitoneal or mesenteric adenopathy. No enlarged pelvic or inguinal lymph nodes. Reproductive: Prostate is unremarkable. Other: No abdominal wall hernia or abnormality. No abdominopelvic ascites. Musculoskeletal: No acute or significant osseous findings. IMPRESSION: 1. No acute findings within the abdomen or pelvis. 2. Small hiatal hernia. 3. Previous  cholecystectomy. Electronically Signed   By: Kerby Moors M.D.   On: 04/20/2017 17:36   US Pelvic Doppler (torsion R/o Or Mass Arterial Flow)  Result Date: 04/20/2017 CLINICAL DATA:  Initial evaluation for acute bilateral testicular pain. EXAM: SCROTAL ULTRASOUND DOPPLER ULTRASOUND OF THE TESTICLES TECHNIQUE: Complete ultrasound examination of the testicles, epididymis, and other scrotal structures was performed. Color and spectral Doppler ultrasound were also utilized to evaluate blood flow to the testicles. COMPARISON:  None. FINDINGS: Right testicle Measurements: 4.6 x 2.2 x 3.2 cm. No mass or microlithiasis visualized. Left testicle Measurements: 4.3 x 1.8 x 3.1 cm. No mass or microlithiasis visualized. Right epididymis: Normal in size and appearance. Tiny incidental cyst measuring 2.7 x 2.1 x 2.9 mm noted, of doubtful significance. Left epididymis:  Normal in size  and appearance. Hydrocele:  Small bilateral hydroceles. Varicocele:  None visualized. Pulsed Doppler interrogation of both testes demonstrates normal low resistance arterial and venous waveforms bilaterally. IMPRESSION: Negative testicular ultrasound. No evidence for torsion or other acute abnormality. Electronically Signed   By: Jeannine Boga M.D.   On: 04/20/2017 22:25      Assessment & Plan:    Active Problems:   Right inguinal pain    R inguinal pain likely related to scar tissue Asked patient to please contact Dr. Audie Pinto office to make an appointment for follow up Tramadol 79m po q6h prn #16  Nausea Zofran 431m1 po qid prn #16    Discussed above plan with patient who feels improved was in agreement the above plan Please return to ED if not continuing to improve  Time spent in minutes : 45    JaJani Gravel.D on 04/20/2017 at 11:40 PM  Between 7am to 7pm - Pager - [phone] After 7pm go to [email or web address] - password [email or web address] - Office  [phone]

## 2017-04-20 NOTE — ED Triage Notes (Signed)
Patient sent by Dr. Nevada Crane for possible appendicitis.

## 2017-04-20 NOTE — ED Provider Notes (Signed)
[facility] EMERGENCY DEPARTMENT Provider Note   CSN: [id number] Arrival date & time: 04/20/17  1504     History   Chief Complaint Chief Complaint  Patient presents with  . Abdominal Pain    HPI Zachary Nash is a 47 y.o. male.  HPI   47 year old male with intermittent nausea and vomiting.  Symptom onset yesterday.  Persistent and progressive since then.  He was seen in his PCPs office earlier today.  They noted some tenderness right lower quadrant & referred him to the emergency room for further evaluation. He says he didn't even necessarily notice pain there until he was pressed on.  No fevers or chills.  No urinary complaints.  Reports prior cholecystectomy and inguinal hernia repair.  Past Medical History:  Diagnosis Date  . Anxiety   . Asthma   . Bilateral inguinal hernia 05/29/2016  . Complication of anesthesia    states he stopped breathing one time with anesthesia  . Constipation   . Fatty liver   . GERD (gastroesophageal reflux disease)   . History of kidney stones   . Pneumonia   . PONV (postoperative nausea and vomiting)   . Radicular pain of left lower extremity     Patient Active Problem List   Diagnosis Date Noted  . Dysphagia 03/24/2017  . Bilateral inguinal hernia 05/29/2016  . 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 Hyperglycemia 05/18/2013  . Radicular pain of left lower extremity   . Dizziness 05/16/2013    Past Surgical History:  Procedure Laterality Date  . CHOLECYSTECTOMY    . ESOPHAGEAL DILATION N/A 03/24/2017   Procedure: ESOPHAGEAL DILATION;  Surgeon: Rogene Houston, MD;  Location: [facility];  Service: Endoscopy;  Laterality: N/A;  . ESOPHAGOGASTRODUODENOSCOPY N/A 03/24/2017   Procedure: ESOPHAGOGASTRODUODENOSCOPY (EGD);  Surgeon: Rogene Houston, MD;  Location: [facility];  Service: Endoscopy;  Laterality: N/A;  1155  . INGUINAL HERNIA REPAIR Bilateral 05/29/2016   Procedure: OPEN HERNIA REPAIR INGUINAL ADULT BILATERAL WITH  insertion of MESH;  Surgeon: Fanny Skates, MD;  Location: [facility];  Service: General;  Laterality: Bilateral;  . Lipoma removed         Home Medications    Prior to Admission medications   Medication Sig Start Date End Date Taking? Authorizing Provider  albuterol (PROVENTIL HFA;VENTOLIN HFA) 108 (90 BASE) MCG/ACT inhaler Inhale 2 puffs into the lungs every 6 (six) hours as needed for wheezing.    [provider]  famotidine (PEPCID) 20 MG tablet Take 1 tablet (20 mg total) by mouth at bedtime. 03/24/17   Rogene Houston, MD  Multiple Vitamins-Minerals (ALIVE MENS ENERGY PO) Take 1 tablet by mouth daily.    [provider]  pantoprazole (PROTONIX) 40 MG tablet Take 1 tablet (40 mg total) by mouth daily before breakfast. 03/24/17   Rehman, Mechele Dawley, MD    Family History Family History  Problem Relation Age of Onset  . Heart failure Father   . Heart attack Father   . Ovarian cancer Mother   . Arthritis Mother   . Anxiety disorder Mother     Social History Social History   Tobacco Use  . Smoking status: Never Smoker  . Smokeless tobacco: Never Used  Substance Use Topics  . Alcohol use: No  . Drug use: No     Allergies   Penicillins   Review of Systems Review of Systems  All systems reviewed and negative, other than as noted in HPI.  Physical Exam Updated  Vital Signs BP (!) 132/99 (BP Location: Right Arm)   Pulse 82   Temp 97.7 F (36.5 C) (Oral)   Resp 18   Ht 5' 11"  (1.803 m)   Wt 88.5 kg (195 lb)   SpO2 100%   BMI 27.20 kg/m   Physical Exam  Constitutional: He appears well-developed and well-nourished. No distress.  HENT:  Head: Normocephalic and atraumatic.  Eyes: Conjunctivae are normal. Right eye exhibits no discharge. Left eye exhibits no discharge.  Neck: Neck supple.  Cardiovascular: Normal rate, regular rhythm and normal heart sounds. Exam reveals no gallop and no friction rub.  No murmur heard. Pulmonary/Chest: Effort  normal and breath sounds normal. No respiratory distress.  Abdominal: Soft. He exhibits no distension. There is tenderness.  Right lower quadrant tenderness without rebound or guarding.  No distention. Tenderness along b/l inguinal regions. No adenopathy. No overlying skin changes. Some mild tenderness? R testicle. Normal lie. No hernia appreciated.   Musculoskeletal: He exhibits no edema or tenderness.  Neurological: He is alert.  Skin: Skin is warm and dry.  Psychiatric: He has a normal mood and affect. His behavior is normal. Thought content normal.  Nursing note and vitals reviewed.    ED Treatments / Results  Labs (all labs ordered are listed, but only abnormal results are displayed) Labs Reviewed  COMPREHENSIVE METABOLIC PANEL - Abnormal; Notable for the following components:      Result Value   Potassium 3.4 (*)    Glucose, Bld 108 (*)    Creatinine, Ser 1.27 (*)    Total Protein 8.2 (*)    All other components within normal limits  URINALYSIS, ROUTINE W REFLEX MICROSCOPIC - Abnormal; Notable for the following components:   Ketones, ur 5 (*)    All other components within normal limits  LIPASE, BLOOD  CBC    EKG  EKG Interpretation None       Radiology US Scrotum  Result Date: 04/20/2017 CLINICAL DATA:  Initial evaluation for acute bilateral testicular pain. EXAM: SCROTAL ULTRASOUND DOPPLER ULTRASOUND OF THE TESTICLES TECHNIQUE: Complete ultrasound examination of the testicles, epididymis, and other scrotal structures was performed. Color and spectral Doppler ultrasound were also utilized to evaluate blood flow to the testicles. COMPARISON:  None. FINDINGS: Right testicle Measurements: 4.6 x 2.2 x 3.2 cm. No mass or microlithiasis visualized. Left testicle Measurements: 4.3 x 1.8 x 3.1 cm. No mass or microlithiasis visualized. Right epididymis: Normal in size and appearance. Tiny incidental cyst measuring 2.7 x 2.1 x 2.9 mm noted, of doubtful significance. Left  epididymis:  Normal in size and appearance. Hydrocele:  Small bilateral hydroceles. Varicocele:  None visualized. Pulsed Doppler interrogation of both testes demonstrates normal low resistance arterial and venous waveforms bilaterally. IMPRESSION: Negative testicular ultrasound. No evidence for torsion or other acute abnormality. Electronically Signed   By: Jeannine Boga M.D.   On: 04/20/2017 22:25   Ct Abdomen Pelvis W Contrast  Result Date: 04/20/2017 CLINICAL DATA:  Nausea and diarrhea. EXAM: CT ABDOMEN AND PELVIS WITH CONTRAST TECHNIQUE: Multidetector CT imaging of the abdomen and pelvis was performed using the standard protocol following bolus administration of intravenous contrast. CONTRAST:  171m ISOVUE-300 IOPAMIDOL (ISOVUE-300) INJECTION 61% COMPARISON:  12/10/2014 FINDINGS: Lower chest: No acute abnormality. Hepatobiliary: No focal liver abnormality is seen. Status post cholecystectomy. No biliary dilatation. Pancreas: Unremarkable. No pancreatic ductal dilatation or surrounding inflammatory changes. Spleen: Normal in size without focal abnormality. Adrenals/Urinary Tract: The adrenal glands are normal. The kidneys are both unremarkable. No mass  or hydronephrosis. Urinary bladder appears normal. Stomach/Bowel: Small hiatal hernia. No abnormal dilatation of the small bowel loops. The appendix is visualized and appears normal. Unremarkable appearance of the terminal ileum. Normal appearance of the colon. Vascular/Lymphatic: Normal appearance of the abdominal aorta. No enlarged retroperitoneal or mesenteric adenopathy. No enlarged pelvic or inguinal lymph nodes. Reproductive: Prostate is unremarkable. Other: No abdominal wall hernia or abnormality. No abdominopelvic ascites. Musculoskeletal: No acute or significant osseous findings. IMPRESSION: 1. No acute findings within the abdomen or pelvis. 2. Small hiatal hernia. 3. Previous cholecystectomy. Electronically Signed   By: Kerby Moors M.D.    On: 04/20/2017 17:36   US Pelvic Doppler (torsion R/o Or Mass Arterial Flow)  Result Date: 04/20/2017 CLINICAL DATA:  Initial evaluation for acute bilateral testicular pain. EXAM: SCROTAL ULTRASOUND DOPPLER ULTRASOUND OF THE TESTICLES TECHNIQUE: Complete ultrasound examination of the testicles, epididymis, and other scrotal structures was performed. Color and spectral Doppler ultrasound were also utilized to evaluate blood flow to the testicles. COMPARISON:  None. FINDINGS: Right testicle Measurements: 4.6 x 2.2 x 3.2 cm. No mass or microlithiasis visualized. Left testicle Measurements: 4.3 x 1.8 x 3.1 cm. No mass or microlithiasis visualized. Right epididymis: Normal in size and appearance. Tiny incidental cyst measuring 2.7 x 2.1 x 2.9 mm noted, of doubtful significance. Left epididymis:  Normal in size and appearance. Hydrocele:  Small bilateral hydroceles. Varicocele:  None visualized. Pulsed Doppler interrogation of both testes demonstrates normal low resistance arterial and venous waveforms bilaterally. IMPRESSION: Negative testicular ultrasound. No evidence for torsion or other acute abnormality. Electronically Signed   By: Jeannine Boga M.D.   On: 04/20/2017 22:25    Procedures Procedures (including critical care time)  Medications Ordered in ED Medications  sodium chloride 0.9 % bolus 1,000 mL (not administered)  ondansetron (ZOFRAN) injection 4 mg (not administered)  ketorolac (TORADOL) 30 MG/ML injection 15 mg (not administered)  HYDROmorphone (DILAUDID) injection 1 mg (not administered)  ondansetron (ZOFRAN) injection 4 mg (4 mg Intravenous Given 04/20/17 1532)     Initial Impression / Assessment and Plan / ED Course  I have reviewed the triage vital signs and the nursing notes.  Pertinent labs & imaging results that were available during my care of the patient were reviewed by me and considered in my medical decision making (see chart for details).    47 year old male  with nausea and vomiting and lower abdominal pain.  He has tenderness right lower quadrant with bilateral inguinal regions and into the scrotum.  No concerning skin changes.  I cannot appreciate a hernia.  CT without explanatory pathology.  Urinalysis is normal.  Labs are unrevealing as well.   Patient has a history of bilateral inguinal hernia repair in March of last year.  On review of records, it sounds like he presented with fairly similar symptoms as today about 3 months after this. That workup that was unremarkable as well. It sounds like his symptoms improved and he was discharged. He reports he hasn't had similar symptoms since then.    Final Clinical Impressions(s) / ED Diagnoses   Final diagnoses:  Right lower quadrant abdominal pain    ED Discharge Orders    None       Virgel Manifold, MD 04/21/17 2107

## 2017-04-20 NOTE — ED Notes (Signed)
Verbal order for Zofran was given by Dr. Wilson Singer

## 2017-04-20 NOTE — ED Notes (Signed)
Have notified MD of pt.'s pain.

## 2017-04-20 NOTE — ED Notes (Signed)
CT notified of need to have [place] come in for pt procedure

## 2017-04-20 NOTE — ED Notes (Signed)
Pt to CT

## 2017-04-20 NOTE — ED Provider Notes (Signed)
23: 00 p.m.-patient states he is waiting to be admitted.  States he still has pain.  He states he cannot manage himself at home, like this because of nausea and abdominal pain.  The pain is in the right lower quadrant.  Patient was offered medications for nausea and pain to take home, but stated he want to stay here where someone could "help me."   Results for orders placed or performed during the hospital encounter of 04/20/17  Lipase, blood  Result Value Ref Range   Lipase 25 11 - 51 U/L  Comprehensive metabolic panel  Result Value Ref Range   Sodium 142 135 - 145 mmol/L   Potassium 3.4 (L) 3.5 - 5.1 mmol/L   Chloride 106 101 - 111 mmol/L   CO2 22 22 - 32 mmol/L   Glucose, Bld 108 (H) 65 - 99 mg/dL   BUN 16 6 - 20 mg/dL   Creatinine, Ser 1.27 (H) 0.61 - 1.24 mg/dL   Calcium 9.7 8.9 - 10.3 mg/dL   Total Protein 8.2 (H) 6.5 - 8.1 g/dL   Albumin 4.8 3.5 - 5.0 g/dL   AST 34 15 - 41 U/L   ALT 44 17 - 63 U/L   Alkaline Phosphatase 73 38 - 126 U/L   Total Bilirubin 1.0 0.3 - 1.2 mg/dL   GFR calc non Af Amer >60 >60 mL/min   GFR calc Af Amer >60 >60 mL/min   Anion gap 14 5 - 15  CBC  Result Value Ref Range   WBC 8.4 4.0 - 10.5 K/uL   RBC 5.70 4.22 - 5.81 MIL/uL   Hemoglobin 16.7 13.0 - 17.0 g/dL   HCT 48.1 39.0 - 52.0 %   MCV 84.4 78.0 - 100.0 fL   MCH 29.3 26.0 - 34.0 pg   MCHC 34.7 30.0 - 36.0 g/dL   RDW 12.4 11.5 - 15.5 %   Platelets 248 150 - 400 K/uL  Urinalysis, Routine w reflex microscopic  Result Value Ref Range   Color, Urine YELLOW YELLOW   APPearance CLEAR CLEAR   Specific Gravity, Urine 1.009 1.005 - 1.030   pH 7.0 5.0 - 8.0   Glucose, UA NEGATIVE NEGATIVE mg/dL   Hgb urine dipstick NEGATIVE NEGATIVE   Bilirubin Urine NEGATIVE NEGATIVE   Ketones, ur 5 (A) NEGATIVE mg/dL   Protein, ur NEGATIVE NEGATIVE mg/dL   Nitrite NEGATIVE NEGATIVE   Leukocytes, UA NEGATIVE NEGATIVE     Patient Vitals for the past 24 hrs:  BP Temp Temp src Pulse Resp SpO2 Height Weight   04/20/17 2231 107/76 - - 67 16 94 % - -  04/20/17 2033 113/74 - - 73 15 97 % - -  04/20/17 1800 124/77 - - 82 14 100 % - -  04/20/17 1730 132/85 - - 87 15 100 % - -  04/20/17 1700 127/83 - - 79 14 100 % - -  04/20/17 1643 118/83 - - 72 18 98 % - -  04/20/17 1612 124/84 - - 93 17 100 % - -  04/20/17 1509 (!) 132/99 97.7 F (36.5 C) Oral 82 18 100 % 5' 11"  (1.803 m) 88.5 kg (195 lb)     11:01 PM-Consult complete with hospitalist. Patient case explained and discussed.  The agrees to evaluate patient for further assessment and treatment, as needed. Call ended at 11:10 PM   Daleen Bo, MD 04/20/17 2326

## 2017-04-20 NOTE — Discharge Instructions (Signed)
Abdominal Pain, Adult Abdominal pain can be caused by many things. Often, abdominal pain is not serious and it gets better with no treatment or by being treated at home. However, sometimes abdominal pain is serious. Your health care provider will do a medical history and a physical exam to try to determine the cause of your abdominal pain. Follow these instructions at home:  Take over-the-counter and prescription medicines only as told by your health care provider. Do not take a laxative unless told by your health care provider.  Drink enough fluid to keep your urine clear or pale yellow.  Watch your condition for any changes.  Keep all follow-up visits as told by your health care provider. This is important. Contact a health care provider if:  Your abdominal pain changes or gets worse.  You are not hungry or you lose weight without trying.  You are constipated or have diarrhea for more than 2-3 days.  You have pain when you urinate or have a bowel movement.  Your abdominal pain wakes you up at night.  Your pain gets worse with meals, after eating, or with certain foods.  You are throwing up and cannot keep anything down.  You have a fever. Get help right away if:  Your pain does not go away as soon as your health care provider told you to expect.  You cannot stop throwing up.  Your pain is only in areas of the abdomen, such as the right side or the left lower portion of the abdomen.  You have bloody or black stools, or stools that look like tar.  You have severe pain, cramping, or bloating in your abdomen.  You have signs of dehydration, such as: ? Dark urine, very little urine, or no urine. ? Cracked lips. ? Dry mouth. ? Sunken eyes. ? Sleepiness. ? Weakness. This information is not intended to replace advice given to you by your health care provider. Make sure you discuss any questions you have with your health care provider. Document Released: 12/24/2004 Document  Revised: 10/04/2015 Document Reviewed: 08/28/2015 Elsevier Interactive Patient Education  [employer].

## 2017-04-29 DIAGNOSIS — S59902A Unspecified injury of left elbow, initial encounter: Secondary | ICD-10-CM | POA: Diagnosis not present

## 2017-04-29 DIAGNOSIS — M25522 Pain in left elbow: Secondary | ICD-10-CM | POA: Diagnosis not present

## 2017-04-29 DIAGNOSIS — S59902D Unspecified injury of left elbow, subsequent encounter: Secondary | ICD-10-CM | POA: Diagnosis not present

## 2017-04-29 DIAGNOSIS — Z6827 Body mass index (BMI) 27.0-27.9, adult: Secondary | ICD-10-CM | POA: Diagnosis not present

## 2017-05-03 DIAGNOSIS — H43391 Other vitreous opacities, right eye: Secondary | ICD-10-CM | POA: Diagnosis not present

## 2017-05-08 DIAGNOSIS — M25512 Pain in left shoulder: Secondary | ICD-10-CM | POA: Diagnosis not present

## 2017-05-11 ENCOUNTER — Other Ambulatory Visit (HOSPITAL_COMMUNITY): Payer: Self-pay | Admitting: Preventative Medicine

## 2017-05-11 DIAGNOSIS — M5412 Radiculopathy, cervical region: Secondary | ICD-10-CM

## 2017-05-17 ENCOUNTER — Ambulatory Visit (HOSPITAL_COMMUNITY)
Admission: RE | Admit: 2017-05-17 | Discharge: 2017-05-17 | Disposition: A | Discharge status: Home / Self Care | Payer: Worker's Compensation | Source: Ambulatory Visit | Attending: Preventative Medicine | Admitting: Preventative Medicine

## 2017-05-17 DIAGNOSIS — M5412 Radiculopathy, cervical region: Secondary | ICD-10-CM | POA: Insufficient documentation

## 2017-05-17 DIAGNOSIS — M2578 Osteophyte, vertebrae: Secondary | ICD-10-CM | POA: Insufficient documentation

## 2017-05-17 DIAGNOSIS — M4803 Spinal stenosis, cervicothoracic region: Secondary | ICD-10-CM | POA: Diagnosis not present

## 2017-05-17 DIAGNOSIS — M50223 Other cervical disc displacement at C6-C7 level: Secondary | ICD-10-CM | POA: Diagnosis not present

## 2017-05-24 ENCOUNTER — Encounter (HOSPITAL_COMMUNITY): Payer: Self-pay | Admitting: *Deleted

## 2017-05-24 ENCOUNTER — Emergency Department (HOSPITAL_COMMUNITY): Payer: BLUE CROSS/BLUE SHIELD

## 2017-05-24 ENCOUNTER — Other Ambulatory Visit: Payer: Self-pay

## 2017-05-24 ENCOUNTER — Inpatient Hospital Stay (HOSPITAL_COMMUNITY)
Admission: EM | Admit: 2017-05-24 | Discharge: 2017-05-29 | DRG: 472 | Disposition: A | Discharge status: Home Health | Payer: BLUE CROSS/BLUE SHIELD | Attending: Internal Medicine | Admitting: Internal Medicine

## 2017-05-24 DIAGNOSIS — F419 Anxiety disorder, unspecified: Secondary | ICD-10-CM | POA: Diagnosis present

## 2017-05-24 DIAGNOSIS — J45909 Unspecified asthma, uncomplicated: Secondary | ICD-10-CM | POA: Diagnosis present

## 2017-05-24 DIAGNOSIS — K4020 Bilateral inguinal hernia, without obstruction or gangrene, not specified as recurrent: Secondary | ICD-10-CM | POA: Diagnosis not present

## 2017-05-24 DIAGNOSIS — R200 Anesthesia of skin: Secondary | ICD-10-CM | POA: Diagnosis not present

## 2017-05-24 DIAGNOSIS — M4802 Spinal stenosis, cervical region: Secondary | ICD-10-CM | POA: Diagnosis not present

## 2017-05-24 DIAGNOSIS — S199XXA Unspecified injury of neck, initial encounter: Secondary | ICD-10-CM | POA: Diagnosis not present

## 2017-05-24 DIAGNOSIS — M5412 Radiculopathy, cervical region: Secondary | ICD-10-CM | POA: Diagnosis not present

## 2017-05-24 DIAGNOSIS — G959 Disease of spinal cord, unspecified: Secondary | ICD-10-CM | POA: Diagnosis not present

## 2017-05-24 DIAGNOSIS — Z9049 Acquired absence of other specified parts of digestive tract: Secondary | ICD-10-CM

## 2017-05-24 DIAGNOSIS — R52 Pain, unspecified: Secondary | ICD-10-CM | POA: Diagnosis not present

## 2017-05-24 DIAGNOSIS — Z419 Encounter for procedure for purposes other than remedying health state, unspecified: Secondary | ICD-10-CM | POA: Diagnosis not present

## 2017-05-24 DIAGNOSIS — M79604 Pain in right leg: Secondary | ICD-10-CM | POA: Diagnosis not present

## 2017-05-24 DIAGNOSIS — M79605 Pain in left leg: Secondary | ICD-10-CM | POA: Diagnosis not present

## 2017-05-24 DIAGNOSIS — G9589 Other specified diseases of spinal cord: Secondary | ICD-10-CM | POA: Diagnosis not present

## 2017-05-24 DIAGNOSIS — M4322 Fusion of spine, cervical region: Secondary | ICD-10-CM | POA: Diagnosis not present

## 2017-05-24 DIAGNOSIS — G549 Nerve root and plexus disorder, unspecified: Secondary | ICD-10-CM | POA: Diagnosis not present

## 2017-05-24 DIAGNOSIS — K219 Gastro-esophageal reflux disease without esophagitis: Secondary | ICD-10-CM | POA: Diagnosis present

## 2017-05-24 DIAGNOSIS — G9520 Unspecified cord compression: Secondary | ICD-10-CM

## 2017-05-24 DIAGNOSIS — M5410 Radiculopathy, site unspecified: Secondary | ICD-10-CM | POA: Diagnosis not present

## 2017-05-24 DIAGNOSIS — G9529 Other cord compression: Secondary | ICD-10-CM | POA: Diagnosis not present

## 2017-05-24 DIAGNOSIS — Z79899 Other long term (current) drug therapy: Secondary | ICD-10-CM | POA: Diagnosis not present

## 2017-05-24 DIAGNOSIS — R29898 Other symptoms and signs involving the musculoskeletal system: Secondary | ICD-10-CM | POA: Diagnosis not present

## 2017-05-24 DIAGNOSIS — R531 Weakness: Secondary | ICD-10-CM | POA: Diagnosis not present

## 2017-05-24 DIAGNOSIS — R1310 Dysphagia, unspecified: Secondary | ICD-10-CM | POA: Diagnosis not present

## 2017-05-24 DIAGNOSIS — R55 Syncope and collapse: Secondary | ICD-10-CM | POA: Diagnosis not present

## 2017-05-24 DIAGNOSIS — M47812 Spondylosis without myelopathy or radiculopathy, cervical region: Secondary | ICD-10-CM | POA: Diagnosis not present

## 2017-05-24 DIAGNOSIS — M5489 Other dorsalgia: Secondary | ICD-10-CM | POA: Diagnosis not present

## 2017-05-24 DIAGNOSIS — S0990XA Unspecified injury of head, initial encounter: Secondary | ICD-10-CM | POA: Diagnosis not present

## 2017-05-24 DIAGNOSIS — R739 Hyperglycemia, unspecified: Secondary | ICD-10-CM | POA: Diagnosis not present

## 2017-05-24 LAB — I-STAT CHEM 8, ED
BUN: 20 mg/dL (ref 6–20)
CREATININE: 1.3 mg/dL — AB (ref 0.61–1.24)
Calcium, Ion: 1.08 mmol/L — ABNORMAL LOW (ref 1.15–1.40)
Chloride: 108 mmol/L (ref 101–111)
GLUCOSE: 98 mg/dL (ref 65–99)
HEMATOCRIT: 47 % (ref 39.0–52.0)
HEMOGLOBIN: 16 g/dL (ref 13.0–17.0)
Potassium: 3 mmol/L — ABNORMAL LOW (ref 3.5–5.1)
Sodium: 142 mmol/L (ref 135–145)
TCO2: 18 mmol/L — AB (ref 22–32)

## 2017-05-24 LAB — COMPREHENSIVE METABOLIC PANEL
ALK PHOS: 65 U/L (ref 38–126)
ALT: 39 U/L (ref 17–63)
AST: 33 U/L (ref 15–41)
Albumin: 4.1 g/dL (ref 3.5–5.0)
Anion gap: 11 (ref 5–15)
BUN: 19 mg/dL (ref 6–20)
CALCIUM: 9.2 mg/dL (ref 8.9–10.3)
CO2: 19 mmol/L — AB (ref 22–32)
CREATININE: 1.36 mg/dL — AB (ref 0.61–1.24)
Chloride: 109 mmol/L (ref 101–111)
Glucose, Bld: 105 mg/dL — ABNORMAL HIGH (ref 65–99)
Potassium: 3.1 mmol/L — ABNORMAL LOW (ref 3.5–5.1)
SODIUM: 139 mmol/L (ref 135–145)
Total Bilirubin: 0.6 mg/dL (ref 0.3–1.2)
Total Protein: 6.9 g/dL (ref 6.5–8.1)

## 2017-05-24 LAB — CBC WITH DIFFERENTIAL/PLATELET
Basophils Absolute: 0 10*3/uL (ref 0.0–0.1)
Basophils Relative: 0 %
Eosinophils Absolute: 0.2 10*3/uL (ref 0.0–0.7)
Eosinophils Relative: 3 %
HEMATOCRIT: 44.7 % (ref 39.0–52.0)
HEMOGLOBIN: 16.4 g/dL (ref 13.0–17.0)
LYMPHS ABS: 2.2 10*3/uL (ref 0.7–4.0)
LYMPHS PCT: 32 %
MCH: 30.4 pg (ref 26.0–34.0)
MCHC: 36.7 g/dL — ABNORMAL HIGH (ref 30.0–36.0)
MCV: 82.9 fL (ref 78.0–100.0)
Monocytes Absolute: 0.6 10*3/uL (ref 0.1–1.0)
Monocytes Relative: 8 %
NEUTROS ABS: 3.9 10*3/uL (ref 1.7–7.7)
Neutrophils Relative %: 57 %
Platelets: 218 10*3/uL (ref 150–400)
RBC: 5.39 MIL/uL (ref 4.22–5.81)
RDW: 12.9 % (ref 11.5–15.5)
WBC: 6.9 10*3/uL (ref 4.0–10.5)

## 2017-05-24 MED ORDER — HYDROMORPHONE HCL 1 MG/ML IJ SOLN
1.0000 mg | Freq: Once | INTRAMUSCULAR | Status: AC
  Administered 2017-05-24: 1 mg via INTRAVENOUS
  Filled 2017-05-24: qty 1

## 2017-05-24 MED ORDER — ONDANSETRON HCL 4 MG/2ML IJ SOLN
INTRAMUSCULAR | Status: AC
  Filled 2017-05-24: qty 2

## 2017-05-24 MED ORDER — IOPAMIDOL (ISOVUE-370) INJECTION 76%
INTRAVENOUS | Status: AC
  Administered 2017-05-24: 50 mL
  Filled 2017-05-24: qty 50

## 2017-05-24 MED ORDER — POTASSIUM CHLORIDE 10 MEQ/100ML IV SOLN: 10.0000 meq | Freq: Once | INTRAVENOUS | Status: DC

## 2017-05-24 MED ORDER — DEXAMETHASONE SODIUM PHOSPHATE 10 MG/ML IJ SOLN
10.0000 mg | Freq: Once | INTRAMUSCULAR | Status: AC
  Administered 2017-05-24: 10 mg via INTRAVENOUS
  Filled 2017-05-24: qty 1

## 2017-05-24 MED ORDER — ONDANSETRON HCL 4 MG/2ML IJ SOLN
4.0000 mg | Freq: Once | INTRAMUSCULAR | Status: AC
  Administered 2017-05-24: 4 mg via INTRAVENOUS

## 2017-05-24 MED ORDER — ONDANSETRON HCL 4 MG/2ML IJ SOLN
4.0000 mg | Freq: Once | INTRAMUSCULAR | Status: DC
  Administered 2017-05-24: 4 mg via INTRAVENOUS

## 2017-05-24 MED ORDER — DEXAMETHASONE SODIUM PHOSPHATE 10 MG/ML IJ SOLN: 10.0000 mg | Freq: Once | INTRAMUSCULAR | Status: DC

## 2017-05-24 MED ORDER — ONDANSETRON HCL 4 MG/2ML IJ SOLN
INTRAMUSCULAR | Status: AC
  Administered 2017-05-24: 4 mg via INTRAVENOUS
  Filled 2017-05-24: qty 2

## 2017-05-24 MED ORDER — PROMETHAZINE HCL 25 MG/ML IJ SOLN
25.0000 mg | Freq: Once | INTRAMUSCULAR | Status: AC
  Administered 2017-05-24: 25 mg via INTRAVENOUS
  Filled 2017-05-24: qty 1

## 2017-05-24 MED ORDER — LORAZEPAM 2 MG/ML IJ SOLN: 1.0000 mg | Freq: Once | INTRAMUSCULAR | Status: DC

## 2017-05-24 NOTE — ED Notes (Signed)
Return from CT.  Dr. Oneida Alar and Dr. Grandville Silos in to assess pt.  Pt remains alert and oriented.  Family remains at bedside

## 2017-05-24 NOTE — ED Notes (Signed)
Dr. Grandville Silos on call for Dr. Pat Patrick to [phone]-per Dr. Adair Patter by Levada Dy

## 2017-05-24 NOTE — ED Triage Notes (Signed)
Pt here from home via [facility] for possible complications related to cervical spine injury on Jan 27th.  Pt injured his neck on Jan 27th but was only seen at [facility] 2 weeks later.  Was sent to [facility] who performed MRI and dx pt with cervical myelopathy.  Pt has been off work since then, taking flexeril, robaxin and ultram.  Today pt was sitting on toilet and went to stand and acutely felt like he couldn't walk.  He sat down in a recliner and reached across body with L arm and felt tingling and pain sensations to neck.  Now barely able to move limbs and has decreased sensation to entire body.

## 2017-05-24 NOTE — ED Notes (Signed)
Pt returned from MRI.  Pt c/o being nauseated, color pale, diaphoretic.  Dr. Sabra Heck made aware and into room to assess pt

## 2017-05-24 NOTE — Consult Note (Addendum)
ORTHOPAEDIC CONSULTATION HISTORY & PHYSICAL REQUESTING PHYSICIAN: Noemi Chapel, MD  Chief Complaint: B LE weakness  HPI: Zachary Nash is an 47 y.o. male presenting to the ED today from home.  He reports initial injury on 04-25-17, when he was pulling backwards on a type of a release for a large truck at work.  MRI scan was performed on 05-17-17, revealing canal and neuroforaminal stenosis, and he was evaluated by Dr. Lynann Bologna on 05-21-17, who recommended two-level ACDF.  This was a [employer] case, and authorization has been requested.   He reports having been off work since then, taking flexeril, robaxin and ultram for pain. Today he was sitting on the toilet and went to stand then  abruptly was unable to stand.  Heremained in the recliner and reached across body with his left arm and felt tingling and pain sensations to his neck.  Per Triage nurse, he was barely able to move limbs and had decreased sensation to his entire body. EDP felt that this might be due to a worsened myelopathy, therefore an MRI of the cervical and lumbar spine, with fairly stable findings on cervical MRI when compared to 05-17-17, with the exception of a new right vertebral artery dissection.  Being on-call for [employer], I was asked to evaluate him prior to MRI scan being completed.  Past Medical History:  Diagnosis Date  . Anxiety   . Asthma   . Bilateral inguinal hernia 05/29/2016  . Complication of anesthesia    states he stopped breathing one time with anesthesia  . Constipation   . Fatty liver   . GERD (gastroesophageal reflux disease)   . History of kidney stones   . Pneumonia   . PONV (postoperative nausea and vomiting)   . Radicular pain of left lower extremity    Past Surgical History:  Procedure Laterality Date  . CHOLECYSTECTOMY    . ESOPHAGEAL DILATION N/A 03/24/2017   Procedure: ESOPHAGEAL DILATION;  Surgeon: Rogene Houston, MD;  Location: [facility];  Service:  Endoscopy;  Laterality: N/A;  . ESOPHAGOGASTRODUODENOSCOPY N/A 03/24/2017   Procedure: ESOPHAGOGASTRODUODENOSCOPY (EGD);  Surgeon: Rogene Houston, MD;  Location: [facility];  Service: Endoscopy;  Laterality: N/A;  1155  . INGUINAL HERNIA REPAIR Bilateral 05/29/2016   Procedure: OPEN HERNIA REPAIR INGUINAL ADULT BILATERAL WITH insertion of MESH;  Surgeon: Fanny Skates, MD;  Location: [facility];  Service: General;  Laterality: Bilateral;  . Lipoma removed     Social History   Socioeconomic History  . Marital status: Legally Separated    Spouse name: None  . Number of children: None  . Years of education: None  . Highest education level: None  Social Needs  . Financial resource strain: None  . Food insecurity - worry: None  . Food insecurity - inability: None  . Transportation needs - medical: None  . Transportation needs - non-medical: None  Occupational History  . None  Tobacco Use  . Smoking status: Never Smoker  . Smokeless tobacco: Never Used  Substance and Sexual Activity  . Alcohol use: No  . Drug use: No  . Sexual activity: None  Other Topics Concern  . None  Social History Narrative  . None   Family History  Problem Relation Age of Onset  . Heart failure Father   . Heart attack Father   . Ovarian cancer Mother   . Arthritis Mother   . Anxiety disorder Mother    Allergies  Allergen Reactions  .  Penicillins Anaphylaxis and Swelling    Has patient had a PCN reaction causing immediate rash, facial/tongue/throat swelling, SOB or lightheadedness with hypotension: yes Has patient had a PCN reaction causing severe rash involving mucus membranes or skin necrosis: yes Has patient had a PCN reaction that required hospitalization: no Has patient had a PCN reaction occurring within the last 10 years: no If all of the above answers are "NO", then may proceed with Cephalosporin use.   Prior to Admission medications   Medication Sig Start Date End Date Taking? Authorizing  Provider  albuterol (PROVENTIL HFA;VENTOLIN HFA) 108 (90 BASE) MCG/ACT inhaler Inhale 2 puffs into the lungs every 6 (six) hours as needed for wheezing.   Yes [provider]  cyclobenzaprine (FLEXERIL) 10 MG tablet Take 10 mg by mouth at bedtime.   Yes [provider]  famotidine (PEPCID) 20 MG tablet Take 1 tablet (20 mg total) by mouth at bedtime. 03/24/17  Yes Rehman, Mechele Dawley, MD  methocarbamol (ROBAXIN) 750 MG tablet Take 750 mg by mouth every morning.   Yes [provider]  Multiple Vitamins-Minerals (ALIVE MENS ENERGY PO) Take 1 tablet by mouth daily.   Yes [provider]  pantoprazole (PROTONIX) 40 MG tablet Take 1 tablet (40 mg total) by mouth daily before breakfast. 03/24/17  Yes Rehman, Mechele Dawley, MD  traMADol (ULTRAM) 50 MG tablet Take 1 tablet (50 mg total) by mouth every 6 (six) hours as needed. 04/20/17 04/20/18  Jani Gravel, MD   Mr Cervical Spine Wo Contrast  Result Date: 05/24/2017 CLINICAL DATA:  Cervical spine injury January 27. EXAM: MRI CERVICAL AND LUMBAR SPINE WITHOUT CONTRAST TECHNIQUE: Multiplanar and multiecho pulse sequences of the cervical spine, to include the craniocervical junction and cervicothoracic junction, and lumbar spine, were obtained without intravenous contrast. COMPARISON:  Cervical spine MRI 05/17/2017 FINDINGS: MRI CERVICAL SPINE FINDINGS Alignment: Physiologic. Vertebrae: Hyperintense T2-weighted signal focus at the C6 vertebral body with low T1-weighted signal, probably a fat poor hemangioma. No acute fracture. No discitis-osteomyelitis. Cord: Normal Posterior Fossa, vertebral arteries, paraspinal tissues: There is an acute dissection of the right vertebral artery. The artery is poorly visualized below the C4-5 level, but there is clearly abnormal T2-weighted signal surrounding and above this level, which is new compared to 05/17/2017. Disc levels: C1-C2: Normal. C2-C3: Normal disc space and facets. No spinal canal or  neuroforaminal stenosis. C3-C4: Small disc osteophyte complex with mild bilateral foraminal narrowing. C4-C5: Small disc osteophyte complex with mild right and moderate left neural foraminal stenosis. C5-C6: Bilobed disc protrusion effaces the thecal sac and deforms the spinal cord, unchanged. No signal change within the spinal cord. There is severe spinal canal stenosis with mild right and severe left neural foraminal stenosis. C6-C7: Small disc osteophyte complex with mild spinal canal stenosis and mild right, moderate left foraminal stenosis. C7-T1: Normal disc space and facets. No spinal canal or neuroforaminal stenosis. MRI LUMBAR SPINE FINDINGS Segmentation:  Standard. Alignment:  Physiologic. Vertebrae:  No fracture, evidence of discitis, or bone lesion. Conus medullaris and cauda equina: Conus extends to the L2 level. Conus and cauda equina appear normal. Paraspinal and other soft tissues: The visualized vascular, retroperitoneal and paraspinal structures are normal. Disc levels: The T11-L4 levels are normal. L4-L5: Moderate facet hypertrophy with small central disc protrusion. No stenosis. L5-S1: Normal. IMPRESSION: 1. 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 The right vertebral artery origin and proximal V2 segment are incompletely visualized on this study.  Brain MRI/MRA without contrast should be considered to assess for intracranial acute ischemia. CTA of the neck could provide further detail of the right vertebral artery, particularly its proximal portion, but this is not necessary for confirmation of the dissection. 2. 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 Severe left neural foraminal stenosis also at this level. 3. No acute abnormality or significant stenosis of the lumbar spine. These results were called by telephone at the time of interpretation on 05/24/2017 at 7:58 pm to Dr. Noemi Chapel ,  who verbally acknowledged these results. Electronically Signed   By: Ulyses Jarred M.D.   On: 05/24/2017 20:05   Mr Lumbar Spine Wo Contrast  Result Date: 05/24/2017 CLINICAL DATA:  Cervical spine injury January 27. EXAM: MRI CERVICAL AND LUMBAR SPINE WITHOUT CONTRAST TECHNIQUE: Multiplanar and multiecho pulse sequences of the cervical spine, to include the craniocervical junction and cervicothoracic junction, and lumbar spine, were obtained without intravenous contrast. COMPARISON:  Cervical spine MRI 05/17/2017 FINDINGS: MRI CERVICAL SPINE FINDINGS Alignment: Physiologic. Vertebrae: Hyperintense T2-weighted signal focus at the C6 vertebral body with low T1-weighted signal, probably a fat poor hemangioma. No acute fracture. No discitis-osteomyelitis. Cord: Normal Posterior Fossa, vertebral arteries, paraspinal tissues: There is an acute dissection of the right vertebral artery. The artery is poorly visualized below the C4-5 level, but there is clearly abnormal T2-weighted signal surrounding and above this level, which is new compared to 05/17/2017. Disc levels: C1-C2: Normal. C2-C3: Normal disc space and facets. No spinal canal or neuroforaminal stenosis. C3-C4: Small disc osteophyte complex with mild bilateral foraminal narrowing. C4-C5: Small disc osteophyte complex with mild right and moderate left neural foraminal stenosis. C5-C6: Bilobed disc protrusion effaces the thecal sac and deforms the spinal cord, unchanged. No signal change within the spinal cord. There is severe spinal canal stenosis with mild right and severe left neural foraminal stenosis. C6-C7: Small disc osteophyte complex with mild spinal canal stenosis and mild right, moderate left foraminal stenosis. C7-T1: Normal disc space and facets. No spinal canal or neuroforaminal stenosis. MRI LUMBAR SPINE FINDINGS Segmentation:  Standard. Alignment:  Physiologic. Vertebrae:  No fracture, evidence of discitis, or bone lesion. Conus medullaris and  cauda equina: Conus extends to the L2 level. Conus and cauda equina appear normal. Paraspinal and other soft tissues: The visualized vascular, retroperitoneal and paraspinal structures are normal. Disc levels: The T11-L4 levels are normal. L4-L5: Moderate facet hypertrophy with small central disc protrusion. No stenosis. L5-S1: Normal. IMPRESSION: 1. 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 The right vertebral artery origin and proximal V2 segment are incompletely visualized on this study. Brain MRI/MRA without contrast should be considered to assess for intracranial acute ischemia. CTA of the neck could provide further detail of the right vertebral artery, particularly its proximal portion, but this is not necessary for confirmation of the dissection. 2. 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 Severe left neural foraminal stenosis also at this level. 3. No acute abnormality or significant stenosis of the lumbar spine. These results were called by telephone at the time of interpretation on 05/24/2017 at 7:58 pm to Dr. Noemi Chapel , who verbally acknowledged these results. Electronically Signed   By: Ulyses Jarred M.D.   On: 05/24/2017 20:05    Positive ROS: All other systems have been reviewed and were otherwise negative with the exception of those mentioned in the HPI  and as above.  Physical Exam: Vitals: Refer to EMR. Constitutional:  WD, WN, NAD HEENT:  NCAT, EOMI Neuro/Psych:  Alert & oriented to person, place, and time; appropriate mood & affect Lymphatic: No generalized extremity edema or lymphadenopathy Extremities / MSK:  The extremities are normal with respect to appearance, ranges of motion, joint stability, muscle strength/tone, sensation, & perfusion except as otherwise noted:  He is presently in a cervical collar.  Both radial and DP pulses are palpable.  He reports increased neck pain  with both active and passive movement of both shoulders.  He can generate reasonably good strength with resisted elbow flexion, elbow extension, wrist extension, wrist flexion, digital flexion and extension bilaterally.  Intact light touch sensibility throughout all dermatomes of the upper and lower extremities and trunk, including inner upper thigh.  He reports diminished but detectable sensibility on the left side when compared to the right with regard to the lower extremity more so than the upper.  Positive Hoffmann bilaterally. He reports increased neck pain with even passive movement of the lower extremities, but when I lift each leg under the knee, he keeps the heels from touching the bed through quadriceps contraction.  Further testing of quadriceps strength is breakaway.  He will not allow me to position the hips in a way to test knee flexion strength or hip flexion strength.  He generates reasonably good strength to manual testing when asked to extend the great toes, the ankle, or ankle dorsiflexion.  Toes are downgoing with Babinski.  Assessment: 1. 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  These findings appear not significantly changed from MRI scan 05-17-17, associated with cervical myelopathy and radiculopathy 2.  Possible right vertebral artery dissection or possible CNS lesion  Plan: I discussed these findings with him.  He is undergoing additional testing, including CT angiography and MRI scan of the brain for further evaluation.  In addition, Dr. Sabra Heck, EDP, has requested consultations from neurology and vascular surgery.  Although it was initially planned for him to undergo a 2-level ACDF as an outpatient, Dr. Lynann Bologna will remain involved with development of a integrated plan of care for this patient.  At present, we await completion imaging/evaluations by other disciplines.  If all involved ultimately  conclude that this unusual presentation represents onlyl acute symptomatic worsening of cervical  myelopathy, then may possibly proceed with more urgent cervical surgery during this hospitalization.  Rayvon Char [address], [place], [place]  [address] Office: [phone] Mobile: [phone]  05/24/2017, 8:56 PM

## 2017-05-24 NOTE — ED Notes (Signed)
Report given to Ian Bushman, RN

## 2017-05-24 NOTE — ED Notes (Signed)
To CT at this time.

## 2017-05-24 NOTE — ED Notes (Signed)
Pt in MRI at this time 

## 2017-05-24 NOTE — ED Provider Notes (Addendum)
[facility] PROGRESSIVE CARE Provider Note   CSN: [id number] Arrival date & time: 05/24/17  1714     History   Chief Complaint Chief Complaint  Patient presents with  . Numbness    HPI DONEVAN BILLER is a 47 y.o. male.  HPI   The patient is a 47 year old male, he does have a history of anxiety as well as asthma, he has had a history of radiculopathy in the past, approximately 2 weeks ago the patient developed some pain and numbness in his left arm after working, he was trying to detach 1/5 wheel pin, he works as a Forensic scientist.  He states that he had acute onset of pain going from the shoulder up into his neck and since that time is had an ongoing neck pain.  He had an MRI ordered which showed asymmetrical disc protrusion at C5-6 was compressing the spinal cord asymmetrically to the left, likely affecting the C6 nerve.  The patient reports that he has had ongoing discomfort since that time however today after using the bathroom he went to sit down in his chair at home and felt acute onset of bilateral severe pain in the arms and the legs associated with numbness of the majority of the left side of his body going down his left chest onto his left abdomen as well.  There is no numbness on the face.  He has severe pain with moving any 4 extremities but more on the left than the right.  This is severe in quantity, burning in quality and the location is left more than right.  Past Medical History:  Diagnosis Date  . Anxiety   . Asthma   . Bilateral inguinal hernia 05/29/2016  . Complication of anesthesia    states he stopped breathing one time with anesthesia  . Constipation   . Fatty liver   . GERD (gastroesophageal reflux disease)   . History of kidney stones   . Pneumonia   . PONV (postoperative nausea and vomiting)   . Radicular pain of left lower extremity     Patient Active Problem List   Diagnosis Date Noted  . Generalized weakness 05/25/2017  . Left  sided numbness 05/24/2017  . Right inguinal pain 04/20/2017  . Dysphagia 03/24/2017  . Bilateral inguinal hernia 05/29/2016  . 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 Hyperglycemia 05/18/2013  . Radicular pain of left lower extremity   . Dizziness 05/16/2013    Past Surgical History:  Procedure Laterality Date  . CHOLECYSTECTOMY    . ESOPHAGEAL DILATION N/A 03/24/2017   Procedure: ESOPHAGEAL DILATION;  Surgeon: Rogene Houston, MD;  Location: [facility];  Service: Endoscopy;  Laterality: N/A;  . ESOPHAGOGASTRODUODENOSCOPY N/A 03/24/2017   Procedure: ESOPHAGOGASTRODUODENOSCOPY (EGD);  Surgeon: Rogene Houston, MD;  Location: [facility];  Service: Endoscopy;  Laterality: N/A;  1155  . INGUINAL HERNIA REPAIR Bilateral 05/29/2016   Procedure: OPEN HERNIA REPAIR INGUINAL ADULT BILATERAL WITH insertion of MESH;  Surgeon: Fanny Skates, MD;  Location: [facility];  Service: General;  Laterality: Bilateral;  . Lipoma removed         Home Medications    Prior to Admission medications   Medication Sig Start Date End Date Taking? Authorizing Provider  albuterol (PROVENTIL HFA;VENTOLIN HFA) 108 (90 BASE) MCG/ACT inhaler Inhale 2 puffs into the lungs every 6 (six) hours as needed for wheezing.   Yes [provider]  cyclobenzaprine (FLEXERIL) 10 MG tablet Take 10 mg by mouth  at bedtime.   Yes [provider]  famotidine (PEPCID) 20 MG tablet Take 1 tablet (20 mg total) by mouth at bedtime. 03/24/17  Yes Rehman, Mechele Dawley, MD  methocarbamol (ROBAXIN) 750 MG tablet Take 750 mg by mouth every morning.   Yes [provider]  Multiple Vitamins-Minerals (ALIVE MENS ENERGY PO) Take 1 tablet by mouth daily.   Yes [provider]  pantoprazole (PROTONIX) 40 MG tablet Take 1 tablet (40 mg total) by mouth daily before breakfast. 03/24/17  Yes Rehman, Mechele Dawley, MD  traMADol (ULTRAM) 50 MG tablet Take 1 tablet (50 mg total) by mouth every 6 (six) hours as needed. 04/20/17  04/20/18  Jani Gravel, MD    Family History Family History  Problem Relation Age of Onset  . Heart failure Father   . Heart attack Father   . Ovarian cancer Mother   . Arthritis Mother   . Anxiety disorder Mother     Social History Social History   Tobacco Use  . Smoking status: Never Smoker  . Smokeless tobacco: Never Used  Substance Use Topics  . Alcohol use: No  . Drug use: No     Allergies   Penicillins   Review of Systems Review of Systems  All other systems reviewed and are negative.    Physical Exam Updated Vital Signs BP 115/81   Pulse 83   Temp 97.8 F (36.6 C) (Oral)   Resp 16   Ht 5' 11"  (1.803 m)   Wt 88.5 kg (195 lb)   SpO2 93%   BMI 27.20 kg/m   Physical Exam  Constitutional: He appears well-developed and well-nourished. He appears distressed.  HENT:  Head: Normocephalic and atraumatic.  Mouth/Throat: Oropharynx is clear and moist. No oropharyngeal exudate.  Eyes: Conjunctivae and EOM are normal. Pupils are equal, round, and reactive to light. Right eye exhibits no discharge. Left eye exhibits no discharge. No scleral icterus.  Neck: Normal range of motion. Neck supple. No JVD present. No thyromegaly present.  Cardiovascular: Normal rate, regular rhythm, normal heart sounds and intact distal pulses. Exam reveals no gallop and no friction rub.  No murmur heard. Pulmonary/Chest: Effort normal and breath sounds normal. No respiratory distress. He has no wheezes. He has no rales.  Abdominal: Soft. Bowel sounds are normal. He exhibits no distension and no mass. There is no tenderness.  Genitourinary:  Genitourinary Comments: Normal-appearing penis scrotum and testicles, no swelling or asymmetry.  Musculoskeletal: Normal range of motion. He exhibits tenderness. He exhibits no edema.  There is diffuse tenderness to palpation over the left arm and leg, there is also pain with movement of the bilateral lower extremities.  He has pain with flexing and  extending at the hips, there is no pain in the upper extremity on the right with movement however the left upper extremity has pain with flexion or extension.  Lymphadenopathy:    He has no cervical adenopathy.  Neurological: He is alert. Coordination normal.  The patient has normal cranial nerves III through XII, normal speech, normal memory.  His strength is difficult to evaluate as he is unable to lift his left leg.  He is able to lift his left arm but has significant pain with doing so.  He has a grip bilaterally which seem equal however he has an asymmetrical sensory deficit including his trunk, his arms, his legs with the left side significantly decreased to both light touch and painful stimuli.  He has decreased sensation into the perineum  as well as onto the scrotum and the penis.  He is able to detect touch but it is significantly different than the right side comparing left to right.  The patient does have some weakness of the left lower extremity and trying to straight leg raise.  Skin: Skin is warm and dry. No rash noted. No erythema.  Psychiatric: He has a normal mood and affect. His behavior is normal.  Nursing note and vitals reviewed.    ED Treatments / Results  Labs (all labs ordered are listed, but only abnormal results are displayed) Labs Reviewed  CBC WITH DIFFERENTIAL/PLATELET - Abnormal; Notable for the following components:      Result Value   MCHC 36.7 (*)    All other components within normal limits  COMPREHENSIVE METABOLIC PANEL - Abnormal; Notable for the following components:   Potassium 3.1 (*)    CO2 19 (*)    Glucose, Bld 105 (*)    Creatinine, Ser 1.36 (*)    All other components within normal limits  BASIC METABOLIC PANEL - Abnormal; Notable for the following components:   CO2 17 (*)    Glucose, Bld 178 (*)    BUN 22 (*)    Calcium 8.6 (*)    All other components within normal limits  GLUCOSE, CAPILLARY - Abnormal; Notable for the following components:     Glucose-Capillary 147 (*)    All other components within normal limits  I-STAT CHEM 8, ED - Abnormal; Notable for the following components:   Potassium 3.0 (*)    Creatinine, Ser 1.30 (*)    Calcium, Ion 1.08 (*)    TCO2 18 (*)    All other components within normal limits  MRSA PCR SCREENING  CBC  HIV ANTIBODY (ROUTINE TESTING)  TYPE AND SCREEN  ABO/RH    EKG  EKG Interpretation  Date/Time:  Monday May 24 2017 17:23:01 EST Ventricular Rate:  98 PR Interval:    QRS Duration: 79 QT Interval:  347 QTC Calculation: 443 R Axis:   23 Text Interpretation:  Sinus rhythm Normal ECG Confirmed by Noemi Chapel [phone]) on 05/24/2017 5:33:25 PM       Radiology Ct Angio Head W Or Wo Contrast  Result Date: 05/24/2017 CLINICAL DATA:  Tingling and numbness in both lower extremities. Fall with neck injury in January. Suspected vertebral artery dissection seen on earlier MRI. EXAM: CT HEAD WITHOUT CONTRAST CT ANGIOGRAPHY OF THE NECK TECHNIQUE: Contiguous axial images were obtained from the base of the skull through the vertex without intravenous contrast. Multidetector CT imaging of the neck was performed using the standard protocol during bolus administration of intravenous contrast. Multiplanar CT image reconstructions and MIPs were obtained to evaluate the vascular anatomy. Carotid stenosis measurements (when applicable) are obtained utilizing NASCET criteria, using the distal internal carotid diameter as the denominator. CONTRAST:  50 mL Isovue 370 COMPARISON:  CT head neck 01/29/2016 FINDINGS: CTA NECK FINDINGS Aortic arch: There is no calcific atherosclerosis of the aortic arch. There is no aneurysm, dissection or hemodynamically significant stenosis of the visualized ascending aorta and aortic arch. Conventional 3 vessel aortic branching pattern. The visualized proximal subclavian arteries are widely patent. Right carotid system: The right common carotid origin is widely patent. There is  no common carotid or internal carotid artery dissection or aneurysm. No hemodynamically significant stenosis. Left carotid system: The left common carotid origin is widely patent. There is no common carotid or internal carotid artery dissection or aneurysm. No hemodynamically significant stenosis. Vertebral  arteries: The vertebral system is left dominant. Both vertebral artery origins are normal. Both vertebral arteries are normal to their confluence with the basilar artery. The focal narrowing seen on the T2-weighted images of the earlier MRI is not confirmed here. Retrospectively, the right vertebral artery flow void is normal on the traditional spin echo T2-weighted sequence from the MRI. It is only on the high-resolution CUBE sequence that it is abnormal. Skeleton: There is no bony spinal canal stenosis. No lytic or blastic lesions. Other neck: The nasopharynx is clear. The oropharynx and hypopharynx are normal. The epiglottis is normal. The supraglottic larynx, glottis and subglottic larynx are normal. No retropharyngeal collection. The parapharyngeal spaces are preserved. The parotid and submandibular glands are normal. No sialolithiasis or salivary ductal dilatation. The thyroid gland is normal. There is no cervical lymphadenopathy. Upper chest: No pneumothorax or pleural effusion. No nodules or masses. Review of the MIP images confirms the above findings CTA HEAD FINDINGS Anterior circulation: --Intracranial internal carotid arteries: Normal. --Anterior cerebral arteries: Normal. --Middle cerebral arteries: Normal. --Posterior communicating arteries: Absent bilaterally. Posterior circulation: --Posterior cerebral arteries: Normal. --Superior cerebellar arteries: Normal. --Basilar artery: Normal. --Anterior inferior cerebellar arteries: Normal. --Posterior inferior cerebellar arteries: Normal. Venous sinuses: As permitted by contrast timing, patent. Anatomic variants: None Delayed phase: No parenchymal  contrast enhancement. Review of the MIP images confirms the above findings. IMPRESSION: 1. Normal CTA of the head and neck. 2. The severe narrowing of the right vertebral artery flow void seen on the same day MRI is not confirmed on this study. Retrospectively, the right vertebral abnormality was present only on the T2-weighted CUBE sequence and not on the traditional T2-weighted sequence. Thus, it is probably artifactual. Given the combination of all these factors the right vertebral artery is considered normal without evidence of dissection. These results were called by telephone at the time of interpretation on 05/24/2017 at 9:05 pm to Dr. Noemi Chapel , who verbally acknowledged these results. Electronically Signed   By: Ulyses Jarred M.D.   On: 05/24/2017 21:05   Ct Angio Neck W And/or Wo Contrast  Result Date: 05/24/2017 CLINICAL DATA:  Tingling and numbness in both lower extremities. Fall with neck injury in January. Suspected vertebral artery dissection seen on earlier MRI. EXAM: CT HEAD WITHOUT CONTRAST CT ANGIOGRAPHY OF THE NECK TECHNIQUE: Contiguous axial images were obtained from the base of the skull through the vertex without intravenous contrast. Multidetector CT imaging of the neck was performed using the standard protocol during bolus administration of intravenous contrast. Multiplanar CT image reconstructions and MIPs were obtained to evaluate the vascular anatomy. Carotid stenosis measurements (when applicable) are obtained utilizing NASCET criteria, using the distal internal carotid diameter as the denominator. CONTRAST:  50 mL Isovue 370 COMPARISON:  CT head neck 01/29/2016 FINDINGS: CTA NECK FINDINGS Aortic arch: There is no calcific atherosclerosis of the aortic arch. There is no aneurysm, dissection or hemodynamically significant stenosis of the visualized ascending aorta and aortic arch. Conventional 3 vessel aortic branching pattern. The visualized proximal subclavian arteries are widely  patent. Right carotid system: The right common carotid origin is widely patent. There is no common carotid or internal carotid artery dissection or aneurysm. No hemodynamically significant stenosis. Left carotid system: The left common carotid origin is widely patent. There is no common carotid or internal carotid artery dissection or aneurysm. No hemodynamically significant stenosis. Vertebral arteries: The vertebral system is left dominant. Both vertebral artery origins are normal. Both vertebral arteries are normal to their confluence with  the basilar artery. The focal narrowing seen on the T2-weighted images of the earlier MRI is not confirmed here. Retrospectively, the right vertebral artery flow void is normal on the traditional spin echo T2-weighted sequence from the MRI. It is only on the high-resolution CUBE sequence that it is abnormal. Skeleton: There is no bony spinal canal stenosis. No lytic or blastic lesions. Other neck: The nasopharynx is clear. The oropharynx and hypopharynx are normal. The epiglottis is normal. The supraglottic larynx, glottis and subglottic larynx are normal. No retropharyngeal collection. The parapharyngeal spaces are preserved. The parotid and submandibular glands are normal. No sialolithiasis or salivary ductal dilatation. The thyroid gland is normal. There is no cervical lymphadenopathy. Upper chest: No pneumothorax or pleural effusion. No nodules or masses. Review of the MIP images confirms the above findings CTA HEAD FINDINGS Anterior circulation: --Intracranial internal carotid arteries: Normal. --Anterior cerebral arteries: Normal. --Middle cerebral arteries: Normal. --Posterior communicating arteries: Absent bilaterally. Posterior circulation: --Posterior cerebral arteries: Normal. --Superior cerebellar arteries: Normal. --Basilar artery: Normal. --Anterior inferior cerebellar arteries: Normal. --Posterior inferior cerebellar arteries: Normal. Venous sinuses: As permitted  by contrast timing, patent. Anatomic variants: None Delayed phase: No parenchymal contrast enhancement. Review of the MIP images confirms the above findings. IMPRESSION: 1. Normal CTA of the head and neck. 2. The severe narrowing of the right vertebral artery flow void seen on the same day MRI is not confirmed on this study. Retrospectively, the right vertebral abnormality was present only on the T2-weighted CUBE sequence and not on the traditional T2-weighted sequence. Thus, it is probably artifactual. Given the combination of all these factors the right vertebral artery is considered normal without evidence of dissection. These results were called by telephone at the time of interpretation on 05/24/2017 at 9:05 pm to Dr. Noemi Chapel , who verbally acknowledged these results. Electronically Signed   By: Ulyses Jarred M.D.   On: 05/24/2017 21:05   Mr Brain Wo Contrast  Result Date: 05/24/2017 CLINICAL DATA:  Acute neck pain with difficulty moving extremities. EXAM: MRI HEAD WITHOUT CONTRAST TECHNIQUE: Multiplanar, multiecho pulse sequences of the brain and surrounding structures were obtained without intravenous contrast. COMPARISON:  None. FINDINGS: Brain: Partially empty sella. There is no acute infarct or acute hemorrhage. No mass lesion, hydrocephalus, dural abnormality or extra-axial collection. The brain parenchymal signal is normal for the patient's age. No age-advanced or lobar predominant atrophy. No chronic microhemorrhage or superficial siderosis. Vascular: Major intracranial arterial and venous sinus flow voids are preserved. Skull and upper cervical spine: The visualized skull base, calvarium, upper cervical spine and extracranial soft tissues are normal. Sinuses/Orbits: No fluid levels or advanced mucosal thickening. No mastoid or middle ear effusion. Normal orbits. IMPRESSION: Normal brain MRI. Electronically Signed   By: Ulyses Jarred M.D.   On: 05/24/2017 22:38   Mr Cervical Spine Wo  Contrast  Result Date: 05/24/2017 CLINICAL DATA:  Cervical spine injury January 27. EXAM: MRI CERVICAL AND LUMBAR SPINE WITHOUT CONTRAST TECHNIQUE: Multiplanar and multiecho pulse sequences of the cervical spine, to include the craniocervical junction and cervicothoracic junction, and lumbar spine, were obtained without intravenous contrast. COMPARISON:  Cervical spine MRI 05/17/2017 FINDINGS: MRI CERVICAL SPINE FINDINGS Alignment: Physiologic. Vertebrae: Hyperintense T2-weighted signal focus at the C6 vertebral body with low T1-weighted signal, probably a fat poor hemangioma. No acute fracture. No discitis-osteomyelitis. Cord: Normal Posterior Fossa, vertebral arteries, paraspinal tissues: There is an acute dissection of the right vertebral artery. The artery is poorly visualized below the C4-5 level, but there is  clearly abnormal T2-weighted signal surrounding and above this level, which is new compared to 05/17/2017. Disc levels: C1-C2: Normal. C2-C3: Normal disc space and facets. No spinal canal or neuroforaminal stenosis. C3-C4: Small disc osteophyte complex with mild bilateral foraminal narrowing. C4-C5: Small disc osteophyte complex with mild right and moderate left neural foraminal stenosis. C5-C6: Bilobed disc protrusion effaces the thecal sac and deforms the spinal cord, unchanged. No signal change within the spinal cord. There is severe spinal canal stenosis with mild right and severe left neural foraminal stenosis. C6-C7: Small disc osteophyte complex with mild spinal canal stenosis and mild right, moderate left foraminal stenosis. C7-T1: Normal disc space and facets. No spinal canal or neuroforaminal stenosis. MRI LUMBAR SPINE FINDINGS Segmentation:  Standard. Alignment:  Physiologic. Vertebrae:  No fracture, evidence of discitis, or bone lesion. Conus medullaris and cauda equina: Conus extends to the L2 level. Conus and cauda equina appear normal. Paraspinal and other soft tissues: The visualized  vascular, retroperitoneal and paraspinal structures are normal. Disc levels: The T11-L4 levels are normal. L4-L5: Moderate facet hypertrophy with small central disc protrusion. No stenosis. L5-S1: Normal. IMPRESSION: 1. 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 The right vertebral artery origin and proximal V2 segment are incompletely visualized on this study. Brain MRI/MRA without contrast should be considered to assess for intracranial acute ischemia. CTA of the neck could provide further detail of the right vertebral artery, particularly its proximal portion, but this is not necessary for confirmation of the dissection. 2. 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 Severe left neural foraminal stenosis also at this level. 3. No acute abnormality or significant stenosis of the lumbar spine. These results were called by telephone at the time of interpretation on 05/24/2017 at 7:58 pm to Dr. Noemi Chapel , who verbally acknowledged these results. Electronically Signed   By: Ulyses Jarred M.D.   On: 05/24/2017 20:05   Mr Lumbar Spine Wo Contrast  Result Date: 05/24/2017 CLINICAL DATA:  Cervical spine injury January 27. EXAM: MRI CERVICAL AND LUMBAR SPINE WITHOUT CONTRAST TECHNIQUE: Multiplanar and multiecho pulse sequences of the cervical spine, to include the craniocervical junction and cervicothoracic junction, and lumbar spine, were obtained without intravenous contrast. COMPARISON:  Cervical spine MRI 05/17/2017 FINDINGS: MRI CERVICAL SPINE FINDINGS Alignment: Physiologic. Vertebrae: Hyperintense T2-weighted signal focus at the C6 vertebral body with low T1-weighted signal, probably a fat poor hemangioma. No acute fracture. No discitis-osteomyelitis. Cord: Normal Posterior Fossa, vertebral arteries, paraspinal tissues: There is an acute dissection of the right vertebral artery. The artery is poorly visualized  below the C4-5 level, but there is clearly abnormal T2-weighted signal surrounding and above this level, which is new compared to 05/17/2017. Disc levels: C1-C2: Normal. C2-C3: Normal disc space and facets. No spinal canal or neuroforaminal stenosis. C3-C4: Small disc osteophyte complex with mild bilateral foraminal narrowing. C4-C5: Small disc osteophyte complex with mild right and moderate left neural foraminal stenosis. C5-C6: Bilobed disc protrusion effaces the thecal sac and deforms the spinal cord, unchanged. No signal change within the spinal cord. There is severe spinal canal stenosis with mild right and severe left neural foraminal stenosis. C6-C7: Small disc osteophyte complex with mild spinal canal stenosis and mild right, moderate left foraminal stenosis. C7-T1: Normal disc space and facets. No spinal canal or neuroforaminal stenosis. MRI LUMBAR SPINE FINDINGS Segmentation:  Standard. Alignment:  Physiologic. Vertebrae:  No fracture, evidence of discitis, or bone  lesion. Conus medullaris and cauda equina: Conus extends to the L2 level. Conus and cauda equina appear normal. Paraspinal and other soft tissues: The visualized vascular, retroperitoneal and paraspinal structures are normal. Disc levels: The T11-L4 levels are normal. L4-L5: Moderate facet hypertrophy with small central disc protrusion. No stenosis. L5-S1: Normal. IMPRESSION: 1. 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 The right vertebral artery origin and proximal V2 segment are incompletely visualized on this study. Brain MRI/MRA without contrast should be considered to assess for intracranial acute ischemia. CTA of the neck could provide further detail of the right vertebral artery, particularly its proximal portion, but this is not necessary for confirmation of the dissection. 2. 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 Severe left neural  foraminal stenosis also at this level. 3. No acute abnormality or significant stenosis of the lumbar spine. These results were called by telephone at the time of interpretation on 05/24/2017 at 7:58 pm to Dr. Noemi Chapel , who verbally acknowledged these results. Electronically Signed   By: Ulyses Jarred M.D.   On: 05/24/2017 20:05    Procedures .Critical Care Performed by: Noemi Chapel, MD Authorized by: Noemi Chapel, MD   Critical care provider statement:    Critical care time (minutes):  35   Critical care time was exclusive of:  Separately billable procedures and treating other patients and teaching time   Critical care was necessary to treat or prevent imminent or life-threatening deterioration of the following conditions:  CNS failure or compromise   Critical care was time spent personally by me on the following activities:  Blood draw for specimens, development of treatment plan with patient or surrogate, discussions with consultants, evaluation of patient's response to treatment, examination of patient, obtaining history from patient or surrogate, ordering and performing treatments and interventions, ordering and review of laboratory studies, ordering and review of radiographic studies, pulse oximetry, re-evaluation of patient's condition and review of old charts   (including critical care time)  Medications Ordered in ED Medications  potassium chloride 10 mEq in 100 mL IVPB (0 mEq Intravenous Hold 05/24/17 2048)  acetaminophen (TYLENOL) tablet 650 mg (not administered)    Or  acetaminophen (TYLENOL) suppository 650 mg (not administered)  ondansetron (ZOFRAN) tablet 4 mg (not administered)    Or  ondansetron (ZOFRAN) injection 4 mg (not administered)  0.9 %  sodium chloride infusion ( Intravenous Transfusing/Transfer 05/25/17 1232)  morphine 4 MG/ML injection 1 mg (1 mg Intravenous Given 05/25/17 0701)  methocarbamol (ROBAXIN) 500 mg in dextrose 5 % 50 mL IVPB (0 mg Intravenous  Stopped 05/25/17 0318)  famotidine (PEPCID) IVPB 20 mg in NS 100 mL IVPB (20 mg Intravenous Given 05/25/17 1126)  HYDROmorphone (DILAUDID) injection 1 mg (1 mg Intravenous Given 05/24/17 1746)  dexamethasone (DECADRON) injection 10 mg (10 mg Intravenous Given 05/24/17 1746)  ondansetron (ZOFRAN) injection 4 mg (4 mg Intravenous Given 05/24/17 1759)  iopamidol (ISOVUE-370) 76 % injection (50 mLs  Contrast Given 05/24/17 2022)  promethazine (PHENERGAN) injection 25 mg (25 mg Intravenous Given by Other 05/24/17 2353)     Initial Impression / Assessment and Plan / ED Course  I have reviewed the triage vital signs and the nursing notes.  Pertinent labs & imaging results that were available during my care of the patient were reviewed by me and considered in my medical decision making (see chart for details).    The patient has  neurologic symptoms which are not clearly anatomical as far as an isolated location however he does have asymmetry left versus right with significant numbness on the left and pain with moving any of the 4 extremities but more pain with moving the left side.  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  Will discuss with neurology, have paged his surgeon, Dr. Lynann Bologna, MRI ordered.  Discussed with neurology at 5:35 PM they agree with urgent neurosurgical consultation, awaiting return call from the patient's spinal surgeon.  D/w Dr. Grandville Silos - on call for Dr. Lynann Bologna Will admit Will get MRI L and C spine Pt still with ongoing symptoms of numbness - seems to have some muscle tone - but limited due to pain and due to numbness. Potential significant spinal cord injury  The course of the patient stay in the emergency department I performed multiple neurologic exams, there is no significant improvement or decline.  MRI of C-spine and L-spine ordered.  Will be admitted to the surgical  service until it was seen that there was a possible disection of the [facility] - this was found to be a misread on CTA of the neck - Dr Oneida Alar was kind enough to evaluate - but no vascular source was fourd MR of the Brain revealed no stroke, yet the patient has significant findings which are not accounted for on imaging -  Will be admitted to hospitalist  Final Clinical Impressions(s) / ED Diagnoses   Final diagnoses:  Spinal cord compression [facility])  Weakness    ED Discharge Orders    None       Noemi Chapel, MD 05/25/17 1400    Noemi Chapel, MD 05/25/17 1402

## 2017-05-24 NOTE — Consult Note (Signed)
Referring Physician: Dr. Nevada Crane    Chief Complaint:   Zachary Nash is an 47 y.o. male presenting to the ED today from home for possible complications related to cervical spine injury on 1/27.  He injured his neck on 1/27 but was only seen at Urgent Care 2 weeks later.  He was then sent to [place] who performed MRI and diagnosed the patient with cervical myelopathy.  He has been off work since then, taking flexeril, robaxin and ultram for pain.  Today he was sitting on the toilet and went to stand then acutely felt like he couldn't walk. He sat down in a recliner and reached across body with his left arm and felt tingling and pain sensations to his neck. Per Triage nurse, he was barely able to move limbs and had decreased sensation to his entire body. EDP felt that this might be due to a worsened myelopathy, therefore an MRI of the cervical and thoracic spine was obtained, revealing a right vertebral artery dissection.  MRI cervical and thoracic impression:    1. 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 The right vertebral artery origin and proximal V2 segment are incompletely visualized on this study. Brain MRI/MRA without contrast should be considered to assess for intracranial acute ischemia. CTA of the neck could provide further detail of the right vertebral artery, particularly its proximal portion, but this is not necessary for confirmation of the dissection. 2. 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 Severe left neural foraminal stenosis also at this level. 3. No acute abnormality or significant stenosis of the lumbar spine   Past Medical History:  Diagnosis Date  . Anxiety   . Asthma   . Bilateral inguinal hernia 05/29/2016  . Complication of anesthesia    states he stopped breathing one time with anesthesia  . Constipation   . Fatty liver   . GERD  (gastroesophageal reflux disease)   . History of kidney stones   . Pneumonia   . PONV (postoperative nausea and vomiting)   . Radicular pain of left lower extremity     Past Surgical History:  Procedure Laterality Date  . CHOLECYSTECTOMY    . ESOPHAGEAL DILATION N/A 03/24/2017   Procedure: ESOPHAGEAL DILATION;  Surgeon: Rogene Houston, MD;  [facility];  Service: Endoscopy;  Laterality: N/A;  . ESOPHAGOGASTRODUODENOSCOPY N/A 03/24/2017   Procedure: ESOPHAGOGASTRODUODENOSCOPY (EGD);  Surgeon: Rogene Houston, MD;  Location: [facility];  Service: Endoscopy;  Laterality: N/A;  1155  . INGUINAL HERNIA REPAIR Bilateral 05/29/2016   Procedure: OPEN HERNIA REPAIR INGUINAL ADULT BILATERAL WITH insertion of MESH;  Surgeon: Fanny Skates, MD;  Location: [place];  Service: General;  Laterality: Bilateral;  . Lipoma removed      Family History  Problem Relation Age of Onset  . Heart failure Father   . Heart attack Father   . Ovarian cancer Mother   . Arthritis Mother   . Anxiety disorder Mother    Social History:  reports that  has never smoked. he has never used smokeless tobacco. He reports that he does not drink alcohol or use drugs.  Allergies:  Allergies  Allergen Reactions  . Penicillins Anaphylaxis and Swelling    Has patient had a PCN reaction causing immediate rash, facial/tongue/throat swelling, SOB or lightheadedness with hypotension: yes Has patient had a PCN reaction causing severe rash involving mucus membranes or  skin necrosis: yes Has patient had a PCN reaction that required hospitalization: no Has patient had a PCN reaction occurring within the last 10 years: no If all of the above answers are "NO", then may proceed with Cephalosporin use.    Medications:  Prior to Admission:  (Not in a hospital admission) Scheduled:  Continuous: . sodium chloride 75 mL/hr at 05/25/17 0246  . methocarbamol (ROBAXIN)  IV 500 mg (05/25/17 0248)  . potassium chloride  Stopped (05/24/17 2048)   GHW:EXHBZJIRCVELF **OR** acetaminophen, methocarbamol (ROBAXIN)  IV, morphine injection, ondansetron **OR** ondansetron (ZOFRAN) IV  ROS: Severe neck pain, worse on the left. Numbness and tingling to forearms and hands bilaterally, worse on the left. Numbness and tingling to lower extremities from hips to toes, worse on the left.   Physical Examination: Blood pressure (!) 141/99, pulse 84, temperature 99.2 F (37.3 C), temperature source Oral, resp. rate 13, height 5' 11"  (1.803 m), weight 88.5 kg (195 lb), SpO2 100 %.  HEENT: Cervical collar in place. No signs of trauma to forehead/face Lungs: Respirations unlabored Ext: No edema  Neurologic Examination: Mental Status: Intact to complex questions and commands.  Cranial Nerves: II:  Visual fields intact bilaterally. PERRL.  III,IV, VI: EOMI without nystagmus. No ptosis.  V,VII: Grimace is symmetric, facial temp sensation normal bilaterally VIII: hearing intact to voice IX,X: No hoarseness. Initially hypophonic, which resolves with distraction.  XI: Unable to assess due to c-collar XII: midline tongue extension  Motor: Severely compromised by pain. Moves LUE with max 4/5 strength in the context of pain. Max strength LUE 4-/5. Some inconsistencies in level of resistance by patient bilaterally, in conjunction with effortful affect which may be embellished.  RLE and LLE: Will not raise above bed but with resistance when examiner attempts to raise, in conjunction with severe leg and neck pain elicited by passive motion. Guards against passive movement therefore unable to definitively assess tone.  Sensory: Subjectively decreased FT and temp sensation on the left. Apparent extinction to DSS on left.  Deep Tendon Reflexes:  2+ bilateral biceps, brachioradialis, patellae and achilles.  Right toe equivocal upgoing, left toe equivocal up/downgoing in conjunction with non-stereotyped dorsiflexion of feet to plantar  stimulation.  Cerebellar: Slow and effortful FNF on the right without ataxia; unable to perform on left. Unable to perform heel-shin bilaterally.  Gait: Deferred  Results for orders placed or performed during the hospital encounter of 05/24/17 (from the past 48 hour(s))  CBC with Differential/Platelet     Status: Abnormal   Collection Time: 05/24/17  5:40 PM  Result Value Ref Range   WBC 6.9 4.0 - 10.5 K/uL   RBC 5.39 4.22 - 5.81 MIL/uL   Hemoglobin 16.4 13.0 - 17.0 g/dL   HCT 44.7 39.0 - 52.0 %   MCV 82.9 78.0 - 100.0 fL   MCH 30.4 26.0 - 34.0 pg   MCHC 36.7 (H) 30.0 - 36.0 g/dL   RDW 12.9 11.5 - 15.5 %   Platelets 218 150 - 400 K/uL   Neutrophils Relative % 57 %   Neutro Abs 3.9 1.7 - 7.7 K/uL   Lymphocytes Relative 32 %   Lymphs Abs 2.2 0.7 - 4.0 K/uL   Monocytes Relative 8 %   Monocytes Absolute 0.6 0.1 - 1.0 K/uL   Eosinophils Relative 3 %   Eosinophils Absolute 0.2 0.0 - 0.7 K/uL   Basophils Relative 0 %   Basophils Absolute 0.0 0.0 - 0.1 K/uL    Comment: Performed at [facility], [place] [address], [place], [place] [address]  Comprehensive metabolic panel     Status: Abnormal   Collection Time: 05/24/17  5:40 PM  Result Value Ref Range   Sodium 139 135 - 145 mmol/L   Potassium 3.1 (L) 3.5 - 5.1 mmol/L   Chloride 109 101 - 111 mmol/L   CO2 19 (L) 22 - 32 mmol/L   Glucose, Bld 105 (H) 65 - 99 mg/dL   BUN 19 6 - 20 mg/dL   Creatinine, Ser 1.36 (H) 0.61 - 1.24 mg/dL   Calcium 9.2 8.9 - 10.3 mg/dL   Total Protein 6.9 6.5 - 8.1 g/dL   Albumin 4.1 3.5 - 5.0 g/dL   AST 33 15 - 41 U/L   ALT 39 17 - 63 U/L   Alkaline Phosphatase 65 38 - 126 U/L   Total Bilirubin 0.6 0.3 - 1.2 mg/dL   GFR calc non Af Amer >60 >60 mL/min   GFR calc Af Amer >60 >60 mL/min    Comment: (NOTE) The eGFR has been calculated using the CKD EPI equation. This calculation has not been validated in all clinical situations. eGFR's persistently <60 mL/min signify possible Chronic  Kidney Disease.    Anion gap 11 5 - 15    Comment: Performed at [facility] [address], [place], [place] [address]  I-stat chem 8, ed     Status: Abnormal   Collection Time: 05/24/17  5:46 PM  Result Value Ref Range   Sodium 142 135 - 145 mmol/L   Potassium 3.0 (L) 3.5 - 5.1 mmol/L   Chloride 108 101 - 111 mmol/L   BUN 20 6 - 20 mg/dL   Creatinine, Ser 1.30 (H) 0.61 - 1.24 mg/dL   Glucose, Bld 98 65 - 99 mg/dL   Calcium, Ion 1.08 (L) 1.15 - 1.40 mmol/L   TCO2 18 (L) 22 - 32 mmol/L   Hemoglobin 16.0 13.0 - 17.0 g/dL   HCT 47.0 39.0 - 52.0 %   Mr Cervical Spine Wo Contrast  Result Date: 05/24/2017 CLINICAL DATA:  Cervical spine injury January 27. EXAM: MRI CERVICAL AND LUMBAR SPINE WITHOUT CONTRAST TECHNIQUE: Multiplanar and multiecho pulse sequences of the cervical spine, to include the craniocervical junction and cervicothoracic junction, and lumbar spine, were obtained without intravenous contrast. COMPARISON:  Cervical spine MRI 05/17/2017 FINDINGS: MRI CERVICAL SPINE FINDINGS Alignment: Physiologic. Vertebrae: Hyperintense T2-weighted signal focus at the C6 vertebral body with low T1-weighted signal, probably a fat poor hemangioma. No acute fracture. No discitis-osteomyelitis. Cord: Normal Posterior Fossa, vertebral arteries, paraspinal tissues: There is an acute dissection of the right vertebral artery. The artery is poorly visualized below the C4-5 level, but there is clearly abnormal T2-weighted signal surrounding and above this level, which is new compared to 05/17/2017. Disc levels: C1-C2: Normal. C2-C3: Normal disc space and facets. No spinal canal or neuroforaminal stenosis. C3-C4: Small disc osteophyte complex with mild bilateral foraminal narrowing. C4-C5: Small disc osteophyte complex with mild right and moderate left neural foraminal stenosis. C5-C6: Bilobed disc protrusion effaces the thecal sac and deforms the spinal cord, unchanged. No signal change within the spinal  cord. There is severe spinal canal stenosis with mild right and severe left neural foraminal stenosis. C6-C7: Small disc osteophyte complex with mild spinal canal stenosis and mild right, moderate left foraminal stenosis. C7-T1: Normal disc space and facets. No spinal canal or neuroforaminal stenosis. MRI LUMBAR SPINE FINDINGS Segmentation:  Standard. Alignment:  Physiologic. Vertebrae:  No fracture, evidence  of discitis, or bone lesion. Conus medullaris and cauda equina: Conus extends to the L2 level. Conus and cauda equina appear normal. Paraspinal and other soft tissues: The visualized vascular, retroperitoneal and paraspinal structures are normal. Disc levels: The T11-L4 levels are normal. L4-L5: Moderate facet hypertrophy with small central disc protrusion. No stenosis. L5-S1: Normal. IMPRESSION: 1. 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 The right vertebral artery origin and proximal V2 segment are incompletely visualized on this study. Brain MRI/MRA without contrast should be considered to assess for intracranial acute ischemia. CTA of the neck could provide further detail of the right vertebral artery, particularly its proximal portion, but this is not necessary for confirmation of the dissection. 2. 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 Severe left neural foraminal stenosis also at this level. 3. No acute abnormality or significant stenosis of the lumbar spine. These results were called by telephone at the time of interpretation on 05/24/2017 at 7:58 pm to Dr. Noemi Chapel , who verbally acknowledged these results. Electronically Signed   By: Ulyses Jarred M.D.   On: 05/24/2017 20:05   Mr Lumbar Spine Wo Contrast  Result Date: 05/24/2017 CLINICAL DATA:  Cervical spine injury January 27. EXAM: MRI CERVICAL AND LUMBAR SPINE WITHOUT CONTRAST TECHNIQUE: Multiplanar and multiecho pulse sequences of the  cervical spine, to include the craniocervical junction and cervicothoracic junction, and lumbar spine, were obtained without intravenous contrast. COMPARISON:  Cervical spine MRI 05/17/2017 FINDINGS: MRI CERVICAL SPINE FINDINGS Alignment: Physiologic. Vertebrae: Hyperintense T2-weighted signal focus at the C6 vertebral body with low T1-weighted signal, probably a fat poor hemangioma. No acute fracture. No discitis-osteomyelitis. Cord: Normal Posterior Fossa, vertebral arteries, paraspinal tissues: There is an acute dissection of the right vertebral artery. The artery is poorly visualized below the C4-5 level, but there is clearly abnormal T2-weighted signal surrounding and above this level, which is new compared to 05/17/2017. Disc levels: C1-C2: Normal. C2-C3: Normal disc space and facets. No spinal canal or neuroforaminal stenosis. C3-C4: Small disc osteophyte complex with mild bilateral foraminal narrowing. C4-C5: Small disc osteophyte complex with mild right and moderate left neural foraminal stenosis. C5-C6: Bilobed disc protrusion effaces the thecal sac and deforms the spinal cord, unchanged. No signal change within the spinal cord. There is severe spinal canal stenosis with mild right and severe left neural foraminal stenosis. C6-C7: Small disc osteophyte complex with mild spinal canal stenosis and mild right, moderate left foraminal stenosis. C7-T1: Normal disc space and facets. No spinal canal or neuroforaminal stenosis. MRI LUMBAR SPINE FINDINGS Segmentation:  Standard. Alignment:  Physiologic. Vertebrae:  No fracture, evidence of discitis, or bone lesion. Conus medullaris and cauda equina: Conus extends to the L2 level. Conus and cauda equina appear normal. Paraspinal and other soft tissues: The visualized vascular, retroperitoneal and paraspinal structures are normal. Disc levels: The T11-L4 levels are normal. L4-L5: Moderate facet hypertrophy with small central disc protrusion. No stenosis. L5-S1:  Normal. IMPRESSION: 1. 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 The right vertebral artery origin and proximal V2 segment are incompletely visualized on this study. Brain MRI/MRA without contrast should be considered to assess for intracranial acute ischemia. CTA of the neck could provide further detail of the right vertebral artery, particularly its proximal portion, but this is not necessary for confirmation of the dissection. 2. Unchanged severe spinal canal stenosis at C5-C6 with cord  deformity but no signal change. Severe left neural foraminal stenosis also at this level. 3. No acute abnormality or significant stenosis of the lumbar spine. These results were called by telephone at the time of interpretation on 05/24/2017 at 7:58 pm to Dr. Noemi Chapel , who verbally acknowledged these results. Electronically Signed   By: Ulyses Jarred M.D.   On: 05/24/2017 20:05    Assessment: 47 y.o. male with acute inability to walk, bilateral forearm/hand numbness and paresthesias, bilateral lower extremity numbness and paresthesia in the context of cervical spine injury on 1/27 and recent diagnosis of cervical myelopathy based upon C-spine MRI 1. Exam reveals no definitively localizable findings given effort-dependence and apparent severe pain elicited with movement.  2. MRI cervical spine reveals an acute right vertebral artery dissection, extending from at least the C5 level to the skull base. However, follow up CTA reveals no dissection. Per Radiology report: "The severe narrowing of the right vertebral artery flow void seen on the same day MRI is not confirmed on this study. Retrospectively, the right vertebral abnormality was present only on the T2-weighted CUBE sequence and not on the traditional T2-weighted sequence. Thus, it is probably artifactual. Given the combination of all these factors the right vertebral artery is considered normal without evidence  of dissection."  3. Also noted on MRI cervical is unchanged severe spinal canal stenosis at C5-C6 with cord deformity but no signal change. Severe left neural foraminal stenosis also noted at this level.  Plan: 1. Not a tPA or an endovascular candidate due to dissection.  2. MRI of the brain without contrast 3. CTA of head and neck 4. Frequent neuro checks  Addendum: - Follow up CTA reveals no dissection. Per Radiology report: "The severe narrowing of the right vertebral artery flow void seen on the same day MRI is not confirmed on this study. Retrospectively, the right vertebral abnormality was present only on the T2-weighted CUBE sequence and not on the traditional T2-weighted sequence. Thus, it is probably artifactual. Given the combination of all these factors the right vertebral artery is considered normal without evidence of dissection."  - MRI brain to evaluate for possible stroke in the context of cervical spine vascular finding: Normal.   Recommend: Pain management. Orthopedics consult to clear neck. When cleared, obtain PT and OT consults.   @Electronically  signed: Dr. Kerney Elbe 05/24/2017, 8:11 PM

## 2017-05-24 NOTE — Consult Note (Signed)
Referring Physician: Noemi Chapel MD  Patient name: Zachary Nash MRN: [id number] DOB: 03-Dec-1970 Sex: male  REASON FOR CONSULT: possible vertebral artery dissection  HPI: Zachary Nash is a 47 y.o. male with fairly sudden onset of numbness weakness left arm left leg with pain around 8 am today.  He hurt his neck at work about 1 week ago and was undergoing work up for cervical disc compression.  His symptoms got worse today.  He states he still has numbness in the left arm and  Weakness as well as the left leg.  His neck hurts when he tries to move his left arm.  Denies history of TIA amaurosis or stroke.  He has no history of afib. Other medical problems include anxiety and asthma.  Past Medical History:  Diagnosis Date  . Anxiety   . Asthma   . Bilateral inguinal hernia 05/29/2016  . Complication of anesthesia    states he stopped breathing one time with anesthesia  . Constipation   . Fatty liver   . GERD (gastroesophageal reflux disease)   . History of kidney stones   . Pneumonia   . PONV (postoperative nausea and vomiting)   . Radicular pain of left lower extremity    Past Surgical History:  Procedure Laterality Date  . CHOLECYSTECTOMY    . ESOPHAGEAL DILATION N/A 03/24/2017   Procedure: ESOPHAGEAL DILATION;  Surgeon: Rogene Houston, MD;  Location: [facility];  Service: Endoscopy;  Laterality: N/A;  . ESOPHAGOGASTRODUODENOSCOPY N/A 03/24/2017   Procedure: ESOPHAGOGASTRODUODENOSCOPY (EGD);  Surgeon: Rogene Houston, MD;  Location: [facility];  Service: Endoscopy;  Laterality: N/A;  1155  . INGUINAL HERNIA REPAIR Bilateral 05/29/2016   Procedure: OPEN HERNIA REPAIR INGUINAL ADULT BILATERAL WITH insertion of MESH;  Surgeon: Fanny Skates, MD;  Location: [facility];  Service: General;  Laterality: Bilateral;  . Lipoma removed      Family History  Problem Relation Age of Onset  . Heart failure Father   . Heart attack Father   . Ovarian cancer Mother   . Arthritis  Mother   . Anxiety disorder Mother     SOCIAL HISTORY: Social History   Socioeconomic History  . Marital status: Legally Separated    Spouse name: Not on file  . Number of children: Not on file  . Years of education: Not on file  . Highest education level: Not on file  Social Needs  . Financial resource strain: Not on file  . Food insecurity - worry: Not on file  . Food insecurity - inability: Not on file  . Transportation needs - medical: Not on file  . Transportation needs - non-medical: Not on file  Occupational History  . Not on file  Tobacco Use  . Smoking status: Never Smoker  . Smokeless tobacco: Never Used  Substance and Sexual Activity  . Alcohol use: No  . Drug use: No  . Sexual activity: Not on file  Other Topics Concern  . Not on file  Social History Narrative  . Not on file    Allergies  Allergen Reactions  . Penicillins Anaphylaxis and Swelling    Has patient had a PCN reaction causing immediate rash, facial/tongue/throat swelling, SOB or lightheadedness with hypotension: yes Has patient had a PCN reaction causing severe rash involving mucus membranes or skin necrosis: yes Has patient had a PCN reaction that required hospitalization: no Has patient had a PCN reaction occurring within the last 10 years: no If  all of the above answers are "NO", then may proceed with Cephalosporin use.    Current Facility-Administered Medications  Medication Dose Route Frequency Provider Last Rate Last Dose  . LORazepam (ATIVAN) injection 1 mg  1 mg Intravenous Once Noemi Chapel, MD      . ondansetron [facility]) injection 4 mg  4 mg Intravenous Once Noemi Chapel, MD      . potassium chloride 10 mEq in 100 mL IVPB  10 mEq Intravenous Once Noemi Chapel, MD   Stopped at 05/24/17 2048   Current Outpatient Medications  Medication Sig Dispense Refill  . albuterol (PROVENTIL HFA;VENTOLIN HFA) 108 (90 BASE) MCG/ACT inhaler Inhale 2 puffs into the lungs every 6 (six) hours as  needed for wheezing.    . cyclobenzaprine (FLEXERIL) 10 MG tablet Take 10 mg by mouth at bedtime.    . famotidine (PEPCID) 20 MG tablet Take 1 tablet (20 mg total) by mouth at bedtime.    . methocarbamol (ROBAXIN) 750 MG tablet Take 750 mg by mouth every morning.    . Multiple Vitamins-Minerals (ALIVE MENS ENERGY PO) Take 1 tablet by mouth daily.    . pantoprazole (PROTONIX) 40 MG tablet Take 1 tablet (40 mg total) by mouth daily before breakfast. 90 tablet 1  . traMADol (ULTRAM) 50 MG tablet Take 1 tablet (50 mg total) by mouth every 6 (six) hours as needed. 20 tablet 0    ROS:   General:  No weight loss, Fever, chills  HEENT: No recent headaches, no nasal bleeding, no visual changes, no sore throat  Neurologic: No dizziness, blackouts, seizures. No recent symptoms of stroke or mini- stroke. No recent episodes of slurred speech, or temporary blindness.  Cardiac: No recent episodes of chest pain/pressure, no shortness of breath at rest.  No shortness of breath with exertion.  Denies history of atrial fibrillation or irregular heartbeat  Vascular: No history of rest pain in feet.  No history of claudication.  No history of non-healing ulcer, No history of DVT   Pulmonary: No home oxygen, no productive cough, no hemoptysis,  No asthma or wheezing  Musculoskeletal:  [ ]  Arthritis, [ ]  Low back pain,  [ ]  Joint pain  Hematologic:No history of hypercoagulable state.  No history of easy bleeding.  No history of anemia  Gastrointestinal: No hematochezia or melena,  No gastroesophageal reflux, no trouble swallowing  Urinary: [ ]  chronic Kidney disease, [ ]  on HD - [ ]  MWF or [ ]  TTHS, [ ]  Burning with urination, [ ]  Frequent urination, [ ]  Difficulty urinating;   Skin: No rashes  Psychological: No history of anxiety,  No history of depression   Physical Examination  Vitals:   05/24/17 1732 05/24/17 1745 05/24/17 1800 05/24/17 2045  BP:  (!) 123/91 (!) 141/99 (!) 150/97  Pulse:  86 84  (!) 106  Resp:  12 13 (!) 22  Temp:      TempSrc:      SpO2:  99% 100% 99%  Weight: 195 lb (88.5 kg)     Height: 5' 11"  (1.803 m)       Body mass index is 27.2 kg/m.  General:  Alert and oriented, appears in pain pale diaphoretic HEENT: Normal Neck: No bruit or JVD Pulmonary: Clear to auscultation bilaterally Cardiac: Regular Rate and Rhythm  Abdomen: Soft, non-tender, non-distended, no mass Skin: No rash Extremity Pulses:  2+ radial, brachial, femoral, dorsalis pedis, posterior tibial pulses bilaterally Musculoskeletal: No deformity or edema  Neurologic: Upper and  lower extremity motor 3/5 and symmetric, subjective numbness and tingling diffuse left arm left leg  DATA:  MRI cspine cervical nerve root compression, ? Right vertebral dissection  CTA head and neck.  No acute stroke.   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 No evidence of vascular disease   PLAN:  1. No vascular surgical issue at this point  2. Dr Grandville Silos from Ortho evaluating pt   Ruta Hinds, MD [facility] Office: [phone] Pager: [phone]

## 2017-05-25 ENCOUNTER — Other Ambulatory Visit: Payer: Self-pay

## 2017-05-25 ENCOUNTER — Encounter (HOSPITAL_COMMUNITY)
Admission: EM | Disposition: A | Discharge status: Home Health | Payer: Self-pay | Source: Home / Self Care | Attending: Nephrology

## 2017-05-25 ENCOUNTER — Encounter (HOSPITAL_COMMUNITY): Payer: Self-pay | Admitting: Internal Medicine

## 2017-05-25 ENCOUNTER — Observation Stay (HOSPITAL_COMMUNITY): Payer: BLUE CROSS/BLUE SHIELD

## 2017-05-25 DIAGNOSIS — G959 Disease of spinal cord, unspecified: Secondary | ICD-10-CM | POA: Diagnosis not present

## 2017-05-25 DIAGNOSIS — Z79899 Other long term (current) drug therapy: Secondary | ICD-10-CM | POA: Diagnosis not present

## 2017-05-25 DIAGNOSIS — Z9049 Acquired absence of other specified parts of digestive tract: Secondary | ICD-10-CM | POA: Diagnosis not present

## 2017-05-25 DIAGNOSIS — R200 Anesthesia of skin: Secondary | ICD-10-CM | POA: Diagnosis not present

## 2017-05-25 DIAGNOSIS — Z419 Encounter for procedure for purposes other than remedying health state, unspecified: Secondary | ICD-10-CM | POA: Diagnosis not present

## 2017-05-25 DIAGNOSIS — M4802 Spinal stenosis, cervical region: Secondary | ICD-10-CM | POA: Diagnosis present

## 2017-05-25 DIAGNOSIS — M5410 Radiculopathy, site unspecified: Secondary | ICD-10-CM | POA: Diagnosis not present

## 2017-05-25 DIAGNOSIS — R531 Weakness: Secondary | ICD-10-CM

## 2017-05-25 DIAGNOSIS — G9520 Unspecified cord compression: Secondary | ICD-10-CM | POA: Diagnosis present

## 2017-05-25 DIAGNOSIS — K219 Gastro-esophageal reflux disease without esophagitis: Secondary | ICD-10-CM | POA: Diagnosis present

## 2017-05-25 DIAGNOSIS — R55 Syncope and collapse: Secondary | ICD-10-CM | POA: Diagnosis not present

## 2017-05-25 DIAGNOSIS — J45909 Unspecified asthma, uncomplicated: Secondary | ICD-10-CM | POA: Diagnosis present

## 2017-05-25 DIAGNOSIS — F419 Anxiety disorder, unspecified: Secondary | ICD-10-CM | POA: Diagnosis present

## 2017-05-25 DIAGNOSIS — M5412 Radiculopathy, cervical region: Secondary | ICD-10-CM | POA: Diagnosis present

## 2017-05-25 LAB — BASIC METABOLIC PANEL
Anion gap: 10 (ref 5–15)
BUN: 22 mg/dL — ABNORMAL HIGH (ref 6–20)
CALCIUM: 8.6 mg/dL — AB (ref 8.9–10.3)
CO2: 17 mmol/L — AB (ref 22–32)
CREATININE: 1.23 mg/dL (ref 0.61–1.24)
Chloride: 111 mmol/L (ref 101–111)
GFR calc Af Amer: >60 mL/min (ref >60)
GFR calc non Af Amer: >60 mL/min (ref >60)
GLUCOSE: 178 mg/dL — AB (ref 65–99)
Potassium: 4.7 mmol/L (ref 3.5–5.1)
Sodium: 138 mmol/L (ref 135–145)

## 2017-05-25 LAB — GLUCOSE, CAPILLARY
Glucose-Capillary: 147 mg/dL — ABNORMAL HIGH (ref 65–99)
Glucose-Capillary: 95 mg/dL (ref 65–99)
Glucose-Capillary: 142 mg/dL — ABNORMAL HIGH (ref 65–99)

## 2017-05-25 LAB — CBC
HCT: 43.3 % (ref 39.0–52.0)
HEMOGLOBIN: 15.3 g/dL (ref 13.0–17.0)
MCH: 29.5 pg (ref 26.0–34.0)
MCHC: 35.3 g/dL (ref 30.0–36.0)
MCV: 83.6 fL (ref 78.0–100.0)
PLATELETS: 185 10*3/uL (ref 150–400)
RBC: 5.18 MIL/uL (ref 4.22–5.81)
RDW: 12.5 % (ref 11.5–15.5)
WBC: 8.3 10*3/uL (ref 4.0–10.5)

## 2017-05-25 LAB — TYPE AND SCREEN
ABO/RH(D): A POS
Antibody Screen: NEGATIVE

## 2017-05-25 LAB — HIV ANTIBODY (ROUTINE TESTING W REFLEX): HIV SCREEN 4TH GENERATION: NONREACTIVE

## 2017-05-25 LAB — ABO/RH: ABO/RH(D): A POS

## 2017-05-25 LAB — MRSA PCR SCREENING: MRSA by PCR: NEGATIVE

## 2017-05-25 SURGERY — ANTERIOR CERVICAL DECOMPRESSION/DISCECTOMY FUSION 2 LEVELS
Anesthesia: General

## 2017-05-25 MED ORDER — ONDANSETRON HCL 4 MG PO TABS: 4.0000 mg | ORAL_TABLET | Freq: Four times a day (QID) | ORAL | Status: DC | PRN

## 2017-05-25 MED ORDER — FAMOTIDINE 20 MG IN NS 100 ML IVPB
20.0000 mg | Freq: Two times a day (BID) | INTRAVENOUS | Status: DC
  Administered 2017-05-25 – 2017-05-26 (×4): 20 mg via INTRAVENOUS
  Filled 2017-05-25 (×7): qty 100

## 2017-05-25 MED ORDER — METHOCARBAMOL 1000 MG/10ML IJ SOLN
500.0000 mg | Freq: Three times a day (TID) | INTRAVENOUS | Status: DC | PRN
  Administered 2017-05-25 (×2): 500 mg via INTRAVENOUS
  Filled 2017-05-25: qty 550
  Filled 2017-05-25: qty 5

## 2017-05-25 MED ORDER — ONDANSETRON HCL 4 MG/2ML IJ SOLN: 4.0000 mg | Freq: Four times a day (QID) | INTRAMUSCULAR | Status: DC | PRN

## 2017-05-25 MED ORDER — ACETAMINOPHEN 650 MG RE SUPP: 650.0000 mg | Freq: Four times a day (QID) | RECTAL | Status: DC | PRN

## 2017-05-25 MED ORDER — ACETAMINOPHEN 325 MG PO TABS
650.0000 mg | ORAL_TABLET | Freq: Four times a day (QID) | ORAL | Status: DC | PRN
  Administered 2017-05-27: 650 mg via ORAL
  Filled 2017-05-25: qty 2

## 2017-05-25 MED ORDER — SODIUM CHLORIDE 0.9 % IV SOLN
INTRAVENOUS | Status: DC
  Administered 2017-05-25 (×2): via INTRAVENOUS

## 2017-05-25 MED ORDER — MORPHINE SULFATE (PF) 4 MG/ML IV SOLN
1.0000 mg | INTRAVENOUS | Status: DC | PRN
  Administered 2017-05-25 (×2): 1 mg via INTRAVENOUS
  Filled 2017-05-25 (×4): qty 1

## 2017-05-25 NOTE — Progress Notes (Incomplete)
0400: Handoff report received from ED RN. I went to [facility] with another RN to transfer pt to 4N06. Pt settled in room and oriented to unit.  0500: Pt sister Niger called and updated; transferred to pt's room phone.

## 2017-05-25 NOTE — Progress Notes (Signed)
   05/25/17 0900  Clinical Encounter Type  Visited With Patient  Visit Type Follow-up  Referral From Nurse  Consult/Referral To Chaplain  Spiritual Encounters  Spiritual Needs Prayer;Emotional  Stress Factors  Patient Stress Factors Health changes  Chaplain  Visited with the PT in order to review AD paperwork and to Pray.  The PT has recently experienced drastic health changes and is dedicated to praying his way through it.  The PT asked for Chaplain to pray for healing and to pray for comfort.  PT Father is on the way to sit with the PT.

## 2017-05-25 NOTE — Procedures (Signed)
EEG Report  Clinical History:  Episode of staring and non-responsiveness lasting about a minute.  Technical Summary:  A 19 channel digital EEG recording was performed using the 10-20 international system of electrode placement.  Bipolar and Referential montages were used.  The total recording time was approx 20 minutes.  Findings:  There is a posterior rhythm of 7-8 Hz, more often the former, reactive to eye opening and closure.  Both hemispheres are symmetrical.  No focal slowing is present.  Photic stimulation produces no driving response and does not elicit any abnormalities.  There are no epileptiform discharges or electrographic seizures present.    Impression:  There is evidence of a very mild generalized slowing of brain activity.  This is non-specific, but may be due to medication effects, toxic, metabolic, hypoxic, or infectious etiologies.  Clinical correlation is recommended.     Rogue Jury, MS, MD

## 2017-05-25 NOTE — ED Notes (Signed)
Attempted report to [facility], per Barnett Applebaum pt is to get different bed assignment.

## 2017-05-25 NOTE — ED Notes (Signed)
Dr. Cheral Marker neurology at bedside

## 2017-05-25 NOTE — Progress Notes (Signed)
EEG complete - results pending 

## 2017-05-25 NOTE — Progress Notes (Signed)
Patient's vital signs have remained stable since this AM and neurology has been made aware of patient's episode from this morning. As previously documented, patient will need an ACDF procedure at some point, given his spinal cord compression and cervical myelopathy. If Dr. Horris Latino and neurology both feel that patient is medically stable for a 3 hour cervical surgery with no medical contraindications, I would be able to proceed with his needed surgery tomorrow afternoon, so I would kindly ask both services to comment on Zachary Nash's medical status and whether he is stable for an anticipated 3 hour surgery, which can potentially happen tomorrow.

## 2017-05-25 NOTE — Consult Note (Signed)
Reason for Consult:Weakness, pain Referring Physician: [name] is an 47 y.o. male.  HPI: Patientis an 48 y.o.malepresenting to the ED yesterday from home.  He reports initial injury on 04-25-17, when he was pulling backwards on a type of a release for a large truck at work.  MRI scan was performed on 05-17-17, revealing canal and neuroforaminal stenosis, and he was evaluated by me  on 05-21-17, when I recommended two-level ACDF.    He reports having been off work since then, taking flexeril, robaxin and ultram for pain. Yesterday sitting on thetoilet and went to stand then abruptly was unable to stand.  Heremained in the recliner and reached across body withhis leftarm and felt tingling and pain sensations to hisneck. Per Triage nurse, he wasbarely able to move limbs and haddecreased sensation to hisentire body. EDP felt that this might be due to a worsened myelopathy, therefore an MRI of the cervical and lumbar spine, with fairly stable findings on cervical MRI when compared to 05-17-17, with the exception of a new right vertebral artery dissection, which was actually an inaccurate read per a subsequent CTA .    The patient feels that since yesterday, his weakness has improved.  The patient does report a history of anxiety.    Past Medical History:  Diagnosis Date  . Anxiety   . Asthma   . Bilateral inguinal hernia 05/29/2016  . Complication of anesthesia    states he stopped breathing one time with anesthesia  . Constipation   . Fatty liver   . GERD (gastroesophageal reflux disease)   . History of kidney stones   . Pneumonia   . PONV (postoperative nausea and vomiting)   . Radicular pain of left lower extremity     Past Surgical History:  Procedure Laterality Date  . CHOLECYSTECTOMY    . ESOPHAGEAL DILATION N/A 03/24/2017   Procedure: ESOPHAGEAL DILATION;  Surgeon: Rogene Houston, MD;  [facility];  Service: Endoscopy;  Laterality: N/A;   . ESOPHAGOGASTRODUODENOSCOPY N/A 03/24/2017   Procedure: ESOPHAGOGASTRODUODENOSCOPY (EGD);  Surgeon: Rogene Houston, MD;  Location: [facility];  Service: Endoscopy;  Laterality: N/A;  1155  . INGUINAL HERNIA REPAIR Bilateral 05/29/2016   Procedure: OPEN HERNIA REPAIR INGUINAL ADULT BILATERAL WITH insertion of MESH;  Surgeon: Fanny Skates, MD;  Location: [facility];  Service: General;  Laterality: Bilateral;  . Lipoma removed      Family History  Problem Relation Age of Onset  . Heart failure Father   . Heart attack Father   . Ovarian cancer Mother   . Arthritis Mother   . Anxiety disorder Mother     Social History:  reports that  has never smoked. he has never used smokeless tobacco. He reports that he does not drink alcohol or use drugs.  Allergies:  Allergies  Allergen Reactions  . Penicillins Anaphylaxis and Swelling    Has patient had a PCN reaction causing immediate rash, facial/tongue/throat swelling, SOB or lightheadedness with hypotension: yes Has patient had a PCN reaction causing severe rash involving mucus membranes or skin necrosis: yes Has patient had a PCN reaction that required hospitalization: no Has patient had a PCN reaction occurring within the last 10 years: no If all of the above answers are "NO", then may proceed with Cephalosporin use.    Medications: I have reviewed the patient's current medications.  Results for orders placed or performed during the hospital encounter of 05/24/17 (from the past 48 hour(s))  CBC with Differential/Platelet     Status: Abnormal   Collection Time: 05/24/17  5:40 PM  Result Value Ref Range   WBC 6.9 4.0 - 10.5 K/uL   RBC 5.39 4.22 - 5.81 MIL/uL   Hemoglobin 16.4 13.0 - 17.0 g/dL   HCT 44.7 39.0 - 52.0 %   MCV 82.9 78.0 - 100.0 fL   MCH 30.4 26.0 - 34.0 pg   MCHC 36.7 (H) 30.0 - 36.0 g/dL   RDW 12.9 11.5 - 15.5 %   Platelets 218 150 - 400 K/uL   Neutrophils Relative % 57 %   Neutro Abs 3.9 1.7 - 7.7 K/uL    Lymphocytes Relative 32 %   Lymphs Abs 2.2 0.7 - 4.0 K/uL   Monocytes Relative 8 %   Monocytes Absolute 0.6 0.1 - 1.0 K/uL   Eosinophils Relative 3 %   Eosinophils Absolute 0.2 0.0 - 0.7 K/uL   Basophils Relative 0 %   Basophils Absolute 0.0 0.0 - 0.1 K/uL    Comment: Performed at [facility] [address] [place], [place] [address]  Comprehensive metabolic panel     Status: Abnormal   Collection Time: 05/24/17  5:40 PM  Result Value Ref Range   Sodium 139 135 - 145 mmol/L   Potassium 3.1 (L) 3.5 - 5.1 mmol/L   Chloride 109 101 - 111 mmol/L   CO2 19 (L) 22 - 32 mmol/L   Glucose, Bld 105 (H) 65 - 99 mg/dL   BUN 19 6 - 20 mg/dL   Creatinine, Ser 1.36 (H) 0.61 - 1.24 mg/dL   Calcium 9.2 8.9 - 10.3 mg/dL   Total Protein 6.9 6.5 - 8.1 g/dL   Albumin 4.1 3.5 - 5.0 g/dL   AST 33 15 - 41 U/L   ALT 39 17 - 63 U/L   Alkaline Phosphatase 65 38 - 126 U/L   Total Bilirubin 0.6 0.3 - 1.2 mg/dL   GFR calc non Af Amer >60 >60 mL/min   GFR calc Af Amer >60 >60 mL/min    Comment: (NOTE) The eGFR has been calculated using the CKD EPI equation. This calculation has not been validated in all clinical situations. eGFR's persistently <60 mL/min signify possible Chronic Kidney Disease.    Anion gap 11 5 - 15    Comment: Performed at [facility] [address], [place], [place] [address]  I-stat chem 8, ed     Status: Abnormal   Collection Time: 05/24/17  5:46 PM  Result Value Ref Range   Sodium 142 135 - 145 mmol/L   Potassium 3.0 (L) 3.5 - 5.1 mmol/L   Chloride 108 101 - 111 mmol/L   BUN 20 6 - 20 mg/dL   Creatinine, Ser 1.30 (H) 0.61 - 1.24 mg/dL   Glucose, Bld 98 65 - 99 mg/dL   Calcium, Ion 1.08 (L) 1.15 - 1.40 mmol/L   TCO2 18 (L) 22 - 32 mmol/L   Hemoglobin 16.0 13.0 - 17.0 g/dL   HCT 47.0 39.0 - 52.0 %  Type and screen [facility]     Status: None   Collection Time: 05/25/17  3:40 AM  Result Value Ref Range   ABO/RH(D) A POS    Antibody Screen  NEG    Sample Expiration      05/28/2017 Performed at [facility], [place] [address], [place], [place] [address]   ABO/Rh     Status: None (Preliminary result)   Collection  Time: 05/25/17  3:40 AM  Result Value Ref Range   ABO/RH(D)      A POS Performed at [facility], [place] [address], [place], [place] [address]   Basic metabolic panel     Status: Abnormal   Collection Time: 05/25/17  3:46 AM  Result Value Ref Range   Sodium 138 135 - 145 mmol/L   Potassium 4.7 3.5 - 5.1 mmol/L    Comment: DELTA CHECK NOTED   Chloride 111 101 - 111 mmol/L   CO2 17 (L) 22 - 32 mmol/L   Glucose, Bld 178 (H) 65 - 99 mg/dL   BUN 22 (H) 6 - 20 mg/dL   Creatinine, Ser 1.23 0.61 - 1.24 mg/dL   Calcium 8.6 (L) 8.9 - 10.3 mg/dL   GFR calc non Af Amer >60 >60 mL/min   GFR calc Af Amer >60 >60 mL/min    Comment: (NOTE) The eGFR has been calculated using the CKD EPI equation. This calculation has not been validated in all clinical situations. eGFR's persistently <60 mL/min signify possible Chronic Kidney Disease.    Anion gap 10 5 - 15    Comment: Performed at [facility] [address], [place] [address]  CBC     Status: None   Collection Time: 05/25/17  3:46 AM  Result Value Ref Range   WBC 8.3 4.0 - 10.5 K/uL   RBC 5.18 4.22 - 5.81 MIL/uL   Hemoglobin 15.3 13.0 - 17.0 g/dL   HCT 43.3 39.0 - 52.0 %   MCV 83.6 78.0 - 100.0 fL   MCH 29.5 26.0 - 34.0 pg   MCHC 35.3 30.0 - 36.0 g/dL   RDW 12.5 11.5 - 15.5 %   Platelets 185 150 - 400 K/uL    Comment: Performed at [facility], [place] [address], [place], [place] [address]    Ct Angio Head W Or Wo Contrast  Result Date: 05/24/2017 CLINICAL DATA:  Tingling and numbness in both lower extremities. Fall with neck injury in January. Suspected vertebral artery dissection seen on earlier MRI. EXAM: CT HEAD WITHOUT CONTRAST CT ANGIOGRAPHY OF THE NECK TECHNIQUE: Contiguous axial images were obtained from the base of the skull  through the vertex without intravenous contrast. Multidetector CT imaging of the neck was performed using the standard protocol during bolus administration of intravenous contrast. Multiplanar CT image reconstructions and MIPs were obtained to evaluate the vascular anatomy. Carotid stenosis measurements (when applicable) are obtained utilizing NASCET criteria, using the distal internal carotid diameter as the denominator. CONTRAST:  50 mL Isovue 370 COMPARISON:  CT head neck 01/29/2016 FINDINGS: CTA NECK FINDINGS Aortic arch: There is no calcific atherosclerosis of the aortic arch. There is no aneurysm, dissection or hemodynamically significant stenosis of the visualized ascending aorta and aortic arch. Conventional 3 vessel aortic branching pattern. The visualized proximal subclavian arteries are widely patent. Right carotid system: The right common carotid origin is widely patent. There is no common carotid or internal carotid artery dissection or aneurysm. No hemodynamically significant stenosis. Left carotid system: The left common carotid origin is widely patent. There is no common carotid or internal carotid artery dissection or aneurysm. No hemodynamically significant stenosis. Vertebral arteries: The vertebral system is left dominant. Both vertebral artery origins are normal. Both vertebral arteries are normal to their confluence with the basilar artery. The focal narrowing seen on the T2-weighted images of the earlier MRI is not confirmed here. Retrospectively, the right vertebral artery flow  void is normal on the traditional spin echo T2-weighted sequence from the MRI. It is only on the high-resolution CUBE sequence that it is abnormal. Skeleton: There is no bony spinal canal stenosis. No lytic or blastic lesions. Other neck: The nasopharynx is clear. The oropharynx and hypopharynx are normal. The epiglottis is normal. The supraglottic larynx, glottis and subglottic larynx are normal. No retropharyngeal  collection. The parapharyngeal spaces are preserved. The parotid and submandibular glands are normal. No sialolithiasis or salivary ductal dilatation. The thyroid gland is normal. There is no cervical lymphadenopathy. Upper chest: No pneumothorax or pleural effusion. No nodules or masses. Review of the MIP images confirms the above findings CTA HEAD FINDINGS Anterior circulation: --Intracranial internal carotid arteries: Normal. --Anterior cerebral arteries: Normal. --Middle cerebral arteries: Normal. --Posterior communicating arteries: Absent bilaterally. Posterior circulation: --Posterior cerebral arteries: Normal. --Superior cerebellar arteries: Normal. --Basilar artery: Normal. --Anterior inferior cerebellar arteries: Normal. --Posterior inferior cerebellar arteries: Normal. Venous sinuses: As permitted by contrast timing, patent. Anatomic variants: None Delayed phase: No parenchymal contrast enhancement. Review of the MIP images confirms the above findings. IMPRESSION: 1. Normal CTA of the head and neck. 2. The severe narrowing of the right vertebral artery flow void seen on the same day MRI is not confirmed on this study. Retrospectively, the right vertebral abnormality was present only on the T2-weighted CUBE sequence and not on the traditional T2-weighted sequence. Thus, it is probably artifactual. Given the combination of all these factors the right vertebral artery is considered normal without evidence of dissection. These results were called by telephone at the time of interpretation on 05/24/2017 at 9:05 pm to Dr. Noemi Chapel , who verbally acknowledged these results. Electronically Signed   By: Ulyses Jarred M.D.   On: 05/24/2017 21:05   Ct Angio Neck W And/or Wo Contrast  Result Date: 05/24/2017 CLINICAL DATA:  Tingling and numbness in both lower extremities. Fall with neck injury in January. Suspected vertebral artery dissection seen on earlier MRI. EXAM: CT HEAD WITHOUT CONTRAST CT ANGIOGRAPHY OF  THE NECK TECHNIQUE: Contiguous axial images were obtained from the base of the skull through the vertex without intravenous contrast. Multidetector CT imaging of the neck was performed using the standard protocol during bolus administration of intravenous contrast. Multiplanar CT image reconstructions and MIPs were obtained to evaluate the vascular anatomy. Carotid stenosis measurements (when applicable) are obtained utilizing NASCET criteria, using the distal internal carotid diameter as the denominator. CONTRAST:  50 mL Isovue 370 COMPARISON:  CT head neck 01/29/2016 FINDINGS: CTA NECK FINDINGS Aortic arch: There is no calcific atherosclerosis of the aortic arch. There is no aneurysm, dissection or hemodynamically significant stenosis of the visualized ascending aorta and aortic arch. Conventional 3 vessel aortic branching pattern. The visualized proximal subclavian arteries are widely patent. Right carotid system: The right common carotid origin is widely patent. There is no common carotid or internal carotid artery dissection or aneurysm. No hemodynamically significant stenosis. Left carotid system: The left common carotid origin is widely patent. There is no common carotid or internal carotid artery dissection or aneurysm. No hemodynamically significant stenosis. Vertebral arteries: The vertebral system is left dominant. Both vertebral artery origins are normal. Both vertebral arteries are normal to their confluence with the basilar artery. The focal narrowing seen on the T2-weighted images of the earlier MRI is not confirmed here. Retrospectively, the right vertebral artery flow void is normal on the traditional spin echo T2-weighted sequence from the MRI. It is only on the high-resolution CUBE sequence that  it is abnormal. Skeleton: There is no bony spinal canal stenosis. No lytic or blastic lesions. Other neck: The nasopharynx is clear. The oropharynx and hypopharynx are normal. The epiglottis is normal. The  supraglottic larynx, glottis and subglottic larynx are normal. No retropharyngeal collection. The parapharyngeal spaces are preserved. The parotid and submandibular glands are normal. No sialolithiasis or salivary ductal dilatation. The thyroid gland is normal. There is no cervical lymphadenopathy. Upper chest: No pneumothorax or pleural effusion. No nodules or masses. Review of the MIP images confirms the above findings CTA HEAD FINDINGS Anterior circulation: --Intracranial internal carotid arteries: Normal. --Anterior cerebral arteries: Normal. --Middle cerebral arteries: Normal. --Posterior communicating arteries: Absent bilaterally. Posterior circulation: --Posterior cerebral arteries: Normal. --Superior cerebellar arteries: Normal. --Basilar artery: Normal. --Anterior inferior cerebellar arteries: Normal. --Posterior inferior cerebellar arteries: Normal. Venous sinuses: As permitted by contrast timing, patent. Anatomic variants: None Delayed phase: No parenchymal contrast enhancement. Review of the MIP images confirms the above findings. IMPRESSION: 1. Normal CTA of the head and neck. 2. The severe narrowing of the right vertebral artery flow void seen on the same day MRI is not confirmed on this study. Retrospectively, the right vertebral abnormality was present only on the T2-weighted CUBE sequence and not on the traditional T2-weighted sequence. Thus, it is probably artifactual. Given the combination of all these factors the right vertebral artery is considered normal without evidence of dissection. These results were called by telephone at the time of interpretation on 05/24/2017 at 9:05 pm to Dr. Noemi Chapel , who verbally acknowledged these results. Electronically Signed   By: Ulyses Jarred M.D.   On: 05/24/2017 21:05   Mr Brain Wo Contrast  Result Date: 05/24/2017 CLINICAL DATA:  Acute neck pain with difficulty moving extremities. EXAM: MRI HEAD WITHOUT CONTRAST TECHNIQUE: Multiplanar, multiecho  pulse sequences of the brain and surrounding structures were obtained without intravenous contrast. COMPARISON:  None. FINDINGS: Brain: Partially empty sella. There is no acute infarct or acute hemorrhage. No mass lesion, hydrocephalus, dural abnormality or extra-axial collection. The brain parenchymal signal is normal for the patient's age. No age-advanced or lobar predominant atrophy. No chronic microhemorrhage or superficial siderosis. Vascular: Major intracranial arterial and venous sinus flow voids are preserved. Skull and upper cervical spine: The visualized skull base, calvarium, upper cervical spine and extracranial soft tissues are normal. Sinuses/Orbits: No fluid levels or advanced mucosal thickening. No mastoid or middle ear effusion. Normal orbits. IMPRESSION: Normal brain MRI. Electronically Signed   By: Ulyses Jarred M.D.   On: 05/24/2017 22:38   Mr Cervical Spine Wo Contrast  Result Date: 05/24/2017 CLINICAL DATA:  Cervical spine injury January 27. EXAM: MRI CERVICAL AND LUMBAR SPINE WITHOUT CONTRAST TECHNIQUE: Multiplanar and multiecho pulse sequences of the cervical spine, to include the craniocervical junction and cervicothoracic junction, and lumbar spine, were obtained without intravenous contrast. COMPARISON:  Cervical spine MRI 05/17/2017 FINDINGS: MRI CERVICAL SPINE FINDINGS Alignment: Physiologic. Vertebrae: Hyperintense T2-weighted signal focus at the C6 vertebral body with low T1-weighted signal, probably a fat poor hemangioma. No acute fracture. No discitis-osteomyelitis. Cord: Normal Posterior Fossa, vertebral arteries, paraspinal tissues: There is an acute dissection of the right vertebral artery. The artery is poorly visualized below the C4-5 level, but there is clearly abnormal T2-weighted signal surrounding and above this level, which is new compared to 05/17/2017. Disc levels: C1-C2: Normal. C2-C3: Normal disc space and facets. No spinal canal or neuroforaminal stenosis. C3-C4:  Small disc osteophyte complex with mild bilateral foraminal narrowing. C4-C5: Small disc osteophyte complex  with mild right and moderate left neural foraminal stenosis. C5-C6: Bilobed disc protrusion effaces the thecal sac and deforms the spinal cord, unchanged. No signal change within the spinal cord. There is severe spinal canal stenosis with mild right and severe left neural foraminal stenosis. C6-C7: Small disc osteophyte complex with mild spinal canal stenosis and mild right, moderate left foraminal stenosis. C7-T1: Normal disc space and facets. No spinal canal or neuroforaminal stenosis. MRI LUMBAR SPINE FINDINGS Segmentation:  Standard. Alignment:  Physiologic. Vertebrae:  No fracture, evidence of discitis, or bone lesion. Conus medullaris and cauda equina: Conus extends to the L2 level. Conus and cauda equina appear normal. Paraspinal and other soft tissues: The visualized vascular, retroperitoneal and paraspinal structures are normal. Disc levels: The T11-L4 levels are normal. L4-L5: Moderate facet hypertrophy with small central disc protrusion. No stenosis. L5-S1: Normal. IMPRESSION: 1. 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 The right vertebral artery origin and proximal V2 segment are incompletely visualized on this study. Brain MRI/MRA without contrast should be considered to assess for intracranial acute ischemia. CTA of the neck could provide further detail of the right vertebral artery, particularly its proximal portion, but this is not necessary for confirmation of the dissection. 2. 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 Severe left neural foraminal stenosis also at this level. 3. No acute abnormality or significant stenosis of the lumbar spine. These results were called by telephone at the time of interpretation on 05/24/2017 at 7:58 pm to Dr. Noemi Chapel , who verbally acknowledged these  results. Electronically Signed   By: Ulyses Jarred M.D.   On: 05/24/2017 20:05   Mr Lumbar Spine Wo Contrast  Result Date: 05/24/2017 CLINICAL DATA:  Cervical spine injury January 27. EXAM: MRI CERVICAL AND LUMBAR SPINE WITHOUT CONTRAST TECHNIQUE: Multiplanar and multiecho pulse sequences of the cervical spine, to include the craniocervical junction and cervicothoracic junction, and lumbar spine, were obtained without intravenous contrast. COMPARISON:  Cervical spine MRI 05/17/2017 FINDINGS: MRI CERVICAL SPINE FINDINGS Alignment: Physiologic. Vertebrae: Hyperintense T2-weighted signal focus at the C6 vertebral body with low T1-weighted signal, probably a fat poor hemangioma. No acute fracture. No discitis-osteomyelitis. Cord: Normal Posterior Fossa, vertebral arteries, paraspinal tissues: There is an acute dissection of the right vertebral artery. The artery is poorly visualized below the C4-5 level, but there is clearly abnormal T2-weighted signal surrounding and above this level, which is new compared to 05/17/2017. Disc levels: C1-C2: Normal. C2-C3: Normal disc space and facets. No spinal canal or neuroforaminal stenosis. C3-C4: Small disc osteophyte complex with mild bilateral foraminal narrowing. C4-C5: Small disc osteophyte complex with mild right and moderate left neural foraminal stenosis. C5-C6: Bilobed disc protrusion effaces the thecal sac and deforms the spinal cord, unchanged. No signal change within the spinal cord. There is severe spinal canal stenosis with mild right and severe left neural foraminal stenosis. C6-C7: Small disc osteophyte complex with mild spinal canal stenosis and mild right, moderate left foraminal stenosis. C7-T1: Normal disc space and facets. No spinal canal or neuroforaminal stenosis. MRI LUMBAR SPINE FINDINGS Segmentation:  Standard. Alignment:  Physiologic. Vertebrae:  No fracture, evidence of discitis, or bone lesion. Conus medullaris and cauda equina: Conus extends to the  L2 level. Conus and cauda equina appear normal. Paraspinal and other soft tissues: The visualized vascular, retroperitoneal and paraspinal structures are normal. Disc levels: The T11-L4 levels are normal. L4-L5: Moderate facet hypertrophy with small central  disc protrusion. No stenosis. L5-S1: Normal. IMPRESSION: 1. 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 The right vertebral artery origin and proximal V2 segment are incompletely visualized on this study. Brain MRI/MRA without contrast should be considered to assess for intracranial acute ischemia. CTA of the neck could provide further detail of the right vertebral artery, particularly its proximal portion, but this is not necessary for confirmation of the dissection. 2. 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 Severe left neural foraminal stenosis also at this level. 3. No acute abnormality or significant stenosis of the lumbar spine. These results were called by telephone at the time of interpretation on 05/24/2017 at 7:58 pm to Dr. Noemi Chapel , who verbally acknowledged these results. Electronically Signed   By: Ulyses Jarred M.D.   On: 05/24/2017 20:05    Review of Systems  Constitutional: Positive for diaphoresis.  Musculoskeletal: Positive for back pain and neck pain.   Blood pressure 113/85, pulse (!) 101, temperature 98.2 F (36.8 C), temperature source Oral, resp. rate 20, height 5' 11"  (1.803 m), weight 195 lb (88.5 kg), SpO2 98 %. Physical Exam  Constitutional: He appears well-developed. He appears distressed.  HENT:  Head: Normocephalic and atraumatic.  Neurological: He is alert.  The patient is noted to have a positive Hoffmann sign bilaterally.  He is hyperreflexic at his bilateral upper and lower extremities.  No focal strength deficits are noted, however, substantial pain is identified in the low back and right and left hips on  resisted manual muscle testing of his bilateral lower extremities.  Diffuse tenderness is noted to palpation involving the cervical, thoracic, and lumbar regions.  Skin: Skin is warm.   The patient was able to stand and walk to a chair prior to my evaluation with him.  I was able to stand him up for approximately 60 seconds or so during my evaluation with him as well.  He did stand independently, but was very unsteady.  Of note, at the time of my evaluation with the patient, he did state that he felt like he was going to pass out.  Shortly after this, his pupils dilated and he was unresponsive with his eyes open for approximately 1 minute.  His nurse Darnelle Bos was in the room during this event.  After about 1 minute, he became responsive.  His vital signs were stable when he became responsive again.  He did become hypertensive when he was unresponsive.   Assessment/Plan: The patient's situation clearly is a rather complex one.  He clearly does have signs and symptoms and MRI findings associated with cervical myelopathy and left-sided cervical radiculopathy.  However, that specific diagnosis does not explain his entire constellation of symptoms.  I do feel that he will ultimately need right and ACDF procedure at C5-6 and C6-7, however, given his unresponsiveness, and substantial anxiety that he reports, and various muscular skeletal pains that I cannot explain it based on his MRI, I am recommending an additional evaluation by neurology, and an evaluation by psychiatry.  Again, I do feel that at some point he is going to need an ACDF procedure, given his spinal cord compression, but I do feel that he does presently also have other medical and potentially psychiatric conditions that need to be evaluated and treated prior to him having an ACDF procedure.  Again, given his unresponsiveness that was clearly noted at the time of my evaluation with  him, in combination with the fact that his pupils did certainly  dilate during that time, neurology does need to reevaluate the patient as soon as is feasible.  Once his medical and potentially psychiatric conditions are more thoroughly assessed and treated, we can potentially proceed with an ACDF procedure.  This is likely best addressed on an outpatient basis once his more acute medical conditions are able to be addressed.  Zachary Nash 05/25/2017, 8:21 AM

## 2017-05-25 NOTE — Progress Notes (Signed)
Orthopedic surgeon in room with patient when patient stated he wants a cold wash cloth and that he felt he was going to pass out. Nurse the cold wash cloth to patient's forehead and seconds later, patient became unresponsive, starring forward. This lasted for approximately 72mn, 30sec. Patient called out what just happened and stated this is what happened yesterday. Vital signs taken and recorded. Tentative surgery for this afternoon is pending per surgeon. Will continue to monitor.

## 2017-05-25 NOTE — ED Notes (Signed)
Attempted report x1. 

## 2017-05-25 NOTE — Care Management Note (Signed)
Case Management Note  Patient Details  Name: Zachary Nash MRN: [id number] Date of Birth: 04-25-1970  Subjective/Objective:        Severe stenosis of cervical spine, left sided numbness, pain            Action/Plan: NCM spoke to pt and parents at bedside. Pt states he lives at home alone but his parents are available to assist as needed. Pt states he does not have any DME at home. Waiting PT/OT evaluation. Will continue to follow for dc needs.    Expected Discharge Date:  05/26/17               Expected Discharge Plan:  Home/Self Care  In-House Referral:  NA  Discharge planning Services  CM Consult  Post Acute Care Choice:    Choice offered to:     DME Arranged:    DME Agency:     HH Arranged:    HH Agency:     Status of Service:  In process, will continue to follow  If discussed at Long Length of Stay Meetings, dates discussed:    Additional Comments:  Erenest Rasher, RN 05/25/2017, 3:05 PM

## 2017-05-25 NOTE — Progress Notes (Addendum)
   Follow Up Note  HPI: Please refer to H&P dated earlier this am for more details  Briefly pt with a hx of severe stenosis of cervical spine found after an accident at work on 04/25/17, presents to ED after sudden onset of tingling and pain sensations to his neck. Pt reported decreased sensation to his entire body with inability to move his limbs. Images done showed no other acute findings for his new symptoms other than severe stenosis of cervical spine. Orthopedic, neurology and vascular were consulted.  Today, while Dr Lynann Bologna from ortho was seeing pt, pt was noted to be unresponsive with eyes open. Pt pupils noted to be dilated. Episode lasted for about 1 min. During my encounter with pt, pt was responsive the entire conversation and oriented. Pt reported pain in the low back, tenderness around cervical/thoracic and lumbar spinal region. Denies any headache, N/V, chest pan, SOB, fever/chills. Overall, reports weakness has improved  Exam: CV: S1, S2 present Lungs: CTA Abd: soft, NT, ND, BS+ Ext: No pedal edema b/l Neuro: Pt in c-collar, no significant focal strength deficit, tenderness to lower back, unsteady gait  Present on Admission: . Left sided numbness . Radicular pain of left lower extremity   Disposition: Due to brief LOC, spoke to neurology to re-evaluate pt, EEG ordered PT/OT Orthopedics to plan on ACDF procedure, will keep NPO just incase of any emergent surgical deterioration   Spoke with Dr Lynann Bologna from ortho who wanted medical clearance from both medicine service and neurology before he proceeds for surgery. An MRI brain was done, with no acute abnormalities, EKG with SR. Ordered a stat ECHO and a baseline troponin, if all normal, pt is ok to go for spinal surgery. Neurology attending to please comment on EEG and advise on clearance for spinal surgery

## 2017-05-25 NOTE — Progress Notes (Signed)
Subjective: Today, while Dr Lynann Bologna from orthopedics was seeing the patient, the patient was noted to be unresponsive with eyes open, pupils dilated. The episode lasted for about 1 minute. Seizure was suspected and an EEG was obtained, revealing no epileptiform discharges or electrographic seizures; very mild generalized slowing was noted.   Objective: Current vital signs: BP 119/83   Pulse 80   Temp 97.8 F (36.6 C) (Oral)   Resp 16   Ht _0  (1.803 m)   Wt 88.5 kg (195 lb)   SpO2 98%   BMI 27.20 kg/m  Vital signs in last 24 hours: Temp:  [97.6 F (36.4 C)-98.2 F (36.8 C)] 97.8 F (36.6 C) (02/26 2022) Pulse Rate:  [61-113] 80 (02/26 1630) Resp:  [16-23] 16 (02/26 1600) BP: (90-158)/(53-146) 119/83 (02/26 1630) SpO2:  [91 %-100 %] 98 % (02/26 1630)  Intake/Output from previous day: 02/25 0701 - 02/26 0700 In: 317.5 [I.V.:167.5; IV Piggyback:150] Out: -  Intake/Output this shift: Total I/O In: -  Out: 350 [Urine:350] Nutritional status: Orthopedic spinal precautions Diet regular Room service appropriate? Yes; Fluid consistency: Thin Diet NPO time specified  Neurologic Exam: Ment: Awake and oriented. Speech fluent with intact comprehension. Normal insight.  CN: No focal abnormality. PERRL. No facial droop.  Motor: Improved but still with decreased motor strength all 4 extremities.   Lab Results: Results for orders placed or performed during the hospital encounter of 05/24/17 (from the past 48 hour(s))  CBC with Differential/Platelet     Status: Abnormal   Collection Time: 05/24/17  5:40 PM  Result Value Ref Range   WBC 6.9 4.0 - 10.5 K/uL   RBC 5.39 4.22 - 5.81 MIL/uL   Hemoglobin 16.4 13.0 - 17.0 g/dL   HCT 44.7 39.0 - 52.0 %   MCV 82.9 78.0 - 100.0 fL   MCH 30.4 26.0 - 34.0 pg   MCHC 36.7 (H) 30.0 - 36.0 g/dL   RDW 12.9 11.5 - 15.5 %   Platelets 218 150 - 400 K/uL   Neutrophils Relative % 57 %   Neutro Abs 3.9 1.7 - 7.7 K/uL   Lymphocytes Relative 32 %    Lymphs Abs 2.2 0.7 - 4.0 K/uL   Monocytes Relative 8 %   Monocytes Absolute 0.6 0.1 - 1.0 K/uL   Eosinophils Relative 3 %   Eosinophils Absolute 0.2 0.0 - 0.7 K/uL   Basophils Relative 0 %   Basophils Absolute 0.0 0.0 - 0.1 K/uL    Comment: Performed at [facility], [address] [place], [place] [address]  Comprehensive metabolic panel     Status: Abnormal   Collection Time: 05/24/17  5:40 PM  Result Value Ref Range   Sodium 139 135 - 145 mmol/L   Potassium 3.1 (L) 3.5 - 5.1 mmol/L   Chloride 109 101 - 111 mmol/L   CO2 19 (L) 22 - 32 mmol/L   Glucose, Bld 105 (H) 65 - 99 mg/dL   BUN 19 6 - 20 mg/dL   Creatinine, Ser 1.36 (H) 0.61 - 1.24 mg/dL   Calcium 9.2 8.9 - 10.3 mg/dL   Total Protein 6.9 6.5 - 8.1 g/dL   Albumin 4.1 3.5 - 5.0 g/dL   AST 33 15 - 41 U/L   ALT 39 17 - 63 U/L   Alkaline Phosphatase 65 38 - 126 U/L   Total Bilirubin 0.6 0.3 - 1.2 mg/dL   GFR calc non Af Amer >60 >60 mL/min   GFR calc Af Amer >  60 >60 mL/min    Comment: (NOTE) The eGFR has been calculated using the CKD EPI equation. This calculation has not been validated in all clinical situations. eGFR's persistently <60 mL/min signify possible Chronic Kidney Disease.    Anion gap 11 5 - 15    Comment: Performed at [facility] [address], [place], [place] [address]  I-stat chem 8, ed     Status: Abnormal   Collection Time: 05/24/17  5:46 PM  Result Value Ref Range   Sodium 142 135 - 145 mmol/L   Potassium 3.0 (L) 3.5 - 5.1 mmol/L   Chloride 108 101 - 111 mmol/L   BUN 20 6 - 20 mg/dL   Creatinine, Ser 1.30 (H) 0.61 - 1.24 mg/dL   Glucose, Bld 98 65 - 99 mg/dL   Calcium, Ion 1.08 (L) 1.15 - 1.40 mmol/L   TCO2 18 (L) 22 - 32 mmol/L   Hemoglobin 16.0 13.0 - 17.0 g/dL   HCT 47.0 39.0 - 52.0 %  Type and screen [place]     Status: None   Collection Time: 05/25/17  3:40 AM  Result Value Ref Range   ABO/RH(D) A POS    Antibody Screen NEG    Sample Expiration       05/28/2017 Performed at [facility], [place] [address] [place], [place] [address]   ABO/Rh     Status: None   Collection Time: 05/25/17  3:40 AM  Result Value Ref Range   ABO/RH(D)      A POS Performed at [facility] [address] [place], [place] [address]   HIV antibody (Routine Testing)     Status: None   Collection Time: 05/25/17  3:46 AM  Result Value Ref Range   HIV Screen 4th Generation wRfx Non Reactive Non Reactive    Comment: (NOTE) Performed At: [facility] [address] [place], [name] [id number] Rush Farmer MD [id number] Performed at [facility], [place] [address], [place], [place] [address]   Basic metabolic panel     Status: Abnormal   Collection Time: 05/25/17  3:46 AM  Result Value Ref Range   Sodium 138 135 - 145 mmol/L   Potassium 4.7 3.5 - 5.1 mmol/L    Comment: DELTA CHECK NOTED   Chloride 111 101 - 111 mmol/L   CO2 17 (L) 22 - 32 mmol/L   Glucose, Bld 178 (H) 65 - 99 mg/dL   BUN 22 (H) 6 - 20 mg/dL   Creatinine, Ser 1.23 0.61 - 1.24 mg/dL   Calcium 8.6 (L) 8.9 - 10.3 mg/dL   GFR calc non Af Amer >60 >60 mL/min   GFR calc Af Amer >60 >60 mL/min    Comment: (NOTE) The eGFR has been calculated using the CKD EPI equation. This calculation has not been validated in all clinical situations. eGFR's persistently <60 mL/min signify possible Chronic Kidney Disease.    Anion gap 10 5 - 15    Comment: Performed at [facility] [address] [place] [address]  CBC     Status: None   Collection Time: 05/25/17  3:46 AM  Result Value Ref Range   WBC 8.3 4.0 - 10.5 K/uL   RBC 5.18 4.22 - 5.81 MIL/uL   Hemoglobin 15.3 13.0 - 17.0 g/dL   HCT 43.3 39.0 - 52.0 %   MCV 83.6 78.0 - 100.0 fL   MCH 29.5 26.0 - 34.0 pg  MCHC 35.3 30.0 - 36.0 g/dL   RDW 12.5 11.5 - 15.5 %   Platelets 185 150 - 400 K/uL    Comment: Performed at [facility], [place] [address] [place], [place] [address]  MRSA PCR Screening      Status: None   Collection Time: 05/25/17  5:09 AM  Result Value Ref Range   MRSA by PCR NEGATIVE NEGATIVE    Comment:        The GeneXpert MRSA Assay (FDA approved for NASAL specimens only), is one component of a comprehensive MRSA colonization surveillance program. It is not intended to diagnose MRSA infection nor to guide or monitor treatment for MRSA infections. Performed at [facility], [place] [address], [place], [place] [address]   Glucose, capillary     Status: Abnormal   Collection Time: 05/25/17  7:17 AM  Result Value Ref Range   Glucose-Capillary 147 (H) 65 - 99 mg/dL   Comment 1 Notify RN    Comment 2 Document in Chart   Glucose, capillary     Status: None   Collection Time: 05/25/17  4:48 PM  Result Value Ref Range   Glucose-Capillary 95 65 - 99 mg/dL   Comment 1 Notify RN    Comment 2 Document in Chart     Recent Results (from the past 240 hour(s))  MRSA PCR Screening     Status: None   Collection Time: 05/25/17  5:09 AM  Result Value Ref Range Status   MRSA by PCR NEGATIVE NEGATIVE Final    Comment:        The GeneXpert MRSA Assay (FDA approved for NASAL specimens only), is one component of a comprehensive MRSA colonization surveillance program. It is not intended to diagnose MRSA infection nor to guide or monitor treatment for MRSA infections. Performed at [facility], [place] [address], [place], [place] [address]     Lipid Panel No results for input(s): CHOL, TRIG, HDL, CHOLHDL, VLDL, LDLCALC in the last 72 hours.  Studies/Results: Ct Angio Head W Or Wo Contrast  Result Date: 05/24/2017 CLINICAL DATA:  Tingling and numbness in both lower extremities. Fall with neck injury in January. Suspected vertebral artery dissection seen on earlier MRI. EXAM: CT HEAD WITHOUT CONTRAST CT ANGIOGRAPHY OF THE NECK TECHNIQUE: Contiguous axial images were obtained from the base of the skull through the vertex without intravenous contrast. Multidetector  CT imaging of the neck was performed using the standard protocol during bolus administration of intravenous contrast. Multiplanar CT image reconstructions and MIPs were obtained to evaluate the vascular anatomy. Carotid stenosis measurements (when applicable) are obtained utilizing NASCET criteria, using the distal internal carotid diameter as the denominator. CONTRAST:  50 mL Isovue 370 COMPARISON:  CT head neck 01/29/2016 FINDINGS: CTA NECK FINDINGS Aortic arch: There is no calcific atherosclerosis of the aortic arch. There is no aneurysm, dissection or hemodynamically significant stenosis of the visualized ascending aorta and aortic arch. Conventional 3 vessel aortic branching pattern. The visualized proximal subclavian arteries are widely patent. Right carotid system: The right common carotid origin is widely patent. There is no common carotid or internal carotid artery dissection or aneurysm. No hemodynamically significant stenosis. Left carotid system: The left common carotid origin is widely patent. There is no common carotid or internal carotid artery dissection or aneurysm. No hemodynamically significant stenosis. Vertebral arteries: The vertebral system is left dominant. Both vertebral artery origins are normal. Both vertebral arteries are normal to their confluence with the basilar  artery. The focal narrowing seen on the T2-weighted images of the earlier MRI is not confirmed here. Retrospectively, the right vertebral artery flow void is normal on the traditional spin echo T2-weighted sequence from the MRI. It is only on the high-resolution CUBE sequence that it is abnormal. Skeleton: There is no bony spinal canal stenosis. No lytic or blastic lesions. Other neck: The nasopharynx is clear. The oropharynx and hypopharynx are normal. The epiglottis is normal. The supraglottic larynx, glottis and subglottic larynx are normal. No retropharyngeal collection. The parapharyngeal spaces are preserved. The parotid  and submandibular glands are normal. No sialolithiasis or salivary ductal dilatation. The thyroid gland is normal. There is no cervical lymphadenopathy. Upper chest: No pneumothorax or pleural effusion. No nodules or masses. Review of the MIP images confirms the above findings CTA HEAD FINDINGS Anterior circulation: --Intracranial internal carotid arteries: Normal. --Anterior cerebral arteries: Normal. --Middle cerebral arteries: Normal. --Posterior communicating arteries: Absent bilaterally. Posterior circulation: --Posterior cerebral arteries: Normal. --Superior cerebellar arteries: Normal. --Basilar artery: Normal. --Anterior inferior cerebellar arteries: Normal. --Posterior inferior cerebellar arteries: Normal. Venous sinuses: As permitted by contrast timing, patent. Anatomic variants: None Delayed phase: No parenchymal contrast enhancement. Review of the MIP images confirms the above findings. IMPRESSION: 1. Normal CTA of the head and neck. 2. The severe narrowing of the right vertebral artery flow void seen on the same day MRI is not confirmed on this study. Retrospectively, the right vertebral abnormality was present only on the T2-weighted CUBE sequence and not on the traditional T2-weighted sequence. Thus, it is probably artifactual. Given the combination of all these factors the right vertebral artery is considered normal without evidence of dissection. These results were called by telephone at the time of interpretation on 05/24/2017 at 9:05 pm to Dr. Noemi Chapel , who verbally acknowledged these results. Electronically Signed   By: Ulyses Jarred M.D.   On: 05/24/2017 21:05   Ct Angio Neck W And/or Wo Contrast  Result Date: 05/24/2017 CLINICAL DATA:  Tingling and numbness in both lower extremities. Fall with neck injury in January. Suspected vertebral artery dissection seen on earlier MRI. EXAM: CT HEAD WITHOUT CONTRAST CT ANGIOGRAPHY OF THE NECK TECHNIQUE: Contiguous axial images were obtained from  the base of the skull through the vertex without intravenous contrast. Multidetector CT imaging of the neck was performed using the standard protocol during bolus administration of intravenous contrast. Multiplanar CT image reconstructions and MIPs were obtained to evaluate the vascular anatomy. Carotid stenosis measurements (when applicable) are obtained utilizing NASCET criteria, using the distal internal carotid diameter as the denominator. CONTRAST:  50 mL Isovue 370 COMPARISON:  CT head neck 01/29/2016 FINDINGS: CTA NECK FINDINGS Aortic arch: There is no calcific atherosclerosis of the aortic arch. There is no aneurysm, dissection or hemodynamically significant stenosis of the visualized ascending aorta and aortic arch. Conventional 3 vessel aortic branching pattern. The visualized proximal subclavian arteries are widely patent. Right carotid system: The right common carotid origin is widely patent. There is no common carotid or internal carotid artery dissection or aneurysm. No hemodynamically significant stenosis. Left carotid system: The left common carotid origin is widely patent. There is no common carotid or internal carotid artery dissection or aneurysm. No hemodynamically significant stenosis. Vertebral arteries: The vertebral system is left dominant. Both vertebral artery origins are normal. Both vertebral arteries are normal to their confluence with the basilar artery. The focal narrowing seen on the T2-weighted images of the earlier MRI is not confirmed here. Retrospectively, the right vertebral artery  flow void is normal on the traditional spin echo T2-weighted sequence from the MRI. It is only on the high-resolution CUBE sequence that it is abnormal. Skeleton: There is no bony spinal canal stenosis. No lytic or blastic lesions. Other neck: The nasopharynx is clear. The oropharynx and hypopharynx are normal. The epiglottis is normal. The supraglottic larynx, glottis and subglottic larynx are normal.  No retropharyngeal collection. The parapharyngeal spaces are preserved. The parotid and submandibular glands are normal. No sialolithiasis or salivary ductal dilatation. The thyroid gland is normal. There is no cervical lymphadenopathy. Upper chest: No pneumothorax or pleural effusion. No nodules or masses. Review of the MIP images confirms the above findings CTA HEAD FINDINGS Anterior circulation: --Intracranial internal carotid arteries: Normal. --Anterior cerebral arteries: Normal. --Middle cerebral arteries: Normal. --Posterior communicating arteries: Absent bilaterally. Posterior circulation: --Posterior cerebral arteries: Normal. --Superior cerebellar arteries: Normal. --Basilar artery: Normal. --Anterior inferior cerebellar arteries: Normal. --Posterior inferior cerebellar arteries: Normal. Venous sinuses: As permitted by contrast timing, patent. Anatomic variants: None Delayed phase: No parenchymal contrast enhancement. Review of the MIP images confirms the above findings. IMPRESSION: 1. Normal CTA of the head and neck. 2. The severe narrowing of the right vertebral artery flow void seen on the same day MRI is not confirmed on this study. Retrospectively, the right vertebral abnormality was present only on the T2-weighted CUBE sequence and not on the traditional T2-weighted sequence. Thus, it is probably artifactual. Given the combination of all these factors the right vertebral artery is considered normal without evidence of dissection. These results were called by telephone at the time of interpretation on 05/24/2017 at 9:05 pm to Dr. Noemi Chapel , who verbally acknowledged these results. Electronically Signed   By: Ulyses Jarred M.D.   On: 05/24/2017 21:05   Mr Brain Wo Contrast  Result Date: 05/24/2017 CLINICAL DATA:  Acute neck pain with difficulty moving extremities. EXAM: MRI HEAD WITHOUT CONTRAST TECHNIQUE: Multiplanar, multiecho pulse sequences of the brain and surrounding structures were  obtained without intravenous contrast. COMPARISON:  None. FINDINGS: Brain: Partially empty sella. There is no acute infarct or acute hemorrhage. No mass lesion, hydrocephalus, dural abnormality or extra-axial collection. The brain parenchymal signal is normal for the patient's age. No age-advanced or lobar predominant atrophy. No chronic microhemorrhage or superficial siderosis. Vascular: Major intracranial arterial and venous sinus flow voids are preserved. Skull and upper cervical spine: The visualized skull base, calvarium, upper cervical spine and extracranial soft tissues are normal. Sinuses/Orbits: No fluid levels or advanced mucosal thickening. No mastoid or middle ear effusion. Normal orbits. IMPRESSION: Normal brain MRI. Electronically Signed   By: Ulyses Jarred M.D.   On: 05/24/2017 22:38   Mr Cervical Spine Wo Contrast  Result Date: 05/24/2017 CLINICAL DATA:  Cervical spine injury January 27. EXAM: MRI CERVICAL AND LUMBAR SPINE WITHOUT CONTRAST TECHNIQUE: Multiplanar and multiecho pulse sequences of the cervical spine, to include the craniocervical junction and cervicothoracic junction, and lumbar spine, were obtained without intravenous contrast. COMPARISON:  Cervical spine MRI 05/17/2017 FINDINGS: MRI CERVICAL SPINE FINDINGS Alignment: Physiologic. Vertebrae: Hyperintense T2-weighted signal focus at the C6 vertebral body with low T1-weighted signal, probably a fat poor hemangioma. No acute fracture. No discitis-osteomyelitis. Cord: Normal Posterior Fossa, vertebral arteries, paraspinal tissues: There is an acute dissection of the right vertebral artery. The artery is poorly visualized below the C4-5 level, but there is clearly abnormal T2-weighted signal surrounding and above this level, which is new compared to 05/17/2017. Disc levels: C1-C2: Normal. C2-C3: Normal disc space and  facets. No spinal canal or neuroforaminal stenosis. C3-C4: Small disc osteophyte complex with mild bilateral foraminal  narrowing. C4-C5: Small disc osteophyte complex with mild right and moderate left neural foraminal stenosis. C5-C6: Bilobed disc protrusion effaces the thecal sac and deforms the spinal cord, unchanged. No signal change within the spinal cord. There is severe spinal canal stenosis with mild right and severe left neural foraminal stenosis. C6-C7: Small disc osteophyte complex with mild spinal canal stenosis and mild right, moderate left foraminal stenosis. C7-T1: Normal disc space and facets. No spinal canal or neuroforaminal stenosis. MRI LUMBAR SPINE FINDINGS Segmentation:  Standard. Alignment:  Physiologic. Vertebrae:  No fracture, evidence of discitis, or bone lesion. Conus medullaris and cauda equina: Conus extends to the L2 level. Conus and cauda equina appear normal. Paraspinal and other soft tissues: The visualized vascular, retroperitoneal and paraspinal structures are normal. Disc levels: The T11-L4 levels are normal. L4-L5: Moderate facet hypertrophy with small central disc protrusion. No stenosis. L5-S1: Normal. IMPRESSION: 1. 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 The right vertebral artery origin and proximal V2 segment are incompletely visualized on this study. Brain MRI/MRA without contrast should be considered to assess for intracranial acute ischemia. CTA of the neck could provide further detail of the right vertebral artery, particularly its proximal portion, but this is not necessary for confirmation of the dissection. 2. 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 Severe left neural foraminal stenosis also at this level. 3. No acute abnormality or significant stenosis of the lumbar spine. These results were called by telephone at the time of interpretation on 05/24/2017 at 7:58 pm to Dr. Noemi Chapel , who verbally acknowledged these results. Electronically Signed   By: Ulyses Jarred M.D.   On:  05/24/2017 20:05   Mr Lumbar Spine Wo Contrast  Result Date: 05/24/2017 CLINICAL DATA:  Cervical spine injury January 27. EXAM: MRI CERVICAL AND LUMBAR SPINE WITHOUT CONTRAST TECHNIQUE: Multiplanar and multiecho pulse sequences of the cervical spine, to include the craniocervical junction and cervicothoracic junction, and lumbar spine, were obtained without intravenous contrast. COMPARISON:  Cervical spine MRI 05/17/2017 FINDINGS: MRI CERVICAL SPINE FINDINGS Alignment: Physiologic. Vertebrae: Hyperintense T2-weighted signal focus at the C6 vertebral body with low T1-weighted signal, probably a fat poor hemangioma. No acute fracture. No discitis-osteomyelitis. Cord: Normal Posterior Fossa, vertebral arteries, paraspinal tissues: There is an acute dissection of the right vertebral artery. The artery is poorly visualized below the C4-5 level, but there is clearly abnormal T2-weighted signal surrounding and above this level, which is new compared to 05/17/2017. Disc levels: C1-C2: Normal. C2-C3: Normal disc space and facets. No spinal canal or neuroforaminal stenosis. C3-C4: Small disc osteophyte complex with mild bilateral foraminal narrowing. C4-C5: Small disc osteophyte complex with mild right and moderate left neural foraminal stenosis. C5-C6: Bilobed disc protrusion effaces the thecal sac and deforms the spinal cord, unchanged. No signal change within the spinal cord. There is severe spinal canal stenosis with mild right and severe left neural foraminal stenosis. C6-C7: Small disc osteophyte complex with mild spinal canal stenosis and mild right, moderate left foraminal stenosis. C7-T1: Normal disc space and facets. No spinal canal or neuroforaminal stenosis. MRI LUMBAR SPINE FINDINGS Segmentation:  Standard. Alignment:  Physiologic. Vertebrae:  No fracture, evidence of discitis, or bone lesion. Conus medullaris and cauda equina: Conus extends to the L2 level. Conus and cauda equina appear normal. Paraspinal  and other soft tissues:  The visualized vascular, retroperitoneal and paraspinal structures are normal. Disc levels: The T11-L4 levels are normal. L4-L5: Moderate facet hypertrophy with small central disc protrusion. No stenosis. L5-S1: Normal. IMPRESSION: 1. 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 The right vertebral artery origin and proximal V2 segment are incompletely visualized on this study. Brain MRI/MRA without contrast should be considered to assess for intracranial acute ischemia. CTA of the neck could provide further detail of the right vertebral artery, particularly its proximal portion, but this is not necessary for confirmation of the dissection. 2. 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 Severe left neural foraminal stenosis also at this level. 3. No acute abnormality or significant stenosis of the lumbar spine. These results were called by telephone at the time of interpretation on 05/24/2017 at 7:58 pm to Dr. Noemi Chapel , who verbally acknowledged these results. Electronically Signed   By: Ulyses Jarred M.D.   On: 05/24/2017 20:05    Medications:  Scheduled: . famotidine (PEPCID) IV  20 mg Intravenous Q12H   Continuous: . sodium chloride 75 mL/hr at 05/25/17 1515  . methocarbamol (ROBAXIN)  IV Stopped (05/25/17 1505)  . potassium chloride Stopped (05/24/17 2048)   CHJ:[id number] **OR** acetaminophen, methocarbamol (ROBAXIN)  IV, morphine injection, ondansetron **OR** ondansetron (ZOFRAN) IV  Assessment/Plan:  47 y.o. male with acute inability to walk, bilateral forearm/hand numbness and paresthesias, bilateral lower extremity numbness and paresthesia in the context of cervical spine injury on 1/27 and recent diagnosis of cervical myelopathy based upon C-spine MRI.   1. New spell of decreased responsiveness today. EEG normal. During the spell he states that he had preserved  consciousness but just could not speak or move. No jerking/twitching documented and patient also states no such activity with the spell. Overall would be atypical presentation for seizure. Stress-related/psychogenic symptoms are felt to be more likely. From a Neurological standpoint there is no indication for starting an anticonvulsant at this time.  2. No specific findings on testing or exam to suggest a significant increased risk from a neurological standpoint of undergoing surgery. Benefits of surgery are felt to outweigh risks.  3. Obtain magnesium level and repeat Ca level prior to surgery and replenish if indicated.     LOS: 0 days   _0  signed: Dr. Kerney Elbe 05/25/2017  8:23 PM

## 2017-05-25 NOTE — ED Notes (Signed)
Dr. Hal Hope at bedside, awaiting admit orders.

## 2017-05-25 NOTE — H&P (Signed)
History and Physical    EDRAS WILFORD QBH:[id number] DOB: 1970/07/13 DOA: 05/24/2017  PCP: Celene Squibb, MD  Patient coming from: Home.  Chief Complaint: Weakness of extremities and numbness.  HPI: Zachary Nash is a 47 y.o. male with history of GERD who had recent injury during work on April 25, 2017 while pulling backwards on a truck release had subsequently followed up with orthopedic surgeon and had MRI done on May 17, 2017 which showed severe stenosis of the cervical spine and had followed up with Dr. Lynann Bologna on May 21, 2017 and plan to have 2 level ACDF started experiencing weakness of the all extremities with numbness and neck and lower extremity pain yesterday after getting up from the commode at around 1 PM.  Patient was unable to move from his recliner.  Denies any incontinence of urine or bowel.    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  Which is unchanged from the previous MRI.  Neurologist, vascular surgeon and orthopedic surgeons were consulted.  MRI of the brain, CT angiogram of the head and neck were done which were did not show anything acute.  At this time patient is being admitted for weakness likely from his C5-C6 stenosis.  Review of Systems: As per HPI, rest all negative.   Past Medical History:  Diagnosis Date  . Anxiety   . Asthma   . Bilateral inguinal hernia 05/29/2016  . Complication of anesthesia    states he stopped breathing one time with anesthesia  . Constipation   . Fatty liver   . GERD (gastroesophageal reflux disease)   . History of kidney stones   . Pneumonia   . PONV (postoperative nausea and vomiting)   . Radicular pain of left lower extremity     Past Surgical History:  Procedure Laterality Date  . CHOLECYSTECTOMY    . ESOPHAGEAL DILATION N/A 03/24/2017   Procedure:  ESOPHAGEAL DILATION;  Surgeon: Rogene Houston, MD;  Location: [facility];  Service: Endoscopy;  Laterality: N/A;  . ESOPHAGOGASTRODUODENOSCOPY N/A 03/24/2017   Procedure: ESOPHAGOGASTRODUODENOSCOPY (EGD);  Surgeon: Rogene Houston, MD;  Location: [facility];  Service: Endoscopy;  Laterality: N/A;  1155  . INGUINAL HERNIA REPAIR Bilateral 05/29/2016   Procedure: OPEN HERNIA REPAIR INGUINAL ADULT BILATERAL WITH insertion of MESH;  Surgeon: Fanny Skates, MD;  Location: [facility];  Service: General;  Laterality: Bilateral;  . Lipoma removed       reports that  has never smoked. he has never used smokeless tobacco. He reports that he does not drink alcohol or use drugs.  Allergies  Allergen Reactions  . Penicillins Anaphylaxis and Swelling    Has patient had a PCN reaction causing immediate rash, facial/tongue/throat swelling, SOB or lightheadedness with hypotension: yes Has patient had a PCN reaction causing severe rash involving mucus membranes or skin necrosis: yes Has patient had a PCN reaction that required hospitalization: no Has patient had a PCN reaction occurring within the last 10 years: no If all of the above answers are "NO", then may proceed with Cephalosporin use.    Family History  Problem Relation Age of Onset  . Heart failure Father   . Heart attack Father   . Ovarian cancer Mother   . Arthritis Mother   . Anxiety disorder Mother     Prior to Admission  medications   Medication Sig Start Date End Date Taking? Authorizing Provider  albuterol (PROVENTIL HFA;VENTOLIN HFA) 108 (90 BASE) MCG/ACT inhaler Inhale 2 puffs into the lungs every 6 (six) hours as needed for wheezing.   Yes [provider]  cyclobenzaprine (FLEXERIL) 10 MG tablet Take 10 mg by mouth at bedtime.   Yes [provider]  famotidine (PEPCID) 20 MG tablet Take 1 tablet (20 mg total) by mouth at bedtime. 03/24/17  Yes Rehman, Mechele Dawley, MD  methocarbamol (ROBAXIN) 750 MG tablet Take 750  mg by mouth every morning.   Yes [provider]  Multiple Vitamins-Minerals (ALIVE MENS ENERGY PO) Take 1 tablet by mouth daily.   Yes [provider]  pantoprazole (PROTONIX) 40 MG tablet Take 1 tablet (40 mg total) by mouth daily before breakfast. 03/24/17  Yes Rehman, Mechele Dawley, MD  traMADol (ULTRAM) 50 MG tablet Take 1 tablet (50 mg total) by mouth every 6 (six) hours as needed. 04/20/17 04/20/18  Jani Gravel, MD    Physical Exam: Vitals:   05/24/17 2300 05/24/17 2315 05/24/17 2330 05/24/17 2351  BP: 117/87 129/83 121/85 119/87  Pulse: 91 95 (!) 101 94  Resp: 16 19 17  (!) 22  Temp:    97.6 F (36.4 C)  TempSrc:    Oral  SpO2: 96% 92% 94% 96%  Weight:      Height:          Constitutional: Moderately built and nourished. Vitals:   05/24/17 2300 05/24/17 2315 05/24/17 2330 05/24/17 2351  BP: 117/87 129/83 121/85 119/87  Pulse: 91 95 (!) 101 94  Resp: 16 19 17  (!) 22  Temp:    97.6 F (36.4 C)  TempSrc:    Oral  SpO2: 96% 92% 94% 96%  Weight:      Height:       Eyes: Anicteric no pallor. ENMT: No discharge from the ears eyes nose or mouth. Neck: Patient is on a cervical collar. Respiratory: No rhonchi or crepitations. Cardiovascular: S1-S2 heard no murmurs appreciated. Abdomen: Soft nontender bowel sounds present. Musculoskeletal: No edema.  No joint effusion. Skin: No rash.  Skin appears warm. Neurologic: Alert awake oriented to time place and person.  Patient has mild hyperreflexia of the lower extremities.  Difficulty to move his extremities due to weakness.  Strength is around 1 x 5 in all extremities. Psychiatric: Appears normal.  Normal affect.   Labs on Admission: I have personally reviewed following labs and imaging studies  CBC: Recent Labs  Lab 05/24/17 1740 05/24/17 1746  WBC 6.9  --   NEUTROABS 3.9  --   HGB 16.4 16.0  HCT 44.7 47.0  MCV 82.9  --   PLT 218  --    Basic Metabolic Panel: Recent Labs  Lab 05/24/17 1740  05/24/17 1746  NA 139 142  K 3.1* 3.0*  CL 109 108  CO2 19*  --   GLUCOSE 105* 98  BUN 19 20  CREATININE 1.36* 1.30*  CALCIUM 9.2  --    GFR: Estimated Creatinine Clearance: 75.6 mL/min (A) (by C-G formula based on SCr of 1.3 mg/dL (H)). Liver Function Tests: Recent Labs  Lab 05/24/17 1740  AST 33  ALT 39  ALKPHOS 65  BILITOT 0.6  PROT 6.9  ALBUMIN 4.1   No results for input(s): LIPASE, AMYLASE in the last 168 hours. No results for input(s): AMMONIA in the last 168 hours. Coagulation Profile: No results for input(s): INR, PROTIME in the last 168  hours. Cardiac Enzymes: No results for input(s): CKTOTAL, CKMB, CKMBINDEX, TROPONINI in the last 168 hours. BNP (last 3 results) No results for input(s): PROBNP in the last 8760 hours. HbA1C: No results for input(s): HGBA1C in the last 72 hours. CBG: No results for input(s): GLUCAP in the last 168 hours. Lipid Profile: No results for input(s): CHOL, HDL, LDLCALC, TRIG, CHOLHDL, LDLDIRECT in the last 72 hours. Thyroid Function Tests: No results for input(s): TSH, T4TOTAL, FREET4, T3FREE, THYROIDAB in the last 72 hours. Anemia Panel: No results for input(s): VITAMINB12, FOLATE, FERRITIN, TIBC, IRON, RETICCTPCT in the last 72 hours. Urine analysis:    Component Value Date/Time   COLORURINE YELLOW 04/20/2017 [place] 04/20/2017 1610   LABSPEC 1.009 04/20/2017 1610   PHURINE 7.0 04/20/2017 1610   GLUCOSEU NEGATIVE 04/20/2017 1610   HGBUR NEGATIVE 04/20/2017 1610   BILIRUBINUR NEGATIVE 04/20/2017 1610   KETONESUR 5 (A) 04/20/2017 1610   PROTEINUR NEGATIVE 04/20/2017 1610   UROBILINOGEN 0.2 12/10/2014 0210   NITRITE NEGATIVE 04/20/2017 1610   LEUKOCYTESUR NEGATIVE 04/20/2017 1610   Sepsis Labs: @LABRCNTIP (procalcitonin:4,lacticidven:4) )No results found for this or any previous visit (from the past 240 hour(s)).   Radiological Exams on Admission: Ct Angio Head W Or Wo Contrast  Result Date:  05/24/2017 CLINICAL DATA:  Tingling and numbness in both lower extremities. Fall with neck injury in January. Suspected vertebral artery dissection seen on earlier MRI. EXAM: CT HEAD WITHOUT CONTRAST CT ANGIOGRAPHY OF THE NECK TECHNIQUE: Contiguous axial images were obtained from the base of the skull through the vertex without intravenous contrast. Multidetector CT imaging of the neck was performed using the standard protocol during bolus administration of intravenous contrast. Multiplanar CT image reconstructions and MIPs were obtained to evaluate the vascular anatomy. Carotid stenosis measurements (when applicable) are obtained utilizing NASCET criteria, using the distal internal carotid diameter as the denominator. CONTRAST:  50 mL Isovue 370 COMPARISON:  CT head neck 01/29/2016 FINDINGS: CTA NECK FINDINGS Aortic arch: There is no calcific atherosclerosis of the aortic arch. There is no aneurysm, dissection or hemodynamically significant stenosis of the visualized ascending aorta and aortic arch. Conventional 3 vessel aortic branching pattern. The visualized proximal subclavian arteries are widely patent. Right carotid system: The right common carotid origin is widely patent. There is no common carotid or internal carotid artery dissection or aneurysm. No hemodynamically significant stenosis. Left carotid system: The left common carotid origin is widely patent. There is no common carotid or internal carotid artery dissection or aneurysm. No hemodynamically significant stenosis. Vertebral arteries: The vertebral system is left dominant. Both vertebral artery origins are normal. Both vertebral arteries are normal to their confluence with the basilar artery. The focal narrowing seen on the T2-weighted images of the earlier MRI is not confirmed here. Retrospectively, the right vertebral artery flow void is normal on the traditional spin echo T2-weighted sequence from the MRI. It is only on the high-resolution CUBE  sequence that it is abnormal. Skeleton: There is no bony spinal canal stenosis. No lytic or blastic lesions. Other neck: The nasopharynx is clear. The oropharynx and hypopharynx are normal. The epiglottis is normal. The supraglottic larynx, glottis and subglottic larynx are normal. No retropharyngeal collection. The parapharyngeal spaces are preserved. The parotid and submandibular glands are normal. No sialolithiasis or salivary ductal dilatation. The thyroid gland is normal. There is no cervical lymphadenopathy. Upper chest: No pneumothorax or pleural effusion. No nodules or masses. Review of the MIP images confirms the above findings CTA HEAD  FINDINGS Anterior circulation: --Intracranial internal carotid arteries: Normal. --Anterior cerebral arteries: Normal. --Middle cerebral arteries: Normal. --Posterior communicating arteries: Absent bilaterally. Posterior circulation: --Posterior cerebral arteries: Normal. --Superior cerebellar arteries: Normal. --Basilar artery: Normal. --Anterior inferior cerebellar arteries: Normal. --Posterior inferior cerebellar arteries: Normal. Venous sinuses: As permitted by contrast timing, patent. Anatomic variants: None Delayed phase: No parenchymal contrast enhancement. Review of the MIP images confirms the above findings. IMPRESSION: 1. Normal CTA of the head and neck. 2. The severe narrowing of the right vertebral artery flow void seen on the same day MRI is not confirmed on this study. Retrospectively, the right vertebral abnormality was present only on the T2-weighted CUBE sequence and not on the traditional T2-weighted sequence. Thus, it is probably artifactual. Given the combination of all these factors the right vertebral artery is considered normal without evidence of dissection. These results were called by telephone at the time of interpretation on 05/24/2017 at 9:05 pm to Dr. Noemi Chapel , who verbally acknowledged these results. Electronically Signed   By: Ulyses Jarred M.D.   On: 05/24/2017 21:05   Ct Angio Neck W And/or Wo Contrast  Result Date: 05/24/2017 CLINICAL DATA:  Tingling and numbness in both lower extremities. Fall with neck injury in January. Suspected vertebral artery dissection seen on earlier MRI. EXAM: CT HEAD WITHOUT CONTRAST CT ANGIOGRAPHY OF THE NECK TECHNIQUE: Contiguous axial images were obtained from the base of the skull through the vertex without intravenous contrast. Multidetector CT imaging of the neck was performed using the standard protocol during bolus administration of intravenous contrast. Multiplanar CT image reconstructions and MIPs were obtained to evaluate the vascular anatomy. Carotid stenosis measurements (when applicable) are obtained utilizing NASCET criteria, using the distal internal carotid diameter as the denominator. CONTRAST:  50 mL Isovue 370 COMPARISON:  CT head neck 01/29/2016 FINDINGS: CTA NECK FINDINGS Aortic arch: There is no calcific atherosclerosis of the aortic arch. There is no aneurysm, dissection or hemodynamically significant stenosis of the visualized ascending aorta and aortic arch. Conventional 3 vessel aortic branching pattern. The visualized proximal subclavian arteries are widely patent. Right carotid system: The right common carotid origin is widely patent. There is no common carotid or internal carotid artery dissection or aneurysm. No hemodynamically significant stenosis. Left carotid system: The left common carotid origin is widely patent. There is no common carotid or internal carotid artery dissection or aneurysm. No hemodynamically significant stenosis. Vertebral arteries: The vertebral system is left dominant. Both vertebral artery origins are normal. Both vertebral arteries are normal to their confluence with the basilar artery. The focal narrowing seen on the T2-weighted images of the earlier MRI is not confirmed here. Retrospectively, the right vertebral artery flow void is normal on the  traditional spin echo T2-weighted sequence from the MRI. It is only on the high-resolution CUBE sequence that it is abnormal. Skeleton: There is no bony spinal canal stenosis. No lytic or blastic lesions. Other neck: The nasopharynx is clear. The oropharynx and hypopharynx are normal. The epiglottis is normal. The supraglottic larynx, glottis and subglottic larynx are normal. No retropharyngeal collection. The parapharyngeal spaces are preserved. The parotid and submandibular glands are normal. No sialolithiasis or salivary ductal dilatation. The thyroid gland is normal. There is no cervical lymphadenopathy. Upper chest: No pneumothorax or pleural effusion. No nodules or masses. Review of the MIP images confirms the above findings CTA HEAD FINDINGS Anterior circulation: --Intracranial internal carotid arteries: Normal. --Anterior cerebral arteries: Normal. --Middle cerebral arteries: Normal. --Posterior communicating arteries: Absent bilaterally. Posterior  circulation: --Posterior cerebral arteries: Normal. --Superior cerebellar arteries: Normal. --Basilar artery: Normal. --Anterior inferior cerebellar arteries: Normal. --Posterior inferior cerebellar arteries: Normal. Venous sinuses: As permitted by contrast timing, patent. Anatomic variants: None Delayed phase: No parenchymal contrast enhancement. Review of the MIP images confirms the above findings. IMPRESSION: 1. Normal CTA of the head and neck. 2. The severe narrowing of the right vertebral artery flow void seen on the same day MRI is not confirmed on this study. Retrospectively, the right vertebral abnormality was present only on the T2-weighted CUBE sequence and not on the traditional T2-weighted sequence. Thus, it is probably artifactual. Given the combination of all these factors the right vertebral artery is considered normal without evidence of dissection. These results were called by telephone at the time of interpretation on 05/24/2017 at 9:05 pm to Dr.  Noemi Chapel , who verbally acknowledged these results. Electronically Signed   By: Ulyses Jarred M.D.   On: 05/24/2017 21:05   Mr Brain Wo Contrast  Result Date: 05/24/2017 CLINICAL DATA:  Acute neck pain with difficulty moving extremities. EXAM: MRI HEAD WITHOUT CONTRAST TECHNIQUE: Multiplanar, multiecho pulse sequences of the brain and surrounding structures were obtained without intravenous contrast. COMPARISON:  None. FINDINGS: Brain: Partially empty sella. There is no acute infarct or acute hemorrhage. No mass lesion, hydrocephalus, dural abnormality or extra-axial collection. The brain parenchymal signal is normal for the patient's age. No age-advanced or lobar predominant atrophy. No chronic microhemorrhage or superficial siderosis. Vascular: Major intracranial arterial and venous sinus flow voids are preserved. Skull and upper cervical spine: The visualized skull base, calvarium, upper cervical spine and extracranial soft tissues are normal. Sinuses/Orbits: No fluid levels or advanced mucosal thickening. No mastoid or middle ear effusion. Normal orbits. IMPRESSION: Normal brain MRI. Electronically Signed   By: Ulyses Jarred M.D.   On: 05/24/2017 22:38   Mr Cervical Spine Wo Contrast  Result Date: 05/24/2017 CLINICAL DATA:  Cervical spine injury January 27. EXAM: MRI CERVICAL AND LUMBAR SPINE WITHOUT CONTRAST TECHNIQUE: Multiplanar and multiecho pulse sequences of the cervical spine, to include the craniocervical junction and cervicothoracic junction, and lumbar spine, were obtained without intravenous contrast. COMPARISON:  Cervical spine MRI 05/17/2017 FINDINGS: MRI CERVICAL SPINE FINDINGS Alignment: Physiologic. Vertebrae: Hyperintense T2-weighted signal focus at the C6 vertebral body with low T1-weighted signal, probably a fat poor hemangioma. No acute fracture. No discitis-osteomyelitis. Cord: Normal Posterior Fossa, vertebral arteries, paraspinal tissues: There is an acute dissection of the  right vertebral artery. The artery is poorly visualized below the C4-5 level, but there is clearly abnormal T2-weighted signal surrounding and above this level, which is new compared to 05/17/2017. Disc levels: C1-C2: Normal. C2-C3: Normal disc space and facets. No spinal canal or neuroforaminal stenosis. C3-C4: Small disc osteophyte complex with mild bilateral foraminal narrowing. C4-C5: Small disc osteophyte complex with mild right and moderate left neural foraminal stenosis. C5-C6: Bilobed disc protrusion effaces the thecal sac and deforms the spinal cord, unchanged. No signal change within the spinal cord. There is severe spinal canal stenosis with mild right and severe left neural foraminal stenosis. C6-C7: Small disc osteophyte complex with mild spinal canal stenosis and mild right, moderate left foraminal stenosis. C7-T1: Normal disc space and facets. No spinal canal or neuroforaminal stenosis. MRI LUMBAR SPINE FINDINGS Segmentation:  Standard. Alignment:  Physiologic. Vertebrae:  No fracture, evidence of discitis, or bone lesion. Conus medullaris and cauda equina: Conus extends to the L2 level. Conus and cauda equina appear normal. Paraspinal and other soft tissues: The  visualized vascular, retroperitoneal and paraspinal structures are normal. Disc levels: The T11-L4 levels are normal. L4-L5: Moderate facet hypertrophy with small central disc protrusion. No stenosis. L5-S1: Normal. IMPRESSION: 1. 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 The right vertebral artery origin and proximal V2 segment are incompletely visualized on this study. Brain MRI/MRA without contrast should be considered to assess for intracranial acute ischemia. CTA of the neck could provide further detail of the right vertebral artery, particularly its proximal portion, but this is not necessary for confirmation of the dissection. 2. Unchanged severe spinal canal stenosis at C5-C6 with  cord deformity but no signal change. Severe left neural foraminal stenosis also at this level. 3. No acute abnormality or significant stenosis of the lumbar spine. These results were called by telephone at the time of interpretation on 05/24/2017 at 7:58 pm to Dr. Noemi Chapel , who verbally acknowledged these results. Electronically Signed   By: Ulyses Jarred M.D.   On: 05/24/2017 20:05   Mr Lumbar Spine Wo Contrast  Result Date: 05/24/2017 CLINICAL DATA:  Cervical spine injury January 27. EXAM: MRI CERVICAL AND LUMBAR SPINE WITHOUT CONTRAST TECHNIQUE: Multiplanar and multiecho pulse sequences of the cervical spine, to include the craniocervical junction and cervicothoracic junction, and lumbar spine, were obtained without intravenous contrast. COMPARISON:  Cervical spine MRI 05/17/2017 FINDINGS: MRI CERVICAL SPINE FINDINGS Alignment: Physiologic. Vertebrae: Hyperintense T2-weighted signal focus at the C6 vertebral body with low T1-weighted signal, probably a fat poor hemangioma. No acute fracture. No discitis-osteomyelitis. Cord: Normal Posterior Fossa, vertebral arteries, paraspinal tissues: There is an acute dissection of the right vertebral artery. The artery is poorly visualized below the C4-5 level, but there is clearly abnormal T2-weighted signal surrounding and above this level, which is new compared to 05/17/2017. Disc levels: C1-C2: Normal. C2-C3: Normal disc space and facets. No spinal canal or neuroforaminal stenosis. C3-C4: Small disc osteophyte complex with mild bilateral foraminal narrowing. C4-C5: Small disc osteophyte complex with mild right and moderate left neural foraminal stenosis. C5-C6: Bilobed disc protrusion effaces the thecal sac and deforms the spinal cord, unchanged. No signal change within the spinal cord. There is severe spinal canal stenosis with mild right and severe left neural foraminal stenosis. C6-C7: Small disc osteophyte complex with mild spinal canal stenosis and mild  right, moderate left foraminal stenosis. C7-T1: Normal disc space and facets. No spinal canal or neuroforaminal stenosis. MRI LUMBAR SPINE FINDINGS Segmentation:  Standard. Alignment:  Physiologic. Vertebrae:  No fracture, evidence of discitis, or bone lesion. Conus medullaris and cauda equina: Conus extends to the L2 level. Conus and cauda equina appear normal. Paraspinal and other soft tissues: The visualized vascular, retroperitoneal and paraspinal structures are normal. Disc levels: The T11-L4 levels are normal. L4-L5: Moderate facet hypertrophy with small central disc protrusion. No stenosis. L5-S1: Normal. IMPRESSION: 1. 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 The right vertebral artery origin and proximal V2 segment are incompletely visualized on this study. Brain MRI/MRA without contrast should be considered to assess for intracranial acute ischemia. CTA of the neck could provide further detail of the right vertebral artery, particularly its proximal portion, but this is not necessary for confirmation of the dissection. 2. 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 Severe left neural foraminal stenosis also at this level. 3. No acute abnormality or significant stenosis of the lumbar spine. These results were  called by telephone at the time of interpretation on 05/24/2017 at 7:58 pm to Dr. Noemi Chapel , who verbally acknowledged these results. Electronically Signed   By: Ulyses Jarred M.D.   On: 05/24/2017 20:05    EKG: Independently reviewed.  Normal sinus rhythm.  Assessment/Plan Principal Problem:   Generalized weakness Active Problems:   Radicular pain of left lower extremity   Left sided numbness    1. Weakness of all lower extremity with neck and lower extremity pain likely from severe stenosis of the C5-6 with cord deformity -patient is on a neck collar and continued on pain relief  medication and antispasmodic.  Patient likely will go for surgery.  Awaiting further recommendations from orthopedic surgery. 2. History of GERD  -we will continue Pepcid.  Patient's creatinine is elevated but GFR is normal.   DVT prophylaxis: SCDs. Code Status: Full code. Family Communication: Discussed with patient. Disposition Plan: To be determined. Consults called: Neurologist, vascular surgeon and orthopedic surgeon. Admission status: Observation.   Rise Patience MD [facility] Pager [phone].  If 7PM-7AM, please contact night-coverage [email or web address] Password [email or web address]  05/25/2017, 1:35 AM

## 2017-05-25 NOTE — Progress Notes (Signed)
Patient rounded on and stated that he felt much better than this morning. His vitals are much stable at this time. Resting quietly in bed at this time.

## 2017-05-26 ENCOUNTER — Inpatient Hospital Stay (HOSPITAL_COMMUNITY): Payer: BLUE CROSS/BLUE SHIELD | Admitting: Certified Registered"

## 2017-05-26 ENCOUNTER — Inpatient Hospital Stay (HOSPITAL_COMMUNITY)
Admission: EM | Disposition: A | Discharge status: Home Health | Payer: Self-pay | Source: Home / Self Care | Attending: Nephrology

## 2017-05-26 ENCOUNTER — Encounter (HOSPITAL_COMMUNITY): Payer: Self-pay | Admitting: *Deleted

## 2017-05-26 ENCOUNTER — Inpatient Hospital Stay (HOSPITAL_COMMUNITY): Payer: BLUE CROSS/BLUE SHIELD

## 2017-05-26 DIAGNOSIS — R55 Syncope and collapse: Secondary | ICD-10-CM

## 2017-05-26 HISTORY — PX: ANTERIOR CERVICAL DECOMP/DISCECTOMY FUSION: SHX1161

## 2017-05-26 LAB — CBC WITH DIFFERENTIAL/PLATELET
Basophils Absolute: 0 10*3/uL (ref 0.0–0.1)
Basophils Relative: 0 %
Eosinophils Absolute: 0.1 10*3/uL (ref 0.0–0.7)
Eosinophils Relative: 2 %
HEMATOCRIT: 38.5 % — AB (ref 39.0–52.0)
HEMOGLOBIN: 13 g/dL (ref 13.0–17.0)
LYMPHS ABS: 1.8 10*3/uL (ref 0.7–4.0)
LYMPHS PCT: 26 %
MCH: 29 pg (ref 26.0–34.0)
MCHC: 33.8 g/dL (ref 30.0–36.0)
MCV: 85.7 fL (ref 78.0–100.0)
Monocytes Absolute: 0.4 10*3/uL (ref 0.1–1.0)
Monocytes Relative: 6 %
NEUTROS PCT: 66 %
Neutro Abs: 4.4 10*3/uL (ref 1.7–7.7)
Platelets: 176 10*3/uL (ref 150–400)
RBC: 4.49 MIL/uL (ref 4.22–5.81)
RDW: 13.5 % (ref 11.5–15.5)
WBC: 6.7 10*3/uL (ref 4.0–10.5)

## 2017-05-26 LAB — BASIC METABOLIC PANEL
Anion gap: 8 (ref 5–15)
BUN: 21 mg/dL — AB (ref 6–20)
CHLORIDE: 109 mmol/L (ref 101–111)
CO2: 21 mmol/L — AB (ref 22–32)
Calcium: 8.5 mg/dL — ABNORMAL LOW (ref 8.9–10.3)
Creatinine, Ser: 1.24 mg/dL (ref 0.61–1.24)
GFR calc Af Amer: >60 mL/min (ref >60)
GFR calc non Af Amer: >60 mL/min (ref >60)
GLUCOSE: 97 mg/dL (ref 65–99)
POTASSIUM: 4 mmol/L (ref 3.5–5.1)
Sodium: 138 mmol/L (ref 135–145)

## 2017-05-26 LAB — GLUCOSE, CAPILLARY
GLUCOSE-CAPILLARY: 90 mg/dL (ref 65–99)
GLUCOSE-CAPILLARY: 125 mg/dL — AB (ref 65–99)

## 2017-05-26 LAB — ECHOCARDIOGRAM COMPLETE
Height: 71 in
Weight: 3120 oz

## 2017-05-26 LAB — TROPONIN I: Troponin I: <0.03 ng/mL (ref <0.03)

## 2017-05-26 LAB — SURGICAL PCR SCREEN
MRSA, PCR: NEGATIVE
STAPHYLOCOCCUS AUREUS: NEGATIVE

## 2017-05-26 LAB — MAGNESIUM: MAGNESIUM: 2.1 mg/dL (ref 1.7–2.4)

## 2017-05-26 LAB — CALCIUM: Calcium: 8.2 mg/dL — ABNORMAL LOW (ref 8.9–10.3)

## 2017-05-26 SURGERY — ANTERIOR CERVICAL DECOMPRESSION/DISCECTOMY FUSION 2 LEVELS
Anesthesia: General | Site: Neck

## 2017-05-26 MED ORDER — SODIUM CHLORIDE 0.9 % IV SOLN: 250.0000 mL | INTRAVENOUS | Status: DC

## 2017-05-26 MED ORDER — EPHEDRINE SULFATE 50 MG/ML IJ SOLN
INTRAMUSCULAR | Status: DC | PRN
  Administered 2017-05-26: 10 mg via INTRAVENOUS
  Administered 2017-05-26: 15 mg via INTRAVENOUS

## 2017-05-26 MED ORDER — ROCURONIUM BROMIDE 10 MG/ML (PF) SYRINGE
PREFILLED_SYRINGE | INTRAVENOUS | Status: AC
  Filled 2017-05-26: qty 5

## 2017-05-26 MED ORDER — FENTANYL CITRATE (PF) 250 MCG/5ML IJ SOLN
INTRAMUSCULAR | Status: AC
  Filled 2017-05-26: qty 5

## 2017-05-26 MED ORDER — FENTANYL CITRATE (PF) 100 MCG/2ML IJ SOLN: 25.0000 ug | INTRAMUSCULAR | Status: DC | PRN

## 2017-05-26 MED ORDER — 0.9 % SODIUM CHLORIDE (POUR BTL) OPTIME
TOPICAL | Status: DC | PRN
  Administered 2017-05-26 (×2): 1000 mL

## 2017-05-26 MED ORDER — FLEET ENEMA 7-19 GM/118ML RE ENEM: 1.0000 | ENEMA | Freq: Once | RECTAL | Status: DC | PRN

## 2017-05-26 MED ORDER — DIAZEPAM 5 MG PO TABS
5.0000 mg | ORAL_TABLET | Freq: Four times a day (QID) | ORAL | Status: DC | PRN
  Administered 2017-05-27 – 2017-05-29 (×3): 5 mg via ORAL
  Filled 2017-05-26 (×3): qty 1

## 2017-05-26 MED ORDER — PROMETHAZINE HCL 25 MG/ML IJ SOLN: 6.2500 mg | INTRAMUSCULAR | Status: DC | PRN

## 2017-05-26 MED ORDER — BUPIVACAINE-EPINEPHRINE (PF) 0.5% -1:200000 IJ SOLN
INTRAMUSCULAR | Status: AC
  Filled 2017-05-26: qty 30

## 2017-05-26 MED ORDER — THROMBIN 20000 UNITS EX SOLR
CUTANEOUS | Status: AC
  Filled 2017-05-26: qty 20000

## 2017-05-26 MED ORDER — SODIUM CHLORIDE 0.9% FLUSH
3.0000 mL | Freq: Two times a day (BID) | INTRAVENOUS | Status: DC
  Administered 2017-05-26 – 2017-05-27 (×3): 3 mL via INTRAVENOUS

## 2017-05-26 MED ORDER — MIDAZOLAM HCL 5 MG/5ML IJ SOLN: INTRAMUSCULAR | Status: DC | PRN

## 2017-05-26 MED ORDER — SUCCINYLCHOLINE CHLORIDE 20 MG/ML IJ SOLN
INTRAMUSCULAR | Status: AC
  Filled 2017-05-26: qty 1

## 2017-05-26 MED ORDER — SENNOSIDES-DOCUSATE SODIUM 8.6-50 MG PO TABS
1.0000 | ORAL_TABLET | Freq: Every evening | ORAL | Status: DC | PRN
  Administered 2017-05-27: 1 via ORAL
  Filled 2017-05-26: qty 1

## 2017-05-26 MED ORDER — LACTATED RINGERS IV SOLN
INTRAVENOUS | Status: DC
  Administered 2017-05-26 (×2): via INTRAVENOUS

## 2017-05-26 MED ORDER — VANCOMYCIN HCL IN DEXTROSE 1-5 GM/200ML-% IV SOLN
1000.0000 mg | Freq: Once | INTRAVENOUS | Status: AC
  Administered 2017-05-26: 1000 mg via INTRAVENOUS
  Filled 2017-05-26: qty 200

## 2017-05-26 MED ORDER — PHENYLEPHRINE HCL 10 MG/ML IJ SOLN
INTRAMUSCULAR | Status: DC | PRN
  Administered 2017-05-26: 30 ug/min via INTRAVENOUS

## 2017-05-26 MED ORDER — DEXAMETHASONE SODIUM PHOSPHATE 10 MG/ML IJ SOLN
INTRAMUSCULAR | Status: DC | PRN
  Administered 2017-05-26: 10 mg via INTRAVENOUS

## 2017-05-26 MED ORDER — THROMBIN (RECOMBINANT) 20000 UNITS EX SOLR
CUTANEOUS | Status: DC | PRN
  Administered 2017-05-26: 20 mL via TOPICAL

## 2017-05-26 MED ORDER — SUGAMMADEX SODIUM 200 MG/2ML IV SOLN
INTRAVENOUS | Status: DC | PRN
  Administered 2017-05-26: 180 mg via INTRAVENOUS

## 2017-05-26 MED ORDER — SCOPOLAMINE 1 MG/3DAYS TD PT72
1.0000 | MEDICATED_PATCH | TRANSDERMAL | Status: DC
  Administered 2017-05-26: 1.5 mg via TRANSDERMAL

## 2017-05-26 MED ORDER — MIDAZOLAM HCL 2 MG/2ML IJ SOLN
INTRAMUSCULAR | Status: AC
  Filled 2017-05-26: qty 2

## 2017-05-26 MED ORDER — SCOPOLAMINE 1 MG/3DAYS TD PT72
MEDICATED_PATCH | TRANSDERMAL | Status: AC
  Administered 2017-05-26: 16:00:00
  Filled 2017-05-26: qty 1

## 2017-05-26 MED ORDER — FENTANYL CITRATE (PF) 100 MCG/2ML IJ SOLN
INTRAMUSCULAR | Status: DC | PRN
  Administered 2017-05-26: 50 ug via INTRAVENOUS
  Administered 2017-05-26: 100 ug via INTRAVENOUS

## 2017-05-26 MED ORDER — ZOLPIDEM TARTRATE 5 MG PO TABS: 5.0000 mg | ORAL_TABLET | Freq: Every evening | ORAL | Status: DC | PRN

## 2017-05-26 MED ORDER — DEXMEDETOMIDINE HCL 200 MCG/2ML IV SOLN
INTRAVENOUS | Status: DC | PRN
  Administered 2017-05-26 (×2): 8 ug via INTRAVENOUS

## 2017-05-26 MED ORDER — MENTHOL 3 MG MT LOZG
1.0000 | LOZENGE | OROMUCOSAL | Status: DC | PRN
  Administered 2017-05-26: 3 mg via ORAL
  Filled 2017-05-26: qty 9

## 2017-05-26 MED ORDER — LIDOCAINE 2% (20 MG/ML) 5 ML SYRINGE
INTRAMUSCULAR | Status: AC
  Filled 2017-05-26: qty 5

## 2017-05-26 MED ORDER — ONDANSETRON HCL 4 MG/2ML IJ SOLN
INTRAMUSCULAR | Status: AC
  Filled 2017-05-26: qty 2

## 2017-05-26 MED ORDER — DOCUSATE SODIUM 100 MG PO CAPS
100.0000 mg | ORAL_CAPSULE | Freq: Two times a day (BID) | ORAL | Status: DC
  Administered 2017-05-26 – 2017-05-28 (×5): 100 mg via ORAL
  Filled 2017-05-26 (×6): qty 1

## 2017-05-26 MED ORDER — PHENOL 1.4 % MT LIQD: 1.0000 | OROMUCOSAL | Status: DC | PRN

## 2017-05-26 MED ORDER — SODIUM CHLORIDE 0.9% FLUSH
3.0000 mL | INTRAVENOUS | Status: DC | PRN
Start: 2017-05-26 — End: 2017-05-27
  Administered 2017-05-27: 3 mL via INTRAVENOUS
  Filled 2017-05-26: qty 3

## 2017-05-26 MED ORDER — DEXAMETHASONE SODIUM PHOSPHATE 10 MG/ML IJ SOLN
INTRAMUSCULAR | Status: AC
  Filled 2017-05-26: qty 1

## 2017-05-26 MED ORDER — 0.9 % SODIUM CHLORIDE (POUR BTL) OPTIME
TOPICAL | Status: DC | PRN
  Administered 2017-05-26: 1000 mL

## 2017-05-26 MED ORDER — BUPIVACAINE-EPINEPHRINE 0.5% -1:200000 IJ SOLN
INTRAMUSCULAR | Status: DC | PRN
  Administered 2017-05-26: 8 mL

## 2017-05-26 MED ORDER — SUGAMMADEX SODIUM 200 MG/2ML IV SOLN
INTRAVENOUS | Status: AC
  Filled 2017-05-26: qty 2

## 2017-05-26 MED ORDER — HYDROMORPHONE HCL 1 MG/ML IJ SOLN: 0.2500 mg | INTRAMUSCULAR | Status: DC | PRN

## 2017-05-26 MED ORDER — PANTOPRAZOLE SODIUM 40 MG IV SOLR
40.0000 mg | Freq: Every day | INTRAVENOUS | Status: DC
  Administered 2017-05-26: 40 mg via INTRAVENOUS
  Filled 2017-05-26: qty 40

## 2017-05-26 MED ORDER — PROPOFOL 10 MG/ML IV BOLUS
INTRAVENOUS | Status: DC | PRN
  Administered 2017-05-26: 200 mg via INTRAVENOUS

## 2017-05-26 MED ORDER — VANCOMYCIN HCL IN DEXTROSE 1-5 GM/200ML-% IV SOLN
INTRAVENOUS | Status: AC
  Administered 2017-05-26: 1000 mg via INTRAVENOUS
  Filled 2017-05-26: qty 200

## 2017-05-26 MED ORDER — ONDANSETRON HCL 4 MG/2ML IJ SOLN: 4.0000 mg | Freq: Once | INTRAMUSCULAR | Status: DC | PRN

## 2017-05-26 MED ORDER — LIDOCAINE HCL (CARDIAC) 20 MG/ML IV SOLN
INTRAVENOUS | Status: DC | PRN
  Administered 2017-05-26: 80 mg via INTRAVENOUS

## 2017-05-26 MED ORDER — OXYCODONE-ACETAMINOPHEN 5-325 MG PO TABS
1.0000 | ORAL_TABLET | ORAL | Status: DC | PRN
  Administered 2017-05-26: 2 via ORAL
  Administered 2017-05-27: 1 via ORAL
  Administered 2017-05-27 – 2017-05-28 (×3): 2 via ORAL
  Administered 2017-05-28: 1 via ORAL
  Filled 2017-05-26 (×2): qty 2
  Filled 2017-05-26: qty 1
  Filled 2017-05-26 (×2): qty 2
  Filled 2017-05-26: qty 1
  Filled 2017-05-26: qty 2
  Filled 2017-05-26: qty 1

## 2017-05-26 MED ORDER — ONDANSETRON HCL 4 MG/2ML IJ SOLN
INTRAMUSCULAR | Status: DC | PRN
  Administered 2017-05-26: 4 mg via INTRAVENOUS

## 2017-05-26 MED ORDER — PROPOFOL 10 MG/ML IV BOLUS
INTRAVENOUS | Status: AC
  Filled 2017-05-26: qty 20

## 2017-05-26 MED ORDER — PHENYLEPHRINE HCL 10 MG/ML IJ SOLN
INTRAMUSCULAR | Status: DC | PRN
  Administered 2017-05-26: 120 ug via INTRAVENOUS

## 2017-05-26 MED ORDER — ROCURONIUM BROMIDE 100 MG/10ML IV SOLN
INTRAVENOUS | Status: DC | PRN
  Administered 2017-05-26: 20 mg via INTRAVENOUS
  Administered 2017-05-26: 30 mg via INTRAVENOUS
  Administered 2017-05-26: 60 mg via INTRAVENOUS
  Administered 2017-05-26: 30 mg via INTRAVENOUS
  Administered 2017-05-26: 20 mg via INTRAVENOUS

## 2017-05-26 MED ORDER — ALUM & MAG HYDROXIDE-SIMETH 200-200-20 MG/5ML PO SUSP: 30.0000 mL | Freq: Four times a day (QID) | ORAL | Status: DC | PRN

## 2017-05-26 MED ORDER — BISACODYL 5 MG PO TBEC
5.0000 mg | DELAYED_RELEASE_TABLET | Freq: Every day | ORAL | Status: DC | PRN
  Administered 2017-05-28: 5 mg via ORAL
  Filled 2017-05-26: qty 1

## 2017-05-26 MED ORDER — VANCOMYCIN HCL IN DEXTROSE 1-5 GM/200ML-% IV SOLN
1000.0000 mg | Freq: Once | INTRAVENOUS | Status: AC
  Administered 2017-05-26 (×2): 1000 mg via INTRAVENOUS

## 2017-05-26 SURGICAL SUPPLY — 87 items
APL SKNCLS STERI-STRIP NONHPOA (GAUZE/BANDAGES/DRESSINGS) ×1
BENZOIN TINCTURE PRP APPL 2/3 (GAUZE/BANDAGES/DRESSINGS) ×3 IMPLANT
BIT DRILL NEURO 2X3.1 SFT TUCH (MISCELLANEOUS) ×1 IMPLANT
BIT DRILL SRG 14X2.2XFLT CHK (BIT) IMPLANT
BIT DRL SRG 14X2.2XFLT CHK (BIT) ×1
BLADE CLIPPER SURG (BLADE) ×3 IMPLANT
BLADE SURG 15 STRL LF DISP TIS (BLADE) ×1 IMPLANT
BLADE SURG 15 STRL SS (BLADE) ×3
BONE VIVIGEN FORMABLE 1.3CC (Bone Implant) ×6 IMPLANT
BUR MATCHSTICK NEURO 3.0 LAGG (BURR) ×2 IMPLANT
BURR 2MM HEAD SOFT TOUCH DRILL (BURR) ×2 IMPLANT
CARTRIDGE OIL MAESTRO DRILL (MISCELLANEOUS) ×1 IMPLANT
CLOSURE WOUND 1/2 X4 (GAUZE/BANDAGES/DRESSINGS) ×1
COLLAR CERV LO CONTOUR FIRM DE (SOFTGOODS) IMPLANT
CONT SPEC 4OZ CLIKSEAL STRL BL (MISCELLANEOUS) ×2 IMPLANT
CORDS BIPOLAR (ELECTRODE) ×3 IMPLANT
COVER SURGICAL LIGHT HANDLE (MISCELLANEOUS) ×1 IMPLANT
CRADLE DONUT ADULT HEAD (MISCELLANEOUS) ×3 IMPLANT
DECANTER SPIKE VIAL GLASS SM (MISCELLANEOUS) ×3 IMPLANT
DIFFUSER DRILL AIR PNEUMATIC (MISCELLANEOUS) ×3 IMPLANT
DRAIN JACKSON RD 7FR 3/32 (WOUND CARE) IMPLANT
DRAPE C-ARM 42X72 X-RAY (DRAPES) ×5 IMPLANT
DRAPE POUCH INSTRU U-SHP 10X18 (DRAPES) ×3 IMPLANT
DRAPE SURG 17X23 STRL (DRAPES) ×12 IMPLANT
DRILL BIT SKYLINE 14MM (BIT) ×3
DRILL NEURO 2X3.1 SOFT TOUCH (MISCELLANEOUS) ×3
DURAPREP 26ML APPLICATOR (WOUND CARE) ×3 IMPLANT
ELECT COATED BLADE 2.86 ST (ELECTRODE) ×3 IMPLANT
ELECT REM PT RETURN 9FT ADLT (ELECTROSURGICAL) ×3
ELECTRODE REM PT RTRN 9FT ADLT (ELECTROSURGICAL) ×1 IMPLANT
EVACUATOR SILICONE 100CC (DRAIN) IMPLANT
GAUZE SPONGE 4X4 12PLY STRL (GAUZE/BANDAGES/DRESSINGS) ×3 IMPLANT
GAUZE SPONGE 4X4 12PLY STRL LF (GAUZE/BANDAGES/DRESSINGS) ×2 IMPLANT
GAUZE SPONGE 4X4 16PLY XRAY LF (GAUZE/BANDAGES/DRESSINGS) ×5 IMPLANT
GLOVE BIO SURGEON STRL SZ7 (GLOVE) ×3 IMPLANT
GLOVE BIO SURGEON STRL SZ8 (GLOVE) ×3 IMPLANT
GLOVE BIOGEL PI IND STRL 7.0 (GLOVE) ×2 IMPLANT
GLOVE BIOGEL PI IND STRL 7.5 (GLOVE) IMPLANT
GLOVE BIOGEL PI IND STRL 8 (GLOVE) ×1 IMPLANT
GLOVE BIOGEL PI INDICATOR 7.0 (GLOVE) ×4
GLOVE BIOGEL PI INDICATOR 7.5 (GLOVE) ×2
GLOVE BIOGEL PI INDICATOR 8 (GLOVE) ×4
GLOVE ECLIPSE 7.5 STRL STRAW (GLOVE) ×6 IMPLANT
GOWN STRL REUS W/ TWL LRG LVL3 (GOWN DISPOSABLE) ×1 IMPLANT
GOWN STRL REUS W/ TWL XL LVL3 (GOWN DISPOSABLE) ×1 IMPLANT
GOWN STRL REUS W/TWL 2XL LVL3 (GOWN DISPOSABLE) ×2 IMPLANT
GOWN STRL REUS W/TWL LRG LVL3 (GOWN DISPOSABLE) ×3
GOWN STRL REUS W/TWL XL LVL3 (GOWN DISPOSABLE) ×6
GRAFT BNE MATRIX VG FRMBL SM 1 (Bone Implant) IMPLANT
IMPL S ENDOSKEL TC 8MM ODEG (Orthopedic Implant) IMPLANT
IMPLANT S ENDOSKEL TC 8MM ODEG (Orthopedic Implant) ×3 IMPLANT
INTERLOCK LRDTC CRVCL VBR 9MM (Bone Implant) IMPLANT
IV CATH 14GX2 1/4 (CATHETERS) ×3 IMPLANT
KIT BASIN OR (CUSTOM PROCEDURE TRAY) ×3 IMPLANT
KIT ROOM TURNOVER OR (KITS) ×3 IMPLANT
LORDOTIC CERVICAL VBR SM 9MM (Bone Implant) ×3 IMPLANT
MANIFOLD NEPTUNE II (INSTRUMENTS) ×3 IMPLANT
NDL PRECISIONGLIDE 27X1.5 (NEEDLE) ×1 IMPLANT
NDL SPNL 20GX3.5 QUINCKE YW (NEEDLE) ×1 IMPLANT
NEEDLE PRECISIONGLIDE 27X1.5 (NEEDLE) ×3 IMPLANT
NEEDLE SPNL 20GX3.5 QUINCKE YW (NEEDLE) ×3 IMPLANT
NS IRRIG 1000ML POUR BTL (IV SOLUTION) ×7 IMPLANT
OIL CARTRIDGE MAESTRO DRILL (MISCELLANEOUS) ×3
PACK ORTHO CERVICAL (CUSTOM PROCEDURE TRAY) ×3 IMPLANT
PAD ARMBOARD 7.5X6 YLW CONV (MISCELLANEOUS) ×6 IMPLANT
PATTIES SURGICAL .5 X.5 (GAUZE/BANDAGES/DRESSINGS) IMPLANT
PATTIES SURGICAL .5 X1 (DISPOSABLE) ×3 IMPLANT
PIN DISTRACTION 14 (PIN) ×4 IMPLANT
PLATE SKYLINE 2 LEVEL 34MM (Plate) ×2 IMPLANT
SCREW SKYLINE VAR OS 14MM (Screw) ×12 IMPLANT
SPONGE INTESTINAL PEANUT (DISPOSABLE) ×3 IMPLANT
SPONGE SURGIFOAM ABS GEL 100 (HEMOSTASIS) ×3 IMPLANT
STRIP CLOSURE SKIN 1/2X4 (GAUZE/BANDAGES/DRESSINGS) ×2 IMPLANT
SURGIFLO W/THROMBIN 8M KIT (HEMOSTASIS) IMPLANT
SUT MNCRL AB 4-0 PS2 18 (SUTURE) ×3 IMPLANT
SUT SILK 4 0 (SUTURE)
SUT SILK 4-0 18XBRD TIE 12 (SUTURE) IMPLANT
SUT VIC AB 2-0 CT2 18 VCP726D (SUTURE) ×3 IMPLANT
SYR BULB IRRIGATION 50ML (SYRINGE) ×3 IMPLANT
SYR CONTROL 10ML LL (SYRINGE) ×13 IMPLANT
TAPE CLOTH 4X10 WHT NS (GAUZE/BANDAGES/DRESSINGS) ×1 IMPLANT
TAPE CLOTH SURG 4X10 WHT LF (GAUZE/BANDAGES/DRESSINGS) ×2 IMPLANT
TAPE UMBILICAL COTTON 1/8X30 (MISCELLANEOUS) ×3 IMPLANT
TOWEL OR 17X24 6PK STRL BLUE (TOWEL DISPOSABLE) ×3 IMPLANT
TOWEL OR 17X26 10 PK STRL BLUE (TOWEL DISPOSABLE) ×3 IMPLANT
WATER STERILE IRR 1000ML POUR (IV SOLUTION) ×3 IMPLANT
YANKAUER SUCT BULB TIP NO VENT (SUCTIONS) ×3 IMPLANT

## 2017-05-26 NOTE — Progress Notes (Signed)
PT Cancellation Note  Patient Details Name: Zachary Nash MRN: [id number] DOB: November 19, 1970   Cancelled Treatment:    Reason Eval/Treat Not Completed: Patient at procedure or test/unavailable(to OR)   Duncan Dull 05/26/2017, 11:35 AM

## 2017-05-26 NOTE — Progress Notes (Signed)
   Follow Up Note  HPI:  Briefly pt with a hx of severe stenosis of cervical spine found after an accident at work on 04/25/17, presents to ED after sudden onset of tingling and pain sensations to his neck. Pt reported decreased sensation to his entire body with inability to move his limbs. Images done showed no other acute findings for his new symptoms other than severe stenosis of cervical spine. Orthopedic, neurology and vascular were consulted.  Initial imaging suggested vertebral art dissection, but f/u studies did not confirm, seen by vascular who stated no vascular issues.    Then had unresponsive episode lasting about 1 minute, f/u by neuro and EEG done , pt is stable for surgery.  In retrospect pt states today he thinks "that was probably the morphine. I'm not taking that anymore".    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    Today pt's numbness is much better after neck collar placement.  Has all feeling back in R side and L arm, some residual numbness in L leg.   Exam: CV: S1, S2 present Lungs: CTA Abd: soft, NT, ND, BS+ Ext: No pedal edema b/l Neuro: Pt in c-collar, no significant focal strength deficit, tenderness to lower back, unsteady gait  Present on Admission: . Left sided numbness . 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  Medically stable from medical standpoint.  To OR today per ortho.    2)  LOC episode- none further , EEG no seizures, OK for surg per neurology.   Plan - as above.

## 2017-05-26 NOTE — Transfer of Care (Signed)
Immediate Anesthesia Transfer of Care Note  Patient: Zachary Nash  Procedure(s) Performed: ANTERIOR CERVICAL DECOMPRESSION/DISCECTOMY FUSION TWO LEVELS Cervical five-six, Cervical six-seven (N/A Neck)  Patient Location: PACU  Anesthesia Type:General  Level of Consciousness: awake and patient cooperative  Airway & Oxygen Therapy: Patient Spontanous Breathing  Post-op Assessment: Report given to RN and Post -op Vital signs reviewed and stable  Post vital signs: Reviewed and stable  Last Vitals:  Vitals:   05/26/17 0824 05/26/17 1402  BP:  136/88  Pulse:  96  Resp:  (!) 9  Temp: (!) 36.4 C 36.9 C  SpO2:  100%    Last Pain:  Vitals:   05/26/17 1402  TempSrc:   PainSc: Asleep         Complications: No apparent anesthesia complications

## 2017-05-26 NOTE — Anesthesia Postprocedure Evaluation (Signed)
Anesthesia Post Note  Patient: Zachary Nash  Procedure(s) Performed: ANTERIOR CERVICAL DECOMPRESSION/DISCECTOMY FUSION TWO LEVELS Cervical five-six, Cervical six-seven (N/A Neck)     Patient location during evaluation: PACU Anesthesia Type: General Level of consciousness: awake and alert Pain management: pain level controlled Vital Signs Assessment: post-procedure vital signs reviewed and stable Respiratory status: spontaneous breathing, nonlabored ventilation, respiratory function stable and patient connected to nasal cannula oxygen Cardiovascular status: blood pressure returned to baseline and stable Postop Assessment: no apparent nausea or vomiting Anesthetic complications: no Comments: Vomiting event on emergence and extubation. Sats 100% on RA and no respiratory difficulty. Pt going to monitored bed.    Last Vitals:  Vitals:   05/26/17 1431 05/26/17 1447  BP: 125/79 117/77  Pulse: 87 81  Resp: 18 11  Temp: 36.4 C 36.4 C  SpO2: 100% 99%    Last Pain:  Vitals:   05/26/17 1447  TempSrc:   PainSc: Asleep                 Tiajuana Amass

## 2017-05-26 NOTE — Op Note (Signed)
NAME:  Zachary Nash                  MEDICAL RECORD NO.:  [id number]  PHYSICIAN:  Phylliss Bob, MD      DATE OF BIRTH:  1970/06/18  DATE OF PROCEDURE:  05/26/2017                              OPERATIVE REPORT   PREOPERATIVE DIAGNOSES: 1. Left-sided cervical radiculopathy. 2. Spinal cord compression 3.  Cervical myeloradiculopathy  POSTOPERATIVE DIAGNOSES: 1. Left-sided cervical radiculopathy. 2. Spinal cord compression 3.  Cervical myeloradiculopathy  PROCEDURE: 1. Anterior cervical decompression and fusion C5/6, C6/7. 2. Placement of anterior instrumentation, C5-C7. 3. Insertion of interbody device x2 (Titan intervertebral spacers). 4. Intraoperative use of fluoroscopy. 5. Use of morselized allograft - ViviGen.  SURGEON:  Phylliss Bob, MD  ASSISTANT:  Pricilla Holm, PA-C.  ANESTHESIA:  General endotracheal anesthesia.  COMPLICATIONS:  None.  DISPOSITION:  Stable.  ESTIMATED BLOOD LOSS:  Minimal.  INDICATIONS FOR SURGERY:  Briefly, Zachary Nash is a pleasant 47 year old patient, who did present to me with severe pain in his neck and left arm, as well as deterioration in balance.  The patient's MRI did reveal the findings noted above.   Given the patient's ongoing rather debilitating pain and lack of improvement with appropriate treatment measures, we did discuss proceeding with the procedure noted above.  The patient was fully aware of the risks and limitations of surgery as outlined in my preoperative office note.  Of note, the patient was admitted to the medical service recently for curious neurologic symptoms and musculoskeletal complaints, which I was not able  to explain.  The patient was worked up by a neurologist, who ruled out any  neurologic conditions.  I did explain to the patient that I was unable to explain  the constellation of symptoms that he presented with, however, he did clearly  have cervical myelopathy and left-sided cervical  radiculopathy with the MRI  findings noted above.  He did wish to proceed with surgery.  He did understand  the surgery was not expected to address the entirety of his constellation of symptoms.   He did wish to proceed.  OPERATIVE DETAILS:  On 05/26/2017, the patient was brought to surgery and general endotracheal anesthesia was administered.  The patient was placed supine on the hospital bed. The neck was gently extended.  All bony prominences were meticulously padded.  The neck was prepped and draped in the usual sterile fashion.  At this point, I did make a left-sided transverse incision.  The platysma was incised.  A Smith-Robinson approach was used and the anterior spine was identified. A self-retaining retractor was placed.  I then subperiosteally exposed the vertebral bodies from C5-C7.  Caspar pins were then placed into the C6 and C7 vertebral bodies and distraction was applied.  A thorough and complete C6/7 intervertebral diskectomy was performed.  The posterior longitudinal ligament was identified and entered using a nerve hook.  I then used #1 followed by #2 Kerrison to perform a thorough and complete intervertebral diskectomy.  The spinal canal was thoroughly decompressed, as was the left neuroforamen.  The endplates were then prepared and the appropriate-sized intervertebral spacer was then packed with ViviGen and tamped into position in the usual fashion.  The lower Caspar pin was then removed and placed into the C5 vertebral body and once again, distraction was applied across the C5/6  intervertebral space.  I then again performed a thorough and complete diskectomy, thoroughly decompressing the spinal canal and left neuroforamen.  After preparing the endplates, the appropriate-sized intervertebral spacer was packed with ViviGen and tamped into position.  The Caspar pins then were removed and bone wax was placed in their place.  The appropriate-sized anterior cervical plate  was placed over the anterior spine.  14 mm variable angle screws were placed, 2 in each vertebral body from C5-C7 for a total of 6 vertebral body screws.  The screws were then locked to the plate using the Cam locking mechanism.  I was very pleased with the final fluoroscopic images.  The wound was then irrigated.  The wound was then explored for any undue bleeding and there was no bleeding noted. The wound was then closed in layers using 2-0 Vicryl, followed by 4-0 Monocryl.  Benzoin and Steri-Strips were applied, followed by sterile dressing.  All instrument counts were correct at the termination of the procedure.  Of note, Pricilla Holm, PA-C, was my assistant throughout surgery, and did aid in retraction, suctioning, and closure from start to finish.     Phylliss Bob, MD

## 2017-05-26 NOTE — Progress Notes (Signed)
  Echocardiogram 2D Echocardiogram has been performed.  Jennette Dubin 05/26/2017, 9:00 AM

## 2017-05-26 NOTE — Anesthesia Procedure Notes (Signed)
Procedure Name: Intubation Date/Time: 05/26/2017 10:50 AM Performed by: Lance Coon, CRNA Pre-anesthesia Checklist: Patient identified, Emergency Drugs available, Suction available, Patient being monitored and Timeout performed Patient Re-evaluated:Patient Re-evaluated prior to induction Oxygen Delivery Method: Circle system utilized Preoxygenation: Pre-oxygenation with 100% oxygen Induction Type: IV induction Ventilation: Mask ventilation without difficulty Laryngoscope Size: Glidescope and 4 Grade View: Grade I Tube type: Oral Tube size: 7.5 mm Number of attempts: 1 Airway Equipment and Method: Stylet and Video-laryngoscopy Placement Confirmation: ETT inserted through vocal cords under direct vision,  positive ETCO2 and breath sounds checked- equal and bilateral Secured at: 22 cm Tube secured with: Tape Dental Injury: Teeth and Oropharynx as per pre-operative assessment

## 2017-05-26 NOTE — Progress Notes (Signed)
    Patient underwent C5-7 ACDF in the OR today without complication for myelopathy. The pt will return to the floor post op and return to the medicine service. From the standpoint of his cervical spine he will likely be ready to D/C home tomorrow. Hard collar for 2 weeks, no lift >10lbs. F/U appt in the office in 2 weeks (06/09/2017 at 8:15am). Per anesthesia the pt did have 1 episode of vomiting upon extubation. Will continue to monitor for any signs of aspiration.  Pricilla Holm, PA-C [phone]

## 2017-05-26 NOTE — Progress Notes (Addendum)
1915 Bedside shift report, pt resting in bed, aspen collar on, NAD, denies pain, pt happy able to move BLE. Surgical incision CDI. Fall precautions in place, [facility].   2000 Pt assessed, see flowsheet. Pt c/o of throat being sore, will medicate per MAR, updated with POC. Fall precautions in place, [facility].   2135 Pt medicated per MAR. Updated with POC, pt talking to sister on cell phone. RN updated her as well. No complaints at this time, [facility].   0230 Pt sleeping soundly, NAD, fall precautions in place, [facility].   0445 Pt sleeping, easy to arouse, VSS, pt c/o neck pain. Attempted to medicate with Morphine but pt refused, stated he "passed out" the last time he had it. Wasted in sink with [place], Therapist, sports. Pt given percocet. Sister on phone and updated with POC. All questions answered. Fall precautions in place, [facility].   0645 Pt sleeping comfortably, NAD, fall precautions in place, awaiting for dayshift RN for report.

## 2017-05-26 NOTE — Progress Notes (Signed)
OT Cancellation Note  Patient Details Name: Zachary Nash MRN: [id number] DOB: 1971/03/19   Cancelled Treatment:    Reason Eval/Treat Not Completed: Patient at procedure or test/ unavailable.  Will reattempt.  Kennedy, OTR/L [phone]   [name] 05/26/2017, 11:40 AM

## 2017-05-26 NOTE — Anesthesia Preprocedure Evaluation (Addendum)
Anesthesia Evaluation  Patient identified by MRN, date of birth, ID band Patient awake    Reviewed: Allergy & Precautions, NPO status , Patient's Chart, lab work & pertinent test results  History of Anesthesia Complications (+) PONV  Airway Mallampati: II  TM Distance: >3 FB Neck ROM: Limited   Comment: c-collar in place Dental  (+) Dental Advisory Given   Pulmonary asthma ,    breath sounds clear to auscultation       Cardiovascular negative cardio ROS   Rhythm:Regular Rate:Normal     Neuro/Psych  Headaches, Cervical stenosis    GI/Hepatic Neg liver ROS, GERD  ,  Endo/Other  negative endocrine ROS  Renal/GU negative Renal ROS     Musculoskeletal   Abdominal   Peds  Hematology   Anesthesia Other Findings   Reproductive/Obstetrics                            Lab Results  Component Value Date   WBC 6.7 05/26/2017   HGB 13.0 05/26/2017   HCT 38.5 (L) 05/26/2017   MCV 85.7 05/26/2017   PLT 176 05/26/2017   Lab Results  Component Value Date   CREATININE 1.24 05/26/2017   BUN 21 (H) 05/26/2017   NA 138 05/26/2017   K 4.0 05/26/2017   CL 109 05/26/2017   CO2 21 (L) 05/26/2017    Anesthesia Physical Anesthesia Plan  ASA: III  Anesthesia Plan: General   Post-op Pain Management:    Induction: Intravenous  PONV Risk Score and Plan: 3 and Dexamethasone, Ondansetron, Treatment may vary due to age or medical condition and Scopolamine patch - Pre-op  Airway Management Planned: Oral ETT and Video Laryngoscope Planned  Additional Equipment:   Intra-op Plan:   Post-operative Plan: Extubation in OR  Informed Consent: I have reviewed the patients History and Physical, chart, labs and discussed the procedure including the risks, benefits and alternatives for the proposed anesthesia with the patient or authorized representative who has indicated his/her understanding and acceptance.    Dental advisory given  Plan Discussed with: CRNA  Anesthesia Plan Comments:        Anesthesia Quick Evaluation

## 2017-05-26 NOTE — Progress Notes (Signed)
PREOPERATIVE H&P  Chief Complaint: left arm pain and weakness  HPI: Zachary Nash is a 47 y.o. male who presents with ongoing pain in the left arm and ongoing balance deterioration  MRI reveals stenosis C5-C7 with [facility]  Neurology sees no contraindication to surgery despite patient's episode yesterday. Patient has been stable with normal EEG  Patient has failed multiple forms of conservative care and continues to have pain (see office notes for additional details regarding the patient's full course of treatment)  Past Medical History:  Diagnosis Date  . Anxiety   . Asthma   . Bilateral inguinal hernia 05/29/2016  . Complication of anesthesia    states he stopped breathing one time with anesthesia  . Constipation   . Fatty liver   . GERD (gastroesophageal reflux disease)   . History of kidney stones   . Pneumonia   . PONV (postoperative nausea and vomiting)   . Radicular pain of left lower extremity    Past Surgical History:  Procedure Laterality Date  . CHOLECYSTECTOMY    . ESOPHAGEAL DILATION N/A 03/24/2017   Procedure: ESOPHAGEAL DILATION;  Surgeon: Rogene Houston, MD;  Location: [facility];  Service: Endoscopy;  Laterality: N/A;  . ESOPHAGOGASTRODUODENOSCOPY N/A 03/24/2017   Procedure: ESOPHAGOGASTRODUODENOSCOPY (EGD);  Surgeon: Rogene Houston, MD;  Location: [facility];  Service: Endoscopy;  Laterality: N/A;  1155  . INGUINAL HERNIA REPAIR Bilateral 05/29/2016   Procedure: OPEN HERNIA REPAIR INGUINAL ADULT BILATERAL WITH insertion of MESH;  Surgeon: Fanny Skates, MD;  Location: [place];  Service: General;  Laterality: Bilateral;  . Lipoma removed     Social History   Socioeconomic History  . Marital status: Legally Separated    Spouse name: None  . Number of children: None  . Years of education: None  . Highest education level: None  Social Needs  . Financial resource strain: None  . Food insecurity - worry: None  . Food insecurity - inability: None   . Transportation needs - medical: None  . Transportation needs - non-medical: None  Occupational History  . None  Tobacco Use  . Smoking status: Never Smoker  . Smokeless tobacco: Never Used  Substance and Sexual Activity  . Alcohol use: No  . Drug use: No  . Sexual activity: None  Other Topics Concern  . None  Social History Narrative  . None   Family History  Problem Relation Age of Onset  . Heart failure Father   . Heart attack Father   . Ovarian cancer Mother   . Arthritis Mother   . Anxiety disorder Mother    Allergies  Allergen Reactions  . Penicillins Anaphylaxis and Swelling    Has patient had a PCN reaction causing immediate rash, facial/tongue/throat swelling, SOB or lightheadedness with hypotension: yes Has patient had a PCN reaction causing severe rash involving mucus membranes or skin necrosis: yes Has patient had a PCN reaction that required hospitalization: no Has patient had a PCN reaction occurring within the last 10 years: no If all of the above answers are "NO", then may proceed with Cephalosporin use.   Prior to Admission medications   Medication Sig Start Date End Date Taking? Authorizing Provider  albuterol (PROVENTIL HFA;VENTOLIN HFA) 108 (90 BASE) MCG/ACT inhaler Inhale 2 puffs into the lungs every 6 (six) hours as needed for wheezing.   Yes [provider]  cyclobenzaprine (FLEXERIL) 10 MG tablet Take 10 mg by mouth at bedtime.   Yes [provider]  famotidine (PEPCID) 20 MG tablet Take 1 tablet (20 mg total) by mouth at bedtime. 03/24/17  Yes Rehman, Mechele Dawley, MD  methocarbamol (ROBAXIN) 750 MG tablet Take 750 mg by mouth every morning.   Yes [provider]  Multiple Vitamins-Minerals (ALIVE MENS ENERGY PO) Take 1 tablet by mouth daily.   Yes [provider]  pantoprazole (PROTONIX) 40 MG tablet Take 1 tablet (40 mg total) by mouth daily before breakfast. 03/24/17  Yes Rehman, Mechele Dawley, MD  traMADol (ULTRAM) 50  MG tablet Take 1 tablet (50 mg total) by mouth every 6 (six) hours as needed. 04/20/17 04/20/18  Jani Gravel, MD     All other systems have been reviewed and were otherwise negative with the exception of those mentioned in the HPI and as above.  Physical Exam: Vitals:   05/25/17 2348 05/26/17 0412  BP: 118/78 107/81  Pulse: 91 71  Resp: 18 13  Temp: 97.9 F (36.6 C) 97.6 F (36.4 C)  SpO2: 96% 97%    Body mass index is 27.2 kg/m.  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

## 2017-05-27 ENCOUNTER — Encounter (HOSPITAL_COMMUNITY): Payer: Self-pay | Admitting: Orthopedic Surgery

## 2017-05-27 DIAGNOSIS — R55 Syncope and collapse: Secondary | ICD-10-CM

## 2017-05-27 DIAGNOSIS — R531 Weakness: Secondary | ICD-10-CM

## 2017-05-27 DIAGNOSIS — G9520 Unspecified cord compression: Secondary | ICD-10-CM

## 2017-05-27 DIAGNOSIS — M5410 Radiculopathy, site unspecified: Secondary | ICD-10-CM

## 2017-05-27 DIAGNOSIS — G959 Disease of spinal cord, unspecified: Secondary | ICD-10-CM

## 2017-05-27 DIAGNOSIS — Z419 Encounter for procedure for purposes other than remedying health state, unspecified: Secondary | ICD-10-CM

## 2017-05-27 LAB — BASIC METABOLIC PANEL
ANION GAP: 11 (ref 5–15)
BUN: 20 mg/dL (ref 6–20)
CHLORIDE: 106 mmol/L (ref 101–111)
CO2: 21 mmol/L — ABNORMAL LOW (ref 22–32)
Calcium: 9.1 mg/dL (ref 8.9–10.3)
Creatinine, Ser: 1.39 mg/dL — ABNORMAL HIGH (ref 0.61–1.24)
GFR calc Af Amer: >60 mL/min (ref >60)
GFR, EST NON AFRICAN AMERICAN: 59 mL/min — AB (ref >60)
GLUCOSE: 113 mg/dL — AB (ref 65–99)
POTASSIUM: 4 mmol/L (ref 3.5–5.1)
Sodium: 138 mmol/L (ref 135–145)

## 2017-05-27 LAB — CBC
HCT: 43.2 % (ref 39.0–52.0)
HEMOGLOBIN: 15 g/dL (ref 13.0–17.0)
MCH: 29.3 pg (ref 26.0–34.0)
MCHC: 34.7 g/dL (ref 30.0–36.0)
MCV: 84.4 fL (ref 78.0–100.0)
Platelets: 220 10*3/uL (ref 150–400)
RBC: 5.12 MIL/uL (ref 4.22–5.81)
RDW: 13 % (ref 11.5–15.5)
WBC: 12.9 10*3/uL — AB (ref 4.0–10.5)

## 2017-05-27 LAB — CALCIUM, IONIZED: CALCIUM, IONIZED, SERUM: 4.9 mg/dL (ref 4.5–5.6)

## 2017-05-27 LAB — GLUCOSE, CAPILLARY: Glucose-Capillary: 92 mg/dL (ref 65–99)

## 2017-05-27 MED ORDER — SODIUM CHLORIDE 0.9 % IV BOLUS (SEPSIS)
2000.0000 mL | Freq: Once | INTRAVENOUS | Status: AC
Start: 2017-05-27 — End: 2017-05-27
  Administered 2017-05-27: 2000 mL via INTRAVENOUS

## 2017-05-27 MED ORDER — DIAZEPAM 5 MG PO TABS: 5.0000 mg | ORAL_TABLET | Freq: Four times a day (QID) | ORAL | 0 refills | Status: DC | PRN

## 2017-05-27 MED ORDER — FAMOTIDINE 20 MG PO TABS: 20.0000 mg | ORAL_TABLET | Freq: Two times a day (BID) | ORAL | Status: DC

## 2017-05-27 MED ORDER — PANTOPRAZOLE SODIUM 40 MG PO TBEC
40.0000 mg | DELAYED_RELEASE_TABLET | Freq: Every day | ORAL | Status: DC
  Administered 2017-05-27 – 2017-05-28 (×2): 40 mg via ORAL
  Filled 2017-05-27 (×2): qty 1

## 2017-05-27 MED ORDER — OXYCODONE-ACETAMINOPHEN 5-325 MG PO TABS: 1.0000 | ORAL_TABLET | ORAL | 0 refills | Status: DC | PRN

## 2017-05-27 MED ORDER — SODIUM CHLORIDE 0.9 % IV BOLUS (SEPSIS)
2000.0000 mL | Freq: Once | INTRAVENOUS | Status: AC
  Administered 2017-05-27: 1000 mL via INTRAVENOUS

## 2017-05-27 MED ORDER — SODIUM CHLORIDE 0.45 % IV SOLN
INTRAVENOUS | Status: DC
  Administered 2017-05-27 – 2017-05-28 (×2): via INTRAVENOUS

## 2017-05-27 MED FILL — Thrombin For Soln 20000 Unit: CUTANEOUS | Qty: 1 | Status: AC

## 2017-05-27 NOTE — Progress Notes (Signed)
Text message sent to Dr Jonnie Finner that patient had a syncope episode while with OT to please advise. Orders received.

## 2017-05-27 NOTE — Evaluation (Signed)
Physical Therapy Evaluation Patient Details Name: Zachary Nash MRN: [id number] DOB: 1971/02/14 Today's Date: 05/27/2017   History of Present Illness  47 yo admitted for C5-7 ACDF with PMHx: GERD, anxiety  Clinical Impression  Pt very pleasant with dad and pastor present throughout session. 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 Pt educated for precautions but limited by orthostatic hypotension this session.  Pt rolled to side into sitting and then stood with report of needing to sit. Pt sat and reported being lightheaded and before BP could be taken pt became presyncopal and syncopal with max assist to return to supine. In supine BP 101/59 and 101/71 with consecutive readings. Attempted sitting up again with BP 91/52 (65) with progressive drop to 57/47 (52) over 2 minutes with pt returned to supine and bP 94/66 (75). Rn present and in room on 2nd sitting trial and providing fluid end of session. Pt very pleasant and eager to mobilize but limited by hypotension this session. Pt will benefit from acute therapy to maximize mobility, transfers, strength and gait to decrease burden of care.      Follow Up Recommendations Supervision/Assistance - 24 hour;Home health PT    Equipment Recommendations  Rolling walker with 5" wheels    Recommendations for Other Services OT consult     Precautions / Restrictions Precautions Precautions: Fall;Cervical;Other (comment) Precaution Comments: watch BP Required Braces or Orthoses: Cervical Brace Cervical Brace: At all times;Hard collar      Mobility  Bed Mobility Overal bed mobility: Needs Assistance Bed Mobility: Rolling;Sidelying to Sit;Sit to Sidelying Rolling: Min guard Sidelying to sit: Min assist     Sit to sidelying: Min assist General bed mobility comments: cues for sequence with assist to rise from side  Transfers Overall transfer level: Needs assistance    Transfers: Sit to/from Stand Sit to Stand: Min assist         General transfer comment: cues for hand placement and safety  Ambulation/Gait                Stairs            Wheelchair Mobility    Modified Rankin (Stroke Patients Only)       Balance Overall balance assessment: No apparent balance deficits (not formally assessed)                                           Pertinent Vitals/Pain Pain Assessment: No/denies pain    Home Living Family/patient expects to be discharged to:: Private residence Living Arrangements: Alone Available Help at Discharge: Family;Available PRN/intermittently Type of Home: House Home Access: Stairs to enter Entrance Stairs-Rails: Right Entrance Stairs-Number of Steps: 3 Home Layout: Two level Home Equipment: None      Prior Function Level of Independence: Independent         Comments: drives a trunk long haul     Hand Dominance        Extremity/Trunk Assessment   Upper Extremity Assessment Upper Extremity Assessment: Defer to OT evaluation    Lower Extremity Assessment Lower Extremity Assessment: Generalized weakness    Cervical / Trunk Assessment Cervical / Trunk Assessment: Other exceptions Cervical / Trunk Exceptions: post surgical. Unable to fully assess secondary to syncope  Communication   Communication: No difficulties  Cognition Arousal/Alertness: Awake/alert  Behavior During Therapy: WFL for tasks assessed/performed Overall Cognitive Status: Within Functional Limits for tasks assessed                                        General Comments      Exercises     Assessment/Plan    PT Assessment Patient needs continued PT services  PT Problem List Decreased mobility;Decreased safety awareness;Decreased activity tolerance;Decreased balance;Decreased knowledge of precautions;Decreased knowledge of use of DME       PT Treatment Interventions Gait  training;Therapeutic exercise;Patient/family education;Stair training;Functional mobility training;DME instruction;Therapeutic activities    PT Goals (Current goals can be found in the Care Plan section)  Acute Rehab PT Goals Patient Stated Goal: return home PT Goal Formulation: With patient/family Time For Goal Achievement: 06/10/17 Potential to Achieve Goals: Good    Frequency Min 5X/week   Barriers to discharge Decreased caregiver support      Co-evaluation               AM-PAC PT "6 Clicks" Daily Activity  Outcome Measure Difficulty turning over in bed (including adjusting bedclothes, sheets and blankets)?: A Little Difficulty moving from lying on back to sitting on the side of the bed? : Unable Difficulty sitting down on and standing up from a chair with arms (e.g., wheelchair, bedside commode, etc,.)?: Unable Help needed moving to and from a bed to chair (including a wheelchair)?: A Little Help needed walking in hospital room?: A Little Help needed climbing 3-5 steps with a railing? : A Little 6 Click Score: 14    End of Session Equipment Utilized During Treatment: Gait belt;Cervical collar Activity Tolerance: Treatment limited secondary to medical complications (Comment)(hypotension) Patient left: in bed;with call bell/phone within reach;with family/visitor present;with nursing/sitter in room Nurse Communication: Mobility status;Precautions PT Visit Diagnosis: Other abnormalities of gait and mobility (R26.89)    Time: 4158-3094 PT Time Calculation (min) (ACUTE ONLY): 31 min   Charges:   PT Evaluation $PT Eval Moderate Complexity: 1 Mod PT Treatments $Therapeutic Activity: 8-22 mins   PT G Codes:        Elwyn Reach, PT [phone]   Gabryella Murfin B Deaveon Schoen 05/27/2017, 11:17 AM

## 2017-05-27 NOTE — Progress Notes (Signed)
OT Cancellation Note  Patient Details Name: Zachary Nash MRN: [id number] DOB: 16-Oct-1970   Cancelled Treatment:    Reason Eval/Treat Not Completed: Medical issues which prohibited therapy.  Pt with syncopal episode this am.  Per MD notes pt to stay in bed this afternoon.  Will try back in morning/  [name], OTR/L [phone]   [name] 05/27/2017, 2:32 PM

## 2017-05-27 NOTE — Progress Notes (Addendum)
   Brief Summary  HPI:  Briefly pt with history of GERD who had recent injury during work on April 25, 2017 while pulling backwards on a truck release. He didn't see anybody for this for 2 weeks then he went to an urgent care center.  MRI was done on 2/18 which showed cervical myelopathy and neuroforaminal stenosis. He was seen on 2/22 by DR [facility] who recommended 2-level ACDF.  He was taken off of work and was taking prn flexeril, robaxin and ultram.  He then presented to ED on 05/24/17 with decreased sensation of arms and legs and severe weakness off the limbs as well.  MRI done in ED showed no sig change from prior except for vertebral artery dissection and patient was admitted.  Orthopedic, neurology and vascular were consulted.  CT angio did not show vertebral art dissection and pt was seen by vascular surgeon who stated no vascular issues.  Pt was receiving MSO4 for pain.  Then in hospital he had an unresponsive episode lasting about 1 minute, f/u by neuro and EEG done , pt declared to be stable for surgery. Patient was taken to OR 2/28 by Dr Lynann Bologna and underwent ACDF C 5/6 and C 6/7.  Had minimal blood loss and complications intra-op.    Progress Note Subjective: pt feeling well, but had syncopal episode after getting OOB and attempting to walk with a walker.  Not making much urine today.  Is getting IVF"s now.    Exam: Neck brace in place No distress, calm CV: S1, S2 present Lungs: CTA Abd: soft, NT, ND, BS+ Ext: No pedal edema b/l Neuro: no significant focal strength deficit  Present on Admission: - Left sided numbness - Radicular pain of left lower extremity - Cervical myelopathy  Assessment:  1)  L leg numbness/ cervical myelopathy - related to injury at work on 1/27.  SP ACDF of C5/6 and C6/7 on 05/26/17 by Dr Lynann Bologna.  Numbness is mostly resolved now.  Some mild LLE weakness.  - per orthopedics     - up with PT/OT, encourage ambulation                - Tylenol for pain,  Valium for muscle spasms                - orthopedics signed off,  f/u in 2 weeks  2)  Syncope - today while attempting to walk first time post-op. Suspect he is dehydrated.  ECHO done preop showed EF 50-55%, mild diffuse hypokinesis, no focal WMA. No CP.  He is getting 2 L NS bolus and will order another 2 L to run in overnight.  Stay in bed this afternoon, is to get up carefully w/ orthostatic BP checks first before attempting to walk again.   3)  LOC episode- none further, this happened pre-op, suspect this was related to sedating pain meds  4)  Dispo - home in 24-48 hrs when stronger  DVT prophylaxis: 1/2 dose eliquis started back postop by ortho Dispo: as above Code status: full    Kelly Splinter MD [facility] pgr [phone] 05/27/2017, 1:35 PM

## 2017-05-27 NOTE — Progress Notes (Signed)
    Patient doing well  Pain is minimal Right arm pain resolved   Physical Exam: Vitals:   05/27/17 0011 05/27/17 0400  BP: (!) 112/55 108/77  Pulse:  71  Resp:  16  Temp: 98.7 F (37.1 C) 97.6 F (36.4 C)  SpO2: 94% 100%    Dressing in place NVI  POD #1 s/p C5-7 ACDF, doing well  - up with PT/OT, encourage ambulation - Tylenol for pain, Valium for muscle spasms - from orthopedic standpoint, may be discharged home today with f/u in 2 weeks

## 2017-05-28 LAB — VITAMIN B12: VITAMIN B 12: 303 pg/mL (ref 180–914)

## 2017-05-28 LAB — CORTISOL: CORTISOL PLASMA: 6.5 ug/dL

## 2017-05-28 MED ORDER — SODIUM CHLORIDE 0.9 % IV SOLN
INTRAVENOUS | Status: DC
Start: 2017-05-28 — End: 2017-05-29
  Administered 2017-05-28: 15:00:00 via INTRAVENOUS
  Administered 2017-05-29: 100 mL/h via INTRAVENOUS

## 2017-05-28 MED ORDER — SENNOSIDES-DOCUSATE SODIUM 8.6-50 MG PO TABS: 1.0000 | ORAL_TABLET | Freq: Every evening | ORAL | 0 refills | Status: DC | PRN

## 2017-05-28 MED ORDER — SODIUM CHLORIDE 0.9 % IV BOLUS (SEPSIS)
1000.0000 mL | Freq: Once | INTRAVENOUS | Status: AC
  Administered 2017-05-28: 1000 mL via INTRAVENOUS

## 2017-05-28 MED ORDER — ACETAMINOPHEN 325 MG PO TABS: 650.0000 mg | ORAL_TABLET | Freq: Four times a day (QID) | ORAL | 3 refills | Status: DC | PRN

## 2017-05-28 MED ORDER — UNABLE TO FIND: 0 refills | Status: DC

## 2017-05-28 NOTE — Progress Notes (Signed)
Occupational Therapy Evaluation Patient Details Name: Zachary Nash MRN: [id number] DOB: 03-Dec-1970 Today's Date: 05/28/2017    History of Present Illness 47 yo admitted for C5-7 ACDF with PMHx: GERD, anxiety   Clinical Impression   PTA, pt independent with ADL and mobility. Pt ambulated to bathroom and was completing ADL at sink level when he notified therapist of not feeling well/light headed/nauseated. Pt with near syncopal event (BP 69/44). Pt diaphoretic. BP sitting (after ambulating to sink) 122/82; standing 69/44; pivoted to recliner - sitting 98/74; sitting (5 min later) 125/79. Nursing notified. Pt not appropriate for DC home at this time. Recommend nsg try TED hose/discussed event and possible relation to pain meds. Pt overall mod A with ADL @ RW level due to below listed deficits. Recommend follow up with HHOT once medically stable. Will need 3in1 and [place] collar for home. Discussed orders with PA who states pt is to wear Asp[place] collar at all times and may switch out to phil[place] collar for showers.     Follow Up Recommendations  Home health OT;Supervision - Intermittent    Equipment Recommendations  3 in 1 bedside commode    Recommendations for Other Services       Precautions / Restrictions Precautions Precautions: Fall;Cervical Precaution Comments: watch BP Required Braces or Orthoses: Cervical Brace Cervical Brace: At all times;Hard collar; [place] collar for showers Restrictions Weight Bearing Restrictions: No      Mobility Bed Mobility Overal bed mobility: Needs Assistance Bed Mobility: Rolling;Sidelying to Sit   Sidelying to sit: Supervision          Transfers Overall transfer level: Needs assistance   Transfers: Sit to/from Stand Sit to Stand: Min assist         General transfer comment: cues for hand placement and safety    Balance Overall balance assessment: Needs assistance   Sitting balance-Leahy Scale: Good        Standing balance-Leahy Scale: Fair                             ADL either performed or assessed with clinical judgement   ADL Overall ADL's : Needs assistance/impaired Eating/Feeding: Set up   Grooming: Minimal assistance;Sitting   Upper Body Bathing: Minimal assistance;Sitting   Lower Body Bathing: Moderate assistance;Sit to/from stand   Upper Body Dressing : Moderate assistance   Lower Body Dressing: Moderate assistance;Sit to/from stand   Toilet Transfer: Minimal assistance;RW;BSC   Toileting- Water quality scientist and Hygiene: Moderate assistance Toileting - Clothing Manipulation Details (indicate cue type and reason): unable to reach bottom; may need toilet tongs     Functional mobility during ADLs: Minimal assistance;Rolling walker;Cueing for safety General ADL Comments: Pt unable to cross feet over knees due to pain and LLE weakness     Vision Baseline Vision/History: Wears glasses       Perception     Praxis      Pertinent Vitals/Pain Pain Assessment: 0-10 Pain Score: 5  Pain Location: incision/neck Pain Descriptors / Indicators: Aching;Discomfort Pain Intervention(s): Limited activity within patient's tolerance;Premedicated before session     Hand Dominance Right   Extremity/Trunk Assessment Upper Extremity Assessment Upper Extremity Assessment: LUE deficits/detail LUE Deficits / Details: grip and pinch strength @3 +/5 throughout; difficulty with opposition - unable o oppose thumb to little finger LUE: Unable to fully assess due to pain;Unable to fully assess due to immobilization(did not have pt reach shoulder aboe 90FF) LUE Sensation: decreased light touch LUE Coordination:  decreased fine motor;decreased gross motor   Lower Extremity Assessment Lower Extremity Assessment: Defer to PT evaluation   Cervical / Trunk Assessment Cervical / Trunk Assessment: Other exceptions Cervical / Trunk Exceptions: post surgical. Unable to fully assess  secondary to syncope   Communication Communication Communication: No difficulties   Cognition Arousal/Alertness: Awake/alert Behavior During Therapy: WFL for tasks assessed/performed Overall Cognitive Status: Within Functional Limits for tasks assessed                                     General Comments       Exercises Exercises: Other exercises Other Exercises Other Exercises: began theraputty ecercises   Shoulder Instructions      Home Living Family/patient expects to be discharged to:: Private residence Living Arrangements: Alone Available Help at Discharge: Family;Available PRN/intermittently Type of Home: House Home Access: Stairs to enter [employer] of Steps: 3 Entrance Stairs-Rails: Right Home Layout: Two level Alternate Level Stairs-Number of Steps: 10 Alternate Level Stairs-Rails: Right Bathroom Shower/Tub: Teacher, early years/pre: Standard Bathroom Accessibility: Yes How Accessible: Accessible via walker Home Equipment: None          Prior Functioning/Environment Level of Independence: Independent        Comments: drives a truck long haul        OT Problem List: Decreased strength;Decreased range of motion;Decreased activity tolerance;Impaired balance (sitting and/or standing);Decreased coordination;Decreased knowledge of use of DME or AE;Decreased knowledge of precautions;Impaired sensation;Impaired UE functional use;Pain      OT Treatment/Interventions: Self-care/ADL training    OT Goals(Current goals can be found in the care plan section) Acute Rehab OT Goals Patient Stated Goal: return home OT Goal Formulation: With patient Time For Goal Achievement: 06/11/17 Potential to Achieve Goals: Good  OT Frequency: Min 3X/week   Barriers to D/C:            Co-evaluation              AM-PAC PT "6 Clicks" Daily Activity     Outcome Measure Help from another person eating meals?: A Little Help from  another person taking care of personal grooming?: A Little Help from another person toileting, which includes using toliet, bedpan, or urinal?: A Lot Help from another person bathing (including washing, rinsing, drying)?: A Lot Help from another person to put on and taking off regular upper body clothing?: A Lot Help from another person to put on and taking off regular lower body clothing?: A Lot 6 Click Score: 14   End of Session Equipment Utilized During Treatment: Gait belt;Rolling walker;Cervical collar Nurse Communication: Other (comment)(medical status)  Activity Tolerance: Treatment limited secondary to medical complications (Comment)(near syncopal event) Patient left: in chair;with call bell/phone within reach;with nursing/sitter in room  OT Visit Diagnosis: Unsteadiness on feet (R26.81);Muscle weakness (generalized) (M62.81);Dizziness and giddiness (R42);Pain Pain - Right/Left: Left Pain - part of body: Arm;Leg                Time: 1240-1320 OT Time Calculation (min): 40 min Charges:  OT General Charges $OT Visit: 1 Visit OT Evaluation $OT Eval Moderate Complexity: 1 Mod OT Treatments $Self Care/Home Management : 23-37 mins G-Codes:     [facility], OT/L  [phone] 05/28/2017  Balthazar Dooly,HILLARY 05/28/2017, 1:45 PM

## 2017-05-28 NOTE — Progress Notes (Addendum)
1900 Bedside shift report, pt resting in bed, visiting with friend and eating dinner. NAD, no complaints at this time. Fall precautions in place, [facility].   2000 Pt assessed, wearing aspen collar, on and aligned. Pt able to move all 4 extremities, BLE x2, weak. Pt still c/o of constipation, will give laxative with PM meds. Updated with POC, WCTM.   2100 Medicated per MAR, pt c/o IV hurting, new IV inserted. Pt repositioned, condom cath reapplied, and SCDs removed per pt request. Fall precautions in place, [facility].   2335 RN to room, pt states he, "just can't get comfortable". VSS, Medicated for pain. WCTM.

## 2017-05-28 NOTE — Progress Notes (Signed)
Physical Therapy Treatment Patient Details Name: Zachary Nash MRN: [id number] DOB: 03-01-1971 Today's Date: 05/28/2017    History of Present Illness 47 yo admitted for C5-7 ACDF with PMHx: GERD, anxiety    PT Comments    Pt with increased activity, progression and mobility today. Pt with 3/5 hamstring and quad strength LLE with decreased sensation LLE compared to RLE. Pt able to walk, perform stairs and all mobility necessary for D/C home. Pt educated for precautions and recommend continued mobility with nursing. Will follow.    Orthostatic BPs  Supine 124/90  Sitting 120/96     Standing 128/92         Follow Up Recommendations  Supervision/Assistance - 24 hour;Outpatient PT     Equipment Recommendations  Rolling walker with 5" wheels;3in1 (PT)    Recommendations for Other Services       Precautions / Restrictions Precautions Precautions: Fall;Cervical Required Braces or Orthoses: Cervical Brace Cervical Brace: At all times;Hard collar    Mobility  Bed Mobility Overal bed mobility: Modified Independent Bed Mobility: Rolling;Sidelying to Sit           General bed mobility comments: no cues needed with appropriate sequence  Transfers Overall transfer level: Needs assistance   Transfers: Sit to/from Stand Sit to Stand: Supervision         General transfer comment: cues for hand placement and safety  Ambulation/Gait Ambulation/Gait assistance: Supervision Ambulation Distance (Feet): 500 Feet Assistive device: Rolling walker (2 wheeled);None Gait Pattern/deviations: Step-through pattern;Decreased stride length   Gait velocity interpretation: Below normal speed for age/gender General Gait Details: pt walked 400' with RW with decreased speed and good stability and last 100' without RW with decreased speed    Stairs            Wheelchair Mobility    Modified Rankin (Stroke Patients Only)       Balance Overall balance assessment: No  apparent balance deficits (not formally assessed)                                          Cognition Arousal/Alertness: Awake/alert Behavior During Therapy: WFL for tasks assessed/performed Overall Cognitive Status: Within Functional Limits for tasks assessed                                        Exercises General Exercises - Lower Extremity Long Arc Quad: AROM;15 reps;Seated;Left    General Comments        Pertinent Vitals/Pain Pain Assessment: 0-10 Pain Score: 4  Pain Location: incision Pain Descriptors / Indicators: Aching Pain Intervention(s): Limited activity within patient's tolerance;Repositioned    Home Living                      Prior Function            PT Goals (current goals can now be found in the care plan section) Progress towards PT goals: Progressing toward goals    Frequency    Min 5X/week      PT Plan Discharge plan needs to be updated    Co-evaluation              AM-PAC PT "6 Clicks" Daily Activity  Outcome Measure  Difficulty turning over in bed (including adjusting bedclothes, sheets and blankets)?:  A Little Difficulty moving from lying on back to sitting on the side of the bed? : A Little Difficulty sitting down on and standing up from a chair with arms (e.g., wheelchair, bedside commode, etc,.)?: A Little Help needed moving to and from a bed to chair (including a wheelchair)?: A Little Help needed walking in hospital room?: A Little Help needed climbing 3-5 steps with a railing? : A Little 6 Click Score: 18    End of Session Equipment Utilized During Treatment: Gait belt;Cervical collar Activity Tolerance: Patient tolerated treatment well Patient left: in chair;with call bell/phone within reach Nurse Communication: Mobility status;Precautions PT Visit Diagnosis: Other abnormalities of gait and mobility (R26.89);Muscle weakness (generalized) (M62.81)     Time: 7342-8768 PT  Time Calculation (min) (ACUTE ONLY): 34 min  Charges:  $Gait Training: 8-22 mins $Therapeutic Activity: 8-22 mins                    G Codes:       Elwyn Reach, PT [phone]    [place] 05/28/2017, 8:07 AM

## 2017-05-28 NOTE — Progress Notes (Signed)
0100 Pt sleeping soundly, NAD.

## 2017-05-28 NOTE — Progress Notes (Signed)
Ext to Dr Tana Coast that patient experienced dizziness while in the bathroom while standing with blood pressure decrease. Waiting response.

## 2017-05-28 NOTE — Plan of Care (Signed)
I was notified that patient felt dizzy, clammy and low BP 69/44, orthostatic  -  hold discharge, placed on IV fluid bolus 1 L, then continue 100 mL an hour - will check cortisol, B12, B1 MRI brain on 2/25 was negative for any stroke.   Estill Cotta M.D. [facility] 05/28/2017, 1:45 PM  Pager: [phone]

## 2017-05-28 NOTE — Care Management Note (Signed)
Case Management Note Original note by: Erenest Rasher, RN 05/25/2017, 3:05 PM    Patient Details  Name: Zachary Nash MRN: [id number] Date of Birth: [birth year]  Subjective/Objective:        Severe stenosis of cervical spine, left sided numbness, pain            Action/Plan: NCM spoke to pt and parents at bedside. Pt states he lives at home alone but his parents are available to assist as needed. Pt states he does not have any DME at home. Waiting PT/OT evaluation. Will continue to follow for dc needs.    Expected Discharge Date:  05/28/17               Expected Discharge Plan:  [place]  In-House Referral:  NA  Discharge planning Services  CM Consult  Post Acute Care Choice:  Home Health Choice offered to:  Patient  DME Arranged:  3-N-1, Walker rolling DME Agency:  [place]:  PT, OT [facility] Agency:  [place]  Status of Service:  Completed, signed off  If discussed at [place] of Stay Meetings, dates discussed:    Additional Comments:  05/28/17 J. Tyaisha Cullom, RN, BSN PT/OT originally recommending OP follow up, but pt continues to have issues with hypotension and dizziness.  Recommendation is now for [facility] follow up, and pt agreeable.  Referral to [facility], per pt choice for [facility] and DME needs.    Reinaldo Raddle, RN, BSN  Trauma/Neuro ICU Case Manager [phone]

## 2017-05-28 NOTE — Discharge Summary (Signed)
Physician Discharge Summary   Patient ID: Zachary Nash MRN: [id number] DOB/AGE: 04-Apr-1970 47 y.o.  Admit date: 05/24/2017 Discharge date: 05/28/2017  Primary Care Physician:  Celene Squibb, MD   Recommendations for Outpatient Follow-up:  1. Follow up with PCP in 1-2 weeks 2. Please obtain BMP/CBC in one week  Home Health: Outpatient PT Equipment/Devices: Rolling walker, 3 in 1  Discharge Condition: stable  CODE STATUS: FULL  Diet recommendation: Heart healthy   Discharge Diagnoses:     Cervical myeloradiculopathy with cord compression . Left sided numbness . Radicular pain of left lower extremity   Syncopal episode   Consults: Orthopedics, Dr. Joan Flores    Allergies:   Allergies  Allergen Reactions  . Penicillins Anaphylaxis and Swelling    Has patient had a PCN reaction causing immediate rash, facial/tongue/throat swelling, SOB or lightheadedness with hypotension: yes Has patient had a PCN reaction causing severe rash involving mucus membranes or skin necrosis: yes Has patient had a PCN reaction that required hospitalization: no Has patient had a PCN reaction occurring within the last 10 years: no If all of the above answers are "NO", then may proceed with Cephalosporin use.     DISCHARGE MEDICATIONS: Allergies as of 05/28/2017      Reactions   Penicillins Anaphylaxis, Swelling   Has patient had a PCN reaction causing immediate rash, facial/tongue/throat swelling, SOB or lightheadedness with hypotension: yes Has patient had a PCN reaction causing severe rash involving mucus membranes or skin necrosis: yes Has patient had a PCN reaction that required hospitalization: no Has patient had a PCN reaction occurring within the last 10 years: no If all of the above answers are "NO", then may proceed with Cephalosporin use.      Medication List    STOP taking these medications   cyclobenzaprine 10 MG tablet Commonly known as:  FLEXERIL   traMADol 50 MG  tablet Commonly known as:  ULTRAM     TAKE these medications   acetaminophen 325 MG tablet Commonly known as:  TYLENOL Take 2 tablets (650 mg total) by mouth every 6 (six) hours as needed for mild pain (or Fever >/= 101).   albuterol 108 (90 Base) MCG/ACT inhaler Commonly known as:  PROVENTIL HFA;VENTOLIN HFA Inhale 2 puffs into the lungs every 6 (six) hours as needed for wheezing.   ALIVE MENS ENERGY PO Take 1 tablet by mouth daily.   diazepam 5 MG tablet Commonly known as:  VALIUM Take 1 tablet (5 mg total) by mouth every 6 (six) hours as needed for muscle spasms.   famotidine 20 MG tablet Commonly known as:  PEPCID Take 1 tablet (20 mg total) by mouth at bedtime.   methocarbamol 750 MG tablet Commonly known as:  ROBAXIN Take 750 mg by mouth every morning.   oxyCODONE-acetaminophen 5-325 MG tablet Commonly known as:  PERCOCET/ROXICET Take 1 tablet by mouth every 4 (four) hours as needed for moderate pain or severe pain.   pantoprazole 40 MG tablet Commonly known as:  PROTONIX Take 1 tablet (40 mg total) by mouth daily before breakfast.   senna-docusate 8.6-50 MG tablet Commonly known as:  Senokot-S Take 1 tablet by mouth at bedtime as needed for mild constipation. Over the counter   UNABLE TO FIND Outpatient physical therapy, occupational therapy   Diagnosis: Cervical myeloradiculopathy            Durable Medical Equipment  (From admission, onward)        Start     Ordered  05/28/17 1051  For home use only DME 3 n 1  Once     05/28/17 1051   05/28/17 1051  For home use only DME Walker rolling  Once    Comments:  5 inches wheels  Question:  Patient needs a walker to treat with the following condition  Answer:  Gait instability   05/28/17 1051       Brief H and P: For complete details please refer to admission H and P, but in brief Patient is a 47 year old male with a history of GERD, had a recent injury during work on January 27th while pulling  backwards on a truck release. Did not seek medical attention for 2 weeks then went to urgent care. An MRI on 2/18 showed cervical myelopathy and neural foraminal stenosis.He was seen on 2/22 by DR [facility] who recommended 2-level ACDF. He was taken off of work and was taking prn flexeril, robaxin and ultram. He then presented to [facility]24[facility]17 with decreased sensation of arms and legs and severe weakness off the limbs as well. MRI done in ED showed no sig change from prior except for vertebral artery dissection and patient was admitted. Orthopedic, neurology and vascular were consulted. CT angio did not show vertebral art dissection and pt was seenby vascularsurgeonwho stated no vascular issues.Then in hospital he had an unresponsive episode lasting about 1 minute, f/u by neuro and EEG done , ptdeclared to bestable for surgery. Patient was [facility] by Dr Lynann Bologna and underwent ACDF C 5/6 and C 6/7.    Hospital Course:  Left leg numbness, cervical myeloradiculopathy, spinal cord compression - Secondary to work injury on 1/27 - Orthopedics was consulted, patient underwent post anterior cervical decompression and fusion C5-C6 and C6-C7, anterior instrumentation C5-C7 on 2/27 - Feeling better, left-sided weakness improving - cleared by orthopedics, Dr. Joan Flores for discharge home - Patient was cleared by physical therapy evaluation, recommended outpatient PT, rolling walker, 3 in 1     Syncope, vasovagal   - patient had a syncopal episode postoperatively while attempting to walk first time with PT. - 2-D echo showed EF of 50-55% mild diffuse hypokinesis, no focal wall motion abnormalities. Patient had no dizziness, chest pain or shortness of breath at the time of the event - Patient received IV fluid hydration, orthostatics negativetoday - Patient did well with physical therapy and ambulated without difficulty  Patient will be discharged home in safe condition     Day of  Discharge S: Feels a lot better today  BP 113/87   Pulse 62   Temp 97.7 F (36.5 C) (Oral)   Resp 12   Ht 5' 11"  (1.803 m)   Wt 90.5 kg (199 lb 8.3 oz)   SpO2 100%   BMI 27.83 kg/m   Physical Exam: General: Alert and awake oriented x3 not in any acute distress. HEENT: anicteric sclera, pupils reactive to light and accommodation CVS: S1-S2 clear no murmur rubs or gallops Chest: clear to auscultation bilaterally, no wheezing rales or rhonchi Abdomen: soft nontender, nondistended, normal bowel sounds Extremities: no cyanosis, clubbing or edema noted bilaterally Neuro: left-sided weakness improving   The results of significant diagnostics from this hospitalization (including imaging, microbiology, ancillary and laboratory) are listed below for reference.      Procedures/Studies: 2-D echo 2/27 Study Conclusions  - Left ventricle: The cavity size was normal. Systolic function was   normal. The estimated ejection fraction was in the range of 50%   to 55%. Diffuse hypokinesis. Left  ventricular diastolic function   parameters were normal. - Aortic valve: Transvalvular velocity was within the normal range.   There was no stenosis. There was no regurgitation. - Mitral valve: Transvalvular velocity was within the normal range.   There was no evidence for stenosis. There was no regurgitation. - Right ventricle: The cavity size was normal. Wall thickness was   normal. Systolic function was normal. - Tricuspid valve: There was no regurgitation.   Ct Angio Head W Or Wo Contrast  Result Date: 05/24/2017 CLINICAL DATA:  Tingling and numbness in both lower extremities. Fall with neck injury in January. Suspected vertebral artery dissection seen on earlier MRI. EXAM: CT HEAD WITHOUT CONTRAST CT ANGIOGRAPHY OF THE NECK TECHNIQUE: Contiguous axial images were obtained from the base of the skull through the vertex without intravenous contrast. Multidetector CT imaging of the neck was  performed using the standard protocol during bolus administration of intravenous contrast. Multiplanar CT image reconstructions and MIPs were obtained to evaluate the vascular anatomy. Carotid stenosis measurements (when applicable) are obtained utilizing NASCET criteria, using the distal internal carotid diameter as the denominator. CONTRAST:  50 mL Isovue 370 COMPARISON:  CT head neck 01/29/2016 FINDINGS: CTA NECK FINDINGS Aortic arch: There is no calcific atherosclerosis of the aortic arch. There is no aneurysm, dissection or hemodynamically significant stenosis of the visualized ascending aorta and aortic arch. Conventional 3 vessel aortic branching pattern. The visualized proximal subclavian arteries are widely patent. Right carotid system: The right common carotid origin is widely patent. There is no common carotid or internal carotid artery dissection or aneurysm. No hemodynamically significant stenosis. Left carotid system: The left common carotid origin is widely patent. There is no common carotid or internal carotid artery dissection or aneurysm. No hemodynamically significant stenosis. Vertebral arteries: The vertebral system is left dominant. Both vertebral artery origins are normal. Both vertebral arteries are normal to their confluence with the basilar artery. The focal narrowing seen on the T2-weighted images of the earlier MRI is not confirmed here. Retrospectively, the right vertebral artery flow void is normal on the traditional spin echo T2-weighted sequence from the MRI. It is only on the high-resolution CUBE sequence that it is abnormal. Skeleton: There is no bony spinal canal stenosis. No lytic or blastic lesions. Other neck: The nasopharynx is clear. The oropharynx and hypopharynx are normal. The epiglottis is normal. The supraglottic larynx, glottis and subglottic larynx are normal. No retropharyngeal collection. The parapharyngeal spaces are preserved. The parotid and submandibular glands  are normal. No sialolithiasis or salivary ductal dilatation. The thyroid gland is normal. There is no cervical lymphadenopathy. Upper chest: No pneumothorax or pleural effusion. No nodules or masses. Review of the MIP images confirms the above findings CTA HEAD FINDINGS Anterior circulation: --Intracranial internal carotid arteries: Normal. --Anterior cerebral arteries: Normal. --Middle cerebral arteries: Normal. --Posterior communicating arteries: Absent bilaterally. Posterior circulation: --Posterior cerebral arteries: Normal. --Superior cerebellar arteries: Normal. --Basilar artery: Normal. --Anterior inferior cerebellar arteries: Normal. --Posterior inferior cerebellar arteries: Normal. Venous sinuses: As permitted by contrast timing, patent. Anatomic variants: None Delayed phase: No parenchymal contrast enhancement. Review of the MIP images confirms the above findings. IMPRESSION: 1. Normal CTA of the head and neck. 2. The severe narrowing of the right vertebral artery flow void seen on the same day MRI is not confirmed on this study. Retrospectively, the right vertebral abnormality was present only on the T2-weighted CUBE sequence and not on the traditional T2-weighted sequence. Thus, it is probably artifactual. Given the combination of all  these factors the right vertebral artery is considered normal without evidence of dissection. These results were called by telephone at the time of interpretation on 05/24/2017 at 9:05 pm to Dr. Noemi Chapel , who verbally acknowledged these results. Electronically Signed   By: Ulyses Jarred M.D.   On: 05/24/2017 21:05   Dg Cervical Spine 1 View  Result Date: 05/26/2017 CLINICAL DATA:  Cervical fusion EXAM: CERVICAL SPINE 1 VIEW COMPARISON:  None FLUOROSCOPY TIME:  10 seconds FINDINGS: Single intraoperative fluoroscopic spot image. Anterior cervical disc fusion from C5 through C7 with anterior plate and screw fixation and interbody cages in satisfactory position. No  hardware failure or complication. IMPRESSION: Interval ACDF at C5-7. Electronically Signed   By: Kathreen Devoid   On: 05/26/2017 13:58   Ct Angio Neck W And/or Wo Contrast  Result Date: 05/24/2017 CLINICAL DATA:  Tingling and numbness in both lower extremities. Fall with neck injury in January. Suspected vertebral artery dissection seen on earlier MRI. EXAM: CT HEAD WITHOUT CONTRAST CT ANGIOGRAPHY OF THE NECK TECHNIQUE: Contiguous axial images were obtained from the base of the skull through the vertex without intravenous contrast. Multidetector CT imaging of the neck was performed using the standard protocol during bolus administration of intravenous contrast. Multiplanar CT image reconstructions and MIPs were obtained to evaluate the vascular anatomy. Carotid stenosis measurements (when applicable) are obtained utilizing NASCET criteria, using the distal internal carotid diameter as the denominator. CONTRAST:  50 mL Isovue 370 COMPARISON:  CT head neck 01/29/2016 FINDINGS: CTA NECK FINDINGS Aortic arch: There is no calcific atherosclerosis of the aortic arch. There is no aneurysm, dissection or hemodynamically significant stenosis of the visualized ascending aorta and aortic arch. Conventional 3 vessel aortic branching pattern. The visualized proximal subclavian arteries are widely patent. Right carotid system: The right common carotid origin is widely patent. There is no common carotid or internal carotid artery dissection or aneurysm. No hemodynamically significant stenosis. Left carotid system: The left common carotid origin is widely patent. There is no common carotid or internal carotid artery dissection or aneurysm. No hemodynamically significant stenosis. Vertebral arteries: The vertebral system is left dominant. Both vertebral artery origins are normal. Both vertebral arteries are normal to their confluence with the basilar artery. The focal narrowing seen on the T2-weighted images of the earlier MRI is  not confirmed here. Retrospectively, the right vertebral artery flow void is normal on the traditional spin echo T2-weighted sequence from the MRI. It is only on the high-resolution CUBE sequence that it is abnormal. Skeleton: There is no bony spinal canal stenosis. No lytic or blastic lesions. Other neck: The nasopharynx is clear. The oropharynx and hypopharynx are normal. The epiglottis is normal. The supraglottic larynx, glottis and subglottic larynx are normal. No retropharyngeal collection. The parapharyngeal spaces are preserved. The parotid and submandibular glands are normal. No sialolithiasis or salivary ductal dilatation. The thyroid gland is normal. There is no cervical lymphadenopathy. Upper chest: No pneumothorax or pleural effusion. No nodules or masses. Review of the MIP images confirms the above findings CTA HEAD FINDINGS Anterior circulation: --Intracranial internal carotid arteries: Normal. --Anterior cerebral arteries: Normal. --Middle cerebral arteries: Normal. --Posterior communicating arteries: Absent bilaterally. Posterior circulation: --Posterior cerebral arteries: Normal. --Superior cerebellar arteries: Normal. --Basilar artery: Normal. --Anterior inferior cerebellar arteries: Normal. --Posterior inferior cerebellar arteries: Normal. Venous sinuses: As permitted by contrast timing, patent. Anatomic variants: None Delayed phase: No parenchymal contrast enhancement. Review of the MIP images confirms the above findings. IMPRESSION: 1. Normal CTA of the  head and neck. 2. The severe narrowing of the right vertebral artery flow void seen on the same day MRI is not confirmed on this study. Retrospectively, the right vertebral abnormality was present only on the T2-weighted CUBE sequence and not on the traditional T2-weighted sequence. Thus, it is probably artifactual. Given the combination of all these factors the right vertebral artery is considered normal without evidence of dissection. These  results were called by telephone at the time of interpretation on 05/24/2017 at 9:05 pm to Dr. Noemi Chapel , who verbally acknowledged these results. Electronically Signed   By: Ulyses Jarred M.D.   On: 05/24/2017 21:05   Mr Brain Wo Contrast  Result Date: 05/24/2017 CLINICAL DATA:  Acute neck pain with difficulty moving extremities. EXAM: MRI HEAD WITHOUT CONTRAST TECHNIQUE: Multiplanar, multiecho pulse sequences of the brain and surrounding structures were obtained without intravenous contrast. COMPARISON:  None. FINDINGS: Brain: Partially empty sella. There is no acute infarct or acute hemorrhage. No mass lesion, hydrocephalus, dural abnormality or extra-axial collection. The brain parenchymal signal is normal for the patient's age. No age-advanced or lobar predominant atrophy. No chronic microhemorrhage or superficial siderosis. Vascular: Major intracranial arterial and venous sinus flow voids are preserved. Skull and upper cervical spine: The visualized skull base, calvarium, upper cervical spine and extracranial soft tissues are normal. Sinuses/Orbits: No fluid levels or advanced mucosal thickening. No mastoid or middle ear effusion. Normal orbits. IMPRESSION: Normal brain MRI. Electronically Signed   By: Ulyses Jarred M.D.   On: 05/24/2017 22:38   Mr Cervical Spine Wo Contrast  Result Date: 05/24/2017 CLINICAL DATA:  Cervical spine injury January 27. EXAM: MRI CERVICAL AND LUMBAR SPINE WITHOUT CONTRAST TECHNIQUE: Multiplanar and multiecho pulse sequences of the cervical spine, to include the craniocervical junction and cervicothoracic junction, and lumbar spine, were obtained without intravenous contrast. COMPARISON:  Cervical spine MRI 05/17/2017 FINDINGS: MRI CERVICAL SPINE FINDINGS Alignment: Physiologic. Vertebrae: Hyperintense T2-weighted signal focus at the C6 vertebral body with low T1-weighted signal, probably a fat poor hemangioma. No acute fracture. No discitis-osteomyelitis. Cord: Normal  Posterior Fossa, vertebral arteries, paraspinal tissues: There is an acute dissection of the right vertebral artery. The artery is poorly visualized below the C4-5 level, but there is clearly abnormal T2-weighted signal surrounding and above this level, which is new compared to 05/17/2017. Disc levels: C1-C2: Normal. C2-C3: Normal disc space and facets. No spinal canal or neuroforaminal stenosis. C3-C4: Small disc osteophyte complex with mild bilateral foraminal narrowing. C4-C5: Small disc osteophyte complex with mild right and moderate left neural foraminal stenosis. C5-C6: Bilobed disc protrusion effaces the thecal sac and deforms the spinal cord, unchanged. No signal change within the spinal cord. There is severe spinal canal stenosis with mild right and severe left neural foraminal stenosis. C6-C7: Small disc osteophyte complex with mild spinal canal stenosis and mild right, moderate left foraminal stenosis. C7-T1: Normal disc space and facets. No spinal canal or neuroforaminal stenosis. MRI LUMBAR SPINE FINDINGS Segmentation:  Standard. Alignment:  Physiologic. Vertebrae:  No fracture, evidence of discitis, or bone lesion. Conus medullaris and cauda equina: Conus extends to the L2 level. Conus and cauda equina appear normal. Paraspinal and other soft tissues: The visualized vascular, retroperitoneal and paraspinal structures are normal. Disc levels: The T11-L4 levels are normal. L4-L5: Moderate facet hypertrophy with small central disc protrusion. No stenosis. L5-S1: Normal. IMPRESSION: 1. 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 The right vertebral artery origin and proximal V2  segment are incompletely visualized on this study. Brain MRI/MRA without contrast should be considered to assess for intracranial acute ischemia. CTA of the neck could provide further detail of the right vertebral artery, particularly its proximal portion, but this is not  necessary for confirmation of the dissection. 2. 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 Severe left neural foraminal stenosis also at this level. 3. No acute abnormality or significant stenosis of the lumbar spine. These results were called by telephone at the time of interpretation on 05/24/2017 at 7:58 pm to Dr. Noemi Chapel , who verbally acknowledged these results. Electronically Signed   By: Ulyses Jarred M.D.   On: 05/24/2017 20:05   Mr Cervical Spine Wo Contrast  Result Date: 05/17/2017 CLINICAL DATA:  Cervical radiculopathy. Neck pain radiating to the left arm. EXAM: MRI CERVICAL SPINE WITHOUT CONTRAST TECHNIQUE: Multiplanar, multisequence MR imaging of the cervical spine was performed. No intravenous contrast was administered. COMPARISON:  CT scan of the cervical spine dated 01/29/2016 FINDINGS: Alignment: Physiologic. Vertebrae: Small benign hemangioma in the C6 vertebral body. Cord: No mass lesion or myelopathy. The spinal cord is compressed at C5-6. Posterior Fossa, vertebral arteries, paraspinal tissues: Negative. Disc levels: C2-3: Normal disc. Minimal degenerative changes of the facet joints. C3-4: Small broad-based disc bulge without neural impingement. No foraminal stenosis. C4-5: Small broad-based disc osteophyte complex asymmetric to the left with encroachment upon both lateral recesses but without significant foraminal stenosis C5-6: Broad-based disc protrusion with accompanying osteophytes asymmetric to the left markedly compressing the spinal cord asymmetric to the left without focal myelopathy. Both lateral recesses are impinged upon, left greater than right. C6-7: Small broad-based disc osteophyte complex slightly asymmetric to the left moderate bilateral foraminal stenosis with encroachment on the left lateral recess best seen on images 23 and 24 of series 6. Narrowing of the AP dimension of the spinal canal without spinal cord  compression. C7-T1: Prominent uncinate spurs create severe right foraminal stenosis and minimal left foraminal stenosis. IMPRESSION: 1. Disc protrusion at C5-6 asymmetric to the left compressing the spinal cord asymmetric to the left and extending into the left lateral recess and left neural foramen. This should affect the left C6 nerve. 2. The protrusion at C6-7 extends into the right lateral recess and could affect the right C6 nerve. 3. Disc osteophyte complex at C4-5 to the right and left could affect either or both C5 nerves but more likely the left C5 nerve. 4. Bony foraminal stenosis at C7-T1 on the right. This could affect the right C8 nerve. Electronically Signed   By: Lorriane Shire M.D.   On: 05/17/2017 15:33   Mr Lumbar Spine Wo Contrast  Result Date: 05/24/2017 CLINICAL DATA:  Cervical spine injury January 27. EXAM: MRI CERVICAL AND LUMBAR SPINE WITHOUT CONTRAST TECHNIQUE: Multiplanar and multiecho pulse sequences of the cervical spine, to include the craniocervical junction and cervicothoracic junction, and lumbar spine, were obtained without intravenous contrast. COMPARISON:  Cervical spine MRI 05/17/2017 FINDINGS: MRI CERVICAL SPINE FINDINGS Alignment: Physiologic. Vertebrae: Hyperintense T2-weighted signal focus at the C6 vertebral body with low T1-weighted signal, probably a fat poor hemangioma. No acute fracture. No discitis-osteomyelitis. Cord: Normal Posterior Fossa, vertebral arteries, paraspinal tissues: There is an acute dissection of the right vertebral artery. The artery is poorly visualized below the C4-5 level, but there is clearly abnormal T2-weighted signal surrounding and above this level, which is new compared to 05/17/2017. Disc levels: C1-C2: Normal. C2-C3: Normal disc space and  facets. No spinal canal or neuroforaminal stenosis. C3-C4: Small disc osteophyte complex with mild bilateral foraminal narrowing. C4-C5: Small disc osteophyte complex with mild right and moderate left  neural foraminal stenosis. C5-C6: Bilobed disc protrusion effaces the thecal sac and deforms the spinal cord, unchanged. No signal change within the spinal cord. There is severe spinal canal stenosis with mild right and severe left neural foraminal stenosis. C6-C7: Small disc osteophyte complex with mild spinal canal stenosis and mild right, moderate left foraminal stenosis. C7-T1: Normal disc space and facets. No spinal canal or neuroforaminal stenosis. MRI LUMBAR SPINE FINDINGS Segmentation:  Standard. Alignment:  Physiologic. Vertebrae:  No fracture, evidence of discitis, or bone lesion. Conus medullaris and cauda equina: Conus extends to the L2 level. Conus and cauda equina appear normal. Paraspinal and other soft tissues: The visualized vascular, retroperitoneal and paraspinal structures are normal. Disc levels: The T11-L4 levels are normal. L4-L5: Moderate facet hypertrophy with small central disc protrusion. No stenosis. L5-S1: Normal. IMPRESSION: 1. 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 The right vertebral artery origin and proximal V2 segment are incompletely visualized on this study. Brain MRI/MRA without contrast should be considered to assess for intracranial acute ischemia. CTA of the neck could provide further detail of the right vertebral artery, particularly its proximal portion, but this is not necessary for confirmation of the dissection. 2. 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 Severe left neural foraminal stenosis also at this level. 3. No acute abnormality or significant stenosis of the lumbar spine. These results were called by telephone at the time of interpretation on 05/24/2017 at 7:58 pm to Dr. Noemi Chapel , who verbally acknowledged these results. Electronically Signed   By: Ulyses Jarred M.D.   On: 05/24/2017 20:05   Dg C-arm 1-60 Min  Result Date: 05/26/2017 CLINICAL DATA:   Cervical fusion EXAM: CERVICAL SPINE 1 VIEW COMPARISON:  None FLUOROSCOPY TIME:  10 seconds FINDINGS: Single intraoperative fluoroscopic spot image. Anterior cervical disc fusion from C5 through C7 with anterior plate and screw fixation and interbody cages in satisfactory position. No hardware failure or complication. IMPRESSION: Interval ACDF at C5-7. Electronically Signed   By: Kathreen Devoid   On: 05/26/2017 13:58       LAB RESULTS: Basic Metabolic Panel: Recent Labs  Lab 05/25/17 2343 05/26/17 0550 05/27/17 0822  NA  --  138 138  K  --  4.0 4.0  CL  --  109 106  CO2  --  21* 21*  GLUCOSE  --  97 113*  BUN  --  21* 20  CREATININE  --  1.24 1.39*  CALCIUM 8.2* 8.5* 9.1  MG 2.1  --   --    Liver Function Tests: Recent Labs  Lab 05/24/17 1740  AST 33  ALT 39  ALKPHOS 65  BILITOT 0.6  PROT 6.9  ALBUMIN 4.1   No results for input(s): LIPASE, AMYLASE in the last 168 hours. No results for input(s): AMMONIA in the last 168 hours. CBC: Recent Labs  Lab 05/26/17 0550 05/27/17 0822  WBC 6.7 12.9*  NEUTROABS 4.4  --   HGB 13.0 15.0  HCT 38.5* 43.2  MCV 85.7 84.4  PLT 176 220   Cardiac Enzymes: Recent Labs  Lab 05/26/17 0550  TROPONINI <0.03   BNP: Invalid input(s): POCBNP CBG: Recent Labs  Lab 05/26/17 1730 05/27/17 1618  GLUCAP 125* 92  Disposition and Follow-up: Discharge Instructions    Diet - low sodium heart healthy   Complete by:  As directed    Increase activity slowly   Complete by:  As directed        DISPOSITION: home    [facility]    Phylliss Bob, MD. Schedule an appointment as soon as possible for a visit in 1 week(s).   Specialty:  Orthopedic Surgery Contact information: [place] [address] [phone]        Celene Squibb, MD Follow up in 3 week(s).   Specialty:  Internal Medicine Contact information: [name]  [address] [phone]            Time coordinating discharge:  56mns   Signed:   REstill CottaM.D. [facility] 05/28/2017, 10:51 AM Pager: [phone]

## 2017-05-29 LAB — BASIC METABOLIC PANEL
ANION GAP: 9 (ref 5–15)
BUN: 12 mg/dL (ref 6–20)
CALCIUM: 8.4 mg/dL — AB (ref 8.9–10.3)
CHLORIDE: 110 mmol/L (ref 101–111)
CO2: 21 mmol/L — AB (ref 22–32)
Creatinine, Ser: 1.23 mg/dL (ref 0.61–1.24)
GFR calc Af Amer: >60 mL/min (ref >60)
GFR calc non Af Amer: >60 mL/min (ref >60)
GLUCOSE: 105 mg/dL — AB (ref 65–99)
POTASSIUM: 3.5 mmol/L (ref 3.5–5.1)
Sodium: 140 mmol/L (ref 135–145)

## 2017-05-29 LAB — CBC
HEMATOCRIT: 37.7 % — AB (ref 39.0–52.0)
Hemoglobin: 13.1 g/dL (ref 13.0–17.0)
MCH: 29.1 pg (ref 26.0–34.0)
MCHC: 34.7 g/dL (ref 30.0–36.0)
MCV: 83.8 fL (ref 78.0–100.0)
Platelets: 191 10*3/uL (ref 150–400)
RBC: 4.5 MIL/uL (ref 4.22–5.81)
RDW: 12.7 % (ref 11.5–15.5)
WBC: 6.8 10*3/uL (ref 4.0–10.5)

## 2017-05-29 NOTE — Progress Notes (Signed)
Physical Therapy Treatment Patient Details Name: Zachary Nash MRN: [id number] DOB: [birth year] Today's Date: 05/29/2017    History of Present Illness Pt is a 47 y/o male s/p C5-7 ACDF with PMHx: GERD, anxiety    PT Comments    Pt making good progress with functional mobility. All VSS throughout and pt asymptomatic throughout. Plan is for pt to d/c home today. Pt would continue to benefit from skilled physical therapy services at this time while admitted and after d/c to address the below listed limitations in order to improve overall safety and independence with functional mobility.    Follow Up Recommendations  Supervision/Assistance - 24 hour;Outpatient PT     Equipment Recommendations  Rolling walker with 5" wheels;3in1 (PT)    Recommendations for Other Services       Precautions / Restrictions Precautions Precautions: Fall;Cervical Precaution Comments: watch BP Required Braces or Orthoses: Cervical Brace Cervical Brace: At all times;Hard collar Restrictions Weight Bearing Restrictions: No    Mobility  Bed Mobility Overal bed mobility: Needs Assistance Bed Mobility: Rolling;Sidelying to Sit Rolling: Supervision Sidelying to sit: Supervision       General bed mobility comments: good log roll technique, supervision for safety  Transfers Overall transfer level: Needs assistance Equipment used: Rolling walker (2 wheeled) Transfers: Sit to/from Stand Sit to Stand: Min guard         General transfer comment: good technique, min guard for safety  Ambulation/Gait Ambulation/Gait assistance: Min guard Ambulation Distance (Feet): 200 Feet Assistive device: Rolling walker (2 wheeled) Gait Pattern/deviations: Step-through pattern;Decreased stride length Gait velocity: decreased Gait velocity interpretation: Below normal speed for age/gender General Gait Details: pt steady with use of RW; no dizziness or syncopal symptoms throughout; BP remained  stable   Stairs            Wheelchair Mobility    Modified Rankin (Stroke Patients Only)       Balance Overall balance assessment: Needs assistance Sitting-balance support: Feet supported Sitting balance-Leahy Scale: Good     Standing balance support: During functional activity;Bilateral upper extremity supported Standing balance-Leahy Scale: Poor                              Cognition Arousal/Alertness: Awake/alert Behavior During Therapy: WFL for tasks assessed/performed Overall Cognitive Status: Within Functional Limits for tasks assessed                                        Exercises      General Comments        Pertinent Vitals/Pain Pain Assessment: 0-10 Pain Score: 2  Pain Location: incision/neck Pain Descriptors / Indicators: Aching;Discomfort Pain Intervention(s): Monitored during session;Repositioned    Home Living                      Prior Function            PT Goals (current goals can now be found in the care plan section) Acute Rehab PT Goals PT Goal Formulation: With patient/family Time For Goal Achievement: 06/10/17 Potential to Achieve Goals: Good Progress towards PT goals: Progressing toward goals    Frequency    Min 5X/week      PT Plan Current plan remains appropriate    Co-evaluation              AM-PAC PT "  6 Clicks" Daily Activity  Outcome Measure  Difficulty turning over in bed (including adjusting bedclothes, sheets and blankets)?: None Difficulty moving from lying on back to sitting on the side of the bed? : None Difficulty sitting down on and standing up from a chair with arms (e.g., wheelchair, bedside commode, etc,.)?: Unable Help needed moving to and from a bed to chair (including a wheelchair)?: None Help needed walking in hospital room?: A Little Help needed climbing 3-5 steps with a railing? : A Little 6 Click Score: 19    End of Session Equipment Utilized  During Treatment: Gait belt;Cervical collar Activity Tolerance: Patient tolerated treatment well Patient left: in chair;with call bell/phone within reach;with family/visitor present Nurse Communication: Mobility status;Precautions PT Visit Diagnosis: Other abnormalities of gait and mobility (R26.89);Muscle weakness (generalized) (M62.81)     Time: 9509-3267 PT Time Calculation (min) (ACUTE ONLY): 26 min  Charges:  $Gait Training: 8-22 mins $Therapeutic Activity: 8-22 mins                    G Codes:       [place], [place], [place] 05/29/2017, 2:23 PM

## 2017-05-29 NOTE — Progress Notes (Addendum)
Discharge instructions reviewed with patient and father.  These included the following:  Discharge medications, prescriptions, follow up appointments, when to call the MD, incision care, activity restrictions, removal of and application of neck brace, cervical spine precautions, and importance of increased mobilization.  Patient discharged to private residence accompanied by father, who took him in a private vehicle.  Escorted to exit accompanied by two nurse techs.

## 2017-05-29 NOTE — Progress Notes (Signed)
0644- orthostatic vitals this morning:   Lying BP 120/92, Pulse 71 Sitting BP 120/89, Pulse 92 Standing at 0 minutes BP 122/85, Pulse 100 Standing at 3 minutes BP 126/100, Pulse 82

## 2017-05-29 NOTE — Progress Notes (Signed)
[facility]                                                                              Patient Demographics  Zachary Nash, is a 47 y.o. male, DOB - [birth year], KDX:[id number]  Admit date - 05/24/2017   Admitting Physician Rise Patience, MD  Outpatient Primary MD for the patient is Celene Squibb, MD  Outpatient specialists:   LOS - 4  days   Medical records reviewed and are as summarized below:    Chief Complaint  Patient presents with  . Numbness       Brief summary   For complete details please refer to admission H and P, but in brief Patient is a 47 year old male with a history of GERD, had a recent injury during work on January 27thwhile pulling backwards on a truck release.Did not seek medical attention for 2 weeks then went to urgent care. An MRI on 2/18 showed cervical myelopathy and neural foraminal stenosis.He was seen on 2/22 by DR [facility] who recommended 2-level ACDF. He was taken off of work and was taking prn flexeril, robaxin and ultram. He then presented to [facility] 05/24/17 with decreased sensation of arms and legs and severe weakness off the limbs as well. MRI done in ED showed no sig change from prior except for vertebral artery dissection and patient was admitted. Orthopedic, neurology and vascular were consulted. CT angio did not show vertebral art dissection and pt was seenby vascularsurgeonwho stated no vascular issues.Then in hospital he had an unresponsive episode lasting about 1 minute, f/u by neuro and EEG done , ptdeclared to bestable for surgery. [facility] Dr Lynann Bologna and underwent ACDF C 5/6 and C 6/7.   Assessment & Plan   Left leg numbness, cervical myeloradiculopathy, spinal cord compression -Secondary to work injury on 1/27 -Orthopedics was consulted, patient underwent post anterior cervical decompression and fusion C5-C6 and C6-C7, anterior instrumentation C5-C7 on 2/27 -cleared by  orthopedics, Dr. Joan Flores for discharge home - Patient was cleared by physical therapy evaluation, recommended rolling walker, 3 in 1 - ordered - discharge was held on 3/1 due to orthostasis and dizziness.  Patient received IV fluid hydration, feels much better today, orthostatic vitals negative.   Syncope, vasovagal - patient had a syncopal episode postoperatively while attempting to walk first time with PT. - 2-D echo showed EF of 50-55% mild diffuse hypokinesis, no focal wall motion abnormalities. Patient had no dizziness, chest pain or shortness of breath at the time of the event -Orthostatic vitals negative, patient received IV fluid hydration  Patient will be discharged home in safe condition, home health PT OT arranged by the case management    Code Status: full  DVT Prophylaxis:  SCD's Family Communication: Discussed in detail with the patient, all imaging results, lab results explained to the patient    Disposition Plan: DC home today. NO changes to DC summary except home health arranged  Time Spent in minutes  15 minutes  Procedures:  post anterior cervical decompression and fusion C5-C6 and C6-C7, anterior instrumentation C5-C7 on 2/27   Consultants:   ortho  Antimicrobials:  Medications  Scheduled Meds: . docusate sodium  100 mg Oral BID  . pantoprazole  40 mg Oral QHS   Continuous Infusions: . sodium chloride 100 mL/hr (05/29/17 0850)   PRN Meds:.acetaminophen **OR** acetaminophen, alum & mag hydroxide-simeth, bisacodyl, diazepam, menthol-cetylpyridinium **OR** phenol, oxyCODONE-acetaminophen, senna-docusate   Antibiotics   Anti-infectives (From admission, onward)   Start     Dose/Rate Route Frequency Ordered Stop   05/26/17 2200  vancomycin (VANCOCIN) IVPB 1000 mg/200 mL premix     1,000 mg 200 mL/hr over 60 Minutes Intravenous  Once 05/26/17 1507 05/26/17 2224   05/26/17 1015  vancomycin (VANCOCIN) IVPB 1000 mg/200 mL premix     1,000  mg 200 mL/hr over 60 Minutes Intravenous  Once 05/26/17 1008 05/26/17 1121        Subjective:   Asani Mcburney was seen and examined today.  Feels a lot better today.  No dizziness this morning. Patient denies dizziness, chest pain, shortness of breath, abdominal pain, N/V/D/C, new weakness, numbess, tingling. No acute events overnight.    Objective:   Vitals:   05/29/17 0000 05/29/17 0358 05/29/17 0400 05/29/17 0800  BP: 109/83  118/89   Pulse: 73  68   Resp: 15  10   Temp:  98.1 F (36.7 C)  98.6 F (37 C)  TempSrc:  Oral  Oral  SpO2: 91%  92%   Weight:      Height:        Intake/Output Summary (Last 24 hours) at 05/29/2017 1141 Last data filed at 05/29/2017 0850 Gross per 24 hour  Intake 4043.33 ml  Output 2425 ml  Net 1618.33 ml     Wt Readings from Last 3 Encounters:  05/27/17 90.5 kg (199 lb 8.3 oz)  04/20/17 88.5 kg (195 lb)  03/24/17 81.6 kg (180 lb)     Exam  General: Alert and oriented x 3, NAD  Eyes:   HEENT:  C collar on   Cardiovascular: S1 S2 auscultated, no rubs, murmurs or gallops. Regular rate and rhythm.  Respiratory: Clear to auscultation bilaterally, no wheezing, rales or rhonchi  Gastrointestinal: Soft, nontender, nondistended, + bowel sounds  Ext: no pedal edema bilaterally  Neuro: no new deficits  Musculoskeletal: No digital cyanosis, clubbing  Skin: No rashes  Psych: Normal affect and demeanor, alert and oriented x3    Data Reviewed:  I have personally reviewed following labs and imaging studies  Micro Results Recent Results (from the past 240 hour(s))  MRSA PCR Screening     Status: None   Collection Time: 05/25/17  5:09 AM  Result Value Ref Range Status   MRSA by PCR NEGATIVE NEGATIVE Final    Comment:        The GeneXpert MRSA Assay (FDA approved for NASAL specimens only), is one component of a comprehensive MRSA colonization surveillance program. It is not intended to diagnose MRSA infection nor to guide  or monitor treatment for MRSA infections. Performed at [facility], [place] [address], [place], [place] [address]   Surgical PCR screen     Status: None   Collection Time: 05/26/17 12:14 AM  Result Value Ref Range Status   MRSA, PCR NEGATIVE NEGATIVE Final   Staphylococcus aureus NEGATIVE NEGATIVE Final    Comment: (NOTE) The Xpert SA Assay (FDA approved for NASAL specimens in patients 47 years of age and older), is one component of a comprehensive surveillance program. It is not intended to diagnose infection nor to guide or monitor treatment. Performed at  [facility], [place] [address], [place],  [address]     Radiology Reports Ct Angio Head W Or Wo Contrast  Result Date: 05/24/2017 CLINICAL DATA:  Tingling and numbness in both lower extremities. Fall with neck injury in January. Suspected vertebral artery dissection seen on earlier MRI. EXAM: CT HEAD WITHOUT CONTRAST CT ANGIOGRAPHY OF THE NECK TECHNIQUE: Contiguous axial images were obtained from the base of the skull through the vertex without intravenous contrast. Multidetector CT imaging of the neck was performed using the standard protocol during bolus administration of intravenous contrast. Multiplanar CT image reconstructions and MIPs were obtained to evaluate the vascular anatomy. Carotid stenosis measurements (when applicable) are obtained utilizing NASCET criteria, using the distal internal carotid diameter as the denominator. CONTRAST:  50 mL Isovue 370 COMPARISON:  CT head neck 01/29/2016 FINDINGS: CTA NECK FINDINGS Aortic arch: There is no calcific atherosclerosis of the aortic arch. There is no aneurysm, dissection or hemodynamically significant stenosis of the visualized ascending aorta and aortic arch. Conventional 3 vessel aortic branching pattern. The visualized proximal subclavian arteries are widely patent. Right carotid system: The right common carotid origin is widely patent. There is no common carotid or  internal carotid artery dissection or aneurysm. No hemodynamically significant stenosis. Left carotid system: The left common carotid origin is widely patent. There is no common carotid or internal carotid artery dissection or aneurysm. No hemodynamically significant stenosis. Vertebral arteries: The vertebral system is left dominant. Both vertebral artery origins are normal. Both vertebral arteries are normal to their confluence with the basilar artery. The focal narrowing seen on the T2-weighted images of the earlier MRI is not confirmed here. Retrospectively, the right vertebral artery flow void is normal on the traditional spin echo T2-weighted sequence from the MRI. It is only on the high-resolution CUBE sequence that it is abnormal. Skeleton: There is no bony spinal canal stenosis. No lytic or blastic lesions. Other neck: The nasopharynx is clear. The oropharynx and hypopharynx are normal. The epiglottis is normal. The supraglottic larynx, glottis and subglottic larynx are normal. No retropharyngeal collection. The parapharyngeal spaces are preserved. The parotid and submandibular glands are normal. No sialolithiasis or salivary ductal dilatation. The thyroid gland is normal. There is no cervical lymphadenopathy. Upper chest: No pneumothorax or pleural effusion. No nodules or masses. Review of the MIP images confirms the above findings CTA HEAD FINDINGS Anterior circulation: --Intracranial internal carotid arteries: Normal. --Anterior cerebral arteries: Normal. --Middle cerebral arteries: Normal. --Posterior communicating arteries: Absent bilaterally. Posterior circulation: --Posterior cerebral arteries: Normal. --Superior cerebellar arteries: Normal. --Basilar artery: Normal. --Anterior inferior cerebellar arteries: Normal. --Posterior inferior cerebellar arteries: Normal. Venous sinuses: As permitted by contrast timing, patent. Anatomic variants: None Delayed phase: No parenchymal contrast enhancement.  Review of the MIP images confirms the above findings. IMPRESSION: 1. Normal CTA of the head and neck. 2. The severe narrowing of the right vertebral artery flow void seen on the same day MRI is not confirmed on this study. Retrospectively, the right vertebral abnormality was present only on the T2-weighted CUBE sequence and not on the traditional T2-weighted sequence. Thus, it is probably artifactual. Given the combination of all these factors the right vertebral artery is considered normal without evidence of dissection. These results were called by telephone at the time of interpretation on 05/24/2017 at 9:05 pm to Dr. Noemi Chapel , who verbally acknowledged these results. Electronically Signed   By: Ulyses Jarred M.D.   On: 05/24/2017 21:05   Dg Cervical Spine 1 View  Result Date: 05/26/2017 CLINICAL DATA:  Cervical fusion EXAM: CERVICAL SPINE 1 VIEW COMPARISON:  None FLUOROSCOPY TIME:  10 seconds FINDINGS: Single intraoperative fluoroscopic spot image. Anterior cervical disc fusion from C5 through C7 with anterior plate and screw fixation and interbody cages in satisfactory position. No hardware failure or complication. IMPRESSION: Interval ACDF at C5-7. Electronically Signed   By: Kathreen Devoid   On: 05/26/2017 13:58   Ct Angio Neck W And/or Wo Contrast  Result Date: 05/24/2017 CLINICAL DATA:  Tingling and numbness in both lower extremities. Fall with neck injury in January. Suspected vertebral artery dissection seen on earlier MRI. EXAM: CT HEAD WITHOUT CONTRAST CT ANGIOGRAPHY OF THE NECK TECHNIQUE: Contiguous axial images were obtained from the base of the skull through the vertex without intravenous contrast. Multidetector CT imaging of the neck was performed using the standard protocol during bolus administration of intravenous contrast. Multiplanar CT image reconstructions and MIPs were obtained to evaluate the vascular anatomy. Carotid stenosis measurements (when applicable) are obtained utilizing  NASCET criteria, using the distal internal carotid diameter as the denominator. CONTRAST:  50 mL Isovue 370 COMPARISON:  CT head neck 01/29/2016 FINDINGS: CTA NECK FINDINGS Aortic arch: There is no calcific atherosclerosis of the aortic arch. There is no aneurysm, dissection or hemodynamically significant stenosis of the visualized ascending aorta and aortic arch. Conventional 3 vessel aortic branching pattern. The visualized proximal subclavian arteries are widely patent. Right carotid system: The right common carotid origin is widely patent. There is no common carotid or internal carotid artery dissection or aneurysm. No hemodynamically significant stenosis. Left carotid system: The left common carotid origin is widely patent. There is no common carotid or internal carotid artery dissection or aneurysm. No hemodynamically significant stenosis. Vertebral arteries: The vertebral system is left dominant. Both vertebral artery origins are normal. Both vertebral arteries are normal to their confluence with the basilar artery. The focal narrowing seen on the T2-weighted images of the earlier MRI is not confirmed here. Retrospectively, the right vertebral artery flow void is normal on the traditional spin echo T2-weighted sequence from the MRI. It is only on the high-resolution CUBE sequence that it is abnormal. Skeleton: There is no bony spinal canal stenosis. No lytic or blastic lesions. Other neck: The nasopharynx is clear. The oropharynx and hypopharynx are normal. The epiglottis is normal. The supraglottic larynx, glottis and subglottic larynx are normal. No retropharyngeal collection. The parapharyngeal spaces are preserved. The parotid and submandibular glands are normal. No sialolithiasis or salivary ductal dilatation. The thyroid gland is normal. There is no cervical lymphadenopathy. Upper chest: No pneumothorax or pleural effusion. No nodules or masses. Review of the MIP images confirms the above findings CTA  HEAD FINDINGS Anterior circulation: --Intracranial internal carotid arteries: Normal. --Anterior cerebral arteries: Normal. --Middle cerebral arteries: Normal. --Posterior communicating arteries: Absent bilaterally. Posterior circulation: --Posterior cerebral arteries: Normal. --Superior cerebellar arteries: Normal. --Basilar artery: Normal. --Anterior inferior cerebellar arteries: Normal. --Posterior inferior cerebellar arteries: Normal. Venous sinuses: As permitted by contrast timing, patent. Anatomic variants: None Delayed phase: No parenchymal contrast enhancement. Review of the MIP images confirms the above findings. IMPRESSION: 1. Normal CTA of the head and neck. 2. The severe narrowing of the right vertebral artery flow void seen on the same day MRI is not confirmed on this study. Retrospectively, the right vertebral abnormality was present only on the T2-weighted CUBE sequence and not on the traditional T2-weighted sequence. Thus, it is probably artifactual. Given the combination of all these factors the right vertebral  artery is considered normal without evidence of dissection. These results were called by telephone at the time of interpretation on 05/24/2017 at 9:05 pm to Dr. Noemi Chapel , who verbally acknowledged these results. Electronically Signed   By: Ulyses Jarred M.D.   On: 05/24/2017 21:05   Mr Brain Wo Contrast  Result Date: 05/24/2017 CLINICAL DATA:  Acute neck pain with difficulty moving extremities. EXAM: MRI HEAD WITHOUT CONTRAST TECHNIQUE: Multiplanar, multiecho pulse sequences of the brain and surrounding structures were obtained without intravenous contrast. COMPARISON:  None. FINDINGS: Brain: Partially empty sella. There is no acute infarct or acute hemorrhage. No mass lesion, hydrocephalus, dural abnormality or extra-axial collection. The brain parenchymal signal is normal for the patient's age. No age-advanced or lobar predominant atrophy. No chronic microhemorrhage or superficial  siderosis. Vascular: Major intracranial arterial and venous sinus flow voids are preserved. Skull and upper cervical spine: The visualized skull base, calvarium, upper cervical spine and extracranial soft tissues are normal. Sinuses/Orbits: No fluid levels or advanced mucosal thickening. No mastoid or middle ear effusion. Normal orbits. IMPRESSION: Normal brain MRI. Electronically Signed   By: Ulyses Jarred M.D.   On: 05/24/2017 22:38   Mr Cervical Spine Wo Contrast  Result Date: 05/24/2017 CLINICAL DATA:  Cervical spine injury January 27. EXAM: MRI CERVICAL AND LUMBAR SPINE WITHOUT CONTRAST TECHNIQUE: Multiplanar and multiecho pulse sequences of the cervical spine, to include the craniocervical junction and cervicothoracic junction, and lumbar spine, were obtained without intravenous contrast. COMPARISON:  Cervical spine MRI 05/17/2017 FINDINGS: MRI CERVICAL SPINE FINDINGS Alignment: Physiologic. Vertebrae: Hyperintense T2-weighted signal focus at the C6 vertebral body with low T1-weighted signal, probably a fat poor hemangioma. No acute fracture. No discitis-osteomyelitis. Cord: Normal Posterior Fossa, vertebral arteries, paraspinal tissues: There is an acute dissection of the right vertebral artery. The artery is poorly visualized below the C4-5 level, but there is clearly abnormal T2-weighted signal surrounding and above this level, which is new compared to 05/17/2017. Disc levels: C1-C2: Normal. C2-C3: Normal disc space and facets. No spinal canal or neuroforaminal stenosis. C3-C4: Small disc osteophyte complex with mild bilateral foraminal narrowing. C4-C5: Small disc osteophyte complex with mild right and moderate left neural foraminal stenosis. C5-C6: Bilobed disc protrusion effaces the thecal sac and deforms the spinal cord, unchanged. No signal change within the spinal cord. There is severe spinal canal stenosis with mild right and severe left neural foraminal stenosis. C6-C7: Small disc osteophyte  complex with mild spinal canal stenosis and mild right, moderate left foraminal stenosis. C7-T1: Normal disc space and facets. No spinal canal or neuroforaminal stenosis. MRI LUMBAR SPINE FINDINGS Segmentation:  Standard. Alignment:  Physiologic. Vertebrae:  No fracture, evidence of discitis, or bone lesion. Conus medullaris and cauda equina: Conus extends to the L2 level. Conus and cauda equina appear normal. Paraspinal and other soft tissues: The visualized vascular, retroperitoneal and paraspinal structures are normal. Disc levels: The T11-L4 levels are normal. L4-L5: Moderate facet hypertrophy with small central disc protrusion. No stenosis. L5-S1: Normal. IMPRESSION: 1. 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 The right vertebral artery origin and proximal V2 segment are incompletely visualized on this study. Brain MRI/MRA without contrast should be considered to assess for intracranial acute ischemia. CTA of the neck could provide further detail of the right vertebral artery, particularly its proximal portion, but this is not necessary for confirmation of the dissection. 2. Unchanged severe spinal canal stenosis at C5-C6 with cord deformity but no signal  change. Severe left neural foraminal stenosis also at this level. 3. No acute abnormality or significant stenosis of the lumbar spine. These results were called by telephone at the time of interpretation on 05/24/2017 at 7:58 pm to Dr. Noemi Chapel , who verbally acknowledged these results. Electronically Signed   By: Ulyses Jarred M.D.   On: 05/24/2017 20:05   Mr Cervical Spine Wo Contrast  Result Date: 05/17/2017 CLINICAL DATA:  Cervical radiculopathy. Neck pain radiating to the left arm. EXAM: MRI CERVICAL SPINE WITHOUT CONTRAST TECHNIQUE: Multiplanar, multisequence MR imaging of the cervical spine was performed. No intravenous contrast was administered. COMPARISON:  CT scan of the cervical spine  dated 01/29/2016 FINDINGS: Alignment: Physiologic. Vertebrae: Small benign hemangioma in the C6 vertebral body. Cord: No mass lesion or myelopathy. The spinal cord is compressed at C5-6. Posterior Fossa, vertebral arteries, paraspinal tissues: Negative. Disc levels: C2-3: Normal disc. Minimal degenerative changes of the facet joints. C3-4: Small broad-based disc bulge without neural impingement. No foraminal stenosis. C4-5: Small broad-based disc osteophyte complex asymmetric to the left with encroachment upon both lateral recesses but without significant foraminal stenosis C5-6: Broad-based disc protrusion with accompanying osteophytes asymmetric to the left markedly compressing the spinal cord asymmetric to the left without focal myelopathy. Both lateral recesses are impinged upon, left greater than right. C6-7: Small broad-based disc osteophyte complex slightly asymmetric to the left moderate bilateral foraminal stenosis with encroachment on the left lateral recess best seen on images 23 and 24 of series 6. Narrowing of the AP dimension of the spinal canal without spinal cord compression. C7-T1: Prominent uncinate spurs create severe right foraminal stenosis and minimal left foraminal stenosis. IMPRESSION: 1. Disc protrusion at C5-6 asymmetric to the left compressing the spinal cord asymmetric to the left and extending into the left lateral recess and left neural foramen. This should affect the left C6 nerve. 2. The protrusion at C6-7 extends into the right lateral recess and could affect the right C6 nerve. 3. Disc osteophyte complex at C4-5 to the right and left could affect either or both C5 nerves but more likely the left C5 nerve. 4. Bony foraminal stenosis at C7-T1 on the right. This could affect the right C8 nerve. Electronically Signed   By: Lorriane Shire M.D.   On: 05/17/2017 15:33   Mr Lumbar Spine Wo Contrast  Result Date: 05/24/2017 CLINICAL DATA:  Cervical spine injury January 27. EXAM: MRI  CERVICAL AND LUMBAR SPINE WITHOUT CONTRAST TECHNIQUE: Multiplanar and multiecho pulse sequences of the cervical spine, to include the craniocervical junction and cervicothoracic junction, and lumbar spine, were obtained without intravenous contrast. COMPARISON:  Cervical spine MRI 05/17/2017 FINDINGS: MRI CERVICAL SPINE FINDINGS Alignment: Physiologic. Vertebrae: Hyperintense T2-weighted signal focus at the C6 vertebral body with low T1-weighted signal, probably a fat poor hemangioma. No acute fracture. No discitis-osteomyelitis. Cord: Normal Posterior Fossa, vertebral arteries, paraspinal tissues: There is an acute dissection of the right vertebral artery. The artery is poorly visualized below the C4-5 level, but there is clearly abnormal T2-weighted signal surrounding and above this level, which is new compared to 05/17/2017. Disc levels: C1-C2: Normal. C2-C3: Normal disc space and facets. No spinal canal or neuroforaminal stenosis. C3-C4: Small disc osteophyte complex with mild bilateral foraminal narrowing. C4-C5: Small disc osteophyte complex with mild right and moderate left neural foraminal stenosis. C5-C6: Bilobed disc protrusion effaces the thecal sac and deforms the spinal cord, unchanged. No signal change within the spinal cord. There is severe spinal canal stenosis with mild right  and severe left neural foraminal stenosis. C6-C7: Small disc osteophyte complex with mild spinal canal stenosis and mild right, moderate left foraminal stenosis. C7-T1: Normal disc space and facets. No spinal canal or neuroforaminal stenosis. MRI LUMBAR SPINE FINDINGS Segmentation:  Standard. Alignment:  Physiologic. Vertebrae:  No fracture, evidence of discitis, or bone lesion. Conus medullaris and cauda equina: Conus extends to the L2 level. Conus and cauda equina appear normal. Paraspinal and other soft tissues: The visualized vascular, retroperitoneal and paraspinal structures are normal. Disc levels: The T11-L4 levels are  normal. L4-L5: Moderate facet hypertrophy with small central disc protrusion. No stenosis. L5-S1: Normal. IMPRESSION: 1. 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 The right vertebral artery origin and proximal V2 segment are incompletely visualized on this study. Brain MRI/MRA without contrast should be considered to assess for intracranial acute ischemia. CTA of the neck could provide further detail of the right vertebral artery, particularly its proximal portion, but this is not necessary for confirmation of the dissection. 2. 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 Severe left neural foraminal stenosis also at this level. 3. No acute abnormality or significant stenosis of the lumbar spine. These results were called by telephone at the time of interpretation on 05/24/2017 at 7:58 pm to Dr. Noemi Chapel , who verbally acknowledged these results. Electronically Signed   By: Ulyses Jarred M.D.   On: 05/24/2017 20:05   Dg C-arm 1-60 Min  Result Date: 05/26/2017 CLINICAL DATA:  Cervical fusion EXAM: CERVICAL SPINE 1 VIEW COMPARISON:  None FLUOROSCOPY TIME:  10 seconds FINDINGS: Single intraoperative fluoroscopic spot image. Anterior cervical disc fusion from C5 through C7 with anterior plate and screw fixation and interbody cages in satisfactory position. No hardware failure or complication. IMPRESSION: Interval ACDF at C5-7. Electronically Signed   By: Kathreen Devoid   On: 05/26/2017 13:58    Lab Data:  CBC: Recent Labs  Lab 05/24/17 1740 05/24/17 1746 05/25/17 0346 05/26/17 0550 05/27/17 0822 05/29/17 0252  WBC 6.9  --  8.3 6.7 12.9* 6.8  NEUTROABS 3.9  --   --  4.4  --   --   HGB 16.4 16.0 15.3 13.0 15.0 13.1  HCT 44.7 47.0 43.3 38.5* 43.2 37.7*  MCV 82.9  --  83.6 85.7 84.4 83.8  PLT 218  --  185 176 220 196   Basic Metabolic Panel: Recent Labs  Lab 05/24/17 1740 05/24/17 1746  05/25/17 0346 05/25/17 2343 05/26/17 0550 05/27/17 0822 05/29/17 0252  NA 139 142 138  --  138 138 140  K 3.1* 3.0* 4.7  --  4.0 4.0 3.5  CL 109 108 111  --  109 106 110  CO2 19*  --  17*  --  21* 21* 21*  GLUCOSE 105* 98 178*  --  97 113* 105*  BUN 19 20 22*  --  21* 20 12  CREATININE 1.36* 1.30* 1.23  --  1.24 1.39* 1.23  CALCIUM 9.2  --  8.6* 8.2* 8.5* 9.1 8.4*  MG  --   --   --  2.1  --   --   --    GFR: Estimated Creatinine Clearance: 86.4 mL/min (by C-G formula based on SCr of 1.23 mg/dL). Liver Function Tests: Recent Labs  Lab 05/24/17 1740  AST 33  ALT 39  ALKPHOS 65  BILITOT 0.6  PROT 6.9  ALBUMIN 4.1   No results  for input(s): LIPASE, AMYLASE in the last 168 hours. No results for input(s): AMMONIA in the last 168 hours. Coagulation Profile: No results for input(s): INR, PROTIME in the last 168 hours. Cardiac Enzymes: Recent Labs  Lab 05/26/17 0550  TROPONINI <0.03   BNP (last 3 results) No results for input(s): PROBNP in the last 8760 hours. HbA1C: No results for input(s): HGBA1C in the last 72 hours. CBG: Recent Labs  Lab 05/25/17 1648 05/25/17 2118 05/26/17 0822 05/26/17 1730 05/27/17 1618  GLUCAP 95 142* 90 125* 92   Lipid Profile: No results for input(s): CHOL, HDL, LDLCALC, TRIG, CHOLHDL, LDLDIRECT in the last 72 hours. Thyroid Function Tests: No results for input(s): TSH, T4TOTAL, FREET4, T3FREE, THYROIDAB in the last 72 hours. Anemia Panel: Recent Labs    05/28/17 1427  VITAMINB12 303   Urine analysis:    Component Value Date/Time   COLORURINE YELLOW 04/20/2017 [place] 04/20/2017 1610   LABSPEC 1.009 04/20/2017 1610   PHURINE 7.0 04/20/2017 1610   GLUCOSEU NEGATIVE 04/20/2017 1610   HGBUR NEGATIVE 04/20/2017 1610   BILIRUBINUR NEGATIVE 04/20/2017 1610   KETONESUR 5 (A) 04/20/2017 1610   PROTEINUR NEGATIVE 04/20/2017 1610   UROBILINOGEN 0.2 12/10/2014 0210   NITRITE NEGATIVE 04/20/2017 1610   LEUKOCYTESUR  NEGATIVE 04/20/2017 1610     Artemus Romanoff M.D. [facility] 05/29/2017, 11:41 AM  Pager: [phone] Between 7am to 7pm - call Pager - [phone]  After 7pm go to [email or web address] - password [email or web address]  Call night coverage person covering after 7pm

## 2017-05-30 LAB — VITAMIN B1: Vitamin B1 (Thiamine): 105.7 nmol/L (ref 66.5–200.0)

## 2017-06-01 DIAGNOSIS — J45909 Unspecified asthma, uncomplicated: Secondary | ICD-10-CM | POA: Diagnosis not present

## 2017-06-01 DIAGNOSIS — Z79891 Long term (current) use of opiate analgesic: Secondary | ICD-10-CM | POA: Diagnosis not present

## 2017-06-01 DIAGNOSIS — Z981 Arthrodesis status: Secondary | ICD-10-CM | POA: Diagnosis not present

## 2017-06-01 DIAGNOSIS — Z4789 Encounter for other orthopedic aftercare: Secondary | ICD-10-CM | POA: Diagnosis not present

## 2017-06-01 DIAGNOSIS — E876 Hypokalemia: Secondary | ICD-10-CM | POA: Diagnosis not present

## 2017-06-01 DIAGNOSIS — K219 Gastro-esophageal reflux disease without esophagitis: Secondary | ICD-10-CM | POA: Diagnosis not present

## 2017-06-01 DIAGNOSIS — Z7951 Long term (current) use of inhaled steroids: Secondary | ICD-10-CM | POA: Diagnosis not present

## 2017-06-01 DIAGNOSIS — R944 Abnormal results of kidney function studies: Secondary | ICD-10-CM | POA: Diagnosis not present

## 2017-06-01 DIAGNOSIS — M4322 Fusion of spine, cervical region: Secondary | ICD-10-CM | POA: Diagnosis not present

## 2017-06-01 DIAGNOSIS — F419 Anxiety disorder, unspecified: Secondary | ICD-10-CM | POA: Diagnosis not present

## 2017-06-03 DIAGNOSIS — R10813 Right lower quadrant abdominal tenderness: Secondary | ICD-10-CM | POA: Diagnosis not present

## 2017-06-03 DIAGNOSIS — M4322 Fusion of spine, cervical region: Secondary | ICD-10-CM | POA: Diagnosis not present

## 2017-06-03 DIAGNOSIS — K219 Gastro-esophageal reflux disease without esophagitis: Secondary | ICD-10-CM | POA: Diagnosis not present

## 2017-06-03 DIAGNOSIS — E876 Hypokalemia: Secondary | ICD-10-CM | POA: Diagnosis not present

## 2017-06-03 DIAGNOSIS — Z6832 Body mass index (BMI) 32.0-32.9, adult: Secondary | ICD-10-CM | POA: Diagnosis not present

## 2017-06-03 DIAGNOSIS — R110 Nausea: Secondary | ICD-10-CM | POA: Diagnosis not present

## 2017-06-03 DIAGNOSIS — R944 Abnormal results of kidney function studies: Secondary | ICD-10-CM | POA: Diagnosis not present

## 2017-06-05 DIAGNOSIS — J45909 Unspecified asthma, uncomplicated: Secondary | ICD-10-CM | POA: Diagnosis not present

## 2017-06-05 DIAGNOSIS — Z7951 Long term (current) use of inhaled steroids: Secondary | ICD-10-CM | POA: Diagnosis not present

## 2017-06-05 DIAGNOSIS — F419 Anxiety disorder, unspecified: Secondary | ICD-10-CM | POA: Diagnosis not present

## 2017-06-05 DIAGNOSIS — K219 Gastro-esophageal reflux disease without esophagitis: Secondary | ICD-10-CM | POA: Diagnosis not present

## 2017-06-05 DIAGNOSIS — Z79891 Long term (current) use of opiate analgesic: Secondary | ICD-10-CM | POA: Diagnosis not present

## 2017-06-05 DIAGNOSIS — Z4789 Encounter for other orthopedic aftercare: Secondary | ICD-10-CM | POA: Diagnosis not present

## 2017-06-05 DIAGNOSIS — Z981 Arthrodesis status: Secondary | ICD-10-CM | POA: Diagnosis not present

## 2017-06-07 ENCOUNTER — Other Ambulatory Visit (HOSPITAL_COMMUNITY): Payer: Self-pay | Admitting: Internal Medicine

## 2017-06-07 ENCOUNTER — Ambulatory Visit (HOSPITAL_COMMUNITY)
Admission: RE | Admit: 2017-06-07 | Discharge: 2017-06-07 | Disposition: A | Discharge status: Home / Self Care | Payer: BLUE CROSS/BLUE SHIELD | Source: Ambulatory Visit | Attending: Internal Medicine | Admitting: Internal Medicine

## 2017-06-07 DIAGNOSIS — M79601 Pain in right arm: Secondary | ICD-10-CM

## 2017-06-07 DIAGNOSIS — I82611 Acute embolism and thrombosis of superficial veins of right upper extremity: Secondary | ICD-10-CM | POA: Insufficient documentation

## 2017-06-07 DIAGNOSIS — I808 Phlebitis and thrombophlebitis of other sites: Secondary | ICD-10-CM | POA: Diagnosis not present

## 2017-06-08 DIAGNOSIS — Z79891 Long term (current) use of opiate analgesic: Secondary | ICD-10-CM | POA: Diagnosis not present

## 2017-06-08 DIAGNOSIS — Z7951 Long term (current) use of inhaled steroids: Secondary | ICD-10-CM | POA: Diagnosis not present

## 2017-06-08 DIAGNOSIS — Z4789 Encounter for other orthopedic aftercare: Secondary | ICD-10-CM | POA: Diagnosis not present

## 2017-06-08 DIAGNOSIS — K219 Gastro-esophageal reflux disease without esophagitis: Secondary | ICD-10-CM | POA: Diagnosis not present

## 2017-06-08 DIAGNOSIS — J45909 Unspecified asthma, uncomplicated: Secondary | ICD-10-CM | POA: Diagnosis not present

## 2017-06-08 DIAGNOSIS — F419 Anxiety disorder, unspecified: Secondary | ICD-10-CM | POA: Diagnosis not present

## 2017-06-08 DIAGNOSIS — Z981 Arthrodesis status: Secondary | ICD-10-CM | POA: Diagnosis not present

## 2017-06-09 DIAGNOSIS — Z9889 Other specified postprocedural states: Secondary | ICD-10-CM | POA: Diagnosis not present

## 2017-06-09 DIAGNOSIS — G549 Nerve root and plexus disorder, unspecified: Secondary | ICD-10-CM | POA: Diagnosis not present

## 2017-06-11 DIAGNOSIS — K219 Gastro-esophageal reflux disease without esophagitis: Secondary | ICD-10-CM | POA: Diagnosis not present

## 2017-06-11 DIAGNOSIS — Z79891 Long term (current) use of opiate analgesic: Secondary | ICD-10-CM | POA: Diagnosis not present

## 2017-06-11 DIAGNOSIS — Z4789 Encounter for other orthopedic aftercare: Secondary | ICD-10-CM | POA: Diagnosis not present

## 2017-06-11 DIAGNOSIS — J45909 Unspecified asthma, uncomplicated: Secondary | ICD-10-CM | POA: Diagnosis not present

## 2017-06-11 DIAGNOSIS — Z981 Arthrodesis status: Secondary | ICD-10-CM | POA: Diagnosis not present

## 2017-06-11 DIAGNOSIS — F419 Anxiety disorder, unspecified: Secondary | ICD-10-CM | POA: Diagnosis not present

## 2017-06-11 DIAGNOSIS — Z7951 Long term (current) use of inhaled steroids: Secondary | ICD-10-CM | POA: Diagnosis not present

## 2017-06-17 DIAGNOSIS — Z981 Arthrodesis status: Secondary | ICD-10-CM | POA: Diagnosis not present

## 2017-06-17 DIAGNOSIS — Z7951 Long term (current) use of inhaled steroids: Secondary | ICD-10-CM | POA: Diagnosis not present

## 2017-06-17 DIAGNOSIS — J45909 Unspecified asthma, uncomplicated: Secondary | ICD-10-CM | POA: Diagnosis not present

## 2017-06-17 DIAGNOSIS — Z4789 Encounter for other orthopedic aftercare: Secondary | ICD-10-CM | POA: Diagnosis not present

## 2017-06-17 DIAGNOSIS — F419 Anxiety disorder, unspecified: Secondary | ICD-10-CM | POA: Diagnosis not present

## 2017-06-17 DIAGNOSIS — Z79891 Long term (current) use of opiate analgesic: Secondary | ICD-10-CM | POA: Diagnosis not present

## 2017-06-17 DIAGNOSIS — K219 Gastro-esophageal reflux disease without esophagitis: Secondary | ICD-10-CM | POA: Diagnosis not present

## 2017-06-21 ENCOUNTER — Encounter (HOSPITAL_COMMUNITY): Payer: Self-pay | Admitting: Physical Therapy

## 2017-06-21 ENCOUNTER — Other Ambulatory Visit: Payer: Self-pay

## 2017-06-21 ENCOUNTER — Ambulatory Visit (HOSPITAL_COMMUNITY): Payer: BLUE CROSS/BLUE SHIELD | Attending: Internal Medicine | Admitting: Physical Therapy

## 2017-06-21 DIAGNOSIS — Z9181 History of falling: Secondary | ICD-10-CM | POA: Diagnosis not present

## 2017-06-21 DIAGNOSIS — R262 Difficulty in walking, not elsewhere classified: Secondary | ICD-10-CM | POA: Insufficient documentation

## 2017-06-21 DIAGNOSIS — M6281 Muscle weakness (generalized): Secondary | ICD-10-CM | POA: Diagnosis not present

## 2017-06-21 DIAGNOSIS — R2689 Other abnormalities of gait and mobility: Secondary | ICD-10-CM | POA: Diagnosis not present

## 2017-06-21 NOTE — Therapy (Addendum)
[place], [name], [address] Phone: [phone]   Fax:  [phone]  Physical Therapy Evaluation  Patient Details  Name: Zachary Nash MRN: [id number] Date of Birth: March 19, 1971 Referring Provider: Allyn Kenner surgeon Phylliss Bob    Encounter Date: 06/21/2017  PT End of Session - 06/21/17 1410    Visit Number  1    Number of Visits  16    Date for PT Re-Evaluation  08/02/17 reassess on 4/15    Authorization - Visit Number  5( Pt has had [place] PT)   Authorization - Number of Visits  30 visit limit    PT Start Time  1124    PT Stop Time  1210    PT Time Calculation (min)  46 min    Activity Tolerance  Patient tolerated treatment well    Behavior During Therapy  [facility] for tasks assessed/performed       Past Medical History:  Diagnosis Date  . Anxiety   . Asthma   . Bilateral inguinal hernia 05/29/2016  . Complication of anesthesia    states he stopped breathing one time with anesthesia  . Constipation   . Fatty liver   . GERD (gastroesophageal reflux disease)   . History of kidney stones   . Pneumonia   . PONV (postoperative nausea and vomiting)   . Radicular pain of left lower extremity     Past Surgical History:  Procedure Laterality Date  . ANTERIOR CERVICAL DECOMP/DISCECTOMY FUSION N/A 05/26/2017   Procedure: ANTERIOR CERVICAL DECOMPRESSION/DISCECTOMY FUSION TWO LEVELS Cervical five-six, Cervical six-seven;  Surgeon: Phylliss Bob, MD;  Location: [facility];  Service: Orthopedics;  Laterality: N/A;  . CHOLECYSTECTOMY    . ESOPHAGEAL DILATION N/A 03/24/2017   Procedure: ESOPHAGEAL DILATION;  Surgeon: Rogene Houston, MD;  [facility];  Service: Endoscopy;  Laterality: N/A;  . ESOPHAGOGASTRODUODENOSCOPY N/A 03/24/2017   Procedure: ESOPHAGOGASTRODUODENOSCOPY (EGD);  Surgeon: Rogene Houston, MD;  [facility];  Service: Endoscopy;  Laterality: N/A;  1155  . INGUINAL HERNIA REPAIR  Bilateral 05/29/2016   Procedure: OPEN HERNIA REPAIR INGUINAL ADULT BILATERAL WITH insertion of MESH;  Surgeon: Fanny Skates, MD;  Location: [place];  Service: General;  Laterality: Bilateral;  . Lipoma removed      There were no vitals filed for this visit.   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  He states that on Jan. 27th his partner was trying to drop a trailer and was having difficulty and asked him to attempt to pull a pin out. Zachary Nash states that  he was having difficulty as well and on  the fourth attempt he pulled as hard as he could and felt pain going all the way up his left arm.  He woke up the next day and could hardly move.  At the time he was in [place]; when he returned home he made a MD appointment due to the fact that his arm would not quit hurting.  He was given two medications and a band to put around his head to pull against but this did no good.  He was then referred for  x-rays which showed bone spurs therefore the \MD requested an MRI.  When the  MRI came back Zachary Nash  was referred to a Psychologist, sport and exercise.  He was waiting for the surgery when he reached over with  his left  arm and he could not move his legs or his arms. EMS was called.   He went back for a second MRI that lasted four hours.  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   At this time he prayed.  He was then sent to get the CT scan.  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   He ended having a ACDF C5/6 and C6/7  on 05/26/2017.   He continued to have difficulty walking and was referred to home health therapy which ended on  06/18/2017.  He is now being referred to outpatient therapy.  His surgeon stated that he would not start therapy on his neck until 07/06/2017. He is currently in a neck brace.  He is walking with a rollator at  all times. He states that in the last week he has been experiencing tingling which started in his head and is now all over his body.      Pertinent History  GERD,     Limitations  Lifting;Standing;Walking;House hold activities    How long can you sit comfortably?  Pt becomes uncomfortable after 10 mintues but can sit for 30 minutes    How long can you stand comfortably?  5 minutes maximum    How long can you walk comfortably?  with rollator less than five minutes     Patient Stated Goals  To get as much function as he can, walk without assistive device.  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     Currently in Pain?  Yes    Pain Score  8     Pain Location  Neck    Pain Orientation  Posterior    Pain Descriptors / Indicators  Aching;Sharp    Pain Radiating Towards  LT shoulder pain 8/10 as well     Pain Onset  More than a month ago    Pain Frequency  Intermittent    Aggravating Factors   sitting  only comfort is leaning to the right.     Pain Relieving Factors  leaning to the right     Effect of Pain on Daily Activities  limits         [facility] PT Assessment - 06/21/17 0001      Assessment   Medical Diagnosis  ACDF C5-6; C 6-7    Referring Provider  Allyn Kenner surgeon Phylliss Bob     Onset Date/Surgical Date  05/26/17    Next MD Visit  07/06/2017    Prior Therapy  HH       Precautions   Precautions  Fall      Restrictions   Weight Bearing Restrictions  No      Balance Screen   Has the patient fallen in the past 6 months  Yes    How many times?  1    Has the patient had a decrease in activity level because of a fear of falling?   Yes    Is the patient reluctant to leave their home because of a fear of falling?   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     Leisure  go to races; preach, fish, sing, bowl       Cognition   Overall Cognitive Status  Within Functional Limits for tasks assessed      Observation/Other Assessments   Focus on Therapeutic Outcomes (FOTO)   31      Functional Tests   Functional tests  Single leg stance;Sit to Stand      Single Leg Stance   Comments  unable       Sit to Stand   Comments  needs B UE assist; 5 sit to stand  48.86=      ROM / Strength   AROM / PROM / Strength  Strength      Strength   Strength Assessment Site  Hip;Knee;Ankle    Right/Left Hip  Right;Left    Right/Left Knee  Right;Left    Right Knee Extension  3/5    Left Knee Extension  3/5    Right/Left Ankle  Right;Left    Right Ankle Dorsiflexion  3/5    Right Ankle Plantar Flexion  2/5    Left Ankle Dorsiflexion  3/5    Left Ankle Plantar Flexion  2/5      Ambulation/Gait   Ambulation Distance (Feet)  80 Feet    Assistive device  4-wheeled walker              No data recorded  Objective measurements completed on examination: See above findings.      [place] Adult PT Treatment/Exercise - 06/21/17 0001      Ambulation/Gait   Gait Comments  3' Pt ambulating on the heal of his left foot       Exercises   Exercises  Knee/Hip      Knee/Hip Exercises: Seated   Long Arc Quad  Both;10 reps    Other Seated Knee/Hip Exercises  Ankle DF/PF x 10                PT Short Term Goals - 06/21/17 1352      PT SHORT TERM GOAL #1   Title  Pt to be able to ambulate 226 feet in 3 minute time with least assistive device    Time  3    Period  Weeks    Status  New    Target Date  07/12/17      PT SHORT TERM GOAL #2   Title  Pt to be able to single leg sance for 10" bilaterally to decrease risk of falls     Time  3    Period  Weeks    Status  New      PT SHORT TERM  GOAL #3   Title  Pt back and LE mobility to increase to be able to don socks and shoes without difficulty.     Time  3    Period  Weeks    Status  New      PT SHORT TERM GOAL #4   Title  PT LE strength to have increased B to be able to come sit to stand with one UE assist without difficulty.     Time  3  Period  Weeks    Status  New      PT SHORT TERM GOAL #5   Title  Pt to feel confident walkng with a cane inside     Time  3    Period  Weeks    Status  New      Additional Short Term Goals   Additional Short Term Goals  Yes      PT SHORT TERM GOAL #6   Title  When okay by surgeon pt to be completing a HEP for the cervical spine to promote functional ROM     Time  3    Period  Weeks    Status  New        PT Long Term Goals - 06/21/17 1355      PT LONG TERM GOAL #1   Title  PT to be able to ambulate 400 feet in three minutes with least assistive device     Time  6    Period  Weeks    Status  New    Target Date  08/02/17      PT LONG TERM GOAL #2   Title  PT to be able to single leg stance for 20" to allow pt to feel comfortable walking in his yard.     Time  6    Period  Weeks    Status  New      PT LONG TERM GOAL #3   Title  PT LE and core strength to have increased to allow pt to be able to ascend and descend a flight of steps in a reciprocal manner     Time  6    Period  Weeks    Status  New      PT LONG TERM GOAL #4   Title  Pt to feel confident walking in the community without an assistive device     Time  6    Period  Weeks    Status  New      PT LONG TERM GOAL #5   Title  PT cervical ROM to be improved to allow pt to be able to look to his blind spot to feel confident in driving     Time  6    Period  Weeks    Status  New             Plan - 06/21/17 1402    Clinical Impression Statement  Zachary Nash is a 47 yo male who underwent a ACDF of C5-6 and C6-7 on 05/26/2017.  He is still having difficulty with his walking and his grasp.  At this  time he is still wearing his cervical collar and was told by the surgeon that he would start working  on his cervical ROM on 07/06/2017.Marland Kitchen  He is still using a rollator to ambulate with and is not able to walk greater than two minutes therefore he has been referred to skilled outpatient physical therapy.  We will not address his neck or UE until after he sees his surgeon on 07/06/2017, however examination demonstrates an abnormal gait, decreased balance, decreased B LE strength, decreased activity tolerance and difficulty in walking.  Zachary Nash will benefit from skilled physicial therapy to address these issues.      Clinical Presentation  Stable    Clinical Decision Making  High    Rehab Potential  Good    PT Frequency  3x / week    PT Duration  6 weeks    PT Treatment/Interventions  ADLs/Self Care Home Management;DME Instruction;Stair training;Functional mobility training;Gait training;Therapeutic activities;Therapeutic exercise;Balance training;Neuromuscular re-education;Patient/family education;Manual techniques;Passive range of motion    PT Next Visit Plan  begin decompressive exercises 1-5; ab and glut set, bent knee lift, bridging and sidelying clams and abduction        Patient will benefit from skilled therapeutic intervention in order to improve the following deficits and impairments:  Abnormal gait, Decreased activity tolerance, Decreased balance, Decreased coordination, Decreased knowledge of use of DME, Decreased mobility, Decreased strength, Difficulty walking, Impaired perceived functional ability, Pain  Visit Diagnosis: Muscle weakness (generalized)  History of falling  Difficulty in walking, not elsewhere classified  Other abnormalities of gait and mobility     Problem List Patient Active Problem List   Diagnosis Date Noted  . Spinal cord compression ([place])   . Surgery, elective   . Cervical myelopathy ([place])   . Syncope, vasovagal   . Weakness   . Generalized weakness  05/25/2017  . Left sided numbness 05/24/2017  . Right inguinal pain 04/20/2017  . Dysphagia 03/24/2017  . Bilateral inguinal hernia 05/29/2016  . 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 Hyperglycemia 05/18/2013  . Radicular pain of left lower extremity   . Dizziness 05/16/2013    Rayetta Humphrey, PT CLT [phone] 06/21/2017, 2:12 PM  [facility] [address] [place], [name], [address] Phone: [phone]   Fax:  [phone]  Name: Zachary Nash MRN: [id number] Date of Birth: 28-Jan-1971

## 2017-06-21 NOTE — Patient Instructions (Addendum)
ROM: Plantar / Dorsiflexion    With left leg relaxed, gently flex and extend ankle. Move through full range of motion. Avoid pain. Repeat 10____ times per set. Do _1___ sets per session. Do __3__ sessions per day.  http://orth.exer.us/34   Copyright  VHI. All rights reserved.  ROM: Inversion / Eversion    With left leg relaxed, gently turn ankle and foot in and out. Move through full range of motion. Avoid pain. Repeat _10___ times per set. Do _1___ sets per session. Do _3___ sessions per day.  http://orth.exer.us/36   Copyright  VHI. All rights reserved.  Toe Raise (Sitting)    Raise toes, keeping heels on floor. Now raise hour heels up Repeat 10____ times per set. Do __1__ sets per session. Do 4____ sessions per day.  http://orth.exer.us/46   Copyright  VHI. All rights reserved.  Knee Extension (Sitting)    Place __0__ pound weight on left ankle and straighten knee fully, lower slowly. Repeat _15___ times per set. Do _1___ sets per session. Do __2__ sessions per day.  http://orth.exer.us/732   Copyright  VHI. All rights reserved.

## 2017-06-22 ENCOUNTER — Encounter (INDEPENDENT_AMBULATORY_CARE_PROVIDER_SITE_OTHER): Payer: Self-pay | Admitting: Internal Medicine

## 2017-06-22 ENCOUNTER — Ambulatory Visit (INDEPENDENT_AMBULATORY_CARE_PROVIDER_SITE_OTHER): Payer: Self-pay | Admitting: Internal Medicine

## 2017-06-22 NOTE — Progress Notes (Unsigned)
Letter  

## 2017-06-23 ENCOUNTER — Ambulatory Visit (HOSPITAL_COMMUNITY): Payer: BLUE CROSS/BLUE SHIELD | Admitting: Physical Therapy

## 2017-06-23 DIAGNOSIS — R2689 Other abnormalities of gait and mobility: Secondary | ICD-10-CM | POA: Diagnosis not present

## 2017-06-23 DIAGNOSIS — Z6833 Body mass index (BMI) 33.0-33.9, adult: Secondary | ICD-10-CM | POA: Diagnosis not present

## 2017-06-23 DIAGNOSIS — M6281 Muscle weakness (generalized): Secondary | ICD-10-CM

## 2017-06-23 DIAGNOSIS — M5410 Radiculopathy, site unspecified: Secondary | ICD-10-CM | POA: Diagnosis not present

## 2017-06-23 DIAGNOSIS — Z9181 History of falling: Secondary | ICD-10-CM | POA: Diagnosis not present

## 2017-06-23 DIAGNOSIS — R262 Difficulty in walking, not elsewhere classified: Secondary | ICD-10-CM

## 2017-06-23 DIAGNOSIS — R944 Abnormal results of kidney function studies: Secondary | ICD-10-CM | POA: Diagnosis not present

## 2017-06-23 NOTE — Therapy (Signed)
[place], [place], [address] Phone: [phone]   Fax:  [phone]  Physical Therapy Treatment  Patient Details  Name: Zachary Nash MRN: [id number] Date of Birth: January 03, 1971 Referring Provider: Allyn Kenner surgeon Phylliss Bob    Encounter Date: 06/23/2017  PT End of Session - 06/23/17 1324    Visit Number  2    Number of Visits  16    Date for PT Re-Evaluation  08/02/17 reassess on 4/15    Authorization - Visit Number  5 pt had home health     Authorization - Number of Visits  30    PT Start Time  4010    PT Stop Time  1740    PT Time Calculation (min)  50 min    Activity Tolerance  Patient tolerated treatment well    Behavior During Therapy  [facility] for tasks assessed/performed       Past Medical History:  Diagnosis Date  . Anxiety   . Asthma   . Bilateral inguinal hernia 05/29/2016  . Complication of anesthesia    states he stopped breathing one time with anesthesia  . Constipation   . Fatty liver   . GERD (gastroesophageal reflux disease)   . History of kidney stones   . Pneumonia   . PONV (postoperative nausea and vomiting)   . Radicular pain of left lower extremity     Past Surgical History:  Procedure Laterality Date  . ANTERIOR CERVICAL DECOMP/DISCECTOMY FUSION N/A 05/26/2017   Procedure: ANTERIOR CERVICAL DECOMPRESSION/DISCECTOMY FUSION TWO LEVELS Cervical five-six, Cervical six-seven;  Surgeon: Phylliss Bob, MD;  Location: [facility];  Service: Orthopedics;  Laterality: N/A;  . CHOLECYSTECTOMY    . ESOPHAGEAL DILATION N/A 03/24/2017   Procedure: ESOPHAGEAL DILATION;  Surgeon: Rogene Houston, MD;  [facility];  Service: Endoscopy;  Laterality: N/A;  . ESOPHAGOGASTRODUODENOSCOPY N/A 03/24/2017   Procedure: ESOPHAGOGASTRODUODENOSCOPY (EGD);  Surgeon: Rogene Houston, MD;  [facility];  Service: Endoscopy;  Laterality: N/A;  1155  . INGUINAL HERNIA REPAIR Bilateral  05/29/2016   Procedure: OPEN HERNIA REPAIR INGUINAL ADULT BILATERAL WITH insertion of MESH;  Surgeon: Fanny Skates, MD;  Location: [facility];  Service: General;  Laterality: Bilateral;  . Lipoma removed      There were no vitals filed for this visit.  Subjective Assessment - 06/23/17 1716    Subjective  Pt states he continues to have difficulty walking and completing general tasks due to shakiness and LE weakness.  Pt reports pain in Lt quad and in his neck and shoulders.  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  He states that on Jan. 27th his partner was trying to drop a trailer and was having difficulty and asked him to attempt to pull a pin out he was having difficulty as well.  On the fourth attempt he pulled as hard as he could and felt pain going all the way up his left arm.  He woke up the next day and could hardly move.  He was in [place] and had to travel across country with his arm hurting.   When he returned home he made a MD appointment due to the fact that his arm would not quit hurting.  He was given two medications and a band to put around his head to pull against but this did no good.  He then has x-rays  which showed bone spurs therefore he was referred for a MRI,  The MRI came back and he was referred to a surgeon.  He was waiting for the surgery when he moved his left arm and he could not move; he could not move his legs or his arms.  He went back for a second MRI that lasted four hours.  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  At this time he prayed.  He was then sent to get a CT scan when he had the CT they told him that they could not explain it but the vertebral artery had healed itself.   He ended having a ACDF C5/6 and C6/7  on 05/26/2017.   He has been seeing home health thearpy until 06/18/2017.  He is now being referred to outpatient therapy.  His surgeon stated that he would not start therapy  on his neck until 07/06/2017. He is currently in a neck brace.  He is walking with a rollator at all times.  He states that he is now haveing pins and needle all over his body which is constant ,  He states that the tingling started in his head and then went into his hands and legs.         Currently in Pain?  Yes    Pain Score  8     Pain Location  Neck    Pain Orientation  Posterior    Pain Descriptors / Indicators  Aching                No data recorded       [facility] Adult PT Treatment/Exercise - 06/23/17 0001      Ambulation/Gait   Ambulation Distance (Feet)  226 Feet    Assistive device  4-wheeled walker      Knee/Hip Exercises: Seated   Other Seated Knee/Hip Exercises  Ankle DF/PF x 10       Knee/Hip Exercises: Supine   Bridges  Both;10 reps;Limitations    Bridges Limitations  minimal ROM due to compression on cervical spine    Straight Leg Raises  Both;10 reps    Other Supine Knee/Hip Exercises  decompression 1-5 5reps each    Other Supine Knee/Hip Exercises  abdominal contractions, glute sets 10X3" each             PT Education - 06/23/17 1831    Education provided  Yes    Education Details  reveiwed HEP and goals per initial evaluation.  Worked on logroll technique for safely transferring supine to/from sit.     Person(s) Educated  Patient    Methods  Explanation;Demonstration;Tactile cues;Verbal cues    Comprehension  Verbalized understanding;Returned demonstration;Verbal cues required       PT Short Term Goals - 06/21/17 1352      PT SHORT TERM GOAL #1   Title  Pt to be able to ambulate 226 feet in 3 minute time with least assistive device    Time  3    Period  Weeks    Status  New    Target Date  07/12/17      PT SHORT TERM GOAL #2   Title  Pt to be able to single leg sance for 10" bilaterally to decrease risk of falls     Time  3    Period  Weeks    Status  New      PT SHORT TERM GOAL #3  Title  Pt back and LE mobility to increase to  be able to don socks and shoes without difficulty.     Time  3    Period  Weeks    Status  New      PT SHORT TERM GOAL #4   Title  PT LE strength to have increased B to be able to come sit to stand with one UE assist without difficulty.     Time  3    Period  Weeks    Status  New      PT SHORT TERM GOAL #5   Title  Pt to feel confident walkng with a cane inside     Time  3    Period  Weeks    Status  New      Additional Short Term Goals   Additional Short Term Goals  Yes      PT SHORT TERM GOAL #6   Title  When okay by surgeon pt to be completing a HEP for the cervical spine to promote functional ROM     Time  3    Period  Weeks    Status  New        PT Long Term Goals - 06/21/17 1355      PT LONG TERM GOAL #1   Title  PT to be able to ambulate 400 feet in three minutes with least assistive device     Time  6    Period  Weeks    Status  New    Target Date  08/02/17      PT LONG TERM GOAL #2   Title  PT to be able to single leg stance for 20" to allow pt to feel comfortable walking in his yard.     Time  6    Period  Weeks    Status  New      PT LONG TERM GOAL #3   Title  PT LE and core strength to have increased to allow pt to be able to ascend and descend a flight of steps in a reciprocal manner     Time  6    Period  Weeks    Status  New      PT LONG TERM GOAL #4   Title  Pt to feel confident walking in the community without an assistive device     Time  6    Period  Weeks    Status  New      PT LONG TERM GOAL #5   Title  PT cervical ROM to be improved to allow pt to be able to look to his blind spot to feel confident in driving     Time  [place] - 06/23/17 1833    Clinical Impression Statement  Reviewed initial evaluation and goals for therapy.  Gait training focus initially with patient presenting ongoing inabilility to place ball of foot down due to shoooting nerve pains up into Lt side.  Pt has  borrowed a different rollator that is easier to manuever, thus ambulate longer distances.  Worked on "logroll" bed mobiltiy wtih pt able to demonstrate correctly and independently.  Began decompression with gentle shoulder and cervical contractions as well as core stability exercises.  PT able to complete with increased time and cues.  PT also with sporatic  involutnary mm contractions throughout body at times and uncoordination with LT LE.  Encouraged pateint to have wife resume stretching his lt ankle and toes.  PT verbalized understanding.     Rehab Potential  Good    PT Frequency  3x / week    PT Duration  6 weeks    PT Treatment/Interventions  ADLs/Self Care Home Management;DME Instruction;Stair training;Functional mobility training;Gait training;Therapeutic activities;Therapeutic exercise;Balance training;Neuromuscular re-education;Patient/family education;Manual techniques;Passive range of motion    PT Next Visit Plan  Next session begin bent knee lift and sidelying clams and abduction.  Attempt standing heelraises, tandem stance and other functional strengthening exercises as able.         Patient will benefit from skilled therapeutic intervention in order to improve the following deficits and impairments:  Abnormal gait, Decreased activity tolerance, Decreased balance, Decreased coordination, Decreased knowledge of use of DME, Decreased mobility, Decreased strength, Difficulty walking, Impaired perceived functional ability, Pain  Visit Diagnosis: Muscle weakness (generalized)  History of falling  Difficulty in walking, not elsewhere classified  Other abnormalities of gait and mobility     Problem List Patient Active Problem List   Diagnosis Date Noted  . Spinal cord compression ([place])   . Surgery, elective   . Cervical myelopathy ([place])   . Syncope, vasovagal   . Weakness   . Generalized weakness 05/25/2017  . Left sided numbness 05/24/2017  . Right inguinal pain 04/20/2017  .  Dysphagia 03/24/2017  . Bilateral inguinal hernia 05/29/2016  . 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 Hyperglycemia 05/18/2013  . Radicular pain of left lower extremity   . Dizziness 05/16/2013   Teena Irani, PTA/CLT [phone]  Teena Irani 06/23/2017, 6:40 PM  [facility] [address] [place], [place], [address] Phone: [phone]   Fax:  [phone]  Name: JAQUALIN SERPA MRN: [id number] Date of Birth: 1971/01/15

## 2017-06-24 ENCOUNTER — Telehealth (INDEPENDENT_AMBULATORY_CARE_PROVIDER_SITE_OTHER): Payer: Self-pay | Admitting: Orthopedic Surgery

## 2017-06-24 NOTE — Telephone Encounter (Signed)
Patient is wondering if Dr. Marlou Sa could help him find old records from the early 2000's regarding a wrist injury while he was working at [employer]. He said he just wanted to verify whether it was left/right. He knows he was a patient of [facility] and he is certain Dr. Marlou Sa was his doctor. I checked in the old system and could not find his name/DOB. If you can help, give him a call # [phone]

## 2017-06-25 ENCOUNTER — Ambulatory Visit (HOSPITAL_COMMUNITY): Payer: BLUE CROSS/BLUE SHIELD | Admitting: Physical Therapy

## 2017-06-25 ENCOUNTER — Encounter (HOSPITAL_COMMUNITY): Payer: Self-pay | Admitting: Physical Therapy

## 2017-06-25 DIAGNOSIS — R262 Difficulty in walking, not elsewhere classified: Secondary | ICD-10-CM | POA: Diagnosis not present

## 2017-06-25 DIAGNOSIS — Z9181 History of falling: Secondary | ICD-10-CM

## 2017-06-25 DIAGNOSIS — M6281 Muscle weakness (generalized): Secondary | ICD-10-CM | POA: Diagnosis not present

## 2017-06-25 DIAGNOSIS — R2689 Other abnormalities of gait and mobility: Secondary | ICD-10-CM | POA: Diagnosis not present

## 2017-06-25 NOTE — Telephone Encounter (Signed)
IC s/w patient and he wanted to verify that he was a former patient of Dr Biomedical scientist. He stated he has a work comp claim for his neck that an attorney is trying to help him with and needed this information. I advised that I would not be able to verify, because he is not listed in our old system at all. I advised if the attorney needed to verify, could contact medical records department.

## 2017-06-25 NOTE — Patient Instructions (Addendum)
Adduction With Resistance     Squeeze legs together while resisting with  A pillow between your legs for _5__ seconds. Repeat 10___ times. Do _1__ sessions per day. Note: If possible, place feet on floor.  Copyright  VHI. All rights reserved.  Marching   Keep back tall stomach tight  Alternate lifting knees as high as is comfortable, as if marching. Repeat _10__ times each leg. Do _1__ sessions per day. Note: If possible place feet on floor.  Copyright  VHI. All rights reserved.  Hip Abduction / Adduction: with Knee Flexion (Supine)    With both  knee bent, gently lower knee to side and return. Repeat _10___ times per set. Do __1__ sets per session. Do __2__ sessions per day. Now do with opposite leg  http://orth.exer.us/682   Copyright  VHI. All rights reserved.

## 2017-06-25 NOTE — Therapy (Signed)
[facility] [place], [name], [address] Phone: [phone]   Fax:  [phone]  Physical Therapy Treatment  Patient Details  Name: Zachary Nash MRN: [id number] Date of Birth: February 20, 1971 Referring Provider: Allyn Kenner surgeon Phylliss Bob    Encounter Date: 06/25/2017  PT End of Session - 06/25/17 1012    Visit Number  3    Number of Visits  16    Date for PT Re-Evaluation  08/02/17 reassess on 4/15    Authorization - Visit Number  7 pt had home health     Authorization - Number of Visits  30    PT Start Time  0945    PT Stop Time  1030    PT Time Calculation (min)  45 min    Activity Tolerance  Patient tolerated treatment well    Behavior During Therapy  [facility] for tasks assessed/performed       Past Medical History:  Diagnosis Date  . Anxiety   . Asthma   . Bilateral inguinal hernia 05/29/2016  . Complication of anesthesia    states he stopped breathing one time with anesthesia  . Constipation   . Fatty liver   . GERD (gastroesophageal reflux disease)   . History of kidney stones   . Pneumonia   . PONV (postoperative nausea and vomiting)   . Radicular pain of left lower extremity     Past Surgical History:  Procedure Laterality Date  . ANTERIOR CERVICAL DECOMP/DISCECTOMY FUSION N/A 05/26/2017   Procedure: ANTERIOR CERVICAL DECOMPRESSION/DISCECTOMY FUSION TWO LEVELS Cervical five-six, Cervical six-seven;  Surgeon: Phylliss Bob, MD;  Location: [facility];  Service: Orthopedics;  Laterality: N/A;  . CHOLECYSTECTOMY    . ESOPHAGEAL DILATION N/A 03/24/2017   Procedure: ESOPHAGEAL DILATION;  Surgeon: Rogene Houston, MD;  [facility];  Service: Endoscopy;  Laterality: N/A;  . ESOPHAGOGASTRODUODENOSCOPY N/A 03/24/2017   Procedure: ESOPHAGOGASTRODUODENOSCOPY (EGD);  Surgeon: Rogene Houston, MD;  [facility];  Service: Endoscopy;  Laterality: N/A;  1155  . INGUINAL HERNIA REPAIR Bilateral  05/29/2016   Procedure: OPEN HERNIA REPAIR INGUINAL ADULT BILATERAL WITH insertion of MESH;  Surgeon: Fanny Skates, MD;  Location: [facility];  Service: General;  Laterality: Bilateral;  . Lipoma removed      There were no vitals filed for this visit.  Subjective Assessment - 06/25/17 0944    Subjective  Pt is completing his exercises once a day; ;therapist urged pt to complete 2-3 times a day.     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  He states that on Jan. 27th his partner was trying to drop a trailer and was having difficulty and asked him to attempt to pull a pin out he was having difficulty as well.  On the fourth attempt he pulled as hard as he could and felt pain going all the way up his left arm.  He woke up the next day and could hardly move.  He was in [place] and had to travel across country with his arm hurting.   When he returned home he made a MD appointment due to the fact that his arm would not quit hurting.  He was given two medications and a band to put around his head to pull against but this did no good.  He then has x-rays which showed bone spurs therefore he was referred for a MRI,  The MRI came back and he  was referred to a Psychologist, sport and exercise.  He was waiting for the surgery when he moved his left arm and he could not move; he could not move his legs or his arms.  He went back for a second MRI that lasted four hours.  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  At this time he prayed.  He was then sent to get a CT scan when he had the CT they told him that they could not explain it but the vertebral artery had healed itself.   He ended having a ACDF C5/6 and C6/7  on 05/26/2017.   He has been seeing home health thearpy until 06/18/2017.  He is now being referred to outpatient therapy.  His surgeon stated that he would not start therapy on his neck until 07/06/2017. He is currently in a neck brace.  He is walking with a rollator at all  times.  He states that he is now haveing pins and needle all over his body which is constant ,  He states that the tingling started in his head and then went into his hands and legs.         Currently in Pain?  Yes    Pain Score  8     Pain Location  Neck    Pain Orientation  Left    Pain Descriptors / Indicators  Aching    Pain Type  Chronic pain    Pain Frequency  Constant    Aggravating Factors   nothing     Pain Relieving Factors  nothing                 No data recorded       [facility] Adult PT Treatment/Exercise - 06/25/17 0001      Exercises   Exercises  Lumbar;Knee/Hip      Lumbar Exercises: Supine   Clam  10 reps    Heel Slides  10 reps    Bent Knee Raise  10 reps    Straight Leg Raise  10 reps      Knee/Hip Exercises: Seated   Long Arc Quad  Right;15 reps    Long Arc Quad Limitations  5 second hold     Ball Squeeze  10    Other Seated Knee/Hip Exercises  Ankle DF/PF x 10     Other Seated Knee/Hip Exercises  marching x 10    Sit to Sand  5 reps at higher height x 5       Knee/Hip Exercises: Supine   Straight Leg Raises  --    Other Supine Knee/Hip Exercises  --    Other Supine Knee/Hip Exercises  --               PT Short Term Goals - 06/25/17 1015      PT SHORT TERM GOAL #1   Title  Pt to be able to ambulate 226 feet in 3 minute time with least assistive device    Time  3    Period  Weeks    Status  On-going      PT SHORT TERM GOAL #2   Title  Pt to be able to single leg sance for 10" bilaterally to decrease risk of falls     Time  3    Period  Weeks    Status  On-going      PT SHORT TERM GOAL #3   Title  Pt back and  LE mobility to increase to be able to don socks and shoes without difficulty.     Time  3    Period  Weeks    Status  On-going      PT SHORT TERM GOAL #4   Title  PT LE strength to have increased B to be able to come sit to stand with one UE assist without difficulty.     Time  3    Period  Weeks    Status   On-going      PT SHORT TERM GOAL #5   Title  Pt to feel confident walkng with a cane inside     Time  3    Period  Weeks    Status  On-going      PT SHORT TERM GOAL #6   Title  When okay by surgeon pt to be completing a HEP for the cervical spine to promote functional ROM     Time  3    Period  Weeks    Status  On-going        PT Long Term Goals - 06/25/17 1015      PT LONG TERM GOAL #1   Title  PT to be able to ambulate 400 feet in three minutes with least assistive device     Time  6    Period  Weeks    Status  On-going      PT LONG TERM GOAL #2   Title  PT to be able to single leg stance for 20" to allow pt to feel comfortable walking in his yard.     Time  6    Period  Weeks    Status  On-going      PT LONG TERM GOAL #3   Title  PT LE and core strength to have increased to allow pt to be able to ascend and descend a flight of steps in a reciprocal manner     Time  6    Period  Weeks    Status  On-going      PT LONG TERM GOAL #4   Title  Pt to feel confident walking in the community without an assistive device     Time  6    Period  Weeks    Status  On-going      PT LONG TERM GOAL #5   Title  PT cervical ROM to be improved to allow pt to be able to look to his blind spot to feel confident in driving     Time  6    Period  Weeks    Status  On-going            Plan - 06/25/17 1013    Clinical Impression Statement  Added sit to stand and supine stabilizing exerices to improve core and LE strrength.  Therapist stressed the importance of not walking on the heel of his foot.     Rehab Potential  Good    PT Frequency  3x / week    PT Duration  6 weeks    PT Treatment/Interventions  ADLs/Self Care Home Management;DME Instruction;Stair training;Functional mobility training;Gait training;Therapeutic activities;Therapeutic exercise;Balance training;Neuromuscular re-education;Patient/family education;Manual techniques;Passive range of motion    PT Next Visit Plan   Next session begin  sidelying clams and abduction.  Attempt standing heelraises, tandem stance and other functional strengthening exercises as able.         Patient will benefit from skilled therapeutic intervention  in order to improve the following deficits and impairments:  Abnormal gait, Decreased activity tolerance, Decreased balance, Decreased coordination, Decreased knowledge of use of DME, Decreased mobility, Decreased strength, Difficulty walking, Impaired perceived functional ability, Pain  Visit Diagnosis: Muscle weakness (generalized)  History of falling  Difficulty in walking, not elsewhere classified     Problem List Patient Active Problem List   Diagnosis Date Noted  . Spinal cord compression ([place])   . Surgery, elective   . Cervical myelopathy ([place])   . Syncope, vasovagal   . Weakness   . Generalized weakness 05/25/2017  . Left sided numbness 05/24/2017  . Right inguinal pain 04/20/2017  . Dysphagia 03/24/2017  . Bilateral inguinal hernia 05/29/2016  . 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 Hyperglycemia 05/18/2013  . Radicular pain of left lower extremity   . Dizziness 05/16/2013   Rayetta Humphrey, PT CLT [phone] 06/25/2017, 10:33 AM  [facility] [address] [place], [name], [address] Phone: [phone]   Fax:  [phone]  Name: Zachary Nash MRN: [id number] Date of Birth: 1970/08/27

## 2017-06-28 ENCOUNTER — Ambulatory Visit (HOSPITAL_COMMUNITY): Payer: BLUE CROSS/BLUE SHIELD | Attending: Internal Medicine | Admitting: Physical Therapy

## 2017-06-28 ENCOUNTER — Encounter (HOSPITAL_COMMUNITY): Payer: Self-pay | Admitting: Physical Therapy

## 2017-06-28 DIAGNOSIS — Z9181 History of falling: Secondary | ICD-10-CM | POA: Insufficient documentation

## 2017-06-28 DIAGNOSIS — R262 Difficulty in walking, not elsewhere classified: Secondary | ICD-10-CM | POA: Diagnosis not present

## 2017-06-28 DIAGNOSIS — M6281 Muscle weakness (generalized): Secondary | ICD-10-CM | POA: Diagnosis not present

## 2017-06-28 DIAGNOSIS — R2689 Other abnormalities of gait and mobility: Secondary | ICD-10-CM | POA: Insufficient documentation

## 2017-06-28 NOTE — Patient Instructions (Signed)
Strengthening: Hip Abduction (Side-Lying)    Tighten muscles on front of left thigh, then lift leg 15____ inches from surface, keeping knee locked.  Repeat __15__ times per set. Do ___1_ sets per session. Do _2___ sessions per day.  http://orth.exer.us/622   Copyright  VHI. All rights reserved.  Heel Raise: Bilateral (Standing)   At kitchen counter with a chair behind you  Rise on balls of feet. Repeat 15____ times per set. Do _1___ sets per session. Do _2___ sessions per day.  http://orth.exer.us/38   Copyright  VHI. All rights reserved.

## 2017-06-28 NOTE — Therapy (Signed)
[place], [name], [address] Phone: [phone]   Fax:  [phone]  Physical Therapy Treatment  Patient Details  Name: Zachary Nash MRN: [id number] Date of Birth: 04/09/1970 Referring Provider: Allyn Kenner surgeon Phylliss Bob    Encounter Date: 06/28/2017  PT End of Session - 06/28/17 0850    Visit Number  4    Number of Visits  16    Date for PT Re-Evaluation  08/02/17 reassess on 4/15    Authorization - Visit Number  8 pt had home health     Authorization - Number of Visits  30    PT Start Time  0817    PT Stop Time  0857    PT Time Calculation (min)  40 min    Activity Tolerance  Patient tolerated treatment well    Behavior During Therapy  [facility] for tasks assessed/performed       Past Medical History:  Diagnosis Date  . Anxiety   . Asthma   . Bilateral inguinal hernia 05/29/2016  . Complication of anesthesia    states he stopped breathing one time with anesthesia  . Constipation   . Fatty liver   . GERD (gastroesophageal reflux disease)   . History of kidney stones   . Pneumonia   . PONV (postoperative nausea and vomiting)   . Radicular pain of left lower extremity     Past Surgical History:  Procedure Laterality Date  . ANTERIOR CERVICAL DECOMP/DISCECTOMY FUSION N/A 05/26/2017   Procedure: ANTERIOR CERVICAL DECOMPRESSION/DISCECTOMY FUSION TWO LEVELS Cervical five-six, Cervical six-seven;  Surgeon: Phylliss Bob, MD;  Location: [place];  Service: Orthopedics;  Laterality: N/A;  . CHOLECYSTECTOMY    . ESOPHAGEAL DILATION N/A 03/24/2017   Procedure: ESOPHAGEAL DILATION;  Surgeon: Rogene Houston, MD;  [facility];  Service: Endoscopy;  Laterality: N/A;  . ESOPHAGOGASTRODUODENOSCOPY N/A 03/24/2017   Procedure: ESOPHAGOGASTRODUODENOSCOPY (EGD);  Surgeon: Rogene Houston, MD;  [facility];  Service: Endoscopy;  Laterality: N/A;  1155  . INGUINAL HERNIA REPAIR Bilateral 05/29/2016    Procedure: OPEN HERNIA REPAIR INGUINAL ADULT BILATERAL WITH insertion of MESH;  Surgeon: Fanny Skates, MD;  Location: [place];  Service: General;  Laterality: Bilateral;  . Lipoma removed      There were no vitals filed for this visit.  Subjective Assessment - 06/28/17 0816    Subjective  Pt states that he worked on not walking on his Lt heel.  HIs Lt arm is having sharp pains when he bends it.     Pertinent History  Cervical fusion     Currently in Pain?  Yes    Pain Score  7     Pain Location  Arm    Pain Orientation  Left    Pain Descriptors / Indicators  Sharp    Pain Type  Acute pain    Pain Onset  More than a month ago    Pain Frequency  Intermittent                No data recorded       [facility] Adult PT Treatment/Exercise - 06/28/17 0001      Exercises   Exercises  Lumbar;Knee/Hip      Lumbar Exercises: Standing   Heel Raises  15 reps    Functional Squats  15 reps    Forward Lunge  15 reps    Side Lunge  10 reps  Side stepping with  blue t band x 3 RT at mat      Lumbar Exercises: Seated   Other Seated Lumbar Exercises  ankle DF 15       Lumbar Exercises: Supine   Clam  --    Heel Slides  --    Bent Knee Raise  --    Straight Leg Raise  --      Knee/Hip Exercises: Seated   Long Arc Quad  Right;15 reps    Long Arc Quad Weight  3 lbs.    Long [employer] Limitations  5 second hold     [employer]  --    Other Seated Knee/Hip Exercises  --    Other Seated Knee/Hip Exercises  --    Sit to [employer]  15 reps      Knee/Hip Exercises: Sidelying   Hip ABduction  10 reps    Clams  10               PT Short Term Goals - 06/25/17 1015      PT SHORT TERM GOAL #1   Title  Pt to be able to ambulate 226 feet in 3 minute time with least assistive device    Time  3    Period  Weeks    Status  On-going      PT SHORT TERM GOAL #2   Title  Pt to be able to single leg sance for 10" bilaterally to decrease risk of falls     Time  3    Period  Weeks     Status  On-going      PT SHORT TERM GOAL #3   Title  Pt back and LE mobility to increase to be able to don socks and shoes without difficulty.     Time  3    Period  Weeks    Status  On-going      PT SHORT TERM GOAL #4   Title  PT LE strength to have increased B to be able to come sit to stand with one UE assist without difficulty.     Time  3    Period  Weeks    Status  On-going      PT SHORT TERM GOAL #5   Title  Pt to feel confident walkng with a cane inside     Time  3    Period  Weeks    Status  On-going      PT SHORT TERM GOAL #6   Title  When okay by surgeon pt to be completing a HEP for the cervical spine to promote functional ROM     Time  3    Period  Weeks    Status  On-going        PT Long Term Goals - 06/25/17 1015      PT LONG TERM GOAL #1   Title  PT to be able to ambulate 400 feet in three minutes with least assistive device     Time  6    Period  Weeks    Status  On-going      PT LONG TERM GOAL #2   Title  PT to be able to single leg stance for 20" to allow pt to feel comfortable walking in his yard.     Time  6    Period  Weeks    Status  On-going      PT LONG TERM GOAL #3   Title  PT LE and core strength to have increased to allow pt to be able to ascend and descend a flight of steps in a reciprocal manner     Time  6    Period  Weeks    Status  On-going      PT LONG TERM GOAL #4   Title  Pt to feel confident walking in the community without an assistive device     Time  6    Period  Weeks    Status  On-going      PT LONG TERM GOAL #5   Title  PT cervical ROM to be improved to allow pt to be able to look to his blind spot to feel confident in driving     Time  6    Period  Weeks    Status  On-going            Plan - 06/28/17 0858    Rehab Potential  Good    PT Frequency  3x / week    PT Duration  6 weeks    PT Treatment/Interventions  ADLs/Self Care Home Management;DME Instruction;Stair training;Functional mobility  training;Gait training;Therapeutic activities;Therapeutic exercise;Balance training;Neuromuscular re-education;Patient/family education;Manual techniques;Passive range of motion    PT Next Visit Plan  , tandem stance and other functional strengthening exercises as able.         Patient will benefit from skilled therapeutic intervention in order to improve the following deficits and impairments:  Abnormal gait, Decreased activity tolerance, Decreased balance, Decreased coordination, Decreased knowledge of use of DME, Decreased mobility, Decreased strength, Difficulty walking, Impaired perceived functional ability, Pain  Visit Diagnosis: Muscle weakness (generalized)  History of falling  Difficulty in walking, not elsewhere classified  Other abnormalities of gait and mobility     Problem List Patient Active Problem List   Diagnosis Date Noted  . Spinal cord compression ([place])   . Surgery, elective   . Cervical myelopathy ([place])   . Syncope, vasovagal   . Weakness   . Generalized weakness 05/25/2017  . Left sided numbness 05/24/2017  . Right inguinal pain 04/20/2017  . Dysphagia 03/24/2017  . Bilateral inguinal hernia 05/29/2016  . 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 Hyperglycemia 05/18/2013  . Radicular pain of left lower extremity   . Dizziness 05/16/2013  Rayetta Humphrey, PT CLT [phone] 06/28/2017, 8:59 AM  [place] [address] [place], [name], [address] Phone: [phone]   Fax:  [phone]  Name: Zachary Nash MRN: [id number] Date of Birth: 07/29/1970

## 2017-06-30 ENCOUNTER — Ambulatory Visit (HOSPITAL_COMMUNITY): Payer: BLUE CROSS/BLUE SHIELD | Admitting: Physical Therapy

## 2017-06-30 DIAGNOSIS — Z9181 History of falling: Secondary | ICD-10-CM | POA: Diagnosis not present

## 2017-06-30 DIAGNOSIS — R2689 Other abnormalities of gait and mobility: Secondary | ICD-10-CM | POA: Diagnosis not present

## 2017-06-30 DIAGNOSIS — M6281 Muscle weakness (generalized): Secondary | ICD-10-CM | POA: Diagnosis not present

## 2017-06-30 DIAGNOSIS — R262 Difficulty in walking, not elsewhere classified: Secondary | ICD-10-CM

## 2017-06-30 NOTE — Therapy (Signed)
[place], [name], [address] Phone: [phone]   Fax:  [phone]  Physical Therapy Treatment  Patient Details  Name: Zachary Nash MRN: [id number] Date of Birth: [birth year] Referring Provider: Allyn Kenner surgeon Phylliss Bob    Encounter Date: 06/30/2017  PT End of Session - 06/30/17 1152    Visit Number  5    Number of Visits  16    Date for PT Re-Evaluation  08/02/17 reassess on 4/15    Authorization - Visit Number  9 pt had home health     Authorization - Number of Visits  30    PT Start Time  1118    PT Stop Time  1200    PT Time Calculation (min)  42 min    Activity Tolerance  Patient tolerated treatment well    Behavior During Therapy  [facility] for tasks assessed/performed       Past Medical History:  Diagnosis Date  . Anxiety   . Asthma   . Bilateral inguinal hernia 05/29/2016  . Complication of anesthesia    states he stopped breathing one time with anesthesia  . Constipation   . Fatty liver   . GERD (gastroesophageal reflux disease)   . History of kidney stones   . Pneumonia   . PONV (postoperative nausea and vomiting)   . Radicular pain of left lower extremity     Past Surgical History:  Procedure Laterality Date  . ANTERIOR CERVICAL DECOMP/DISCECTOMY FUSION N/A 05/26/2017   Procedure: ANTERIOR CERVICAL DECOMPRESSION/DISCECTOMY FUSION TWO LEVELS Cervical five-six, Cervical six-seven;  Surgeon: Phylliss Bob, MD;  Location: [place];  Service: Orthopedics;  Laterality: N/A;  . CHOLECYSTECTOMY    . ESOPHAGEAL DILATION N/A 03/24/2017   Procedure: ESOPHAGEAL DILATION;  Surgeon: Rogene Houston, MD;  [facility];  Service: Endoscopy;  Laterality: N/A;  . ESOPHAGOGASTRODUODENOSCOPY N/A 03/24/2017   Procedure: ESOPHAGOGASTRODUODENOSCOPY (EGD);  Surgeon: Rogene Houston, MD;  [facility];  Service: Endoscopy;  Laterality: N/A;  1155  . INGUINAL HERNIA REPAIR Bilateral 05/29/2016    Procedure: OPEN HERNIA REPAIR INGUINAL ADULT BILATERAL WITH insertion of MESH;  Surgeon: Fanny Skates, MD;  Location: [place];  Service: General;  Laterality: Bilateral;  . Lipoma removed      There were no vitals filed for this visit.  Subjective Assessment - 06/30/17 1153    Subjective  Pt states his wife or another family member is stretching his Lt foot/toes.  STates he is working on his HEP and getting stronger.  STates his Lt shoulder/neck/arm is hurting, especially at night and stiff when he wakes up.  No other issues.  States he returns to [place] on Tuesday 4/9 to have his collar removed and get general check up on neck.                        [facility] Adult PT Treatment/Exercise - 06/30/17 0001      Lumbar Exercises: Standing   Heel Raises  15 reps;Limitations    Heel Raises Limitations  toeraises 15 reps    Functional Squats  15 reps    Forward Lunge  15 reps;Limitations    Forward Lunge Limitations  4" with 1 UE assist    Side Lunge  10 reps;Limitations    Side Lunge Limitations  4" with 1 UE assist    Other Standing Lumbar Exercises  side stepping with blue tband x 3 RT  in front of mat with therapist HHA in front    Other Standing Lumbar Exercises  tandem stance 30" X 2 each no UE's      Knee/Hip Exercises: Seated   Long Arc Quad  Right;15 reps    Long Arc Quad Weight  3 lbs.    Long [employer] Limitations  5 second hold     Sit to [employer]  15 reps;without UE support               PT Short Term Goals - 06/25/17 1015      PT SHORT TERM GOAL #1   Title  Pt to be able to ambulate 226 feet in 3 minute time with least assistive device    Time  3    Period  Weeks    Status  On-going      PT SHORT TERM GOAL #2   Title  Pt to be able to single leg sance for 10" bilaterally to decrease risk of falls     Time  3    Period  Weeks    Status  On-going      PT SHORT TERM GOAL #3   Title  Pt back and LE mobility to increase to be able to don socks and shoes  without difficulty.     Time  3    Period  Weeks    Status  On-going      PT SHORT TERM GOAL #4   Title  PT LE strength to have increased B to be able to come sit to stand with one UE assist without difficulty.     Time  3    Period  Weeks    Status  On-going      PT SHORT TERM GOAL #5   Title  Pt to feel confident walkng with a cane inside     Time  3    Period  Weeks    Status  On-going      PT SHORT TERM GOAL #6   Title  When okay by surgeon pt to be completing a HEP for the cervical spine to promote functional ROM     Time  3    Period  Weeks    Status  On-going        PT Long Term Goals - 06/25/17 1015      PT LONG TERM GOAL #1   Title  PT to be able to ambulate 400 feet in three minutes with least assistive device     Time  6    Period  Weeks    Status  On-going      PT LONG TERM GOAL #2   Title  PT to be able to single leg stance for 20" to allow pt to feel comfortable walking in his yard.     Time  6    Period  Weeks    Status  On-going      PT LONG TERM GOAL #3   Title  PT LE and core strength to have increased to allow pt to be able to ascend and descend a flight of steps in a reciprocal manner     Time  6    Period  Weeks    Status  On-going      PT LONG TERM GOAL #4   Title  Pt to feel confident walking in the community without an assistive device     Time  6  Period  Weeks    Status  On-going      PT LONG TERM GOAL #5   Title  PT cervical ROM to be improved to allow pt to be able to look to his blind spot to feel confident in driving     Time  6    Period  Weeks    Status  On-going            Plan - 06/30/17 1157    Clinical Impression Statement  Continued wtih exercise progression working on LE strength and stability.  Pt required manual and verbal cues for posture with therex and proper technique.  Continued improvement in ambulation with increased weight into forefoot.  Added tandem stance today with noted challenge.  Pt able to  maintain entire 30 second hold with each.  Pt with noted fatigue at EOS only able to complete 6 STS.  pt progressing well overall.     Rehab Potential  Good    PT Frequency  3x / week    PT Duration  6 weeks    PT Treatment/Interventions  ADLs/Self Care Home Management;DME Instruction;Stair training;Functional mobility training;Gait training;Therapeutic activities;Therapeutic exercise;Balance training;Neuromuscular re-education;Patient/family education;Manual techniques;Passive range of motion    PT Next Visit Plan  Continue with focus on LE strength and stability until returns to [place] next week for cervical.  Next session begin lateral step ups and single leg stance.         Patient will benefit from skilled therapeutic intervention in order to improve the following deficits and impairments:  Abnormal gait, Decreased activity tolerance, Decreased balance, Decreased coordination, Decreased knowledge of use of DME, Decreased mobility, Decreased strength, Difficulty walking, Impaired perceived functional ability, Pain  Visit Diagnosis: Muscle weakness (generalized)  History of falling  Difficulty in walking, not elsewhere classified  Other abnormalities of gait and mobility     Problem List Patient Active Problem List   Diagnosis Date Noted  . Spinal cord compression ([place])   . Surgery, elective   . Cervical myelopathy ([place])   . Syncope, vasovagal   . Weakness   . Generalized weakness 05/25/2017  . Left sided numbness 05/24/2017  . Right inguinal pain 04/20/2017  . Dysphagia 03/24/2017  . Bilateral inguinal hernia 05/29/2016  . 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 Hyperglycemia 05/18/2013  . Radicular pain of left lower extremity   . Dizziness 05/16/2013   Teena Irani, PTA/CLT [phone]  Teena Irani 06/30/2017, 12:06 PM  [place] [address] [place], [name], [address] Phone: [phone]   Fax:  [phone]  Name:  Zachary Nash MRN: [id number] Date of Birth: March 06, 1971

## 2017-07-02 ENCOUNTER — Encounter (HOSPITAL_COMMUNITY): Payer: Self-pay | Admitting: Physical Therapy

## 2017-07-02 ENCOUNTER — Ambulatory Visit (HOSPITAL_COMMUNITY): Payer: BLUE CROSS/BLUE SHIELD | Admitting: Physical Therapy

## 2017-07-02 DIAGNOSIS — Z9181 History of falling: Secondary | ICD-10-CM | POA: Diagnosis not present

## 2017-07-02 DIAGNOSIS — M6281 Muscle weakness (generalized): Secondary | ICD-10-CM

## 2017-07-02 DIAGNOSIS — R262 Difficulty in walking, not elsewhere classified: Secondary | ICD-10-CM

## 2017-07-02 DIAGNOSIS — R2689 Other abnormalities of gait and mobility: Secondary | ICD-10-CM | POA: Diagnosis not present

## 2017-07-02 NOTE — Therapy (Signed)
[place], [name], [address] Phone: [phone]   Fax:  [phone]  Physical Therapy Treatment  Patient Details  Name: Zachary Nash MRN: [id number] Date of Birth: [birth year] Referring Provider: Allyn Kenner surgeon Phylliss Bob    Encounter Date: 07/02/2017  PT End of Session - 07/02/17 0953    Visit Number  6    Number of Visits  16    Date for PT Re-Evaluation  08/02/17 reassess on 4/15    Authorization - Visit Number  10 pt had home health     Authorization - Number of Visits  30    PT Start Time  [phone]    PT Stop Time  1035    PT Time Calculation (min)  49 min    Activity Tolerance  Patient tolerated treatment well    Behavior During Therapy  [facility] for tasks assessed/performed       Past Medical History:  Diagnosis Date  . Anxiety   . Asthma   . Bilateral inguinal hernia 05/29/2016  . Complication of anesthesia    states he stopped breathing one time with anesthesia  . Constipation   . Fatty liver   . GERD (gastroesophageal reflux disease)   . History of kidney stones   . Pneumonia   . PONV (postoperative nausea and vomiting)   . Radicular pain of left lower extremity     Past Surgical History:  Procedure Laterality Date  . ANTERIOR CERVICAL DECOMP/DISCECTOMY FUSION N/A 05/26/2017   Procedure: ANTERIOR CERVICAL DECOMPRESSION/DISCECTOMY FUSION TWO LEVELS Cervical five-six, Cervical six-seven;  Surgeon: Phylliss Bob, MD;  Location: [facility];  Service: Orthopedics;  Laterality: N/A;  . CHOLECYSTECTOMY    . ESOPHAGEAL DILATION N/A 03/24/2017   Procedure: ESOPHAGEAL DILATION;  Surgeon: Rogene Houston, MD;  [facility];  Service: Endoscopy;  Laterality: N/A;  . ESOPHAGOGASTRODUODENOSCOPY N/A 03/24/2017   Procedure: ESOPHAGOGASTRODUODENOSCOPY (EGD);  Surgeon: Rogene Houston, MD;  [facility];  Service: Endoscopy;  Laterality: N/A;  1155  . INGUINAL HERNIA REPAIR Bilateral  05/29/2016   Procedure: OPEN HERNIA REPAIR INGUINAL ADULT BILATERAL WITH insertion of MESH;  Surgeon: Fanny Skates, MD;  Location: [place];  Service: General;  Laterality: Bilateral;  . Lipoma removed      There were no vitals filed for this visit.  Subjective Assessment - 07/02/17 0948    Subjective  Patient stated he was walking around [employer] yesterday with the 2 wheeled walker. Patient reported that he has been doing his exercises.     Pertinent History  Cervical fusion     Currently in Pain?  Yes    Pain Score  7     Pain Location  Neck    Pain Orientation  Left    Pain Descriptors / Indicators  Sharp    Pain Type  Acute pain    Pain Radiating Towards  Radiates down patient's left arm to the elbow    Pain Onset  More than a month ago                       [facility] Adult PT Treatment/Exercise - 07/02/17 0001      Lumbar Exercises: Standing   Heel Raises  Limitations;Other (comment) 16 reps    Heel Raises Limitations  toeraises 16 reps    Functional Squats  15 reps;Other (comment) loss of balance on 1 trial    Forward Lunge  Limitations  Forward Lunge Limitations  16 repetitions on 4" step with 1 UE assist each lower extremity    Side Lunge  10 reps;Limitations    Side Lunge Limitations  4" with 1 UE assist each lower extremity with demonstration, verbal and tactile cues    Other Standing Lumbar Exercises  side stepping with blue theraband x 3 round trips in front of mat with therapist HHA in front. Mat length 7 feet.     Other Standing Lumbar Exercises  Lateral step up onto 4'' step x 10 each lower extremity, 1 UE support. tandem stance and single limb stance 30" X 2 each lower extremity. intermittent upper extremity assist      Knee/Hip Exercises: Seated   Sit to [place]  15 reps;without UE support;Other (comment) From 20 inch mat table             PT Education - 07/02/17 0951    Education provided  Yes    Education Details  Discussed purpose and  technique of exercises throughout session.     Person(s) Educated  Patient    Methods  Explanation;Demonstration;Tactile cues;Verbal cues       PT Short Term Goals - 06/25/17 1015      PT SHORT TERM GOAL #1   Title  Pt to be able to ambulate 226 feet in 3 minute time with least assistive device    Time  3    Period  Weeks    Status  On-going      PT SHORT TERM GOAL #2   Title  Pt to be able to single leg sance for 10" bilaterally to decrease risk of falls     Time  3    Period  Weeks    Status  On-going      PT SHORT TERM GOAL #3   Title  Pt back and LE mobility to increase to be able to don socks and shoes without difficulty.     Time  3    Period  Weeks    Status  On-going      PT SHORT TERM GOAL #4   Title  PT LE strength to have increased B to be able to come sit to stand with one UE assist without difficulty.     Time  3    Period  Weeks    Status  On-going      PT SHORT TERM GOAL #5   Title  Pt to feel confident walkng with a cane inside     Time  3    Period  Weeks    Status  On-going      PT SHORT TERM GOAL #6   Title  When okay by surgeon pt to be completing a HEP for the cervical spine to promote functional ROM     Time  3    Period  Weeks    Status  On-going        PT Long Term Goals - 06/25/17 1015      PT LONG TERM GOAL #1   Title  PT to be able to ambulate 400 feet in three minutes with least assistive device     Time  6    Period  Weeks    Status  On-going      PT LONG TERM GOAL #2   Title  PT to be able to single leg stance for 20" to allow pt to feel comfortable walking in his yard.  Time  6    Period  Weeks    Status  On-going      PT LONG TERM GOAL #3   Title  PT LE and core strength to have increased to allow pt to be able to ascend and descend a flight of steps in a reciprocal manner     Time  6    Period  Weeks    Status  On-going      PT LONG TERM GOAL #4   Title  Pt to feel confident walking in the community without an  assistive device     Time  6    Period  Weeks    Status  On-going      PT LONG TERM GOAL #5   Title  PT cervical ROM to be improved to allow pt to be able to look to his blind spot to feel confident in driving     Time  6    Period  Weeks    Status  On-going            Plan - 07/02/17 1047    Clinical Impression Statement  This session continued to progress patient with functional lower extremity strengthening exercise. This session added single limb stance for up to 30 seconds on each lower extremity with intermittent upper extremity support and added lateral step-ups with patient requiring demonstration, verbal cues, and tactile cues for proper form. Noted patient performed descent with lower extremity held straight and patient was cued to allow knee to bend slightly to absorb forces. With single limb stance it was noted that patient's left lower extremity was more unsteady than right. Patient was provided several therapeutic rest breaks throughout session. Continued to note that patient's left toes are held in extension and patient is unable to relax them. Patient would benefit from continued skilled physical therapy in order to progress patient with strengthening, balance, and overall functional mobility.      Rehab Potential  Good    PT Frequency  3x / week    PT Duration  6 weeks    PT Treatment/Interventions  ADLs/Self Care Home Management;DME Instruction;Stair training;Functional mobility training;Gait training;Therapeutic activities;Therapeutic exercise;Balance training;Neuromuscular re-education;Patient/family education;Manual techniques;Passive range of motion    PT Next Visit Plan  Continue with focus on LE strength and stability until returns to [place] next week for cervical. Next session initiate forward step ups advance balance to challenge proprioception.        Patient will benefit from skilled therapeutic intervention in order to improve the following deficits and  impairments:  Abnormal gait, Decreased activity tolerance, Decreased balance, Decreased coordination, Decreased knowledge of use of DME, Decreased mobility, Decreased strength, Difficulty walking, Impaired perceived functional ability, Pain  Visit Diagnosis: Muscle weakness (generalized)  History of falling  Difficulty in walking, not elsewhere classified  Other abnormalities of gait and mobility     Problem List Patient Active Problem List   Diagnosis Date Noted  . Spinal cord compression ([place])   . Surgery, elective   . Cervical myelopathy ([place])   . Syncope, vasovagal   . Weakness   . Generalized weakness 05/25/2017  . Left sided numbness 05/24/2017  . Right inguinal pain 04/20/2017  . Dysphagia 03/24/2017  . Bilateral inguinal hernia 05/29/2016  . 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 Hyperglycemia 05/18/2013  . Radicular pain of left lower extremity   . Dizziness 05/16/2013   Clarene Critchley PT, DPT 10:50 AM, 07/02/17 [place], [name], [address] Phone: [phone]   Fax:  [phone]  Name: ALBY SCHWABE MRN: [id number] Date of Birth: Feb 20, 1971

## 2017-07-05 ENCOUNTER — Ambulatory Visit (HOSPITAL_COMMUNITY): Payer: BLUE CROSS/BLUE SHIELD | Admitting: Physical Therapy

## 2017-07-05 DIAGNOSIS — R262 Difficulty in walking, not elsewhere classified: Secondary | ICD-10-CM | POA: Diagnosis not present

## 2017-07-05 DIAGNOSIS — Z9181 History of falling: Secondary | ICD-10-CM | POA: Diagnosis not present

## 2017-07-05 DIAGNOSIS — M6281 Muscle weakness (generalized): Secondary | ICD-10-CM

## 2017-07-05 DIAGNOSIS — R2689 Other abnormalities of gait and mobility: Secondary | ICD-10-CM

## 2017-07-05 NOTE — Telephone Encounter (Signed)
Called pt re no show.  Pt had overslept this morning and requested to be called if there is another opening later on in the day.   Zachary Nash, [place] CLT [phone]

## 2017-07-05 NOTE — Therapy (Signed)
[place], [name], [address] Phone: [phone]   Fax:  [phone]  Physical Therapy Treatment  Patient Details  Name: Zachary Nash MRN: [id number] Date of Birth: 10/03/1970 Referring Provider: Allyn Kenner surgeon Phylliss Bob    Encounter Date: 07/05/2017  PT End of Session - 07/05/17 1747    Visit Number  7    Number of Visits  16    Date for PT Re-Evaluation  08/02/17 reassess on 4/15    Authorization - Visit Number  11 pt had home health     Authorization - Number of Visits  30    PT Start Time  2683    PT Stop Time  1735    PT Time Calculation (min)  45 min    Activity Tolerance  Patient tolerated treatment well    Behavior During Therapy  [facility] for tasks assessed/performed       Past Medical History:  Diagnosis Date  . Anxiety   . Asthma   . Bilateral inguinal hernia 05/29/2016  . Complication of anesthesia    states he stopped breathing one time with anesthesia  . Constipation   . Fatty liver   . GERD (gastroesophageal reflux disease)   . History of kidney stones   . Pneumonia   . PONV (postoperative nausea and vomiting)   . Radicular pain of left lower extremity     Past Surgical History:  Procedure Laterality Date  . ANTERIOR CERVICAL DECOMP/DISCECTOMY FUSION N/A 05/26/2017   Procedure: ANTERIOR CERVICAL DECOMPRESSION/DISCECTOMY FUSION TWO LEVELS Cervical five-six, Cervical six-seven;  Surgeon: Phylliss Bob, MD;  Location: [facility];  Service: Orthopedics;  Laterality: N/A;  . CHOLECYSTECTOMY    . ESOPHAGEAL DILATION N/A 03/24/2017   Procedure: ESOPHAGEAL DILATION;  Surgeon: Rogene Houston, MD;  [facility];  Service: Endoscopy;  Laterality: N/A;  . ESOPHAGOGASTRODUODENOSCOPY N/A 03/24/2017   Procedure: ESOPHAGOGASTRODUODENOSCOPY (EGD);  Surgeon: Rogene Houston, MD;  [facility];  Service: Endoscopy;  Laterality: N/A;  1155  . INGUINAL HERNIA REPAIR Bilateral  05/29/2016   Procedure: OPEN HERNIA REPAIR INGUINAL ADULT BILATERAL WITH insertion of MESH;  Surgeon: Fanny Skates, MD;  Location: [facility];  Service: General;  Laterality: Bilateral;  . Lipoma removed      There were no vitals filed for this visit.  Subjective Assessment - 07/05/17 1709    Subjective  Pt states he overslept for his AM appt this morning.  comes today with regular 2 wheeled walker.  States his Lt foot is most worrisome to him as his toes remain in a spasms.  Lt shoulder pain hurting 7/10 and Lt foot 9/10.    Currently in Pain?  Yes    Pain Score  7     Pain Location  Shoulder    Pain Orientation  Left    Pain Descriptors / Indicators  Sharp    Pain Type  Acute pain                       [facility] Adult PT Treatment/Exercise - 07/05/17 0001      Lumbar Exercises: Standing   Heel Raises  20 reps    Heel Raises Limitations  toeraises 20 reps    Forward Lunge  20 reps    Forward Lunge Limitations  4" step with 1 UE assist each lower extremity    Side Lunge  15 reps;Limitations    Side Lunge Limitations  4" with 1 UE assist each lower extremity with demonstration, verbal and tactile cues    Other Standing Lumbar Exercises  side stepping 2RT on blue line with SPC Rt UE7 feet.       Knee/Hip Exercises: Standing   Gait Training  with QC then SPC 226 feet               PT Short Term Goals - 06/25/17 1015      PT SHORT TERM GOAL #1   Title  Pt to be able to ambulate 226 feet in 3 minute time with least assistive device    Time  3    Period  Weeks    Status  On-going      PT SHORT TERM GOAL #2   Title  Pt to be able to single leg sance for 10" bilaterally to decrease risk of falls     Time  3    Period  Weeks    Status  On-going      PT SHORT TERM GOAL #3   Title  Pt back and LE mobility to increase to be able to don socks and shoes without difficulty.     Time  3    Period  Weeks    Status  On-going      PT SHORT TERM GOAL #4   Title  PT LE  strength to have increased B to be able to come sit to stand with one UE assist without difficulty.     Time  3    Period  Weeks    Status  On-going      PT SHORT TERM GOAL #5   Title  Pt to feel confident walkng with a cane inside     Time  3    Period  Weeks    Status  On-going      PT SHORT TERM GOAL #6   Title  When okay by surgeon pt to be completing a HEP for the cervical spine to promote functional ROM     Time  3    Period  Weeks    Status  On-going        PT Long Term Goals - 06/25/17 1015      PT LONG TERM GOAL #1   Title  PT to be able to ambulate 400 feet in three minutes with least assistive device     Time  6    Period  Weeks    Status  On-going      PT LONG TERM GOAL #2   Title  PT to be able to single leg stance for 20" to allow pt to feel comfortable walking in his yard.     Time  6    Period  Weeks    Status  On-going      PT LONG TERM GOAL #3   Title  PT LE and core strength to have increased to allow pt to be able to ascend and descend a flight of steps in a reciprocal manner     Time  6    Period  Weeks    Status  On-going      PT LONG TERM GOAL #4   Title  Pt to feel confident walking in the community without an assistive device     Time  6    Period  Weeks    Status  On-going      PT LONG TERM GOAL #5  Title  PT cervical ROM to be improved to allow pt to be able to look to his blind spot to feel confident in driving     Time  6    Period  Weeks    Status  On-going            Plan - 07/05/17 1748    Clinical Impression Statement  Began session working on gait decreasing AD to QC then to [facility].  Pt prefers SPC and displays overall safety and stabiltiy using.  Pt does require verbal cues for sequencing and heel to toe gait. Progressed dynamic stability side stepping out to line this session with use of SPC.   Able to complete additional therex with increased reps this session.      Rehab Potential  Good    PT Frequency  3x / week    PT  Duration  6 weeks    PT Treatment/Interventions  ADLs/Self Care Home Management;DME Instruction;Stair training;Functional mobility training;Gait training;Therapeutic activities;Therapeutic exercise;Balance training;Neuromuscular re-education;Patient/family education;Manual techniques;Passive range of motion    PT Next Visit Plan  Returns to MD tomorrow for cervical check up.  complete cervical assessment if MD agrees and removes collar.        Patient will benefit from skilled therapeutic intervention in order to improve the following deficits and impairments:  Abnormal gait, Decreased activity tolerance, Decreased balance, Decreased coordination, Decreased knowledge of use of DME, Decreased mobility, Decreased strength, Difficulty walking, Impaired perceived functional ability, Pain  Visit Diagnosis: Muscle weakness (generalized)  History of falling  Difficulty in walking, not elsewhere classified  Other abnormalities of gait and mobility     Problem List Patient Active Problem List   Diagnosis Date Noted  . Spinal cord compression ([place])   . Surgery, elective   . Cervical myelopathy ([place])   . Syncope, vasovagal   . Weakness   . Generalized weakness 05/25/2017  . Left sided numbness 05/24/2017  . Right inguinal pain 04/20/2017  . Dysphagia 03/24/2017  . Bilateral inguinal hernia 05/29/2016  . 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 Hyperglycemia 05/18/2013  . Radicular pain of left lower extremity   . Dizziness 05/16/2013   Teena Irani, PTA/CLT [id number]  Teena Irani 07/05/2017, 5:55 PM  [facility] [address] [place], [name], [address] Phone: [phone]   Fax:  [phone]  Name: Zachary Nash MRN: [id number] Date of Birth: [birth year]

## 2017-07-06 DIAGNOSIS — G549 Nerve root and plexus disorder, unspecified: Secondary | ICD-10-CM | POA: Diagnosis not present

## 2017-07-07 ENCOUNTER — Ambulatory Visit (INDEPENDENT_AMBULATORY_CARE_PROVIDER_SITE_OTHER): Payer: Self-pay | Admitting: Internal Medicine

## 2017-07-07 ENCOUNTER — Ambulatory Visit (HOSPITAL_COMMUNITY): Payer: BLUE CROSS/BLUE SHIELD

## 2017-07-07 ENCOUNTER — Encounter (HOSPITAL_COMMUNITY): Payer: Self-pay

## 2017-07-07 ENCOUNTER — Encounter (INDEPENDENT_AMBULATORY_CARE_PROVIDER_SITE_OTHER): Payer: Self-pay | Admitting: Internal Medicine

## 2017-07-07 DIAGNOSIS — Z9181 History of falling: Secondary | ICD-10-CM | POA: Diagnosis not present

## 2017-07-07 DIAGNOSIS — R2689 Other abnormalities of gait and mobility: Secondary | ICD-10-CM | POA: Diagnosis not present

## 2017-07-07 DIAGNOSIS — R262 Difficulty in walking, not elsewhere classified: Secondary | ICD-10-CM

## 2017-07-07 DIAGNOSIS — M6281 Muscle weakness (generalized): Secondary | ICD-10-CM | POA: Diagnosis not present

## 2017-07-07 NOTE — Therapy (Signed)
[place], [name], [address] Phone: [phone]   Fax:  [phone]  Physical Therapy Treatment  Patient Details  Name: Zachary Nash MRN: [id number] Date of Birth: 1970/04/29 Referring Provider: Allyn Kenner surgeon Phylliss Bob    Encounter Date: 07/07/2017  PT End of Session - 07/07/17 1130    Visit Number  8    Number of Visits  16    Date for PT Re-Evaluation  08/02/17 reassess 07/12/17    Authorization - Visit Number  12 had [facility]    Authorization - Number of Visits  30    PT Start Time  1120    PT Stop Time  1207    PT Time Calculation (min)  47 min    Activity Tolerance  Patient tolerated treatment well    Behavior During Therapy  [facility] for tasks assessed/performed       Past Medical History:  Diagnosis Date  . Anxiety   . Asthma   . Bilateral inguinal hernia 05/29/2016  . Complication of anesthesia    states he stopped breathing one time with anesthesia  . Constipation   . Fatty liver   . GERD (gastroesophageal reflux disease)   . History of kidney stones   . Pneumonia   . PONV (postoperative nausea and vomiting)   . Radicular pain of left lower extremity     Past Surgical History:  Procedure Laterality Date  . ANTERIOR CERVICAL DECOMP/DISCECTOMY FUSION N/A 05/26/2017   Procedure: ANTERIOR CERVICAL DECOMPRESSION/DISCECTOMY FUSION TWO LEVELS Cervical five-six, Cervical six-seven;  Surgeon: Phylliss Bob, MD;  Location: [facility];  Service: Orthopedics;  Laterality: N/A;  . CHOLECYSTECTOMY    . ESOPHAGEAL DILATION N/A 03/24/2017   Procedure: ESOPHAGEAL DILATION;  Surgeon: Rogene Houston, MD;  [facility];  Service: Endoscopy;  Laterality: N/A;  . ESOPHAGOGASTRODUODENOSCOPY N/A 03/24/2017   Procedure: ESOPHAGOGASTRODUODENOSCOPY (EGD);  Surgeon: Rogene Houston, MD;  [facility];  Service: Endoscopy;  Laterality: N/A;  1155  . INGUINAL HERNIA REPAIR Bilateral 05/29/2016   Procedure: OPEN HERNIA REPAIR INGUINAL ADULT BILATERAL WITH insertion of MESH;  Surgeon: Fanny Skates, MD;  Location: [facility];  Service: General;  Laterality: Bilateral;  . Lipoma removed      There were no vitals filed for this visit.  Subjective Assessment - 07/07/17 1123    Subjective  Pt arrived walking with short SPC and reports of increased Rt shoulder and neck pain.  Pt without neck brace today and order to begin eval/tx neck and UE.  Current pain scale 6/10 Rt shoulder and Bil neck.  Stated "neck muslces woke up this morning" with lots of spasms and Lt calf muscles.  Plans for an orthopedic apt on Friday to address Lt ankle/foot.      Patient Stated Goals  To get as much function as he can, walk without assistive device.  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     Currently in Pain?  Yes    Pain Score  6     Pain Location  Shoulder    Pain Orientation  Left    Pain Descriptors / Indicators  Sharp;Aching;Sore    Pain Type  Acute pain    Pain Radiating Towards  radiates down pt's left arm to the elbow    Pain Onset  More than a month ago    Pain Frequency  Intermittent    Aggravating  Factors   nothing    Pain Relieving Factors  nothing    Effect of Pain on Daily Activities  limits                       [facility] Adult PT Treatment/Exercise - 07/07/17 0001      Lumbar Exercises: Seated   Sit to Stand  10 reps;Limitations no HHA, eccentric control      Knee/Hip Exercises: Standing   Heel Raises  20 reps Toe raises    Forward Lunges  20 reps onto 4in with minimal HHA    Rocker Board  2 minutes Df/PF and lateral    Gait Training  SPC 2x 226 ft to improve stride length    Other Standing Knee Exercises  2RT with RTB, no AD               PT Short Term Goals - 06/25/17 1015      PT SHORT TERM GOAL #1   Title  Pt to be able to ambulate 226 feet in 3 minute time with least assistive device    Time  3    Period  Weeks    Status   On-going      PT SHORT TERM GOAL #2   Title  Pt to be able to single leg sance for 10" bilaterally to decrease risk of falls     Time  3    Period  Weeks    Status  On-going      PT SHORT TERM GOAL #3   Title  Pt back and LE mobility to increase to be able to don socks and shoes without difficulty.     Time  3    Period  Weeks    Status  On-going      PT SHORT TERM GOAL #4   Title  PT LE strength to have increased B to be able to come sit to stand with one UE assist without difficulty.     Time  3    Period  Weeks    Status  On-going      PT SHORT TERM GOAL #5   Title  Pt to feel confident walkng with a cane inside     Time  3    Period  Weeks    Status  On-going      PT SHORT TERM GOAL #6   Title  When okay by surgeon pt to be completing a HEP for the cervical spine to promote functional ROM     Time  3    Period  Weeks    Status  On-going        PT Long Term Goals - 06/25/17 1015      PT LONG TERM GOAL #1   Title  PT to be able to ambulate 400 feet in three minutes with least assistive device     Time  6    Period  Weeks    Status  On-going      PT LONG TERM GOAL #2   Title  PT to be able to single leg stance for 20" to allow pt to feel comfortable walking in his yard.     Time  6    Period  Weeks    Status  On-going      PT LONG TERM GOAL #3   Title  PT LE and core strength to have increased to allow pt to be able to  ascend and descend a flight of steps in a reciprocal manner     Time  6    Period  Weeks    Status  On-going      PT LONG TERM GOAL #4   Title  Pt to feel confident walking in the community without an assistive device     Time  6    Period  Weeks    Status  On-going      PT LONG TERM GOAL #5   Title  PT cervical ROM to be improved to allow pt to be able to look to his blind spot to feel confident in driving     Time  6    Period  Weeks    Status  On-going            Plan - 07/07/17 1218    Clinical Impression Statement  Pt  arrived with [facility] that was unadjustable and too short, Rt side bend with gait.  Educated importance of proper cane height with gait, pt stated he plans to purchase later today.  Gait training to improve sequence and moderate cueing for equal stride length and heel to toe mechanics.  Added rockerboard to improve ankle mechanics.  Continued with functional strengthneing activities and balance to improve stance phase with gait.  Pt educated on importance of posture to reduce neck pain and assist with balance.  No reports of increased pain thorugh session.  Pt with multiple spasms through session in Lt LE and cueing to relax upper traps.      Rehab Potential  Good    PT Frequency  3x / week    PT Duration  6 weeks    PT Treatment/Interventions  ADLs/Self Care Home Management;DME Instruction;Stair training;Functional mobility training;Gait training;Therapeutic activities;Therapeutic exercise;Balance training;Neuromuscular re-education;Patient/family education;Manual techniques;Passive range of motion    PT Next Visit Plan  Received order to eval/treat: Passive for neck and UE's until 07/24/17 then progress as tolerated without restrictions.         Patient will benefit from skilled therapeutic intervention in order to improve the following deficits and impairments:  Abnormal gait, Decreased activity tolerance, Decreased balance, Decreased coordination, Decreased knowledge of use of DME, Decreased mobility, Decreased strength, Difficulty walking, Impaired perceived functional ability, Pain  Visit Diagnosis: Muscle weakness (generalized)  History of falling  Difficulty in walking, not elsewhere classified  Other abnormalities of gait and mobility     Problem List Patient Active Problem List   Diagnosis Date Noted  . Spinal cord compression ([place])   . Surgery, elective   . Cervical myelopathy ([place])   . Syncope, vasovagal   . Weakness   . Generalized weakness 05/25/2017  . Left sided numbness  05/24/2017  . Right inguinal pain 04/20/2017  . Dysphagia 03/24/2017  . Bilateral inguinal hernia 05/29/2016  . 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 Hyperglycemia 05/18/2013  . Radicular pain of left lower extremity   . Dizziness 05/16/2013   Ihor Austin, LPTA; [place]  Aldona Lento 07/07/2017, 12:28 PM  [facility] [address] [place], [name], [address] Phone: [phone]   Fax:  [phone]  Name: Zachary Nash MRN: [id number] Date of Birth: 11/30/1970

## 2017-07-09 ENCOUNTER — Ambulatory Visit (HOSPITAL_COMMUNITY): Payer: BLUE CROSS/BLUE SHIELD

## 2017-07-09 ENCOUNTER — Telehealth (HOSPITAL_COMMUNITY): Payer: Self-pay

## 2017-07-09 ENCOUNTER — Encounter (HOSPITAL_COMMUNITY): Payer: Self-pay

## 2017-07-09 DIAGNOSIS — R262 Difficulty in walking, not elsewhere classified: Secondary | ICD-10-CM | POA: Diagnosis not present

## 2017-07-09 DIAGNOSIS — M6281 Muscle weakness (generalized): Secondary | ICD-10-CM | POA: Diagnosis not present

## 2017-07-09 DIAGNOSIS — R2689 Other abnormalities of gait and mobility: Secondary | ICD-10-CM | POA: Diagnosis not present

## 2017-07-09 DIAGNOSIS — Z9181 History of falling: Secondary | ICD-10-CM | POA: Diagnosis not present

## 2017-07-09 DIAGNOSIS — G549 Nerve root and plexus disorder, unspecified: Secondary | ICD-10-CM | POA: Diagnosis not present

## 2017-07-09 NOTE — Therapy (Signed)
[place], [name], [address] Phone: [phone]   Fax:  [phone]     Progress Note Reporting Period 06/21/17 to 07/09/17  See note below for Objective Data and Assessment of Progress/Goals.     Physical Therapy Treatment  Patient Details  Name: Zachary Nash MRN: [id number] Date of Birth: 1970-05-12 Referring Provider: Allyn Kenner surgeon Phylliss Bob   Encounter Date: 07/09/2017  PT End of Session - 07/09/17 1133    Visit Number  9    Number of Visits  16    Date for PT Re-Evaluation  08/02/17    Authorization - Visit Number  69 had HHPT    Authorization - Number of Visits  30    PT Start Time  8466    PT Stop Time  1209    PT Time Calculation (min)  38 min    Activity Tolerance  Patient tolerated treatment well    Behavior During Therapy  [facility] for tasks assessed/performed       Past Medical History:  Diagnosis Date  . Anxiety   . Asthma   . Bilateral inguinal hernia 05/29/2016  . Complication of anesthesia    states he stopped breathing one time with anesthesia  . Constipation   . Fatty liver   . GERD (gastroesophageal reflux disease)   . History of kidney stones   . Pneumonia   . PONV (postoperative nausea and vomiting)   . Radicular pain of left lower extremity     Past Surgical History:  Procedure Laterality Date  . ANTERIOR CERVICAL DECOMP/DISCECTOMY FUSION N/A 05/26/2017   Procedure: ANTERIOR CERVICAL DECOMPRESSION/DISCECTOMY FUSION TWO LEVELS Cervical five-six, Cervical six-seven;  Surgeon: Phylliss Bob, MD;  Location: [place];  Service: Orthopedics;  Laterality: N/A;  . CHOLECYSTECTOMY    . ESOPHAGEAL DILATION N/A 03/24/2017   Procedure: ESOPHAGEAL DILATION;  Surgeon: Rogene Houston, MD;  [facility];  Service: Endoscopy;  Laterality: N/A;  . ESOPHAGOGASTRODUODENOSCOPY N/A 03/24/2017   Procedure: ESOPHAGOGASTRODUODENOSCOPY (EGD);  Surgeon: Rogene Houston, MD;   [facility];  Service: Endoscopy;  Laterality: N/A;  1155  . INGUINAL HERNIA REPAIR Bilateral 05/29/2016   Procedure: OPEN HERNIA REPAIR INGUINAL ADULT BILATERAL WITH insertion of MESH;  Surgeon: Fanny Skates, MD;  Location: [place];  Service: General;  Laterality: Bilateral;  . Lipoma removed      There were no vitals filed for this visit.  Subjective Assessment - 07/09/17 1133    Subjective  Pt states that he saw his MD this morning and stated that he does have nerve damage in his L foot. He is in a walking boot; he's not sure how long. He states that he has to have a nerve test done.     Patient Stated Goals  To get as much function as he can, walk without assistive device.  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     Currently in Pain?  Yes    Pain Score  5     Pain Location  Generalized    Pain Orientation  Left    Pain Descriptors / Indicators  Stabbing    Pain Type  Acute pain    Pain Onset  More than a month ago    Pain Frequency  Intermittent    Aggravating Factors   nothing    Pain Relieving Factors  nothing    Effect of  Pain on Daily Activities  limits           [facility] PT Assessment - 07/09/17 0001      Assessment   Medical Diagnosis  ACDF C5-6; C 6-7    Referring Provider  Allyn Kenner surgeon Phylliss Bob    Onset Date/Surgical Date  05/26/17    Next MD Visit  -- will have nerve test soon    Prior Therapy  [facility]       Precautions   Required Braces or Orthoses  Other Brace/Splint    Other Brace/Splint  walking boot LLE      Functional Tests   Functional tests  Single leg stance;Sit to Stand      Single Leg Stance   Comments  30 sec RLE, 3 sec LLE, very unsteady on LLE      Sit to Stand   Comments  able to perform with RUE support      ROM / Strength   AROM / PROM / Strength  PROM      PROM   Overall PROM Comments  all PROM measurements performed with pt in supine    PROM Assessment Site  Shoulder;Elbow;Cervical    Right  Shoulder Flexion  115 Degrees    Right Shoulder ABduction  85 Degrees    Right Shoulder Internal Rotation  56 Degrees    Right Shoulder External Rotation  30 Degrees    Left Shoulder Flexion  0 Degrees neutral    Left Shoulder ABduction  20 Degrees    Left Shoulder Internal Rotation  4 Degrees    Left Shoulder External Rotation  5 Degrees    Left Elbow Flexion  100    Left Elbow Extension  -15    Cervical Flexion  35    Cervical Extension  -12 still in flexion    Cervical - Right Side Bend  10    Cervical - Left Side Bend  8    Cervical - Right Rotation  10    Cervical - Left Rotation  10      Ambulation/Gait   Ambulation Distance (Feet)  206 Feet 3MWT    Assistive device  Small based quad cane walking boot    Gait Comments  2 brief standing rest breaks            PT Education - 07/09/17 1506    Education provided  Yes    Education Details  reassessment findings; importance of relaxation during PROM    Person(s) Educated  Patient    Methods  Explanation;Demonstration    Comprehension  Verbalized understanding;Returned demonstration       PT Short Term Goals - 07/09/17 1137      PT SHORT TERM GOAL #1   Title  Pt to be able to ambulate 226 feet in 3 minute time with least assistive device    Baseline  4/12: 263f SBQC, 2 standing rest breaks    Time  3    Period  Weeks    Status  On-going      PT SHORT TERM GOAL #2   Title  Pt to be able to single leg sance for 10" bilaterally to decrease risk of falls     Baseline  4/12: RLE 30 sec, LLE 3 sec, very unsteady on LLE    Time  3    Period  Weeks    Status  Partially Met      PT SHORT TERM GOAL #3   Title  Pt back and LE mobility to increase to be able to don socks and shoes without difficulty.     Baseline  4/12: still difficult    Time  3    Period  Weeks    Status  On-going      PT SHORT TERM GOAL #4   Title  PT LE strength to have increased B to be able to come sit to stand with one UE assist without  difficulty.     Baseline  4/12: able to perform STS with RUE support    Time  3    Period  Weeks    Status  Achieved      PT SHORT TERM GOAL #5   Title  Pt to feel confident walkng with a cane inside     Baseline  4/12: feels confident with cane inside    Time  3    Period  Weeks    Status  Achieved      PT SHORT TERM GOAL #6   Title  When okay by surgeon pt to be completing a HEP for the cervical spine to promote functional ROM     Baseline  4/12: per most recent MD order, "Passive for neck and UE's until 07/24/17 then progress as tolerated without restrictions"    Time  3    Period  Weeks    Status  On-going        PT Long Term Goals - 07/09/17 1138      PT LONG TERM GOAL #1   Title  PT to be able to ambulate 400 feet in three minutes with least assistive device     Baseline  4/12: 231f SBQC, 2 standing rest breaks    Time  6    Period  Weeks    Status  On-going      PT LONG TERM GOAL #2   Title  PT to be able to single leg stance for 20" to allow pt to feel comfortable walking in his yard.     Baseline  /12: RLE 30 sec, LLE 3 sec, very unsteady on LLE    Time  6    Period  Weeks    Status  Partially Met      PT LONG TERM GOAL #3   Title  PT LE and core strength to have increased to allow pt to be able to ascend and descend a flight of steps in a reciprocal manner     Baseline  4/12: step-to with BUE support    Time  6    Period  Weeks    Status  On-going      PT LONG TERM GOAL #4   Title  Pt to feel confident walking in the community without an assistive device     Baseline  4/12: still utilizing SBQC    Time  6    Period  Weeks    Status  On-going      PT LONG TERM GOAL #5   Title  PT cervical ROM to be improved to allow pt to be able to look to his blind spot to feel confident in driving     Baseline  4/12: MD cleared pt to drive but states that he is scared to turn his neck     Time  6    Period  Weeks    Status  On-going            Plan -  07/09/17  1506    Clinical Impression Statement  Pt presented to session this date in CAM walking boot stating he had just seen his MD who told him that he did have nerve damage in that foot; he is not sure how long he has to stay in the boot. PT reassessed pt's goals and outcome measures this date and performed assessment of cervical and UE PROM. Pt has overall made great progress towards goals thus far as illustrated above. He was able to stand on his RLE for 30 sec and LLE for 3 sec, he was able to perform a STS transfer with only 1 UE support, and he now feels confident ambulating with cane inside (PT adjusted this date as he had the bottom of the can backwards). He is still impaired in multiple aspects AEB his difficulty donning socks and shoes, ambulating stairs reciprocally, and his overall gait and intolerance to cervical and LUE/LLE movements. His cervical PROM is severely impaired as PT was only able to laterally flex and rotate his head 10 deg before having increased pain and L sided radicular symptoms. However, PT noted that when pt is in conversation with others, his cervical rotation ROM was noted to be greater than 10deg; PT informed pt of this and educated him on the importance of relaxation so as to improve his tolerance to movement passively in order to ultimately improve his AROM. LUE also significantly limited in ROM throughout and also causing pain and radicular symptoms. Pt needs continued skilled PT intervention to address these remaining impairments in order to decrease pain and improve overall function and QOL.    Rehab Potential  Good    PT Frequency  3x / week    PT Duration  6 weeks    PT Treatment/Interventions  ADLs/Self Care Home Management;DME Instruction;Stair training;Functional mobility training;Gait training;Therapeutic activities;Therapeutic exercise;Balance training;Neuromuscular re-education;Patient/family education;Manual techniques;Passive range of motion    PT Next  Visit Plan  begin gentle passive cervical and BUE (L>R) ROM until 07/23/17; continue with LE strength and stability    Consulted and Agree with Plan of Care  Patient       Patient will benefit from skilled therapeutic intervention in order to improve the following deficits and impairments:  Abnormal gait, Decreased activity tolerance, Decreased balance, Decreased coordination, Decreased knowledge of use of DME, Decreased mobility, Decreased strength, Difficulty walking, Impaired perceived functional ability, Pain  Visit Diagnosis: Muscle weakness (generalized)  History of falling  Difficulty in walking, not elsewhere classified  Other abnormalities of gait and mobility     Problem List Patient Active Problem List   Diagnosis Date Noted  . Spinal cord compression ([place])   . Surgery, elective   . Cervical myelopathy ([place])   . Syncope, vasovagal   . Weakness   . Generalized weakness 05/25/2017  . Left sided numbness 05/24/2017  . Right inguinal pain 04/20/2017  . Dysphagia 03/24/2017  . Bilateral inguinal hernia 05/29/2016  . 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 Hyperglycemia 05/18/2013  . Radicular pain of left lower extremity   . Dizziness 05/16/2013       Geraldine Solar PT, DPT  [place] [address] [place], [name], [address] Phone: [phone]   Fax:  [phone]  Name: Zachary Nash MRN: [id number] Date of Birth: [birth year]

## 2017-07-09 NOTE — Telephone Encounter (Signed)
Asked PT for patient if he was to do any home exercises - Zachary Nash said only do what he's been doing, do not change anything at this time. NF 07/09/17

## 2017-07-12 ENCOUNTER — Telehealth (HOSPITAL_COMMUNITY): Payer: Self-pay | Admitting: Internal Medicine

## 2017-07-12 ENCOUNTER — Telehealth (HOSPITAL_COMMUNITY): Payer: Self-pay | Admitting: Physical Therapy

## 2017-07-12 ENCOUNTER — Encounter (HOSPITAL_COMMUNITY): Payer: Self-pay

## 2017-07-12 ENCOUNTER — Ambulatory Visit (HOSPITAL_COMMUNITY): Payer: BLUE CROSS/BLUE SHIELD

## 2017-07-12 DIAGNOSIS — M6281 Muscle weakness (generalized): Secondary | ICD-10-CM | POA: Diagnosis not present

## 2017-07-12 DIAGNOSIS — R2689 Other abnormalities of gait and mobility: Secondary | ICD-10-CM

## 2017-07-12 DIAGNOSIS — Z9181 History of falling: Secondary | ICD-10-CM

## 2017-07-12 DIAGNOSIS — R262 Difficulty in walking, not elsewhere classified: Secondary | ICD-10-CM

## 2017-07-12 DIAGNOSIS — Z6833 Body mass index (BMI) 33.0-33.9, adult: Secondary | ICD-10-CM | POA: Diagnosis not present

## 2017-07-12 DIAGNOSIS — M5416 Radiculopathy, lumbar region: Secondary | ICD-10-CM | POA: Diagnosis not present

## 2017-07-12 NOTE — Telephone Encounter (Signed)
Patient changed his appt time

## 2017-07-12 NOTE — Therapy (Signed)
[place], [name], [address] Phone: [phone]   Fax:  [phone]  Physical Therapy Treatment  Patient Details  Name: Zachary Nash MRN: [id number] Date of Birth: 1971/01/04 Referring Provider: Allyn Kenner surgeon Phylliss Bob   Encounter Date: 07/12/2017  PT End of Session - 07/12/17 1428    Visit Number  10    Number of Visits  16    Date for PT Re-Evaluation  08/02/17    Authorization - Visit Number  22 had HHPT    Authorization - Number of Visits  30    PT Start Time  1346    PT Stop Time  1427    PT Time Calculation (min)  41 min    Activity Tolerance  Patient tolerated treatment well    Behavior During Therapy  [facility] for tasks assessed/performed       Past Medical History:  Diagnosis Date  . Anxiety   . Asthma   . Bilateral inguinal hernia 05/29/2016  . Complication of anesthesia    states he stopped breathing one time with anesthesia  . Constipation   . Fatty liver   . GERD (gastroesophageal reflux disease)   . History of kidney stones   . Pneumonia   . PONV (postoperative nausea and vomiting)   . Radicular pain of left lower extremity     Past Surgical History:  Procedure Laterality Date  . ANTERIOR CERVICAL DECOMP/DISCECTOMY FUSION N/A 05/26/2017   Procedure: ANTERIOR CERVICAL DECOMPRESSION/DISCECTOMY FUSION TWO LEVELS Cervical five-six, Cervical six-seven;  Surgeon: Phylliss Bob, MD;  Location: [place];  Service: Orthopedics;  Laterality: N/A;  . CHOLECYSTECTOMY    . ESOPHAGEAL DILATION N/A 03/24/2017   Procedure: ESOPHAGEAL DILATION;  Surgeon: Rogene Houston, MD;  [facility];  Service: Endoscopy;  Laterality: N/A;  . ESOPHAGOGASTRODUODENOSCOPY N/A 03/24/2017   Procedure: ESOPHAGOGASTRODUODENOSCOPY (EGD);  Surgeon: Rogene Houston, MD;  [facility];  Service: Endoscopy;  Laterality: N/A;  1155  . INGUINAL HERNIA REPAIR Bilateral 05/29/2016   Procedure: OPEN HERNIA  REPAIR INGUINAL ADULT BILATERAL WITH insertion of MESH;  Surgeon: Fanny Skates, MD;  Location: [place];  Service: General;  Laterality: Bilateral;  . Lipoma removed      There were no vitals filed for this visit.  Subjective Assessment - 07/12/17 1347    Subjective  Pt states he feels okay. He was sore following his reassessment on Friday. He states he was able to walk around [employer] Saturday. He just saw Dr. [place] Crane who wants him to try Lyrica for his pain. He was put on steroids and baclofen Friday.     Patient Stated Goals  To get as much function as he can, walk without assistive device.  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     Currently in Pain?  Yes    Pain Score  6     Pain Location  Neck    Pain Orientation  Left    Pain Descriptors / Indicators  Stabbing    Pain Type  Acute pain    Pain Radiating Towards  neck into L shoulder    Pain Onset  More than a month ago    Pain Frequency  Intermittent    Aggravating Factors   nothing    Pain Relieving Factors  nothing    Effect of Pain on Daily Activities  limits  [facility] PT Assessment - 07/12/17 0001      Precautions   Other Brace/Splint  walking boot LLE      [facility] Adult PT Treatment/Exercise - 07/12/17 0001      Exercises   Exercises  Neck      Lumbar Exercises: Standing   Heel Raises  15 reps    Heel Raises Limitations  heel and toe on foam    Forward Lunge  20 reps    Forward Lunge Limitations  4" step with 1 UE assist each lower extremity    Side Lunge  20 reps    Side Lunge Limitations  4" with 1 UE assist each lower extremity with demonstration, verbal and tactile cues      Knee/Hip Exercises: Standing   Rocker Board  2 minutes;Limitations    Rocker Board Limitations  R/L, A/P    Gait Training  x1 lap around gym with [facility] focusing on heel strike and L arm swing      Manual Therapy   Manual Therapy  Passive ROM    Manual therapy comments  completed separate rest of treatment     Passive ROM  cervical PROM all directions, L GH flexion, abd, IR, ER PROM x15 reps each             PT Education - 07/12/17 1429    Education provided  Yes    Education Details  relaxation during PROM; arm swing with gait    Person(s) Educated  Patient    Methods  Explanation;Demonstration    Comprehension  Verbalized understanding;Returned demonstration       PT Short Term Goals - 07/09/17 1137      PT SHORT TERM GOAL #1   Title  Pt to be able to ambulate 226 feet in 3 minute time with least assistive device    Baseline  4/12: 227f SBQC, 2 standing rest breaks    Time  3    Period  Weeks    Status  On-going      PT SHORT TERM GOAL #2   Title  Pt to be able to single leg sance for 10" bilaterally to decrease risk of falls     Baseline  4/12: RLE 30 sec, LLE 3 sec, very unsteady on LLE    Time  3    Period  Weeks    Status  Partially Met      PT SHORT TERM GOAL #3   Title  Pt back and LE mobility to increase to be able to don socks and shoes without difficulty.     Baseline  4/12: still difficult    Time  3    Period  Weeks    Status  On-going      PT SHORT TERM GOAL #4   Title  PT LE strength to have increased B to be able to come sit to stand with one UE assist without difficulty.     Baseline  4/12: able to perform STS with RUE support    Time  3    Period  Weeks    Status  Achieved      PT SHORT TERM GOAL #5   Title  Pt to feel confident walkng with a cane inside     Baseline  4/12: feels confident with cane inside    Time  3    Period  Weeks    Status  Achieved      PT SHORT TERM GOAL #6  Title  When okay by surgeon pt to be completing a HEP for the cervical spine to promote functional ROM     Baseline  4/12: per most recent MD order, "Passive for neck and UE's until 07/24/17 then progress as tolerated without restrictions"    Time  3    Period  Weeks    Status  On-going        PT Long Term Goals - 07/09/17 1138      PT LONG TERM GOAL #1    Title  PT to be able to ambulate 400 feet in three minutes with least assistive device     Baseline  4/12: 2100f SBQC, 2 standing rest breaks    Time  6    Period  Weeks    Status  On-going      PT LONG TERM GOAL #2   Title  PT to be able to single leg stance for 20" to allow pt to feel comfortable walking in his yard.     Baseline  /12: RLE 30 sec, LLE 3 sec, very unsteady on LLE    Time  6    Period  Weeks    Status  Partially Met      PT LONG TERM GOAL #3   Title  PT LE and core strength to have increased to allow pt to be able to ascend and descend a flight of steps in a reciprocal manner     Baseline  4/12: step-to with BUE support    Time  6    Period  Weeks    Status  On-going      PT LONG TERM GOAL #4   Title  Pt to feel confident walking in the community without an assistive device     Baseline  4/12: still utilizing SBQC    Time  6    Period  Weeks    Status  On-going      PT LONG TERM GOAL #5   Title  PT cervical ROM to be improved to allow pt to be able to look to his blind spot to feel confident in driving     Baseline  4/12: MD cleared pt to drive but states that he is scared to turn his neck     Time  6    Period  Weeks    Status  On-going            Plan - 07/12/17 1428    Clinical Impression Statement  Began this session with gentle PROM to cervical spine and L GH joint with pt in supine. Pt continues to have increased sensitivity to movement throughout both cervical spine and GH joint, however, PT noted to be able to move pt's L GH joint through slightly more PROM than during last session. Ended session with LE strength and stability work; pt generally having increased difficulty and pain with LLE throughout all activities. He has his nerve test 08/18/17 [facility] Continue POC as planned    Rehab Potential  Good    PT Frequency  3x / week    PT Duration  6 weeks    PT Treatment/Interventions  ADLs/Self Care Home  Management;DME Instruction;Stair training;Functional mobility training;Gait training;Therapeutic activities;Therapeutic exercise;Balance training;Neuromuscular re-education;Patient/family education;Manual techniques;Passive range of motion    PT Next Visit Plan  continue gentle passive cervical and BUE (L>R) ROM until 07/23/17; continue with LE strength and stability    Consulted and Agree with Plan  of Care  Patient       Patient will benefit from skilled therapeutic intervention in order to improve the following deficits and impairments:  Abnormal gait, Decreased activity tolerance, Decreased balance, Decreased coordination, Decreased knowledge of use of DME, Decreased mobility, Decreased strength, Difficulty walking, Impaired perceived functional ability, Pain  Visit Diagnosis: Muscle weakness (generalized)  History of falling  Difficulty in walking, not elsewhere classified  Other abnormalities of gait and mobility     Problem List Patient Active Problem List   Diagnosis Date Noted  . Spinal cord compression ([place])   . Surgery, elective   . Cervical myelopathy ([place])   . Syncope, vasovagal   . Weakness   . Generalized weakness 05/25/2017  . Left sided numbness 05/24/2017  . Right inguinal pain 04/20/2017  . Dysphagia 03/24/2017  . Bilateral inguinal hernia 05/29/2016  . 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 Hyperglycemia 05/18/2013  . Radicular pain of left lower extremity   . Dizziness 05/16/2013       Geraldine Solar PT, DPT  [facility] [address] [place], [name], [address] Phone: [phone]   Fax:  [phone]  Name: PIETER FOOKS MRN: [id number] Date of Birth: February 13, 1971

## 2017-07-12 NOTE — Telephone Encounter (Signed)
07/31/86  he had a conflict today and wanted a later appt but it was not available so he said to schedule for Tuesday if we had anything and I RS this appt to 11:15

## 2017-07-13 ENCOUNTER — Encounter (HOSPITAL_COMMUNITY): Payer: Self-pay | Admitting: Physical Therapy

## 2017-07-14 ENCOUNTER — Ambulatory Visit (HOSPITAL_COMMUNITY): Payer: BLUE CROSS/BLUE SHIELD | Admitting: Physical Therapy

## 2017-07-14 DIAGNOSIS — R262 Difficulty in walking, not elsewhere classified: Secondary | ICD-10-CM

## 2017-07-14 DIAGNOSIS — Z9181 History of falling: Secondary | ICD-10-CM | POA: Diagnosis not present

## 2017-07-14 DIAGNOSIS — R2689 Other abnormalities of gait and mobility: Secondary | ICD-10-CM

## 2017-07-14 DIAGNOSIS — M6281 Muscle weakness (generalized): Secondary | ICD-10-CM | POA: Diagnosis not present

## 2017-07-14 NOTE — Therapy (Signed)
[place], [name], [address] Phone: [phone]   Fax:  [phone]  Physical Therapy Treatment  Patient Details  Name: Zachary Nash MRN: [id number] Date of Birth: 04/04/1970 Referring Provider: Allyn Kenner surgeon Phylliss Bob   Encounter Date: 07/14/2017  PT End of Session - 07/14/17 1203    Visit Number  11    Number of Visits  16    Date for PT Re-Evaluation  08/02/17    Authorization - Visit Number  37 had HHPT    Authorization - Number of Visits  30    PT Start Time  0945    PT Stop Time  1030    PT Time Calculation (min)  45 min    Activity Tolerance  Patient tolerated treatment well    Behavior During Therapy  [facility] for tasks assessed/performed       Past Medical History:  Diagnosis Date  . Anxiety   . Asthma   . Bilateral inguinal hernia 05/29/2016  . Complication of anesthesia    states he stopped breathing one time with anesthesia  . Constipation   . Fatty liver   . GERD (gastroesophageal reflux disease)   . History of kidney stones   . Pneumonia   . PONV (postoperative nausea and vomiting)   . Radicular pain of left lower extremity     Past Surgical History:  Procedure Laterality Date  . ANTERIOR CERVICAL DECOMP/DISCECTOMY FUSION N/A 05/26/2017   Procedure: ANTERIOR CERVICAL DECOMPRESSION/DISCECTOMY FUSION TWO LEVELS Cervical five-six, Cervical six-seven;  Surgeon: Phylliss Bob, MD;  Location: [facility];  Service: Orthopedics;  Laterality: N/A;  . CHOLECYSTECTOMY    . ESOPHAGEAL DILATION N/A 03/24/2017   Procedure: ESOPHAGEAL DILATION;  Surgeon: Rogene Houston, MD;  [facility];  Service: Endoscopy;  Laterality: N/A;  . ESOPHAGOGASTRODUODENOSCOPY N/A 03/24/2017   Procedure: ESOPHAGOGASTRODUODENOSCOPY (EGD);  Surgeon: Rogene Houston, MD;  [facility];  Service: Endoscopy;  Laterality: N/A;  1155  . INGUINAL HERNIA REPAIR Bilateral 05/29/2016   Procedure: OPEN HERNIA  REPAIR INGUINAL ADULT BILATERAL WITH insertion of MESH;  Surgeon: Fanny Skates, MD;  Location: [facility];  Service: General;  Laterality: Bilateral;  . Lipoma removed      There were no vitals filed for this visit.  Subjective Assessment - 07/14/17 0954    Subjective  Pt states he is still sore/hurting.  7/10 neck pain and 7/10 Lt UE, 8/10 Lt toes.  STates Baclofen is helping with his spasms as well as Valium.  States steroids are not helping with his pain but making him feel stronger.     Pain Score  7     Pain Location  Neck    Pain Orientation  Left    Pain Type  Acute pain    Pain Radiating Towards  into Lt shoulder                       [facility] Adult PT Treatment/Exercise - 07/14/17 0001      Lumbar Exercises: Standing   Heel Raises  20 reps    Heel Raises Limitations  heel and toe on foam    Forward Lunge  20 reps    Forward Lunge Limitations  on ground with step with 1 UE assist each lower extremity    Side Lunge  20 reps    Side Lunge Limitations  on ground with 1 UE assist each lower extremity with  demonstration, verbal and tactile cues      Knee/Hip Exercises: Standing   Lateral Step Up  Both;15 reps;Hand Hold: 1;Step Height: 4"    Forward Step Up  Both;15 reps;Hand Hold: 1;Step Height: 4"    Gait Training  x1 lap around gym with [facility] focusing on heel strike and L arm swing      Manual Therapy   Manual Therapy  Passive ROM    Manual therapy comments  completed separate rest of treatment    Passive ROM  cervical PROM all directions, L GH flexion, abd, IR, ER PROM x15 reps each               PT Short Term Goals - 07/09/17 1137      PT SHORT TERM GOAL #1   Title  Pt to be able to ambulate 226 feet in 3 minute time with least assistive device    Baseline  4/12: 28f SBQC, 2 standing rest breaks    Time  3    Period  Weeks    Status  On-going      PT SHORT TERM GOAL #2   Title  Pt to be able to single leg sance for 10" bilaterally to decrease  risk of falls     Baseline  4/12: RLE 30 sec, LLE 3 sec, very unsteady on LLE    Time  3    Period  Weeks    Status  Partially Met      PT SHORT TERM GOAL #3   Title  Pt back and LE mobility to increase to be able to don socks and shoes without difficulty.     Baseline  4/12: still difficult    Time  3    Period  Weeks    Status  On-going      PT SHORT TERM GOAL #4   Title  PT LE strength to have increased B to be able to come sit to stand with one UE assist without difficulty.     Baseline  4/12: able to perform STS with RUE support    Time  3    Period  Weeks    Status  Achieved      PT SHORT TERM GOAL #5   Title  Pt to feel confident walkng with a cane inside     Baseline  4/12: feels confident with cane inside    Time  3    Period  Weeks    Status  Achieved      PT SHORT TERM GOAL #6   Title  When okay by surgeon pt to be completing a HEP for the cervical spine to promote functional ROM     Baseline  4/12: per most recent MD order, "Passive for neck and UE's until 07/24/17 then progress as tolerated without restrictions"    Time  3    Period  Weeks    Status  On-going        PT Long Term Goals - 07/09/17 1138      PT LONG TERM GOAL #1   Title  PT to be able to ambulate 400 feet in three minutes with least assistive device     Baseline  4/12: 2058fSBQC, 2 standing rest breaks    Time  6    Period  Weeks    Status  On-going      PT LONG TERM GOAL #2   Title  PT to be able to single leg  stance for 20" to allow pt to feel comfortable walking in his yard.     Baseline  /12: RLE 30 sec, LLE 3 sec, very unsteady on LLE    Time  6    Period  Weeks    Status  Partially Met      PT LONG TERM GOAL #3   Title  PT LE and core strength to have increased to allow pt to be able to ascend and descend a flight of steps in a reciprocal manner     Baseline  4/12: step-to with BUE support    Time  6    Period  Weeks    Status  On-going      PT LONG TERM GOAL #4   Title  Pt  to feel confident walking in the community without an assistive device     Baseline  4/12: still utilizing SBQC    Time  6    Period  Weeks    Status  On-going      PT LONG TERM GOAL #5   Title  PT cervical ROM to be improved to allow pt to be able to look to his blind spot to feel confident in driving     Baseline  4/12: MD cleared pt to drive but states that he is scared to turn his neck     Time  6    Period  Weeks    Status  On-going            Plan - 07/14/17 1204    Clinical Impression Statement  Began session with ambulation, progressed to LE strengthening/stability and ended with cervical/Lt shoulder PROM.  Pt with improved lt UE swing this session regquiring less cues.  Progressed lunge to floor and began lateral and forward step ups with 1 UE assist.  Pt reported challenge with task.  PROM completed to cervical in supine with some discomfort noted with minimal motion.  Pt observed with more AROM when socializing in gym than therapist able to achieve with PROM.  Lt shoulder PROM much improved with approx 130 degrees abduction, 150 degrees flexion and full IR/ER as visualized by therapist but not exact measurements.     Rehab Potential  Good    PT Frequency  3x / week    PT Duration  6 weeks    PT Treatment/Interventions  ADLs/Self Care Home Management;DME Instruction;Stair training;Functional mobility training;Gait training;Therapeutic activities;Therapeutic exercise;Balance training;Neuromuscular re-education;Patient/family education;Manual techniques;Passive range of motion    PT Next Visit Plan  continue gentle passive cervical and BUE (L>R) ROM until 07/23/17; continue with LE strength and stability    Consulted and Agree with Plan of Care  Patient       Patient will benefit from skilled therapeutic intervention in order to improve the following deficits and impairments:  Abnormal gait, Decreased activity tolerance, Decreased balance, Decreased coordination, Decreased  knowledge of use of DME, Decreased mobility, Decreased strength, Difficulty walking, Impaired perceived functional ability, Pain  Visit Diagnosis: Muscle weakness (generalized)  History of falling  Difficulty in walking, not elsewhere classified  Other abnormalities of gait and mobility     Problem List Patient Active Problem List   Diagnosis Date Noted  . Spinal cord compression ([place])   . Surgery, elective   . Cervical myelopathy ([place])   . Syncope, vasovagal   . Weakness   . Generalized weakness 05/25/2017  . Left sided numbness 05/24/2017  . Right inguinal pain 04/20/2017  . Dysphagia 03/24/2017  .  Bilateral inguinal hernia 05/29/2016  . 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 Hyperglycemia 05/18/2013  . Radicular pain of left lower extremity   . Dizziness 05/16/2013   Teena Irani, PTA/CLT [phone]  Teena Irani 07/14/2017, 12:08 PM  [place] [address] [place], [name], [address] Phone: [phone]   Fax:  [phone]  Name: Zachary Nash MRN: [id number] Date of Birth: 1970-05-29

## 2017-07-16 ENCOUNTER — Encounter (HOSPITAL_COMMUNITY): Payer: Self-pay

## 2017-07-16 ENCOUNTER — Ambulatory Visit (HOSPITAL_COMMUNITY): Payer: BLUE CROSS/BLUE SHIELD

## 2017-07-16 DIAGNOSIS — M6281 Muscle weakness (generalized): Secondary | ICD-10-CM | POA: Diagnosis not present

## 2017-07-16 DIAGNOSIS — R262 Difficulty in walking, not elsewhere classified: Secondary | ICD-10-CM | POA: Diagnosis not present

## 2017-07-16 DIAGNOSIS — Z9181 History of falling: Secondary | ICD-10-CM | POA: Diagnosis not present

## 2017-07-16 DIAGNOSIS — R2689 Other abnormalities of gait and mobility: Secondary | ICD-10-CM

## 2017-07-16 NOTE — Therapy (Signed)
[place], [name], [address] Phone: [phone]   Fax:  [phone]  Physical Therapy Treatment  Patient Details  Name: Zachary Nash MRN: [id number] Date of Birth: 1971-02-24 Referring Provider: Allyn Kenner surgeon Phylliss Bob   Encounter Date: 07/16/2017  PT End of Session - 07/16/17 0859    Visit Number  12    Number of Visits  16    Date for PT Re-Evaluation  08/02/17    Authorization - Visit Number  50 had HHPT    Authorization - Number of Visits  30    PT Start Time  0900    PT Stop Time  0939    PT Time Calculation (min)  39 min    Activity Tolerance  Patient tolerated treatment well    Behavior During Therapy  [facility] for tasks assessed/performed       Past Medical History:  Diagnosis Date  . Anxiety   . Asthma   . Bilateral inguinal hernia 05/29/2016  . Complication of anesthesia    states he stopped breathing one time with anesthesia  . Constipation   . Fatty liver   . GERD (gastroesophageal reflux disease)   . History of kidney stones   . Pneumonia   . PONV (postoperative nausea and vomiting)   . Radicular pain of left lower extremity     Past Surgical History:  Procedure Laterality Date  . ANTERIOR CERVICAL DECOMP/DISCECTOMY FUSION N/A 05/26/2017   Procedure: ANTERIOR CERVICAL DECOMPRESSION/DISCECTOMY FUSION TWO LEVELS Cervical five-six, Cervical six-seven;  Surgeon: Phylliss Bob, MD;  Location: [facility];  Service: Orthopedics;  Laterality: N/A;  . CHOLECYSTECTOMY    . ESOPHAGEAL DILATION N/A 03/24/2017   Procedure: ESOPHAGEAL DILATION;  Surgeon: Rogene Houston, MD;  [facility];  Service: Endoscopy;  Laterality: N/A;  . ESOPHAGOGASTRODUODENOSCOPY N/A 03/24/2017   Procedure: ESOPHAGOGASTRODUODENOSCOPY (EGD);  Surgeon: Rogene Houston, MD;  [facility];  Service: Endoscopy;  Laterality: N/A;  1155  . INGUINAL HERNIA REPAIR Bilateral 05/29/2016   Procedure: OPEN HERNIA  REPAIR INGUINAL ADULT BILATERAL WITH insertion of MESH;  Surgeon: Fanny Skates, MD;  Location: [facility];  Service: General;  Laterality: Bilateral;  . Lipoma removed      There were no vitals filed for this visit.  Subjective Assessment - 07/16/17 0900    Subjective  Pt states that he had a rough day yesterday and he thinks it was due to the PROM last session. He states it was very painful.    Currently in Pain?  Yes    Pain Score  9     Pain Location  Neck    Pain Orientation  Left    Pain Descriptors / Indicators  Stabbing    Pain Type  Acute pain    Pain Radiating Towards  into L shoulder    Pain Onset  More than a month ago    Pain Frequency  Intermittent    Aggravating Factors   nothing    Pain Relieving Factors  nothing    Effect of Pain on Daily Activities  limits           [facility] Adult PT Treatment/Exercise - 07/16/17 0001      Knee/Hip Exercises: Standing   Forward Lunges  Both;15 reps;Limitations    Forward Lunges Limitations  BOSU    Lateral Step Up  Both;15 reps;Hand Hold: 1;Step Height: 4"    Forward Step Up  Both;15 reps;Hand Hold:  1;Step Height: 4"    Gait Training  x3 laps around gym with [facility] focusing on heel strike and L arm swing; x2 laps with PVC pipe in L hand to facilitate arm swing      Manual Therapy   Manual Therapy  Passive ROM    Manual therapy comments  completed separate rest of treatment    Passive ROM  cervical PROM all directions, L GH flexion, abd, IR, ER PROM x15 reps each             PT Education - 07/16/17 0859    Education provided  Yes    Education Details  arm swing in gait, exercise technique, relaxation during PROM; natural upd/downs in progression    Person(s) Educated  Patient    Methods  Explanation;Demonstration    Comprehension  Verbalized understanding;Returned demonstration       PT Short Term Goals - 07/09/17 1137      PT SHORT TERM GOAL #1   Title  Pt to be able to ambulate 226 feet in 3 minute time with least  assistive device    Baseline  4/12: 248f SBQC, 2 standing rest breaks    Time  3    Period  Weeks    Status  On-going      PT SHORT TERM GOAL #2   Title  Pt to be able to single leg sance for 10" bilaterally to decrease risk of falls     Baseline  4/12: RLE 30 sec, LLE 3 sec, very unsteady on LLE    Time  3    Period  Weeks    Status  Partially Met      PT SHORT TERM GOAL #3   Title  Pt back and LE mobility to increase to be able to don socks and shoes without difficulty.     Baseline  4/12: still difficult    Time  3    Period  Weeks    Status  On-going      PT SHORT TERM GOAL #4   Title  PT LE strength to have increased B to be able to come sit to stand with one UE assist without difficulty.     Baseline  4/12: able to perform STS with RUE support    Time  3    Period  Weeks    Status  Achieved      PT SHORT TERM GOAL #5   Title  Pt to feel confident walkng with a cane inside     Baseline  4/12: feels confident with cane inside    Time  3    Period  Weeks    Status  Achieved      PT SHORT TERM GOAL #6   Title  When okay by surgeon pt to be completing a HEP for the cervical spine to promote functional ROM     Baseline  4/12: per most recent MD order, "Passive for neck and UE's until 07/24/17 then progress as tolerated without restrictions"    Time  3    Period  Weeks    Status  On-going        PT Long Term Goals - 07/09/17 1138      PT LONG TERM GOAL #1   Title  PT to be able to ambulate 400 feet in three minutes with least assistive device     Baseline  4/12: 2065fSBQC, 2 standing rest breaks    Time  6  Period  Weeks    Status  On-going      PT LONG TERM GOAL #2   Title  PT to be able to single leg stance for 20" to allow pt to feel comfortable walking in his yard.     Baseline  /12: RLE 30 sec, LLE 3 sec, very unsteady on LLE    Time  6    Period  Weeks    Status  Partially Met      PT LONG TERM GOAL #3   Title  PT LE and core strength to have  increased to allow pt to be able to ascend and descend a flight of steps in a reciprocal manner     Baseline  4/12: step-to with BUE support    Time  6    Period  Weeks    Status  On-going      PT LONG TERM GOAL #4   Title  Pt to feel confident walking in the community without an assistive device     Baseline  4/12: still utilizing SBQC    Time  6    Period  Weeks    Status  On-going      PT LONG TERM GOAL #5   Title  PT cervical ROM to be improved to allow pt to be able to look to his blind spot to feel confident in driving     Baseline  4/12: MD cleared pt to drive but states that he is scared to turn his neck     Time  6    Period  Weeks    Status  On-going            Plan - 07/16/17 0940    Clinical Impression Statement  Pt presents to therapy with acute increase in neck and L sided pain following last session. He states that he was feeling good during the PROM last session which is why it was able to be increased but then later that day and all day yesterday he was in significant amount of pain. PROM performed within pt's pain tolerance this date, which was significantly less than last session. Rest of session focused on gait, BLE strength and balance. He continues to demo decreased L arm swing during gait so PT utilized PVC pipe to facilitate proper arm swing; min carry-over demonstrated afterwards. PT providing lots of education during session regarding ups/downs of progression and that this minor setback is normal; PT also explained that it could be any number of factors playing a role in his increase in pain, not just the progressed PROM and he verbalized understanding.. Continue as planned, progressing as able.     Rehab Potential  Good    PT Frequency  3x / week    PT Duration  6 weeks    PT Treatment/Interventions  ADLs/Self Care Home Management;DME Instruction;Stair training;Functional mobility training;Gait training;Therapeutic activities;Therapeutic exercise;Balance  training;Neuromuscular re-education;Patient/family education;Manual techniques;Passive range of motion    PT Next Visit Plan  continue gentle passive cervical and BUE (L>R) ROM until 07/23/17; continue with LE strength and stability; add standing on foam for balance/proprioception and decrease BOS as able    Consulted and Agree with Plan of Care  Patient       Patient will benefit from skilled therapeutic intervention in order to improve the following deficits and impairments:  Abnormal gait, Decreased activity tolerance, Decreased balance, Decreased coordination, Decreased knowledge of use of DME, Decreased mobility, Decreased strength, Difficulty walking, Impaired perceived  functional ability, Pain  Visit Diagnosis: Muscle weakness (generalized)  History of falling  Difficulty in walking, not elsewhere classified  Other abnormalities of gait and mobility     Problem List Patient Active Problem List   Diagnosis Date Noted  . Spinal cord compression ([place])   . Surgery, elective   . Cervical myelopathy ([place])   . Syncope, vasovagal   . Weakness   . Generalized weakness 05/25/2017  . Left sided numbness 05/24/2017  . Right inguinal pain 04/20/2017  . Dysphagia 03/24/2017  . Bilateral inguinal hernia 05/29/2016  . 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 Hyperglycemia 05/18/2013  . Radicular pain of left lower extremity   . Dizziness 05/16/2013        Zachary Nash PT, DPT  [facility] [address] [place], [name], [address] Phone: [phone]   Fax:  [phone]  Name: Zachary Nash MRN: [id number] Date of Birth: 12/19/1970

## 2017-07-19 ENCOUNTER — Ambulatory Visit (HOSPITAL_COMMUNITY): Payer: BLUE CROSS/BLUE SHIELD | Admitting: Physical Therapy

## 2017-07-19 ENCOUNTER — Encounter (HOSPITAL_COMMUNITY): Payer: Self-pay | Admitting: Physical Therapy

## 2017-07-19 DIAGNOSIS — R2689 Other abnormalities of gait and mobility: Secondary | ICD-10-CM

## 2017-07-19 DIAGNOSIS — R262 Difficulty in walking, not elsewhere classified: Secondary | ICD-10-CM | POA: Diagnosis not present

## 2017-07-19 DIAGNOSIS — Z9181 History of falling: Secondary | ICD-10-CM

## 2017-07-19 DIAGNOSIS — M6281 Muscle weakness (generalized): Secondary | ICD-10-CM

## 2017-07-19 NOTE — Therapy (Signed)
[place], [name], [address] Phone: [phone]   Fax:  [phone]  Physical Therapy Treatment  Patient Details  Name: Zachary Nash MRN: [id number] Date of Birth: 1971-01-10 Referring Provider: Allyn Kenner surgeon Phylliss Bob   Encounter Date: 07/19/2017  PT End of Session - 07/19/17 0831    Visit Number  13    Number of Visits  16    Date for PT Re-Evaluation  08/02/17    Authorization - Visit Number  54 had HHPT    Authorization - Number of Visits  30    PT Start Time  0815    PT Stop Time  2094    PT Time Calculation (min)  40 min    Activity Tolerance  Patient tolerated treatment well    Behavior During Therapy  [facility] for tasks assessed/performed       Past Medical History:  Diagnosis Date  . Anxiety   . Asthma   . Bilateral inguinal hernia 05/29/2016  . Complication of anesthesia    states he stopped breathing one time with anesthesia  . Constipation   . Fatty liver   . GERD (gastroesophageal reflux disease)   . History of kidney stones   . Pneumonia   . PONV (postoperative nausea and vomiting)   . Radicular pain of left lower extremity     Past Surgical History:  Procedure Laterality Date  . ANTERIOR CERVICAL DECOMP/DISCECTOMY FUSION N/A 05/26/2017   Procedure: ANTERIOR CERVICAL DECOMPRESSION/DISCECTOMY FUSION TWO LEVELS Cervical five-six, Cervical six-seven;  Surgeon: Phylliss Bob, MD;  Location: [facility];  Service: Orthopedics;  Laterality: N/A;  . CHOLECYSTECTOMY    . ESOPHAGEAL DILATION N/A 03/24/2017   Procedure: ESOPHAGEAL DILATION;  Surgeon: Rogene Houston, MD;  [facility];  Service: Endoscopy;  Laterality: N/A;  . ESOPHAGOGASTRODUODENOSCOPY N/A 03/24/2017   Procedure: ESOPHAGOGASTRODUODENOSCOPY (EGD);  Surgeon: Rogene Houston, MD;  [facility];  Service: Endoscopy;  Laterality: N/A;  1155  . INGUINAL HERNIA REPAIR Bilateral 05/29/2016   Procedure: OPEN HERNIA  REPAIR INGUINAL ADULT BILATERAL WITH insertion of MESH;  Surgeon: Fanny Skates, MD;  Location: [facility];  Service: General;  Laterality: Bilateral;  . Lipoma removed      There were no vitals filed for this visit.  Subjective Assessment - 07/19/17 0816    Subjective  Zachary Nash states that he is doing well.  He sang in his church for the first time yesterday.   He is wearing a camboot stating that they feel he may have nerve damage in his right foot. Pt is having pain in his arm and neck still    Patient Stated Goals  To get as much function as he can, walk without assistive device.  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     Currently in Pain?  Yes    Pain Score  7     Pain Location  Neck    Pain Orientation  Left    Pain Descriptors / Indicators  Aching    Pain Type  Chronic pain    Pain Onset  More than a month ago    Pain Frequency  Constant    Aggravating Factors   activity     Pain Relieving Factors  medication     Effect of Pain on Daily Activities  limits  [facility] Adult PT Treatment/Exercise - 07/19/17 0001      Exercises   Exercises  Neck;Knee/Hip      Neck Exercises: Supine   Other Supine Exercise  PROM to cervical and UE     Other Supine Exercise  Decompression exercises 1 2 4  and 5 await 3 until resictions are off on 4/27/      Lumbar Exercises: Standing   Forward Lunge  --    Other Standing Lumbar Exercises  single leg stance B x 5    Other Standing Lumbar Exercises  tandem stance on foam 30:" each       Lumbar Exercises: Seated   Sit to Stand  15 reps      Manual Therapy   Manual Therapy  Soft tissue mobilization;Passive ROM    Manual therapy comments  completed separate rest of treatment    Soft tissue mobilization  to shoulder area on Lt LE     Passive ROM  cervical and shoulder                PT Short Term Goals - 07/09/17 1137      PT SHORT TERM GOAL #1   Title  Pt to be able to  ambulate 226 feet in 3 minute time with least assistive device    Baseline  4/12: 231f SBQC, 2 standing rest breaks    Time  3    Period  Weeks    Status  On-going      PT SHORT TERM GOAL #2   Title  Pt to be able to single leg sance for 10" bilaterally to decrease risk of falls     Baseline  4/12: RLE 30 sec, LLE 3 sec, very unsteady on LLE    Time  3    Period  Weeks    Status  Partially Met      PT SHORT TERM GOAL #3   Title  Pt back and LE mobility to increase to be able to don socks and shoes without difficulty.     Baseline  4/12: still difficult    Time  3    Period  Weeks    Status  On-going      PT SHORT TERM GOAL #4   Title  PT LE strength to have increased B to be able to come sit to stand with one UE assist without difficulty.     Baseline  4/12: able to perform STS with RUE support    Time  3    Period  Weeks    Status  Achieved      PT SHORT TERM GOAL #5   Title  Pt to feel confident walkng with a cane inside     Baseline  4/12: feels confident with cane inside    Time  3    Period  Weeks    Status  Achieved      PT SHORT TERM GOAL #6   Title  When okay by surgeon pt to be completing a HEP for the cervical spine to promote functional ROM     Baseline  4/12: per most recent MD order, "Passive for neck and UE's until 07/24/17 then progress as tolerated without restrictions"    Time  3    Period  Weeks    Status  On-going        PT Long Term Goals - 07/09/17 1138      PT LONG TERM GOAL #1   Title  PT to be able to ambulate 400 feet in three minutes with least assistive device     Baseline  4/12: 268f SBQC, 2 standing rest breaks    Time  6    Period  Weeks    Status  On-going      PT LONG TERM GOAL #2   Title  PT to be able to single leg stance for 20" to allow pt to feel comfortable walking in his yard.     Baseline  /12: RLE 30 sec, LLE 3 sec, very unsteady on LLE    Time  6    Period  Weeks    Status  Partially Met      PT LONG TERM GOAL #3    Title  PT LE and core strength to have increased to allow pt to be able to ascend and descend a flight of steps in a reciprocal manner     Baseline  4/12: step-to with BUE support    Time  6    Period  Weeks    Status  On-going      PT LONG TERM GOAL #4   Title  Pt to feel confident walking in the community without an assistive device     Baseline  4/12: still utilizing SBQC    Time  6    Period  Weeks    Status  On-going      PT LONG TERM GOAL #5   Title  PT cervical ROM to be improved to allow pt to be able to look to his blind spot to feel confident in driving     Baseline  4/12: MD cleared pt to drive but states that he is scared to turn his neck     Time  6    Period  Weeks    Status  On-going            Plan - 07/19/17 0848    Clinical Impression Statement  Continued with PROM of B shoulder and cervical area with functional motions obtained.  Began decompression exercises but omitted #3 for now.  Pt continues to work on balance and strengthening.     History and Personal Factors relevant to plan of care:  PROM for shoulder and cervical until 4/27 per MD     Rehab Potential  Good    PT Frequency  3x / week    PT Duration  6 weeks    PT Treatment/Interventions  ADLs/Self Care Home Management;DME Instruction;Stair training;Functional mobility training;Gait training;Therapeutic activities;Therapeutic exercise;Balance training;Neuromuscular re-education;Patient/family education;Manual techniques;Passive range of motion    PT Next Visit Plan  begin walking without assistive device as well as tandem gait on foam.  Begin active cervical and shoulder motion begining 4/27    Consulted and Agree with Plan of Care  Patient       Patient will benefit from skilled therapeutic intervention in order to improve the following deficits and impairments:  Abnormal gait, Decreased activity tolerance, Decreased balance, Decreased coordination, Decreased knowledge of use of DME, Decreased  mobility, Decreased strength, Difficulty walking, Impaired perceived functional ability, Pain  Visit Diagnosis: Muscle weakness (generalized)  History of falling  Difficulty in walking, not elsewhere classified  Other abnormalities of gait and mobility     Problem List Patient Active Problem List   Diagnosis Date Noted  . [place] cord compression ([facility] [place]   . Surgery, elective   . Cervical myelopathy ([facility]   . Syncope, vasovagal   . Weakness   .  Generalized weakness 05/25/2017  . Left sided numbness 05/24/2017  . Right inguinal pain 04/20/2017  . Dysphagia 03/24/2017  . Bilateral inguinal hernia 05/29/2016  . 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 Hyperglycemia 05/18/2013  . Radicular pain of left lower extremity   . Dizziness 05/16/2013    Rayetta Humphrey, PT CLT [phone] 07/19/2017, 9:00 AM  [facility] [address] [place], [name], [address] Phone: [phone]   Fax:  [phone]  Name: Zachary Nash MRN: [id number] Date of Birth: 1970-06-10

## 2017-07-21 ENCOUNTER — Ambulatory Visit (HOSPITAL_COMMUNITY): Payer: BLUE CROSS/BLUE SHIELD

## 2017-07-21 ENCOUNTER — Encounter (HOSPITAL_COMMUNITY): Payer: Self-pay

## 2017-07-21 DIAGNOSIS — M6281 Muscle weakness (generalized): Secondary | ICD-10-CM | POA: Diagnosis not present

## 2017-07-21 DIAGNOSIS — R262 Difficulty in walking, not elsewhere classified: Secondary | ICD-10-CM

## 2017-07-21 DIAGNOSIS — R2689 Other abnormalities of gait and mobility: Secondary | ICD-10-CM | POA: Diagnosis not present

## 2017-07-21 DIAGNOSIS — Z9181 History of falling: Secondary | ICD-10-CM | POA: Diagnosis not present

## 2017-07-21 NOTE — Therapy (Signed)
[facility], [name], [address] Phone: [phone]   Fax:  [phone]  Physical Therapy Treatment  Patient Details  Name: Zachary Nash MRN: [id number] Date of Birth: 05-Jan-1971 Referring Provider: Allyn Kenner surgeon Phylliss Bob   Encounter Date: 07/21/2017  PT End of Session - 07/21/17 0814    Visit Number  14    Number of Visits  16    Date for PT Re-Evaluation  08/02/17    Authorization - Visit Number  1 had HHPT    Authorization - Number of Visits  30    PT Start Time  0815    Activity Tolerance  Patient tolerated treatment well    Behavior During Therapy  [facility] for tasks assessed/performed       Past Medical History:  Diagnosis Date  . Anxiety   . Asthma   . Bilateral inguinal hernia 05/29/2016  . Complication of anesthesia    states he stopped breathing one time with anesthesia  . Constipation   . Fatty liver   . GERD (gastroesophageal reflux disease)   . History of kidney stones   . Pneumonia   . PONV (postoperative nausea and vomiting)   . Radicular pain of left lower extremity     Past Surgical History:  Procedure Laterality Date  . ANTERIOR CERVICAL DECOMP/DISCECTOMY FUSION N/A 05/26/2017   Procedure: ANTERIOR CERVICAL DECOMPRESSION/DISCECTOMY FUSION TWO LEVELS Cervical five-six, Cervical six-seven;  Surgeon: Phylliss Bob, MD;  Location: [facility];  Service: Orthopedics;  Laterality: N/A;  . CHOLECYSTECTOMY    . ESOPHAGEAL DILATION N/A 03/24/2017   Procedure: ESOPHAGEAL DILATION;  Surgeon: Rogene Houston, MD;  [facility];  Service: Endoscopy;  Laterality: N/A;  . ESOPHAGOGASTRODUODENOSCOPY N/A 03/24/2017   Procedure: ESOPHAGOGASTRODUODENOSCOPY (EGD);  Surgeon: Rogene Houston, MD;  [facility];  Service: Endoscopy;  Laterality: N/A;  1155  . INGUINAL HERNIA REPAIR Bilateral 05/29/2016   Procedure: OPEN HERNIA REPAIR INGUINAL ADULT BILATERAL WITH insertion of MESH;   Surgeon: Fanny Skates, MD;  Location: [facility];  Service: General;  Laterality: Bilateral;  . Lipoma removed      There were no vitals filed for this visit.  Subjective Assessment - 07/21/17 0815    Subjective  Pt states that he is doing about the same today. He states last night he tried to move his neck and states he caught himself because it didn't feel good.     Patient Stated Goals  To get as much function as he can, walk without assistive device.  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     Currently in Pain?  Yes    Pain Score  7     Pain Location  Neck    Pain Orientation  Left    Pain Descriptors / Indicators  Aching    Pain Type  Chronic pain    Pain Onset  More than a month ago    Pain Frequency  Constant    Aggravating Factors   activity    Pain Relieving Factors  medication    Effect of Pain on Daily Activities  limits           [facility] Adult PT Treatment/Exercise - 07/21/17 0001      Neck Exercises: Supine   Other Supine Exercise  Decompression exercises 1 2 4  and 5 await 3 until resictions are off on 4/27/  Lumbar Exercises: Standing   Other Standing Lumbar Exercises  tandem gait on foam x3RT in // bars      Knee/Hip Exercises: Standing   Gait Training  x1 lap without AD (took pt 6:15 to complete 1 lap)      Manual Therapy   Manual Therapy  Passive ROM    Manual therapy comments  completed separate rest of treatment    Soft tissue mobilization  --    Passive ROM  cervical and shoulder           PT Education - 07/21/17 0814    Education provided  Yes    Education Details  relaxation during PROM, gait without AD, exercise technique, can trial amb in house without AD    Person(s) Educated  Patient    Methods  Explanation;Demonstration    Comprehension  Verbalized understanding;Returned demonstration       PT Short Term Goals - 07/09/17 1137      PT SHORT TERM GOAL #1   Title  Pt to be able to ambulate 226 feet in 3  minute time with least assistive device    Baseline  4/12: 233f SBQC, 2 standing rest breaks    Time  3    Period  Weeks    Status  On-going      PT SHORT TERM GOAL #2   Title  Pt to be able to single leg sance for 10" bilaterally to decrease risk of falls     Baseline  4/12: RLE 30 sec, LLE 3 sec, very unsteady on LLE    Time  3    Period  Weeks    Status  Partially Met      PT SHORT TERM GOAL #3   Title  Pt back and LE mobility to increase to be able to don socks and shoes without difficulty.     Baseline  4/12: still difficult    Time  3    Period  Weeks    Status  On-going      PT SHORT TERM GOAL #4   Title  PT LE strength to have increased B to be able to come sit to stand with one UE assist without difficulty.     Baseline  4/12: able to perform STS with RUE support    Time  3    Period  Weeks    Status  Achieved      PT SHORT TERM GOAL #5   Title  Pt to feel confident walkng with a cane inside     Baseline  4/12: feels confident with cane inside    Time  3    Period  Weeks    Status  Achieved      PT SHORT TERM GOAL #6   Title  When okay by surgeon pt to be completing a HEP for the cervical spine to promote functional ROM     Baseline  4/12: per most recent MD order, "Passive for neck and UE's until 07/24/17 then progress as tolerated without restrictions"    Time  3    Period  Weeks    Status  On-going        PT Long Term Goals - 07/09/17 1138      PT LONG TERM GOAL #1   Title  PT to be able to ambulate 400 feet in three minutes with least assistive device     Baseline  4/12: 2075fSBQC, 2 standing rest breaks  Time  6    Period  Weeks    Status  On-going      PT LONG TERM GOAL #2   Title  PT to be able to single leg stance for 20" to allow pt to feel comfortable walking in his yard.     Baseline  /12: RLE 30 sec, LLE 3 sec, very unsteady on LLE    Time  6    Period  Weeks    Status  Partially Met      PT LONG TERM GOAL #3   Title  PT LE and core  strength to have increased to allow pt to be able to ascend and descend a flight of steps in a reciprocal manner     Baseline  4/12: step-to with BUE support    Time  6    Period  Weeks    Status  On-going      PT LONG TERM GOAL #4   Title  Pt to feel confident walking in the community without an assistive device     Baseline  4/12: still utilizing SBQC    Time  6    Period  Weeks    Status  On-going      PT LONG TERM GOAL #5   Title  PT cervical ROM to be improved to allow pt to be able to look to his blind spot to feel confident in driving     Baseline  4/12: MD cleared pt to drive but states that he is scared to turn his neck     Time  6    Period  Weeks    Status  On-going            Plan - 07/21/17 0857    Clinical Impression Statement  Continued with established POC working on PROM of cervical and LUE and progressing gait without AD. Pt slightly unsteady without AD initially but was able to improve the longer he walked; he was noted to grab the wall during ambulation and it took him 6:15 to walk 1 lap around gym with 2 standing rest breaks and 1 seated rest break due to BLE fatigue. Continued with PROM to cervical spine and UE; pt with improved relaxation this date but still required cues to relax. PT able to increase his ROM throughout today. He also stated that during LUE GH PROM, it was not painful in his spine, only in his shoulder, which he had prior to his injury. Continued with decompression 1, 2, 4, 5 and added tandem gait on foam at EOS. Pt generally unsteady during tandem gait requiring intermittent UE support. Per new referral, pt can be progressed without restrictions starting on 07/24/17.    Rehab Potential  Good    PT Frequency  3x / week    PT Duration  6 weeks    PT Treatment/Interventions  ADLs/Self Care Home Management;DME Instruction;Stair training;Functional mobility training;Gait training;Therapeutic activities;Therapeutic exercise;Balance  training;Neuromuscular re-education;Patient/family education;Manual techniques;Passive range of motion    PT Next Visit Plan  continue walking without assistive device as well as tandem gait on foam. add sidestepping on foam; Begin active cervical and shoulder motion begining 4/27    Consulted and Agree with Plan of Care  Patient       Patient will benefit from skilled therapeutic intervention in order to improve the following deficits and impairments:  Abnormal gait, Decreased activity tolerance, Decreased balance, Decreased coordination, Decreased knowledge of use of DME, Decreased mobility, Decreased strength,  Difficulty walking, Impaired perceived functional ability, Pain  Visit Diagnosis: Muscle weakness (generalized)  History of falling  Difficulty in walking, not elsewhere classified  Other abnormalities of gait and mobility     Problem List Patient Active Problem List   Diagnosis Date Noted  . Spinal cord compression ([facility])   . Surgery, elective   . Cervical myelopathy ([facility])   . Syncope, vasovagal   . Weakness   . Generalized weakness 05/25/2017  . Left sided numbness 05/24/2017  . Right inguinal pain 04/20/2017  . Dysphagia 03/24/2017  . Bilateral inguinal hernia 05/29/2016  . 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 Hyperglycemia 05/18/2013  . Radicular pain of left lower extremity   . Dizziness 05/16/2013       Geraldine Solar PT, DPT  [facility] [address] [place], [name], [address] Phone: [phone]   Fax:  [phone]  Name: Zachary Nash MRN: [id number] Date of Birth: 10/03/1970

## 2017-07-23 ENCOUNTER — Encounter (HOSPITAL_COMMUNITY): Payer: Self-pay | Admitting: Physical Therapy

## 2017-07-23 ENCOUNTER — Ambulatory Visit (HOSPITAL_COMMUNITY): Payer: BLUE CROSS/BLUE SHIELD | Admitting: Physical Therapy

## 2017-07-23 DIAGNOSIS — M6281 Muscle weakness (generalized): Secondary | ICD-10-CM

## 2017-07-23 DIAGNOSIS — Z9181 History of falling: Secondary | ICD-10-CM | POA: Diagnosis not present

## 2017-07-23 DIAGNOSIS — R262 Difficulty in walking, not elsewhere classified: Secondary | ICD-10-CM

## 2017-07-23 DIAGNOSIS — R2689 Other abnormalities of gait and mobility: Secondary | ICD-10-CM | POA: Diagnosis not present

## 2017-07-23 NOTE — Therapy (Signed)
[place], [name], [address] Phone: [phone]   Fax:  [phone]  Physical Therapy Treatment  Patient Details  Name: Zachary Nash MRN: [id number] Date of Birth: Nov 20, 1970 Referring Provider: Allyn Kenner surgeon Phylliss Bob   Encounter Date: 07/23/2017  PT End of Session - 07/23/17 0955    Visit Number  15    Number of Visits  16    Date for PT Re-Evaluation  08/02/17    Authorization - Visit Number  68 had HHPT    Authorization - Number of Visits  30    PT Start Time  [id number] Patient arrived late    PT Stop Time  0905    PT Time Calculation (min)  32 min    Equipment Utilized During Treatment  Gait belt;Other (comment) Left foot boot    Activity Tolerance  Patient tolerated treatment well    Behavior During Therapy  WFL for tasks assessed/performed       Past Medical History:  Diagnosis Date  . Anxiety   . Asthma   . Bilateral inguinal hernia 05/29/2016  . Complication of anesthesia    states he stopped breathing one time with anesthesia  . Constipation   . Fatty liver   . GERD (gastroesophageal reflux disease)   . History of kidney stones   . Pneumonia   . PONV (postoperative nausea and vomiting)   . Radicular pain of left lower extremity     Past Surgical History:  Procedure Laterality Date  . ANTERIOR CERVICAL DECOMP/DISCECTOMY FUSION N/A 05/26/2017   Procedure: ANTERIOR CERVICAL DECOMPRESSION/DISCECTOMY FUSION TWO LEVELS Cervical five-six, Cervical six-seven;  Surgeon: Phylliss Bob, MD;  Location: [facility];  Service: Orthopedics;  Laterality: N/A;  . CHOLECYSTECTOMY    . ESOPHAGEAL DILATION N/A 03/24/2017   Procedure: ESOPHAGEAL DILATION;  Surgeon: Rogene Houston, MD;  [facility];  Service: Endoscopy;  Laterality: N/A;  . ESOPHAGOGASTRODUODENOSCOPY N/A 03/24/2017   Procedure: ESOPHAGOGASTRODUODENOSCOPY (EGD);  Surgeon: Rogene Houston, MD;  [facility];  Service:  Endoscopy;  Laterality: N/A;  1155  . INGUINAL HERNIA REPAIR Bilateral 05/29/2016   Procedure: OPEN HERNIA REPAIR INGUINAL ADULT BILATERAL WITH insertion of MESH;  Surgeon: Fanny Skates, MD;  Location: [facility];  Service: General;  Laterality: Bilateral;  . Lipoma removed      There were no vitals filed for this visit.  Subjective Assessment - 07/23/17 0844    Subjective  Pt states that he is doing about the same today. He states last night he tried to move his neck and states he caught himself because it didn't feel good. Patient stated that he had a near fall in the bathroom today.      Patient Stated Goals  To get as much function as he can, walk without assistive device.  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     Currently in Pain?  Yes    Pain Score  6     Pain Location  Neck    Pain Orientation  Left    Pain Descriptors / Indicators  Aching    Pain Type  Chronic pain    Pain Onset  More than a month ago    Pain Frequency  Constant    Aggravating Factors   Activity    Pain Relieving Factors  Medication    Effect of Pain on Daily Activities  Limits  Multiple Pain Sites  Yes    Pain Score  6    Pain Location  Leg    Pain Orientation  Left    Pain Descriptors / Indicators  Aching;Throbbing;Spasm    Pain Type  Chronic pain    Pain Onset  More than a month ago    Pain Frequency  Constant    Aggravating Factors   Sitting    Pain Relieving Factors  Medication    Effect of Pain on Daily Activities  Limits                       [facility] Adult PT Treatment/Exercise - 07/23/17 0001      Lumbar Exercises: Standing   Other Standing Lumbar Exercises  tandem gait on foam 9 feet x3RT in // bars. Sidestepping on foam 9 feet x3RT in // bars. Intermittent upper extremity assistance.      Knee/Hip Exercises: Standing   Gait Training  226 feet or 1 lap without AD (Took 5:10 minutes to complete) 3 standing rest breaks throughout, VCs step through  pattern      Knee/Hip Exercises: Supine   Other Supine Knee/Hip Exercises  Decompression exercises: Shoulder press; Leg lengthener each lower extremity; leg press into mat each lower extremity 5x 3 second holds; Leg lengthener 5 x 3 second holds for all               PT Short Term Goals - 07/09/17 1137      PT SHORT TERM GOAL #1   Title  Pt to be able to ambulate 226 feet in 3 minute time with least assistive device    Baseline  4/12: 245f SBQC, 2 standing rest breaks    Time  3    Period  Weeks    Status  On-going      PT SHORT TERM GOAL #2   Title  Pt to be able to single leg sance for 10" bilaterally to decrease risk of falls     Baseline  4/12: RLE 30 sec, LLE 3 sec, very unsteady on LLE    Time  3    Period  Weeks    Status  Partially Met      PT SHORT TERM GOAL #3   Title  Pt back and LE mobility to increase to be able to don socks and shoes without difficulty.     Baseline  4/12: still difficult    Time  3    Period  Weeks    Status  On-going      PT SHORT TERM GOAL #4   Title  PT LE strength to have increased B to be able to come sit to stand with one UE assist without difficulty.     Baseline  4/12: able to perform STS with RUE support    Time  3    Period  Weeks    Status  Achieved      PT SHORT TERM GOAL #5   Title  Pt to feel confident walkng with a cane inside     Baseline  4/12: feels confident with cane inside    Time  3    Period  Weeks    Status  Achieved      PT SHORT TERM GOAL #6   Title  When okay by surgeon pt to be completing a HEP for the cervical spine to promote functional ROM     Baseline  4/12: per most  recent MD order, "Passive for neck and UE's until 07/24/17 then progress as tolerated without restrictions"    Time  3    Period  Weeks    Status  On-going        PT Long Term Goals - 07/09/17 1138      PT LONG TERM GOAL #1   Title  PT to be able to ambulate 400 feet in three minutes with least assistive device     Baseline   4/12: 243f SBQC, 2 standing rest breaks    Time  6    Period  Weeks    Status  On-going      PT LONG TERM GOAL #2   Title  PT to be able to single leg stance for 20" to allow pt to feel comfortable walking in his yard.     Baseline  /12: RLE 30 sec, LLE 3 sec, very unsteady on LLE    Time  6    Period  Weeks    Status  Partially Met      PT LONG TERM GOAL #3   Title  PT LE and core strength to have increased to allow pt to be able to ascend and descend a flight of steps in a reciprocal manner     Baseline  4/12: step-to with BUE support    Time  6    Period  Weeks    Status  On-going      PT LONG TERM GOAL #4   Title  Pt to feel confident walking in the community without an assistive device     Baseline  4/12: still utilizing SBQC    Time  6    Period  Weeks    Status  On-going      PT LONG TERM GOAL #5   Title  PT cervical ROM to be improved to allow pt to be able to look to his blind spot to feel confident in driving     Baseline  4/12: MD cleared pt to drive but states that he is scared to turn his neck     Time  6    Period  Weeks    Status  On-going            Plan - 07/23/17 0936    Clinical Impression Statement  This session patient arrived late and session was therefore shortened. This session focused on continuing to progress patient with gait training and ambulation without assistive device. Therapist provided verbal cues to perform ambulation with step-through gait pattern. Patient performed lap this session in less time than previous session. This session patient performed tandem ambulation which patient required upper extremity assistance intermittently for. This session added sidestepping on foam which patient tolerated well and required intermittent upper extremity support this session. Patient would benefit from continued skilled physical therapy in order to continue progress with gait training, balance, strengthening, and overall functional mobility.       Rehab Potential  Good    PT Frequency  3x / week    PT Duration  6 weeks    PT Treatment/Interventions  ADLs/Self Care Home Management;DME Instruction;Stair training;Functional mobility training;Gait training;Therapeutic activities;Therapeutic exercise;Balance training;Neuromuscular re-education;Patient/family education;Manual techniques;Passive range of motion    PT Next Visit Plan  continue walking without assistive device as well as tandem gait on foam. Continue sidestepping on foam; Begin active cervical and shoulder motion begining 4/27    Consulted and Agree with Plan of Care  Patient  Patient will benefit from skilled therapeutic intervention in order to improve the following deficits and impairments:  Abnormal gait, Decreased activity tolerance, Decreased balance, Decreased coordination, Decreased knowledge of use of DME, Decreased mobility, Decreased strength, Difficulty walking, Impaired perceived functional ability, Pain  Visit Diagnosis: Muscle weakness (generalized)  History of falling  Difficulty in walking, not elsewhere classified  Other abnormalities of gait and mobility     Problem List Patient Active Problem List   Diagnosis Date Noted  . Spinal cord compression ([place] [facility])   . Surgery, elective   . Cervical myelopathy ([facility])   . Syncope, vasovagal   . Weakness   . Generalized weakness 05/25/2017  . Left sided numbness 05/24/2017  . Right inguinal pain 04/20/2017  . Dysphagia 03/24/2017  . Bilateral inguinal hernia 05/29/2016  . 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 Hyperglycemia 05/18/2013  . Radicular pain of left lower extremity   . Dizziness 05/16/2013   Clarene Critchley PT, DPT 10:03 AM, 07/23/17 [facility] [address] [place], [name], [address] Phone: [phone]   Fax:  [phone]  Name: Zachary Nash MRN: [id number] Date of Birth: 08/23/1970

## 2017-07-26 ENCOUNTER — Ambulatory Visit (HOSPITAL_COMMUNITY): Payer: BLUE CROSS/BLUE SHIELD | Admitting: Physical Therapy

## 2017-07-26 ENCOUNTER — Telehealth (HOSPITAL_COMMUNITY): Payer: Self-pay | Admitting: Physical Therapy

## 2017-07-26 NOTE — Telephone Encounter (Signed)
Patient have another appt scheduled in [name] and needed to cancel for today.

## 2017-07-27 ENCOUNTER — Encounter (INDEPENDENT_AMBULATORY_CARE_PROVIDER_SITE_OTHER): Payer: Self-pay | Admitting: Internal Medicine

## 2017-07-27 ENCOUNTER — Telehealth (HOSPITAL_COMMUNITY): Payer: Self-pay | Admitting: Internal Medicine

## 2017-07-27 ENCOUNTER — Ambulatory Visit (INDEPENDENT_AMBULATORY_CARE_PROVIDER_SITE_OTHER): Payer: Self-pay | Admitting: Internal Medicine

## 2017-07-27 ENCOUNTER — Ambulatory Visit (HOSPITAL_COMMUNITY): Payer: BLUE CROSS/BLUE SHIELD | Admitting: Physical Therapy

## 2017-07-27 VITALS — BP 120/80 | HR 72 | Temp 97.7°F | Ht 71.0 in | Wt 199.8 lb

## 2017-07-27 DIAGNOSIS — R2689 Other abnormalities of gait and mobility: Secondary | ICD-10-CM

## 2017-07-27 DIAGNOSIS — R1310 Dysphagia, unspecified: Secondary | ICD-10-CM

## 2017-07-27 DIAGNOSIS — R1319 Other dysphagia: Secondary | ICD-10-CM

## 2017-07-27 DIAGNOSIS — R262 Difficulty in walking, not elsewhere classified: Secondary | ICD-10-CM | POA: Diagnosis not present

## 2017-07-27 DIAGNOSIS — R29898 Other symptoms and signs involving the musculoskeletal system: Secondary | ICD-10-CM | POA: Insufficient documentation

## 2017-07-27 DIAGNOSIS — Z9181 History of falling: Secondary | ICD-10-CM

## 2017-07-27 DIAGNOSIS — R200 Anesthesia of skin: Secondary | ICD-10-CM | POA: Insufficient documentation

## 2017-07-27 DIAGNOSIS — K219 Gastro-esophageal reflux disease without esophagitis: Secondary | ICD-10-CM

## 2017-07-27 DIAGNOSIS — M6281 Muscle weakness (generalized): Secondary | ICD-10-CM

## 2017-07-27 NOTE — Progress Notes (Signed)
Subjective:    Patient ID: Zachary Nash, male    DOB: 10-Aug-1970, 47 y.o.   MRN: [id number]  HPI Here today for f/u. Underwent and EGD 03/24/2017 for dysphagia.  Normal proximal esophagus and mid esophagus. LA grade B reflux esophagitis. Benign appearing esophageal stenosis at [facility]. Dilated. He tells me he is doing good. He says he is not having any dysphagia. He can eat what he wants as long as he takes the Protonix.  GERD controlled with Protonix. If he misses a doses her has burning in his esophagus.   Neck surgery C2-C7 for an injury. (cord compression)   Review of Systems Past Medical History:  Diagnosis Date  . Anxiety   . Asthma   . Bilateral inguinal hernia 05/29/2016  . Complication of anesthesia    states he stopped breathing one time with anesthesia  . Constipation   . Fatty liver   . GERD (gastroesophageal reflux disease)   . History of kidney stones   . Pneumonia   . PONV (postoperative nausea and vomiting)   . Radicular pain of left lower extremity     Past Surgical History:  Procedure Laterality Date  . ANTERIOR CERVICAL DECOMP/DISCECTOMY FUSION N/A 05/26/2017   Procedure: ANTERIOR CERVICAL DECOMPRESSION/DISCECTOMY FUSION TWO LEVELS Cervical five-six, Cervical six-seven;  Surgeon: Phylliss Bob, MD;  Location: [facility];  Service: Orthopedics;  Laterality: N/A;  . CHOLECYSTECTOMY    . ESOPHAGEAL DILATION N/A 03/24/2017   Procedure: ESOPHAGEAL DILATION;  Surgeon: Rogene Houston, MD;  [facility];  Service: Endoscopy;  Laterality: N/A;  . ESOPHAGOGASTRODUODENOSCOPY N/A 03/24/2017   Procedure: ESOPHAGOGASTRODUODENOSCOPY (EGD);  Surgeon: Rogene Houston, MD;  [facility];  Service: Endoscopy;  Laterality: N/A;  1155  . INGUINAL HERNIA REPAIR Bilateral 05/29/2016   Procedure: OPEN HERNIA REPAIR INGUINAL ADULT BILATERAL WITH insertion of MESH;  Surgeon: Fanny Skates, MD;  Location: [facility];  Service: General;  Laterality: Bilateral;  . Lipoma  removed      Allergies  Allergen Reactions  . Penicillins Anaphylaxis and Swelling    Has patient had a PCN reaction causing immediate rash, facial/tongue/throat swelling, SOB or lightheadedness with hypotension: yes Has patient had a PCN reaction causing severe rash involving mucus membranes or skin necrosis: yes Has patient had a PCN reaction that required hospitalization: no Has patient had a PCN reaction occurring within the last 10 years: no If all of the above answers are "NO", then may proceed with Cephalosporin use.    Current Outpatient Medications on File Prior to Visit  Medication Sig Dispense Refill  . acetaminophen (TYLENOL) 325 MG tablet Take 2 tablets (650 mg total) by mouth every 6 (six) hours as needed for mild pain (or Fever >/= 101). 60 tablet 3  . albuterol (PROVENTIL HFA;VENTOLIN HFA) 108 (90 BASE) MCG/ACT inhaler Inhale 2 puffs into the lungs every 6 (six) hours as needed for wheezing.    . diazepam (VALIUM) 5 MG tablet Take 1 tablet (5 mg total) by mouth every 6 (six) hours as needed for muscle spasms. 30 tablet 0  . famotidine (PEPCID) 20 MG tablet Take 1 tablet (20 mg total) by mouth at bedtime.    . methocarbamol (ROBAXIN) 750 MG tablet Take 750 mg by mouth every morning.    . Multiple Vitamins-Minerals (ALIVE MENS ENERGY PO) Take 1 tablet by mouth daily.    . pantoprazole (PROTONIX) 40 MG tablet Take 1 tablet (40 mg total) by mouth daily before breakfast. 90 tablet  1  . senna-docusate (SENOKOT-S) 8.6-50 MG tablet Take 1 tablet by mouth at bedtime as needed for mild constipation. Over the counter 30 tablet 0  . UNABLE TO FIND Outpatient physical therapy, occupational therapy   Diagnosis: Cervical myeloradiculopathy 1 Mutually Defined 0   No current facility-administered medications on file prior to visit.         Objective:   Physical Exam  Blood pressure 120/80, pulse 72, temperature 97.7 F (36.5 C), height 5' 11"  (1.803 m), weight 199 lb 12.8 oz (90.6  kg). Alert and oriented. Skin warm and dry. Oral mucosa is moist.   . Sclera anicteric, conjunctivae is pink. Thyroid not enlarged. No cervical lymphadenopathy. Lungs clear. Heart regular rate and rhythm.  Abdomen is soft. Bowel sounds are positive. No hepatomegaly. No abdominal masses felt. No tenderness.  No edema to lower extremities.          Assessment & Plan:  Dysphagia. He is not having any dysphagia at this time.  GERD: He will continue the Protonix. OV in 1 year.

## 2017-07-27 NOTE — Telephone Encounter (Signed)
07/27/17  Left him a message offering either 9am or 10:30 on another schedule.  He is with Otho Ket at 9:45 but she is double booked.

## 2017-07-27 NOTE — Patient Instructions (Signed)
Continue the Protonix. OV in 1 year.

## 2017-07-27 NOTE — Therapy (Signed)
[place], [name], [address] Phone: [phone]   Fax:  [phone]  Physical Therapy Treatment  Patient Details  Name: Zachary Nash MRN: [id number] Date of Birth: June 21, 1970 Referring Provider: Allyn Kenner surgeon Phylliss Bob   Encounter Date: 07/27/2017  PT End of Session - 07/27/17 1033    Visit Number  16    Number of Visits  16    Date for PT Re-Evaluation  08/02/17    Authorization - Visit Number  26 had HHPT    Authorization - Number of Visits  30    PT Start Time  0905    PT Stop Time  0952    PT Time Calculation (min)  47 min    Equipment Utilized During Treatment  Gait belt;Other (comment) Left foot boot    Activity Tolerance  Patient tolerated treatment well    Behavior During Therapy  WFL for tasks assessed/performed       Past Medical History:  Diagnosis Date  . Anxiety   . Asthma   . Bilateral inguinal hernia 05/29/2016  . Complication of anesthesia    states he stopped breathing one time with anesthesia  . Constipation   . Fatty liver   . GERD (gastroesophageal reflux disease)   . History of kidney stones   . Pneumonia   . PONV (postoperative nausea and vomiting)   . Radicular pain of left lower extremity     Past Surgical History:  Procedure Laterality Date  . ANTERIOR CERVICAL DECOMP/DISCECTOMY FUSION N/A 05/26/2017   Procedure: ANTERIOR CERVICAL DECOMPRESSION/DISCECTOMY FUSION TWO LEVELS Cervical five-six, Cervical six-seven;  Surgeon: Phylliss Bob, MD;  Location: [facility];  Service: Orthopedics;  Laterality: N/A;  . CHOLECYSTECTOMY    . ESOPHAGEAL DILATION N/A 03/24/2017   Procedure: ESOPHAGEAL DILATION;  Surgeon: Rogene Houston, MD;  [facility];  Service: Endoscopy;  Laterality: N/A;  . ESOPHAGOGASTRODUODENOSCOPY N/A 03/24/2017   Procedure: ESOPHAGOGASTRODUODENOSCOPY (EGD);  Surgeon: Rogene Houston, MD;  [facility];  Service: Endoscopy;  Laterality:  N/A;  1155  . INGUINAL HERNIA REPAIR Bilateral 05/29/2016   Procedure: OPEN HERNIA REPAIR INGUINAL ADULT BILATERAL WITH insertion of MESH;  Surgeon: Fanny Skates, MD;  Location: [facility];  Service: General;  Laterality: Bilateral;  . Lipoma removed      There were no vitals filed for this visit.  Subjective Assessment - 07/27/17 0913    Subjective  PT states driving is the most difficult for him.  Currently 6/10 in cervical region.     Currently in Pain?  Yes    Pain Score  6     Pain Location  Neck    Pain Orientation  Left    Pain Descriptors / Indicators  Aching    Pain Type  Chronic pain                       [facility] Adult PT Treatment/Exercise - 07/27/17 0001      Neck Exercises: Standing   Upper Extremity Flexion with Stabilization  10 reps    Other Standing Exercises  UE slides into flexion 10 reps each      Neck Exercises: Seated   Other Seated Exercise  gentle cervical excursions in painfree ROM 5 reps each      Lumbar Exercises: Standing   Other Standing Lumbar Exercises  balance beam tandem & sidestepping 2Rt      Knee/Hip Exercises: Standing  Gait Training  226 feet or 1 lap without AD (Took 3:45 minutes to complete)               PT Short Term Goals - 07/09/17 1137      PT SHORT TERM GOAL #1   Title  Pt to be able to ambulate 226 feet in 3 minute time with least assistive device    Baseline  4/12: 224f SBQC, 2 standing rest breaks    Time  3    Period  Weeks    Status  On-going      PT SHORT TERM GOAL #2   Title  Pt to be able to single leg sance for 10" bilaterally to decrease risk of falls     Baseline  4/12: RLE 30 sec, LLE 3 sec, very unsteady on LLE    Time  3    Period  Weeks    Status  Partially Met      PT SHORT TERM GOAL #3   Title  Pt back and LE mobility to increase to be able to don socks and shoes without difficulty.     Baseline  4/12: still difficult    Time  3    Period  Weeks    Status  On-going      PT SHORT  TERM GOAL #4   Title  PT LE strength to have increased B to be able to come sit to stand with one UE assist without difficulty.     Baseline  4/12: able to perform STS with RUE support    Time  3    Period  Weeks    Status  Achieved      PT SHORT TERM GOAL #5   Title  Pt to feel confident walkng with a cane inside     Baseline  4/12: feels confident with cane inside    Time  3    Period  Weeks    Status  Achieved      PT SHORT TERM GOAL #6   Title  When okay by surgeon pt to be completing a HEP for the cervical spine to promote functional ROM     Baseline  4/12: per most recent MD order, "Passive for neck and UE's until 07/24/17 then progress as tolerated without restrictions"    Time  3    Period  Weeks    Status  On-going        PT Long Term Goals - 07/09/17 1138      PT LONG TERM GOAL #1   Title  PT to be able to ambulate 400 feet in three minutes with least assistive device     Baseline  4/12: 2067fSBQC, 2 standing rest breaks    Time  6    Period  Weeks    Status  On-going      PT LONG TERM GOAL #2   Title  PT to be able to single leg stance for 20" to allow pt to feel comfortable walking in his yard.     Baseline  /12: RLE 30 sec, LLE 3 sec, very unsteady on LLE    Time  6    Period  Weeks    Status  Partially Met      PT LONG TERM GOAL #3   Title  PT LE and core strength to have increased to allow pt to be able to ascend and descend a flight of steps in a reciprocal manner  Baseline  4/12: step-to with BUE support    Time  6    Period  Weeks    Status  On-going      PT LONG TERM GOAL #4   Title  Pt to feel confident walking in the community without an assistive device     Baseline  4/12: still utilizing SBQC    Time  6    Period  Weeks    Status  On-going      PT LONG TERM GOAL #5   Title  PT cervical ROM to be improved to allow pt to be able to look to his blind spot to feel confident in driving     Baseline  4/12: MD cleared pt to drive but states  that he is scared to turn his neck     Time  6    Period  Weeks    Status  On-going            Plan - 07/27/17 1034    Clinical Impression Statement  Noted good head rotation/mobiltiy with conversation today.  Comes today without AD and able to complete 1 lap in gym nearly 1:30" quicker than last visit and without standing rest breaks.  Progressed balance activities to balance beam in hallway with intermittent HHA, mainly when advancing Rt LE.  Added UE flexion against wall and began gentle cervical excursions without pain voiced or difficulty.  Pt without c/o pain at EOS    Rehab Potential  Good    PT Frequency  3x / week    PT Duration  6 weeks    PT Treatment/Interventions  ADLs/Self Care Home Management;DME Instruction;Stair training;Functional mobility training;Gait training;Therapeutic activities;Therapeutic exercise;Balance training;Neuromuscular re-education;Patient/family education;Manual techniques;Passive range of motion    PT Next Visit Plan  continue walking without assistive device as well as tandem gait on foam. Progress active cervical and shoulder motion ( begining 4/27 per MD).  Next visit begin postural strengthening, re-evaluate.     Consulted and Agree with Plan of Care  Patient       Patient will benefit from skilled therapeutic intervention in order to improve the following deficits and impairments:  Abnormal gait, Decreased activity tolerance, Decreased balance, Decreased coordination, Decreased knowledge of use of DME, Decreased mobility, Decreased strength, Difficulty walking, Impaired perceived functional ability, Pain  Visit Diagnosis: Muscle weakness (generalized)  History of falling  Difficulty in walking, not elsewhere classified  Other abnormalities of gait and mobility     Problem List Patient Active Problem List   Diagnosis Date Noted  . Spinal cord compression ([facility])   . Surgery, elective   . Cervical myelopathy ([facility])   . Syncope, vasovagal    . Weakness   . Generalized weakness 05/25/2017  . Left sided numbness 05/24/2017  . Right inguinal pain 04/20/2017  . Dysphagia 03/24/2017  . Bilateral inguinal hernia 05/29/2016  . 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 Hyperglycemia 05/18/2013  . Radicular pain of left lower extremity   . Dizziness 05/16/2013   Teena Irani, PTA/CLT [phone]  Teena Irani 07/27/2017, 10:37 AM  [facility] [address] [place], [name], [address] Phone: [phone]   Fax:  [phone]  Name: Zachary Nash MRN: [id number] Date of Birth: 12-15-1970

## 2017-07-28 ENCOUNTER — Ambulatory Visit (HOSPITAL_COMMUNITY): Payer: PRIVATE HEALTH INSURANCE | Attending: Internal Medicine

## 2017-07-28 ENCOUNTER — Other Ambulatory Visit: Payer: Self-pay

## 2017-07-28 ENCOUNTER — Encounter (HOSPITAL_COMMUNITY): Payer: Self-pay

## 2017-07-28 DIAGNOSIS — R2689 Other abnormalities of gait and mobility: Secondary | ICD-10-CM

## 2017-07-28 DIAGNOSIS — M6281 Muscle weakness (generalized): Secondary | ICD-10-CM | POA: Insufficient documentation

## 2017-07-28 DIAGNOSIS — R262 Difficulty in walking, not elsewhere classified: Secondary | ICD-10-CM | POA: Insufficient documentation

## 2017-07-28 DIAGNOSIS — Z9181 History of falling: Secondary | ICD-10-CM

## 2017-07-28 NOTE — Therapy (Signed)
Zachary Nash, [name], [address] Phone: [phone]   Fax:  [phone]  Physical Therapy Treatment  Patient Details  Name: Zachary Nash MRN: [id number] Date of Birth: [birth year] Referring Provider: Allyn Nash surgeon Zachary Nash   Encounter Date: 07/28/2017  PT End of Session - 07/28/17 1103    Visit Number  17    Number of Visits  18    Date for PT Re-Evaluation  08/02/17    Authorization Type  [employer]    Authorization Time Period  06/21/17-07/31/17    Authorization - Visit Number  21 had HHPT    Authorization - Number of Visits  30    PT Start Time  1034    PT Stop Time  1114    PT Time Calculation (min)  40 min    Equipment Utilized During Treatment  Gait belt;Other (comment) Left foot boot    Activity Tolerance  Patient tolerated treatment well    Behavior During Therapy  WFL for tasks assessed/performed       Past Medical History:  Diagnosis Date  . Anxiety   . Asthma   . Bilateral inguinal hernia 05/29/2016  . Complication of anesthesia    states he stopped breathing one time with anesthesia  . Constipation   . Fatty liver   . GERD (gastroesophageal reflux disease)   . History of kidney stones   . Pneumonia   . PONV (postoperative nausea and vomiting)   . Radicular pain of left lower extremity     Past Surgical History:  Procedure Laterality Date  . ANTERIOR CERVICAL DECOMP/DISCECTOMY FUSION N/A 05/26/2017   Procedure: ANTERIOR CERVICAL DECOMPRESSION/DISCECTOMY FUSION TWO LEVELS Cervical five-six, Cervical six-seven;  Surgeon: Zachary Bob, MD;  Location: [facility];  Service: Orthopedics;  Laterality: N/A;  . CHOLECYSTECTOMY    . ESOPHAGEAL DILATION N/A 03/24/2017   Procedure: ESOPHAGEAL DILATION;  Surgeon: Zachary Houston, MD;  [facility];  Service: Endoscopy;  Laterality: N/A;  . ESOPHAGOGASTRODUODENOSCOPY N/A 03/24/2017   Procedure: ESOPHAGOGASTRODUODENOSCOPY (EGD);   Surgeon: Zachary Houston, MD;  [facility];  Service: Endoscopy;  Laterality: N/A;  1155  . INGUINAL HERNIA REPAIR Bilateral 05/29/2016   Procedure: OPEN HERNIA REPAIR INGUINAL ADULT BILATERAL WITH insertion of MESH;  Surgeon: Zachary Skates, MD;  Location: [facility];  Service: General;  Laterality: Bilateral;  . Lipoma removed      There were no vitals filed for this visit.  Subjective Assessment - 07/28/17 1137    Subjective  Patient arrives reporting he is a little sore from yesterday. He reprots he knows he is doing much better than he was when he began therapy. He reports he is getting more comfortable walking without teh quad cane but still brought it today. He had a NCV test yesterday and c/o how uncomfortable the test was.    Pertinent History  Cervical fusion ACDF C5-7    Limitations  Lifting;Standing;Walking;House hold activities    Patient Stated Goals  To get as much function as he can, walk without assistive device.  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     Currently in Pain?  Yes    Pain Score  4     Pain Location  Neck    Pain Orientation  Left    Pain Descriptors / Indicators  Aching;Sore    Pain Type  Chronic pain  Pain Onset  More than a month ago    Pain Frequency  Constant        [facility] Adult PT Treatment/Exercise - 07/28/17 0001      Ambulation/Gait   Ambulation/Gait  Yes    Ambulation/Gait Assistance  6: Modified independent (Device/Increase time)    Ambulation Distance (Feet)  226 Feet 2 bouts    Assistive device  None    Gait Pattern  Step-through pattern;Decreased arm swing - right;Decreased arm swing - left;Decreased step length - left;Decreased stride length;Decreased hip/knee flexion - right;Decreased hip/knee flexion - left    Ambulation Surface  Level      Neck Exercises: Theraband   Shoulder Extension  15 reps;Red    Rows  15 reps;Red      Neck Exercises: Seated   Money  15 reps;3 secs;Limitations    Other  Seated Exercise  gentle cervical excursions( flex/ext, lateral flex, rotation) in painfree ROM 5 reps each       Balance Exercises - 07/28/17 1053      Balance Exercises: Standing   Tandem Stance  Eyes open;Foam/compliant surface;Intermittent upper extremity support;Other reps (comment);Limitations alt foot alignment; 15 reps with 2# overhead flexion    Sidestepping  Foam/compliant support;Limitations;2 reps RT ~ 20' on foam        PT Education - 07/28/17 1136    Education provided  Yes    Education Details  Educated on nex exercise form/technique, educated on updated HEP exercises.    Person(s) Educated  Patient    Methods  Explanation;Verbal cues;Handout;Demonstration    Comprehension  Verbalized understanding;Returned demonstration       PT Short Term Goals - 07/09/17 1137      PT SHORT TERM GOAL #1   Title  Pt to be able to ambulate 226 feet in 3 minute time with least assistive device    Baseline  4/12: 227f SBQC, 2 standing rest breaks    Time  3    Period  Weeks    Status  On-going      PT SHORT TERM GOAL #2   Title  Pt to be able to single leg sance for 10" bilaterally to decrease risk of falls     Baseline  4/12: RLE 30 sec, LLE 3 sec, very unsteady on LLE    Time  3    Period  Weeks    Status  Partially Met      PT SHORT TERM GOAL #3   Title  Pt back and LE mobility to increase to be able to don socks and shoes without difficulty.     Baseline  4/12: still difficult    Time  3    Period  Weeks    Status  On-going      PT SHORT TERM GOAL #4   Title  PT LE strength to have increased B to be able to come sit to stand with one UE assist without difficulty.     Baseline  4/12: able to perform STS with RUE support    Time  3    Period  Weeks    Status  Achieved      PT SHORT TERM GOAL #5   Title  Pt to feel confident walkng with a cane inside     Baseline  4/12: feels confident with cane inside    Time  3    Period  Weeks    Status  Achieved      PT  SHORT  TERM GOAL #6   Title  When okay by surgeon pt to be completing a HEP for the cervical spine to promote functional ROM     Baseline  4/12: per most recent MD order, "Passive for neck and UE's until 07/24/17 then progress as tolerated without restrictions"    Time  3    Period  Weeks    Status  On-going        PT Long Term Goals - 07/09/17 1138      PT LONG TERM GOAL #1   Title  PT to be able to ambulate 400 feet in three minutes with least assistive device     Baseline  4/12: 220f SBQC, 2 standing rest breaks    Time  6    Period  Weeks    Status  On-going      PT LONG TERM GOAL #2   Title  PT to be able to single leg stance for 20" to allow pt to feel comfortable walking in his yard.     Baseline  /12: RLE 30 sec, LLE 3 sec, very unsteady on LLE    Time  6    Period  Weeks    Status  Partially Met      PT LONG TERM GOAL #3   Title  PT LE and core strength to have increased to allow pt to be able to ascend and descend a flight of steps in a reciprocal manner     Baseline  4/12: step-to with BUE support    Time  6    Period  Weeks    Status  On-going      PT LONG TERM GOAL #4   Title  Pt to feel confident walking in the community without an assistive device     Baseline  4/12: still utilizing SBQC    Time  6    Period  Weeks    Status  On-going      PT LONG TERM GOAL #5   Title  PT cervical ROM to be improved to allow pt to be able to look to his blind spot to feel confident in driving     Baseline  4/12: MD cleared pt to drive but states that he is scared to turn his neck     Time  6    Period  Weeks    Status  On-going        Plan - 07/28/17 1128    Clinical Impression Statement  This session focueds on advancing balance and postural training. Patient continued to ambulate without assistive device and could benefit from auditory stimulus to increase cadence for more normalized gait at next session. Patient progressed tandem balance training today with bil UE  overhead flexion and initiated postural theraband strengthening. He reported felelign his muscles working and burnign from fatigue. He denied pain with the exerices. He continues to grimace during cervical excursion exercises and was educated that he should perform these in a pain free range of motion to improve comfort with mobility. HEP was updated today wto include postural and gentle cervical ROM to improve independence with strengthenign and mobility. He will continue to benefit from skilled PT services to address impairments and progress towards goals. Re-assess next session.    Rehab Potential  Good    PT Frequency  3x / week    PT Duration  6 weeks    PT Treatment/Interventions  ADLs/Self Care Home Management;DME Instruction;Stair training;Functional mobility training;Gait training;Therapeutic  activities;Therapeutic exercise;Balance training;Neuromuscular re-education;Patient/family education;Manual techniques;Passive range of motion    PT Next Visit Plan  Re-assess next session and continue walking without assistive device (introduce metronome for improved cadence). Continue tandem gait on foam and tandem balance with overhead activities. Progress active cervical and shoulder motion (beginning 4/27 per MD).  Next visit begin postural strengthening.    PT Home Exercise Plan  07/28/17 - bil shoulder Er with theraband, cervical excursion;     Consulted and Agree with Plan of Care  Patient       Patient will benefit from skilled therapeutic intervention in order to improve the following deficits and impairments:  Abnormal gait, Decreased activity tolerance, Decreased balance, Decreased coordination, Decreased knowledge of use of DME, Decreased mobility, Decreased strength, Difficulty walking, Impaired perceived functional ability, Pain  Visit Diagnosis: Muscle weakness (generalized)  History of falling  Difficulty in walking, not elsewhere classified  Other abnormalities of gait and  mobility     Problem List Patient Active Problem List   Diagnosis Date Noted  . Spinal cord compression ([facility])   . Surgery, elective   . Cervical myelopathy ([facility])   . Syncope, vasovagal   . Weakness   . Generalized weakness 05/25/2017  . Left sided numbness 05/24/2017  . Right inguinal pain 04/20/2017  . Dysphagia 03/24/2017  . Bilateral inguinal hernia 05/29/2016  . 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 Hyperglycemia 05/18/2013  . Radicular pain of left lower extremity   . Dizziness 05/16/2013   Kipp Brood, PT, DPT Physical Therapist with [facility]  07/28/2017 11:37 AM    [facility] [address] [place], [name], [address] Phone: [phone]   Fax:  [phone]  Name: Zachary Nash MRN: [id number] Date of Birth: 02-17-1971

## 2017-07-30 ENCOUNTER — Encounter (HOSPITAL_COMMUNITY): Payer: Self-pay | Admitting: Physical Therapy

## 2017-07-30 ENCOUNTER — Ambulatory Visit (HOSPITAL_COMMUNITY): Payer: PRIVATE HEALTH INSURANCE | Admitting: Physical Therapy

## 2017-07-30 DIAGNOSIS — Z9181 History of falling: Secondary | ICD-10-CM

## 2017-07-30 DIAGNOSIS — M6281 Muscle weakness (generalized): Secondary | ICD-10-CM

## 2017-07-30 DIAGNOSIS — R262 Difficulty in walking, not elsewhere classified: Secondary | ICD-10-CM

## 2017-07-30 DIAGNOSIS — R2689 Other abnormalities of gait and mobility: Secondary | ICD-10-CM

## 2017-07-30 NOTE — Therapy (Signed)
[place], [name], [address] Phone: [phone]   Fax:  [phone]  Physical Therapy Treatment  Patient Details  Name: Zachary Nash MRN: [id number] Date of Birth: [birth year] Referring Provider: Allyn Kenner surgeon Phylliss Bob   Encounter Date: 07/30/2017  PT End of Session - 07/30/17 1146    Visit Number  18    Number of Visits  24    Date for PT Re-Evaluation  08/02/17    Authorization Type  [employer]    Authorization Time Period  06/21/17-07/31/17    Authorization - Visit Number  19 had HHPT    Authorization - Number of Visits  30    PT Start Time  0950    PT Stop Time  1030    PT Time Calculation (min)  40 min    Equipment Utilized During Treatment  Gait belt;Other (comment) Left foot boot    Activity Tolerance  Patient tolerated treatment well    Behavior During Therapy  WFL for tasks assessed/performed       Past Medical History:  Diagnosis Date  . Anxiety   . Asthma   . Bilateral inguinal hernia 05/29/2016  . Complication of anesthesia    states he stopped breathing one time with anesthesia  . Constipation   . Fatty liver   . GERD (gastroesophageal reflux disease)   . History of kidney stones   . Pneumonia   . PONV (postoperative nausea and vomiting)   . Radicular pain of left lower extremity     Past Surgical History:  Procedure Laterality Date  . ANTERIOR CERVICAL DECOMP/DISCECTOMY FUSION N/A 05/26/2017   Procedure: ANTERIOR CERVICAL DECOMPRESSION/DISCECTOMY FUSION TWO LEVELS Cervical five-six, Cervical six-seven;  Surgeon: Phylliss Bob, MD;  Location: [facility];  Service: Orthopedics;  Laterality: N/A;  . CHOLECYSTECTOMY    . ESOPHAGEAL DILATION N/A 03/24/2017   Procedure: ESOPHAGEAL DILATION;  Surgeon: Rogene Houston, MD;  [facility];  Service: Endoscopy;  Laterality: N/A;  . ESOPHAGOGASTRODUODENOSCOPY N/A 03/24/2017   Procedure: ESOPHAGOGASTRODUODENOSCOPY (EGD);   Surgeon: Rogene Houston, MD;  [facility];  Service: Endoscopy;  Laterality: N/A;  1155  . INGUINAL HERNIA REPAIR Bilateral 05/29/2016   Procedure: OPEN HERNIA REPAIR INGUINAL ADULT BILATERAL WITH insertion of MESH;  Surgeon: Fanny Skates, MD;  Location: [facility];  Service: General;  Laterality: Bilateral;  . Lipoma removed      There were no vitals filed for this visit.  Subjective Assessment - 07/30/17 0954    Subjective  Patient arrives reporting he is a little sore from yesterday. He reprots he knows he is doing much better than he was when he began therapy. He reports he is getting more comfortable walking without teh quad cane but still brought it today. He had a NCV test yesterday and c/o how uncomfortable the test was.    Pertinent History  Cervical fusion ACDF C5-7    Limitations  Lifting;Standing;Walking;House hold activities    Patient Stated Goals  To get as much function as he can, walk without assistive device.  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     Pain Onset  More than a month ago         [facility] PT Assessment - 07/30/17 0001      Assessment   Medical Diagnosis  ACDF C5-6; C 6-7    Onset Date/Surgical Date  05/26/17  Next MD Visit  -- will have nerve test soon    Prior Therapy  [facility]       Precautions   Precautions  Fall    Required Braces or Orthoses  Other Brace/Splint    Other Brace/Splint  walking boot LLE      Restrictions   Weight Bearing Restrictions  No      Home Environment   Living Environment  Private residence    Home Access  Stairs to enter    Entrance Stairs-Number of Steps  [place]  One level    [place] - 4 wheels;Shower seat      Prior Function   Level of Independence  Independent    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     Leisure  go to races; preach, fish, sing, bowl       Cognition   Overall  Cognitive Status  Within Functional Limits for tasks assessed      Observation/Other Assessments   Focus on Therapeutic Outcomes (FOTO)   --      Functional Tests   Functional tests  Single leg stance;Sit to Stand      Single Leg Stance   Comments  30 sec RLE, 4sec LLE,  was Rt LE  30; Lt was 3       Sit to Stand   Comments  able to perform with RUE support      ROM / Strength   AROM / PROM / Strength  AROM;Strength      AROM   AROM Assessment Site  Shoulder;Cervical    Right/Left Shoulder  Right;Left    Right Shoulder Flexion  150 Degrees    Right Shoulder ABduction  150 Degrees    Right Shoulder Internal Rotation  60 Degrees    Right Shoulder External Rotation  70 Degrees    Left Shoulder Flexion  150 Degrees    Left Shoulder ABduction  155 Degrees    Left Shoulder Internal Rotation  70 Degrees    Left Shoulder External Rotation  80 Degrees    Cervical Flexion  45    Cervical Extension  50    Cervical - Right Side Bend  30    Cervical - Left Side Bend  25    Cervical - Right Rotation  60    Cervical - Left Rotation  60      PROM   Overall PROM Comments  --    Right Shoulder Flexion  160 Degrees was 115     Right Shoulder ABduction  160 Degrees    Right Shoulder Internal Rotation  80 Degrees    Right Shoulder External Rotation  90 Degrees    Left Shoulder Flexion  160 Degrees neutral    Left Shoulder ABduction  180 Degrees    Left Shoulder Internal Rotation  85 Degrees    Left Shoulder External Rotation  85 Degrees    Left Elbow Flexion  145    Left Elbow Extension  0    Cervical Flexion  --    Cervical Extension  --    Cervical - Right Side Bend  --    Cervical - Left Side Bend  --    Cervical - Right Rotation  --    Cervical - Left Rotation  --      Strength   Right/Left Hip  Right;Left    Right Hip Flexion  3+/5    Right Hip Extension  4/5    Right Hip ABduction  5/5    Left Hip Flexion  4/5    Left Hip Extension  4/5    Left Hip ABduction  4+/5     Right/Left Knee  Right;Left    Right Knee Flexion  5/5    Right Knee Extension  5/5 was 3/5 on 06/21/2017    Left Knee Flexion  4-/5    Left Knee Extension  5/5 was 3/5 on 2019     Right Ankle Dorsiflexion  5/5 was 3/5     Right Ankle Plantar Flexion  3+/5 was 2/5     Left Ankle Dorsiflexion  -- Pt is in a boot.     Left Ankle Plantar Flexion  --      Ambulation/Gait   Ambulation/Gait  Yes    Ambulation/Gait Assistance  6: Modified independent (Device/Increase time)    Ambulation Distance (Feet)  452 Feet 3MWT    Assistive device  --    Gait Comments  --                        Balance Exercises - 07/30/17 1144      Balance Exercises: Standing   Standing Eyes Opened  Narrow base of support (BOS);Head turns;Foam/compliant surface x10    Tandem Stance  Eyes open;Foam/compliant surface;Limitations lifting 2# bar into flextion x 10     SLS  Eyes open;5 reps    Rockerboard  Anterior/posterior;Lateral;30 seconds          PT Short Term Goals - 07/30/17 1040      PT SHORT TERM GOAL #1   Title  Pt to be able to ambulate 226 feet in 3 minute time with least assistive device  (Pended)     Baseline  4/12: 248f SBQC, 2 standing rest breaks  (Pended)     Time  3  (Pended)     Period  Weeks  (Pended)     Status  Achieved  (Pended)       PT SHORT TERM GOAL #2   Title  Pt to be able to single leg sance for 10" bilaterally to decrease risk of falls   (Pended)     Baseline  4/12: RLE 30 sec, LLE 4  (Pended)     Time  3  (Pended)     Period  Weeks  (Pended)     Status  Partially Met  (Pended)       PT SHORT TERM GOAL #3   Title  Pt back and LE mobility to increase to be able to don socks and shoes without difficulty.   (Pended)     Baseline  4/12: still difficult  (Pended)     Time  3  (Pended)     Period  Weeks  (Pended)     Status  Achieved  (Pended)       PT SHORT TERM GOAL #4   Title  PT LE strength to have increased B to be able to come sit to stand with one  UE assist without difficulty.   (Pended)     Baseline  4/12: able to perform STS with RUE support  (Pended)     Time  3  (Pended)     Period  Weeks  (Pended)     Status  Achieved  (Pended)  PT SHORT TERM GOAL #5   Title  Pt to feel confident walkng with a cane inside   (Pended)     Baseline  4/12: feels confident with cane inside  (Pended)     Time  3  (Pended)     Period  Weeks  (Pended)     Status  Achieved  (Pended)       PT SHORT TERM GOAL #6   Title  When okay by surgeon pt to be completing a HEP for the cervical spine to promote functional ROM   (Pended)     Baseline  4/12: per most recent MD order, "Passive for neck and UE's until 07/24/17 then progress as tolerated without restrictions"  (Pended)     Time  3  (Pended)     Period  Weeks  (Pended)     Status  Achieved  (Pended)         PT Long Term Goals - 07/30/17 1033      PT LONG TERM GOAL #1   Title  PT to be able to ambulate 400 feet in three minutes with least assistive device     Baseline  07/30/2017:  452 with no assistive device with camboot on.     Time  6    Period  Weeks    Status  Achieved      PT LONG TERM GOAL #2   Title  PT to be able to single leg stance for 20" to allow pt to feel comfortable walking in his yard.     Baseline  /12: RLE 30 sec, LLE 3 sec, very unsteady on LLE    Time  6    Period  Weeks    Status  Partially Met      PT LONG TERM GOAL #3   Title  PT LE and core strength to have increased to allow pt to be able to ascend and descend a flight of steps in a reciprocal manner     Time  6    Period  Weeks    Status  Achieved      PT LONG TERM GOAL #4   Title  Pt to feel confident walking in the community without an assistive device     Baseline  4/12: still utilizing SBQC    Time  6    Period  Weeks    Status  Achieved      PT LONG TERM GOAL #5   Title  PT cervical ROM to be improved to allow pt to be able to look to his blind spot to feel confident in driving     Baseline   4/12: MD cleared pt to drive but states that he is scared to turn his neck     Time  6    Period  Weeks    Status  Partially Met            Plan - 07/30/17 1147    Clinical Impression Statement  Pt reassessed this session.  He continus to wear an immobilizing boot but has no restrictions other than this.  Pt ROM is functional.  Strength is continuing to improve but he continues to have difficulty with balance.  4/6 STG and 4/6 LTG have been made.  Pt has 8 more visit for Rehab due to insurance but will continue to benefit from skilled physical therapy to maximize his functioning ability.     Rehab Potential  Good    PT  Frequency  2x / week    PT Duration  4 weeks total of 10 weeks     PT Treatment/Interventions  ADLs/Self Care Home Management;DME Instruction;Stair training;Functional mobility training;Gait training;Therapeutic activities;Therapeutic exercise;Balance training;Neuromuscular re-education;Patient/family education;Manual techniques;Passive range of motion    PT Next Visit Plan   (introduce metronome for improved cadence). Continue tandem gait on foam and tandem balance with overhead activities. Progress AROM and strengthening  cervical and shoulder motion.      PT Home Exercise Plan  07/28/17 - bil shoulder Er with theraband, cervical excursion;     Consulted and Agree with Plan of Care  Patient       Patient will benefit from skilled therapeutic intervention in order to improve the following deficits and impairments:  Abnormal gait, Decreased activity tolerance, Decreased balance, Decreased coordination, Decreased knowledge of use of DME, Decreased mobility, Decreased strength, Difficulty walking, Impaired perceived functional ability, Pain  Visit Diagnosis: Muscle weakness (generalized) - Plan: PT plan of care cert/re-cert  Difficulty in walking, not elsewhere classified - Plan: PT plan of care cert/re-cert  Other abnormalities of gait and mobility - Plan: PT plan of care  cert/re-cert  History of falling - Plan: PT plan of care cert/re-cert     Problem List Patient Active Problem List   Diagnosis Date Noted  . Spinal cord compression ([facility])   . Surgery, elective   . Cervical myelopathy ([facility])   . Syncope, vasovagal   . Weakness   . Generalized weakness 05/25/2017  . Left sided numbness 05/24/2017  . Right inguinal pain 04/20/2017  . Dysphagia 03/24/2017  . Bilateral inguinal hernia 05/29/2016  . 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 Hyperglycemia 05/18/2013  . Radicular pain of left lower extremity   . Dizziness 05/16/2013    Rayetta Humphrey, PT CLT [phone] 07/30/2017, 11:56 AM  [facility] [address] [place], [name], [address] Phone: [phone]   Fax:  [phone]  Name: Zachary Nash MRN: [id number] Date of Birth: November 05, 1970

## 2017-08-02 ENCOUNTER — Encounter (HOSPITAL_COMMUNITY): Payer: Self-pay | Admitting: Physical Therapy

## 2017-08-02 ENCOUNTER — Ambulatory Visit (HOSPITAL_COMMUNITY): Payer: PRIVATE HEALTH INSURANCE | Admitting: Physical Therapy

## 2017-08-02 DIAGNOSIS — M6281 Muscle weakness (generalized): Secondary | ICD-10-CM

## 2017-08-02 DIAGNOSIS — R2689 Other abnormalities of gait and mobility: Secondary | ICD-10-CM

## 2017-08-02 DIAGNOSIS — R262 Difficulty in walking, not elsewhere classified: Secondary | ICD-10-CM

## 2017-08-02 NOTE — Therapy (Signed)
[name], [name], [address] Phone: [phone]   Fax:  [phone]  Physical Therapy Treatment  Patient Details  Name: Zachary Nash MRN: [id number] Date of Birth: [birth year] Referring Provider: Allyn Kenner surgeon Phylliss Bob   Encounter Date: 08/02/2017  PT End of Session - 08/02/17 1153    Visit Number  19    Number of Visits  24    Date for PT Re-Evaluation  08/02/17    Authorization Type  [employer]    Authorization Time Period  06/21/17-07/31/17    Authorization - Visit Number  72 had HHPT    Authorization - Number of Visits  30    PT Start Time  1130 PT here on time but on the phone until 1130     PT Stop Time  1200    PT Time Calculation (min)  30 min    Equipment Utilized During Treatment  Gait belt;Other (comment) Left foot boot    Activity Tolerance  Patient tolerated treatment well    Behavior During Therapy  WFL for tasks assessed/performed       Past Medical History:  Diagnosis Date  . Anxiety   . Asthma   . Bilateral inguinal hernia 05/29/2016  . Complication of anesthesia    states he stopped breathing one time with anesthesia  . Constipation   . Fatty liver   . GERD (gastroesophageal reflux disease)   . History of kidney stones   . Pneumonia   . PONV (postoperative nausea and vomiting)   . Radicular pain of left lower extremity     Past Surgical History:  Procedure Laterality Date  . ANTERIOR CERVICAL DECOMP/DISCECTOMY FUSION N/A 05/26/2017   Procedure: ANTERIOR CERVICAL DECOMPRESSION/DISCECTOMY FUSION TWO LEVELS Cervical five-six, Cervical six-seven;  Surgeon: Phylliss Bob, MD;  Location: [facility];  Service: Orthopedics;  Laterality: N/A;  . CHOLECYSTECTOMY    . ESOPHAGEAL DILATION N/A 03/24/2017   Procedure: ESOPHAGEAL DILATION;  Surgeon: Rogene Houston, MD;  [facility];  Service: Endoscopy;  Laterality: N/A;  . ESOPHAGOGASTRODUODENOSCOPY N/A 03/24/2017   Procedure: ESOPHAGOGASTRODUODENOSCOPY (EGD);  Surgeon: Rogene Houston, MD;  [facility];  Service: Endoscopy;  Laterality: N/A;  1155  . INGUINAL HERNIA REPAIR Bilateral 05/29/2016   Procedure: OPEN HERNIA REPAIR INGUINAL ADULT BILATERAL WITH insertion of MESH;  Surgeon: Fanny Skates, MD;  Location: [facility];  Service: General;  Laterality: Bilateral;  . Lipoma removed      There were no vitals filed for this visit.  Subjective Assessment - 08/02/17 1132    Subjective  PT has no complaints.  PT going to see about his boot this week     Pertinent History  Cervical fusion ACDF C5-7    Limitations  Lifting;Standing;Walking;House hold activities    Patient Stated Goals  To get as much function as he can, walk without assistive device.  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     Currently in Pain?  No/denies    Pain Onset  More than a month ago                       [facility] Adult PT Treatment/Exercise - 08/02/17 0001      Exercises   Exercises  Neck;Shoulder;Knee/Hip      Lumbar Exercises: Standing   Wall Slides  10 reps    Other Standing Lumbar  Exercises  vector stance 10" x 3     Other Standing Lumbar Exercises  warrior I and II x 30 seconds each x 2 each       Lumbar Exercises: Seated   Sit to Stand  15 reps      Lumbar Exercises: Quadruped   Opposite Arm/Leg Raise  Right arm/Left leg;Left arm/Right leg      Shoulder Exercises: Standing   Flexion  Strengthening;Both;10 reps;Weights    Shoulder Flexion Weight (lbs)  2    ABduction  Strengthening;Both;10 reps;Weights    Shoulder ABduction Weight (lbs)  2    Other Standing Exercises  wall push up x 10     Other Standing Exercises  cervical 3-D excurision x 3.           Balance Exercises - 08/02/17 1202      Balance Exercises: Standing   Tandem Stance  Eyes open;Foam/compliant surface;Limitations lifting 4# bar into flexion x 10; then chest press x 10            PT  Short Term Goals - 07/30/17 1040      PT SHORT TERM GOAL #1   Title  Pt to be able to ambulate 226 feet in 3 minute time with least assistive device  (Pended)     Baseline  4/12: 270f SBQC, 2 standing rest breaks  (Pended)     Time  3  (Pended)     Period  Weeks  (Pended)     Status  Achieved  (Pended)       PT SHORT TERM GOAL #2   Title  Pt to be able to single leg sance for 10" bilaterally to decrease risk of falls   (Pended)     Baseline  4/12: RLE 30 sec, LLE 4  (Pended)     Time  3  (Pended)     Period  Weeks  (Pended)     Status  Partially Met  (Pended)       PT SHORT TERM GOAL #3   Title  Pt back and LE mobility to increase to be able to don socks and shoes without difficulty.   (Pended)     Baseline  4/12: still difficult  (Pended)     Time  3  (Pended)     Period  Weeks  (Pended)     Status  Achieved  (Pended)       PT SHORT TERM GOAL #4   Title  PT LE strength to have increased B to be able to come sit to stand with one UE assist without difficulty.   (Pended)     Baseline  4/12: able to perform STS with RUE support  (Pended)     Time  3  (Pended)     Period  Weeks  (Pended)     Status  Achieved  (Pended)       PT SHORT TERM GOAL #5   Title  Pt to feel confident walkng with a cane inside   (Pended)     Baseline  4/12: feels confident with cane inside  (Pended)     Time  3  (Pended)     Period  Weeks  (Pended)     Status  Achieved  (Pended)       PT SHORT TERM GOAL #6   Title  When okay by surgeon pt to be completing a HEP for the cervical spine to promote functional ROM   (Pended)  Baseline  4/12: per most recent MD order, "Passive for neck and UE's until 07/24/17 then progress as tolerated without restrictions"  (Pended)     Time  3  (Pended)     Period  Weeks  (Pended)     Status  Achieved  (Pended)         PT Long Term Goals - 07/30/17 1033      PT LONG TERM GOAL #1   Title  PT to be able to ambulate 400 feet in three minutes with least assistive  device     Baseline  07/30/2017:  452 with no assistive device with camboot on.     Time  6    Period  Weeks    Status  Achieved      PT LONG TERM GOAL #2   Title  PT to be able to single leg stance for 20" to allow pt to feel comfortable walking in his yard.     Baseline  /12: RLE 30 sec, LLE 3 sec, very unsteady on LLE    Time  6    Period  Weeks    Status  Partially Met      PT LONG TERM GOAL #3   Title  PT LE and core strength to have increased to allow pt to be able to ascend and descend a flight of steps in a reciprocal manner     Time  6    Period  Weeks    Status  Achieved      PT LONG TERM GOAL #4   Title  Pt to feel confident walking in the community without an assistive device     Baseline  4/12: still utilizing SBQC    Time  6    Period  Weeks    Status  Achieved      PT LONG TERM GOAL #5   Title  PT cervical ROM to be improved to allow pt to be able to look to his blind spot to feel confident in driving     Baseline  4/12: MD cleared pt to drive but states that he is scared to turn his neck     Time  6    Period  Weeks    Status  Partially Met            Plan - 08/02/17 1203    Clinical Impression Statement  Started session 15 minutes late due to pt being on the phone when he came into therapy.  Added standing shoulder flexion and abdution with 2#; wall pushup, and opposite arm/leg raise .  Pt able to demonstrate good techniquie but needed verbal cuing ,(as wall as manual with quadriped activity).     Rehab Potential  Good    PT Frequency  2x / week    PT Duration  4 weeks total of 10 weeks     PT Treatment/Interventions  ADLs/Self Care Home Management;DME Instruction;Stair training;Functional mobility training;Gait training;Therapeutic activities;Therapeutic exercise;Balance training;Neuromuscular re-education;Patient/family education;Manual techniques;Passive range of motion    PT Next Visit Plan  . Continue tandem gait on foam and tandem balance with  overhead activities. Progress strengthening for cervical and shoulder motion.      PT Home Exercise Plan  07/28/17 - bil shoulder Er with theraband, cervical excursion;     Consulted and Agree with Plan of Care  Patient       Patient will benefit from skilled therapeutic intervention in order to improve the following deficits and  impairments:  Abnormal gait, Decreased activity tolerance, Decreased balance, Decreased coordination, Decreased knowledge of use of DME, Decreased mobility, Decreased strength, Difficulty walking, Impaired perceived functional ability, Pain  Visit Diagnosis: Muscle weakness (generalized)  Difficulty in walking, not elsewhere classified  Other abnormalities of gait and mobility     Problem List Patient Active Problem List   Diagnosis Date Noted  . Spinal cord compression ([facility])   . Surgery, elective   . Cervical myelopathy ([place])   . Syncope, vasovagal   . Weakness   . Generalized weakness 05/25/2017  . Left sided numbness 05/24/2017  . Right inguinal pain 04/20/2017  . Dysphagia 03/24/2017  . Bilateral inguinal hernia 05/29/2016  . 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 Hyperglycemia 05/18/2013  . Radicular pain of left lower extremity   . Dizziness 05/16/2013    Rayetta Humphrey, PT CLT [phone] 08/02/2017, 12:07 PM  [facility] [address] [place], [name], [address] Phone: [phone]   Fax:  [phone]  Name: Zachary Nash MRN: [id number] Date of Birth: 1970-04-03

## 2017-08-05 ENCOUNTER — Ambulatory Visit (HOSPITAL_COMMUNITY): Payer: PRIVATE HEALTH INSURANCE | Admitting: Physical Therapy

## 2017-08-05 DIAGNOSIS — M6281 Muscle weakness (generalized): Secondary | ICD-10-CM | POA: Diagnosis not present

## 2017-08-05 DIAGNOSIS — R2689 Other abnormalities of gait and mobility: Secondary | ICD-10-CM

## 2017-08-05 DIAGNOSIS — Z9181 History of falling: Secondary | ICD-10-CM

## 2017-08-05 DIAGNOSIS — R262 Difficulty in walking, not elsewhere classified: Secondary | ICD-10-CM

## 2017-08-05 NOTE — Therapy (Signed)
[place], [name], [address] Phone: [phone]   Fax:  [phone]  Physical Therapy Treatment  Patient Details  Name: Zachary Nash MRN: [id number] Date of Birth: 1970/05/26 Referring Provider: Allyn Kenner surgeon Phylliss Bob   Encounter Date: 08/05/2017  PT End of Session - 08/05/17 1022    Visit Number  20    Number of Visits  24    Date for PT Re-Evaluation  08/27/17    Authorization Type  [employer]    Authorization Time Period  cert 8/4-1/3; 30 visit max with [facility] - Visit Number  23 had HHPT    PT Start Time  1000    PT Stop Time  1030    PT Time Calculation (min)  30 min    Equipment Utilized During Treatment  Gait belt;Other (comment) Left foot boot    Activity Tolerance  Patient tolerated treatment well    Behavior During Therapy  WFL for tasks assessed/performed       Past Medical History:  Diagnosis Date  . Anxiety   . Asthma   . Bilateral inguinal hernia 05/29/2016  . Complication of anesthesia    states he stopped breathing one time with anesthesia  . Constipation   . Fatty liver   . GERD (gastroesophageal reflux disease)   . History of kidney stones   . Pneumonia   . PONV (postoperative nausea and vomiting)   . Radicular pain of left lower extremity     Past Surgical History:  Procedure Laterality Date  . ANTERIOR CERVICAL DECOMP/DISCECTOMY FUSION N/A 05/26/2017   Procedure: ANTERIOR CERVICAL DECOMPRESSION/DISCECTOMY FUSION TWO LEVELS Cervical five-six, Cervical six-seven;  Surgeon: Phylliss Bob, MD;  Location: [facility];  Service: Orthopedics;  Laterality: N/A;  . CHOLECYSTECTOMY    . ESOPHAGEAL DILATION N/A 03/24/2017   Procedure: ESOPHAGEAL DILATION;  Surgeon: Rogene Houston, MD;  [facility];  Service: Endoscopy;  Laterality: N/A;  . ESOPHAGOGASTRODUODENOSCOPY N/A 03/24/2017   Procedure: ESOPHAGOGASTRODUODENOSCOPY (EGD);  Surgeon: Rogene Houston, MD;  [facility];  Service: Endoscopy;  Laterality: N/A;  1155  . INGUINAL HERNIA REPAIR Bilateral 05/29/2016   Procedure: OPEN HERNIA REPAIR INGUINAL ADULT BILATERAL WITH insertion of MESH;  Surgeon: Fanny Skates, MD;  Location: [facility];  Service: General;  Laterality: Bilateral;  . Lipoma removed      There were no vitals filed for this visit.  Subjective Assessment - 08/05/17 1019    Subjective  PT returns today 15 minutes late for appt.  Returned to orthopedist yesterday and returns with new order to improve pain/function of Lt LE.  Pt continues to wear walking boot.  Gives pain rating of 7/10 in Lt UT and scap region.  States he's getting an MRI on his LB on monday.  Currently without LBP.  States his pain increased following last therapy session and states it was a hard session.    Currently in Pain?  Yes    Pain Score  7     Pain Location  Neck    Pain Orientation  Left    Pain Descriptors / Indicators  Aching;Sore;Spasm    Pain Type  Chronic pain    Pain Radiating Towards  into Lt shoulder and scapular region                       [facility] Adult PT Treatment/Exercise - 08/05/17 0001  Lumbar Exercises: Standing   Other Standing Lumbar Exercises  vector stance 10" x 3       Shoulder Exercises: Standing   Flexion  Strengthening;Both;Weights;20 reps;Limitations    Shoulder Flexion Weight (lbs)  2    Flexion Limitations  2 sets of 10 reps    ABduction  Strengthening;Both;Weights;20 reps;Limitations    Shoulder ABduction Weight (lbs)  2    ABduction Limitations  2 sets of 10 reps          Balance Exercises - 08/05/17 1041      Balance Exercises: Standing   Other Standing Exercises  tandem on foam, 10 reps each UE flexion 4# dowel, push outs 4# bar          PT Short Term Goals - 07/30/17 1040      PT SHORT TERM GOAL #1   Title  Pt to be able to ambulate 226 feet in 3 minute time with least assistive device  (Pended)     Baseline   4/12: 291f SBQC, 2 standing rest breaks  (Pended)     Time  3  (Pended)     Period  Weeks  (Pended)     Status  Achieved  (Pended)       PT SHORT TERM GOAL #2   Title  Pt to be able to single leg sance for 10" bilaterally to decrease risk of falls   (Pended)     Baseline  4/12: RLE 30 sec, LLE 4  (Pended)     Time  3  (Pended)     Period  Weeks  (Pended)     Status  Partially Met  (Pended)       PT SHORT TERM GOAL #3   Title  Pt back and LE mobility to increase to be able to don socks and shoes without difficulty.   (Pended)     Baseline  4/12: still difficult  (Pended)     Time  3  (Pended)     Period  Weeks  (Pended)     Status  Achieved  (Pended)       PT SHORT TERM GOAL #4   Title  PT LE strength to have increased B to be able to come sit to stand with one UE assist without difficulty.   (Pended)     Baseline  4/12: able to perform STS with RUE support  (Pended)     Time  3  (Pended)     Period  Weeks  (Pended)     Status  Achieved  (Pended)       PT SHORT TERM GOAL #5   Title  Pt to feel confident walkng with a cane inside   (Pended)     Baseline  4/12: feels confident with cane inside  (Pended)     Time  3  (Pended)     Period  Weeks  (Pended)     Status  Achieved  (Pended)       PT SHORT TERM GOAL #6   Title  When okay by surgeon pt to be completing a HEP for the cervical spine to promote functional ROM   (Pended)     Baseline  4/12: per most recent MD order, "Passive for neck and UE's until 07/24/17 then progress as tolerated without restrictions"  (Pended)     Time  3  (Pended)     Period  Weeks  (Pended)     Status  Achieved  (Pended)  PT Long Term Goals - 07/30/17 1033      PT LONG TERM GOAL #1   Title  PT to be able to ambulate 400 feet in three minutes with least assistive device     Baseline  07/30/2017:  452 with no assistive device with camboot on.     Time  6    Period  Weeks    Status  Achieved      PT LONG TERM GOAL #2   Title  PT to be  able to single leg stance for 20" to allow pt to feel comfortable walking in his yard.     Baseline  /12: RLE 30 sec, LLE 3 sec, very unsteady on LLE    Time  6    Period  Weeks    Status  Partially Met      PT LONG TERM GOAL #3   Title  PT LE and core strength to have increased to allow pt to be able to ascend and descend a flight of steps in a reciprocal manner     Time  6    Period  Weeks    Status  Achieved      PT LONG TERM GOAL #4   Title  Pt to feel confident walking in the community without an assistive device     Baseline  4/12: still utilizing SBQC    Time  6    Period  Weeks    Status  Achieved      PT LONG TERM GOAL #5   Title  PT cervical ROM to be improved to allow pt to be able to look to his blind spot to feel confident in driving     Baseline  4/12: MD cleared pt to drive but states that he is scared to turn his neck     Time  6    Period  Weeks    Status  Partially Met            Plan - 08/05/17 1024    Clinical Impression Statement  Pt late for appt.  Continued focus on improviing glutes and overall stabilty in lower and UE strengthening also addressed.  PT continues to c/o limited cervical ROM, however able to move freely during converstation and interactions in gym without pain reactions and only minor limitations.  Pt continues to require cues for posturing, especially decreasing forward head flexion during therex.  Instructed to add the wall push ups to HEP today.    Rehab Potential  Good    PT Frequency  2x / week    PT Duration  4 weeks total of 10 weeks     PT Treatment/Interventions  ADLs/Self Care Home Management;DME Instruction;Stair training;Functional mobility training;Gait training;Therapeutic activities;Therapeutic exercise;Balance training;Neuromuscular re-education;Patient/family education;Manual techniques;Passive range of motion    PT Next Visit Plan  Continue tandem gait on foam and tandem balance with overhead activities. Progress  strengthening for cervical and shoulder motion.  Update HEP next session.    PT Home Exercise Plan  07/28/17 - bil shoulder Er with theraband, cervical excursion;      Consulted and Agree with Plan of Care  Patient       Patient will benefit from skilled therapeutic intervention in order to improve the following deficits and impairments:  Abnormal gait, Decreased activity tolerance, Decreased balance, Decreased coordination, Decreased knowledge of use of DME, Decreased mobility, Decreased strength, Difficulty walking, Impaired perceived functional ability, Pain  Visit Diagnosis: Difficulty in walking, not  elsewhere classified  Other abnormalities of gait and mobility  Muscle weakness (generalized)  History of falling     Problem List Patient Active Problem List   Diagnosis Date Noted  . Spinal cord compression ([place] [facility])   . Surgery, elective   . Cervical myelopathy ([place])   . Syncope, vasovagal   . Weakness   . Generalized weakness 05/25/2017  . Left sided numbness 05/24/2017  . Right inguinal pain 04/20/2017  . Dysphagia 03/24/2017  . Bilateral inguinal hernia 05/29/2016  . 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 Hyperglycemia 05/18/2013  . Radicular pain of left lower extremity   . Dizziness 05/16/2013   Teena Irani, PTA/CLT [phone]  Teena Irani 08/05/2017, 10:43 AM  [facility] [address] [place], [name], [address] Phone: [phone]   Fax:  [phone]  Name: Zachary Nash MRN: [id number] Date of Birth: 12/10/1970

## 2017-08-09 ENCOUNTER — Ambulatory Visit (HOSPITAL_COMMUNITY): Payer: PRIVATE HEALTH INSURANCE | Admitting: Physical Therapy

## 2017-08-09 DIAGNOSIS — R2689 Other abnormalities of gait and mobility: Secondary | ICD-10-CM

## 2017-08-09 DIAGNOSIS — M6281 Muscle weakness (generalized): Secondary | ICD-10-CM

## 2017-08-09 DIAGNOSIS — R262 Difficulty in walking, not elsewhere classified: Secondary | ICD-10-CM

## 2017-08-09 DIAGNOSIS — Z9181 History of falling: Secondary | ICD-10-CM

## 2017-08-09 NOTE — Therapy (Signed)
[place], [name], [address] Phone: [phone]   Fax:  [phone]  Physical Therapy Treatment  Patient Details  Name: Zachary Nash MRN: [id number] Date of Birth: 1970/06/08 Referring Provider: Allyn Kenner surgeon Phylliss Bob   Encounter Date: 08/09/2017  PT End of Session - 08/09/17 1429    Visit Number  21    Number of Visits  24    Date for PT Re-Evaluation  08/27/17    Authorization Type  [employer]    Authorization Time Period  cert 5/3-2/0; 30 visit max with [employer]    Authorization - Visit Number  24 had HHPT    PT Start Time  2334    PT Stop Time  3568    PT Time Calculation (min)  38 min    Equipment Utilized During Treatment  Gait belt;Other (comment) Left foot boot    Activity Tolerance  Patient tolerated treatment well    Behavior During Therapy  WFL for tasks assessed/performed       Past Medical History:  Diagnosis Date  . Anxiety   . Asthma   . Bilateral inguinal hernia 05/29/2016  . Complication of anesthesia    states he stopped breathing one time with anesthesia  . Constipation   . Fatty liver   . GERD (gastroesophageal reflux disease)   . History of kidney stones   . Pneumonia   . PONV (postoperative nausea and vomiting)   . Radicular pain of left lower extremity     Past Surgical History:  Procedure Laterality Date  . ANTERIOR CERVICAL DECOMP/DISCECTOMY FUSION N/A 05/26/2017   Procedure: ANTERIOR CERVICAL DECOMPRESSION/DISCECTOMY FUSION TWO LEVELS Cervical five-six, Cervical six-seven;  Surgeon: Phylliss Bob, MD;  Location: [facility];  Service: Orthopedics;  Laterality: N/A;  . CHOLECYSTECTOMY    . ESOPHAGEAL DILATION N/A 03/24/2017   Procedure: ESOPHAGEAL DILATION;  Surgeon: Rogene Houston, MD;  [facility];  Service: Endoscopy;  Laterality: N/A;  . ESOPHAGOGASTRODUODENOSCOPY N/A 03/24/2017   Procedure: ESOPHAGOGASTRODUODENOSCOPY (EGD);  Surgeon: Rogene Houston, MD;  [facility];  Service: Endoscopy;  Laterality: N/A;  1155  . INGUINAL HERNIA REPAIR Bilateral 05/29/2016   Procedure: OPEN HERNIA REPAIR INGUINAL ADULT BILATERAL WITH insertion of MESH;  Surgeon: Fanny Skates, MD;  Location: [facility];  Service: General;  Laterality: Bilateral;  . Lipoma removed      There were no vitals filed for this visit.  Subjective Assessment - 08/09/17 1400    Subjective  pt was 7 minutes late for appt reporting he had to finish his "honey-do" list.  STates he's getting his MRI today of his thoracic and lumbar spine.  Currently 5/10 in LT UE, mid back and neck.    Currently in Pain?  Yes    Pain Score  5     Pain Location  Neck    Pain Orientation  Left    Pain Descriptors / Indicators  Aching;Sore;Spasm    Pain Type  Chronic pain                       [facility] Adult PT Treatment/Exercise - 08/09/17 0001      Lumbar Exercises: Standing   Other Standing Lumbar Exercises  vector stance 10" x 5       Lumbar Exercises: Seated   Sit to Stand  15 reps      Lumbar Exercises: Quadruped   Opposite Arm/Leg  Raise  Right arm/Left leg;Left arm/Right leg;10 reps      Shoulder Exercises: Standing   Flexion  Strengthening;Both;Weights;20 reps;Limitations    Shoulder Flexion Weight (lbs)  3    Flexion Limitations  2 sets of 10 reps    ABduction  Strengthening;Both;Weights;20 reps;Limitations    Shoulder ABduction Weight (lbs)  3    ABduction Limitations  2 sets of 10 reps    Other Standing Exercises  wall push up 2x 10           Balance Exercises - 08/09/17 1407      Balance Exercises: Standing   Other Standing Exercises  tandem on foam, 10 reps each UE flexion 4# dowel, push outs 4# bar, rotations 4# each way with each LE          PT Short Term Goals - 07/30/17 1040      PT SHORT TERM GOAL #1   Title  Pt to be able to ambulate 226 feet in 3 minute time with least assistive device  (Pended)     Baseline  4/12: 295f  SBQC, 2 standing rest breaks  (Pended)     Time  3  (Pended)     Period  Weeks  (Pended)     Status  Achieved  (Pended)       PT SHORT TERM GOAL #2   Title  Pt to be able to single leg sance for 10" bilaterally to decrease risk of falls   (Pended)     Baseline  4/12: RLE 30 sec, LLE 4  (Pended)     Time  3  (Pended)     Period  Weeks  (Pended)     Status  Partially Met  (Pended)       PT SHORT TERM GOAL #3   Title  Pt back and LE mobility to increase to be able to don socks and shoes without difficulty.   (Pended)     Baseline  4/12: still difficult  (Pended)     Time  3  (Pended)     Period  Weeks  (Pended)     Status  Achieved  (Pended)       PT SHORT TERM GOAL #4   Title  PT LE strength to have increased B to be able to come sit to stand with one UE assist without difficulty.   (Pended)     Baseline  4/12: able to perform STS with RUE support  (Pended)     Time  3  (Pended)     Period  Weeks  (Pended)     Status  Achieved  (Pended)       PT SHORT TERM GOAL #5   Title  Pt to feel confident walkng with a cane inside   (Pended)     Baseline  4/12: feels confident with cane inside  (Pended)     Time  3  (Pended)     Period  Weeks  (Pended)     Status  Achieved  (Pended)       PT SHORT TERM GOAL #6   Title  When okay by surgeon pt to be completing a HEP for the cervical spine to promote functional ROM   (Pended)     Baseline  4/12: per most recent MD order, "Passive for neck and UE's until 07/24/17 then progress as tolerated without restrictions"  (Pended)     Time  3  (Pended)     Period  Weeks  (  Pended)     Status  Achieved  (Pended)         PT Long Term Goals - 07/30/17 1033      PT LONG TERM GOAL #1   Title  PT to be able to ambulate 400 feet in three minutes with least assistive device     Baseline  07/30/2017:  452 with no assistive device with camboot on.     Time  6    Period  Weeks    Status  Achieved      PT LONG TERM GOAL #2   Title  PT to be able to single  leg stance for 20" to allow pt to feel comfortable walking in his yard.     Baseline  /12: RLE 30 sec, LLE 3 sec, very unsteady on LLE    Time  6    Period  Weeks    Status  Partially Met      PT LONG TERM GOAL #3   Title  PT LE and core strength to have increased to allow pt to be able to ascend and descend a flight of steps in a reciprocal manner     Time  6    Period  Weeks    Status  Achieved      PT LONG TERM GOAL #4   Title  Pt to feel confident walking in the community without an assistive device     Baseline  4/12: still utilizing SBQC    Time  6    Period  Weeks    Status  Achieved      PT LONG TERM GOAL #5   Title  PT cervical ROM to be improved to allow pt to be able to look to his blind spot to feel confident in driving     Baseline  4/12: MD cleared pt to drive but states that he is scared to turn his neck     Time  6    Period  Weeks    Status  Partially Met            Plan - 08/09/17 1430    Clinical Impression Statement  Pt able to complete all exercises this session without c/o pain.  Increased to 3# dumbells with UE exercises and patient able to complete 2 sets of 10 reps without diffiuclty.  Added UE rotation in with tandem stance to further challenge stability.  Pt jovial during session without signs/symptoms of distress.  Overall progressing well.      Rehab Potential  Good    PT Frequency  2x / week    PT Duration  4 weeks total of 10 weeks     PT Treatment/Interventions  ADLs/Self Care Home Management;DME Instruction;Stair training;Functional mobility training;Gait training;Therapeutic activities;Therapeutic exercise;Balance training;Neuromuscular re-education;Patient/family education;Manual techniques;Passive range of motion    PT Next Visit Plan  Continue tandem gait on foam and tandem balance with overhead activities. Progress strengthening for cervical and shoulder motion.     PT Home Exercise Plan  07/28/17 - bil shoulder Er with theraband, cervical  excursion;      Consulted and Agree with Plan of Care  Patient       Patient will benefit from skilled therapeutic intervention in order to improve the following deficits and impairments:  Abnormal gait, Decreased activity tolerance, Decreased balance, Decreased coordination, Decreased knowledge of use of DME, Decreased mobility, Decreased strength, Difficulty walking, Impaired perceived functional ability, Pain  Visit Diagnosis: Difficulty in walking, not  elsewhere classified  Other abnormalities of gait and mobility  Muscle weakness (generalized)  History of falling     Problem List Patient Active Problem List   Diagnosis Date Noted  . Spinal cord compression ([place])   . Surgery, elective   . Cervical myelopathy ([place])   . Syncope, vasovagal   . Weakness   . Generalized weakness 05/25/2017  . Left sided numbness 05/24/2017  . Right inguinal pain 04/20/2017  . Dysphagia 03/24/2017  . Bilateral inguinal hernia 05/29/2016  . 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 Hyperglycemia 05/18/2013  . Radicular pain of left lower extremity   . Dizziness 05/16/2013   Teena Irani, PTA/CLT [phone]  Teena Irani 08/09/2017, 2:33 PM  [facility] [address] [place], [name], [address] Phone: [phone]   Fax:  [phone]  Name: EARLY STEEL MRN: [id number] Date of Birth: 1970/08/28

## 2017-08-12 ENCOUNTER — Ambulatory Visit (HOSPITAL_COMMUNITY): Payer: PRIVATE HEALTH INSURANCE | Admitting: Physical Therapy

## 2017-08-12 ENCOUNTER — Other Ambulatory Visit (HOSPITAL_COMMUNITY): Payer: Self-pay | Admitting: Orthopedic Surgery

## 2017-08-12 ENCOUNTER — Telehealth (HOSPITAL_COMMUNITY): Payer: Self-pay | Admitting: Physical Therapy

## 2017-08-12 DIAGNOSIS — M546 Pain in thoracic spine: Secondary | ICD-10-CM

## 2017-08-12 DIAGNOSIS — M5410 Radiculopathy, site unspecified: Secondary | ICD-10-CM

## 2017-08-12 NOTE — Telephone Encounter (Signed)
Patient call to say his MD put his rehab on hold until after he get his MRI results back. He will call us next week to let us know if he will be returning.

## 2017-08-16 ENCOUNTER — Encounter (HOSPITAL_COMMUNITY): Payer: Self-pay | Admitting: Physical Therapy

## 2017-08-18 ENCOUNTER — Ambulatory Visit (HOSPITAL_COMMUNITY): Payer: PRIVATE HEALTH INSURANCE | Admitting: Physical Therapy

## 2017-08-18 ENCOUNTER — Telehealth (HOSPITAL_COMMUNITY): Payer: Self-pay | Admitting: Physical Therapy

## 2017-08-18 NOTE — Telephone Encounter (Signed)
Pt did not show for appt.  Called and left message regarding missed appt and reminded of next appt on Tuesday, 5/28 at [facility], PTA/CLT [phone]

## 2017-08-24 ENCOUNTER — Ambulatory Visit (HOSPITAL_COMMUNITY): Payer: PRIVATE HEALTH INSURANCE

## 2017-08-24 ENCOUNTER — Telehealth (HOSPITAL_COMMUNITY): Payer: Self-pay

## 2017-08-24 NOTE — Telephone Encounter (Signed)
No show, called and spoke to pt who stated Dr. Joan Flores cancelled PT until MRI on lower back.    [address], [place]; [facility] [phone]

## 2017-08-26 ENCOUNTER — Ambulatory Visit (HOSPITAL_COMMUNITY): Payer: Self-pay | Admitting: Physical Therapy

## 2017-09-22 ENCOUNTER — Encounter (HOSPITAL_COMMUNITY): Payer: Self-pay | Admitting: Physical Therapy

## 2017-09-22 NOTE — Therapy (Signed)
[facility], [name], [address] Phone: [phone]   Fax:  [phone]  Patient Details  Name: Zachary Nash MRN: [id number] Date of Birth: [birth year] Referring Provider:  No ref. provider found  Encounter Date: 09/22/2017   PHYSICAL THERAPY DISCHARGE SUMMARY  Visits from Start of Care: 21  Current functional level related to goals / functional outcomes: 5/6 STG has been achieved;  3/5 LTG have been achieved.    Remaining deficits: PT has not returned to work; walks with a cane   Education / Equipment: HEP  Plan: Patient agrees to discharge.  Patient goals were partially met. Patient is being discharged due to not returning since the last visit.  ?????       Rayetta Humphrey, PT CLT [phone] 09/22/2017, 11:54 AM  [place], [name], [address] Phone: [phone]   Fax:  [phone]

## 2017-09-23 ENCOUNTER — Other Ambulatory Visit (INDEPENDENT_AMBULATORY_CARE_PROVIDER_SITE_OTHER): Payer: Self-pay | Admitting: Internal Medicine

## 2017-10-06 ENCOUNTER — Ambulatory Visit (INDEPENDENT_AMBULATORY_CARE_PROVIDER_SITE_OTHER): Payer: PRIVATE HEALTH INSURANCE | Admitting: Internal Medicine

## 2017-10-06 ENCOUNTER — Encounter (INDEPENDENT_AMBULATORY_CARE_PROVIDER_SITE_OTHER): Payer: Self-pay | Admitting: Internal Medicine

## 2017-10-06 VITALS — BP 140/80 | HR 84 | Temp 97.9°F | Ht 71.0 in | Wt 196.9 lb

## 2017-10-06 DIAGNOSIS — R1319 Other dysphagia: Secondary | ICD-10-CM

## 2017-10-06 DIAGNOSIS — R1310 Dysphagia, unspecified: Secondary | ICD-10-CM | POA: Diagnosis not present

## 2017-10-06 DIAGNOSIS — K219 Gastro-esophageal reflux disease without esophagitis: Secondary | ICD-10-CM

## 2017-10-06 MED ORDER — PANTOPRAZOLE SODIUM 40 MG PO TBEC: 40.0000 mg | DELAYED_RELEASE_TABLET | Freq: Two times a day (BID) | ORAL | 3 refills | Status: DC

## 2017-10-06 NOTE — Patient Instructions (Addendum)
Protonix twice a day Stop the Pepcid DG esophagram.  Food Choices for Gastroesophageal Reflux Disease, Adult When you have gastroesophageal reflux disease (GERD), the foods you eat and your eating habits are very important. Choosing the right foods can help ease the discomfort of GERD. Consider working with a diet and nutrition specialist (dietitian) to help you make healthy food choices. What general guidelines should I follow? Eating plan  Choose healthy foods low in fat, such as fruits, vegetables, whole grains, low-fat dairy products, and lean meat, fish, and poultry.  Eat frequent, small meals instead of three large meals each day. Eat your meals slowly, in a relaxed setting. Avoid bending over or lying down until 2-3 hours after eating.  Limit high-fat foods such as fatty meats or fried foods.  Limit your intake of oils, butter, and shortening to less than 8 teaspoons each day.  Avoid the following: ? Foods that cause symptoms. These may be different for different people. Keep a food diary to keep track of foods that cause symptoms. ? Alcohol. ? Drinking large amounts of liquid with meals. ? Eating meals during the 2-3 hours before bed.  Cook foods using methods other than frying. This may include baking, grilling, or broiling. Lifestyle   Maintain a healthy weight. Ask your health care provider what weight is healthy for you. If you need to lose weight, work with your health care provider to do so safely.  Exercise for at least 30 minutes on 5 or more days each week, or as told by your health care provider.  Avoid wearing clothes that fit tightly around your waist and chest.  Do not use any products that contain nicotine or tobacco, such as cigarettes and e-cigarettes. If you need help quitting, ask your health care provider.  Sleep with the head of your bed raised. Use a wedge under the mattress or blocks under the bed frame to raise the head of the bed. What foods are not  recommended? The items listed may not be a complete list. Talk with your dietitian about what dietary choices are best for you. Grains Pastries or quick breads with added fat. [place] toast. Vegetables Deep fried vegetables. [place] fries. Any vegetables prepared with added fat. Any vegetables that cause symptoms. For some people this may include tomatoes and tomato products, chili peppers, onions and garlic, and horseradish. Fruits Any fruits prepared with added fat. Any fruits that cause symptoms. For some people this may include citrus fruits, such as oranges, grapefruit, pineapple, and lemons. Meats and other protein foods High-fat meats, such as fatty beef or pork, hot dogs, ribs, ham, sausage, salami and bacon. Fried meat or protein, including fried fish and fried chicken. Nuts and nut butters. Dairy Whole milk and chocolate milk. Sour cream. Cream. Ice cream. Cream cheese. Milk shakes. Beverages Coffee and tea, with or without caffeine. Carbonated beverages. Sodas. Energy drinks. Fruit juice made with acidic fruits (such as orange or grapefruit). Tomato juice. Alcoholic drinks. Fats and oils Butter. Margarine. Shortening. Ghee. Sweets and desserts Chocolate and cocoa. Donuts. Seasoning and other foods Pepper. Peppermint and spearmint. Any condiments, herbs, or seasonings that cause symptoms. For some people, this may include curry, hot sauce, or vinegar-based salad dressings. Summary  When you have gastroesophageal reflux disease (GERD), food and lifestyle choices are very important to help ease the discomfort of GERD.  Eat frequent, small meals instead of three large meals each day. Eat your meals slowly, in a relaxed setting. Avoid bending over or  lying down until 2-3 hours after eating.  Limit high-fat foods such as fatty meat or fried foods. This information is not intended to replace advice given to you by your health care provider. Make sure you discuss any questions you have  with your health care provider. Document Released: 03/16/2005 Document Revised: 03/17/2016 Document Reviewed: 03/17/2016 Elsevier Interactive Patient Education  [employer].

## 2017-10-06 NOTE — Progress Notes (Addendum)
Subjective:    Patient ID: Zachary Nash, male    DOB: 09-04-1970, 47 y.o.   MRN: [id number]  HPI  Here today for f/u. Last seen in April of 2019. Hx of dysphagia. Underwent an EGD 03/24/2017. Normal proximal esophagus and med esophagus. LA grade B reflux esophagitis. Benign appearing esophageal stenosis and GEJ. Dilated. Underwent an MRI 09/13/2017 Thoracic spine without contrast. Under additional comments: Hiatal hernia with diffusely patulous esophagus. He says today, sometimes he has burning in his esophagus, and sometimes foods feel like they are coming up. He is having trouble swallowing daily.  Appetite is okay for the most part.  Has a BM about once a week. No melena or BRRB.   Neck surgery February of this year, Dr. Jonni Sanger and [facility].  Review of Systems Past Medical History:  Diagnosis Date  . Anxiety   . Asthma   . Bilateral inguinal hernia 05/29/2016  . Complication of anesthesia    states he stopped breathing one time with anesthesia  . Constipation   . Fatty liver   . GERD (gastroesophageal reflux disease)   . History of kidney stones   . Pneumonia   . PONV (postoperative nausea and vomiting)   . Radicular pain of left lower extremity     Past Surgical History:  Procedure Laterality Date  . ANTERIOR CERVICAL DECOMP/DISCECTOMY FUSION N/A 05/26/2017   Procedure: ANTERIOR CERVICAL DECOMPRESSION/DISCECTOMY FUSION TWO LEVELS Cervical five-six, Cervical six-seven;  Surgeon: Phylliss Bob, MD;  Location: [facility];  Service: Orthopedics;  Laterality: N/A;  . CHOLECYSTECTOMY    . ESOPHAGEAL DILATION N/A 03/24/2017   Procedure: ESOPHAGEAL DILATION;  Surgeon: Rogene Houston, MD;  [facility];  Service: Endoscopy;  Laterality: N/A;  . ESOPHAGOGASTRODUODENOSCOPY N/A 03/24/2017   Procedure: ESOPHAGOGASTRODUODENOSCOPY (EGD);  Surgeon: Rogene Houston, MD;  [facility];  Service: Endoscopy;  Laterality: N/A;  1155  . INGUINAL HERNIA REPAIR  Bilateral 05/29/2016   Procedure: OPEN HERNIA REPAIR INGUINAL ADULT BILATERAL WITH insertion of MESH;  Surgeon: Fanny Skates, MD;  Location: [facility];  Service: General;  Laterality: Bilateral;  . Lipoma removed      Allergies  Allergen Reactions  . Penicillins Anaphylaxis and Swelling    Has patient had a PCN reaction causing immediate rash, facial/tongue/throat swelling, SOB or lightheadedness with hypotension: yes Has patient had a PCN reaction causing severe rash involving mucus membranes or skin necrosis: yes Has patient had a PCN reaction that required hospitalization: no Has patient had a PCN reaction occurring within the last 10 years: no If all of the above answers are "NO", then may proceed with Cephalosporin use.    Current Outpatient Medications on File Prior to Visit  Medication Sig Dispense Refill  . acetaminophen (TYLENOL) 325 MG tablet Take 2 tablets (650 mg total) by mouth every 6 (six) hours as needed for mild pain (or Fever >/= 101). 60 tablet 3  . albuterol (PROVENTIL HFA;VENTOLIN HFA) 108 (90 BASE) MCG/ACT inhaler Inhale 2 puffs into the lungs every 6 (six) hours as needed for wheezing.    . diazepam (VALIUM) 5 MG tablet Take 1 tablet (5 mg total) by mouth every 6 (six) hours as needed for muscle spasms. 30 tablet 0  . famotidine (PEPCID) 20 MG tablet Take 1 tablet (20 mg total) by mouth at bedtime.    . methocarbamol (ROBAXIN) 750 MG tablet Take 750 mg by mouth every morning.    . Multiple Vitamins-Minerals (ALIVE MENS ENERGY PO) Take  1 tablet by mouth daily.    . naproxen sodium (ALEVE) 220 MG tablet Take 220 mg by mouth.    . senna-docusate (SENOKOT-S) 8.6-50 MG tablet Take 1 tablet by mouth at bedtime as needed for mild constipation. Over the counter 30 tablet 0  . UNABLE TO FIND Outpatient physical therapy, occupational therapy   Diagnosis: Cervical myeloradiculopathy 1 Mutually Defined 0   No current facility-administered medications on file prior to visit.          Objective:   Physical Exam Blood pressure 140/80, pulse 84, temperature 97.9 F (36.6 C), height 5' 11"  (1.803 m), weight 196 lb 14.4 oz (89.3 kg). Alert and oriented. Skin warm and dry. Oral mucosa is moist.   . Sclera anicteric, conjunctivae is pink. Thyroid not enlarged. No cervical lymphadenopathy. Lungs clear. Heart regular rate and rhythm.  Abdomen is soft. Bowel sounds are positive. No hepatomegaly. No abdominal masses felt. No tenderness.  No edema to lower extremities.   Left foot in a boot brace (weakness since neck surgery).         Assessment & Plan:  Dysphagia. Will get a DG Esophagram. I will have Dr Laural Golden review CD from [facility]. GERD diet given to patient. Patient was reassured.

## 2017-10-12 ENCOUNTER — Ambulatory Visit (HOSPITAL_COMMUNITY): Payer: PRIVATE HEALTH INSURANCE

## 2017-10-15 ENCOUNTER — Ambulatory Visit (HOSPITAL_COMMUNITY)
Admission: RE | Admit: 2017-10-15 | Discharge: 2017-10-15 | Disposition: A | Discharge status: Home / Self Care | Payer: Self-pay | Source: Ambulatory Visit | Attending: Internal Medicine | Admitting: Internal Medicine

## 2017-10-15 DIAGNOSIS — K449 Diaphragmatic hernia without obstruction or gangrene: Secondary | ICD-10-CM | POA: Insufficient documentation

## 2017-10-15 DIAGNOSIS — R1319 Other dysphagia: Secondary | ICD-10-CM

## 2017-10-15 DIAGNOSIS — R1310 Dysphagia, unspecified: Secondary | ICD-10-CM | POA: Insufficient documentation

## 2017-10-19 ENCOUNTER — Telehealth (INDEPENDENT_AMBULATORY_CARE_PROVIDER_SITE_OTHER): Payer: Self-pay | Admitting: Internal Medicine

## 2017-10-19 NOTE — Telephone Encounter (Signed)
Patient would like you or Dr Laural Golden to call him back

## 2017-10-20 ENCOUNTER — Telehealth (INDEPENDENT_AMBULATORY_CARE_PROVIDER_SITE_OTHER): Payer: Self-pay | Admitting: Internal Medicine

## 2017-10-20 NOTE — Telephone Encounter (Signed)
Referral & notes faxed to [facility] and they will contact patient to schedule

## 2017-10-20 NOTE — Telephone Encounter (Signed)
I have talked with the patient.

## 2017-10-20 NOTE — Telephone Encounter (Signed)
Zachary Nash, Needs high resolution esophageal manometry.

## 2017-11-02 ENCOUNTER — Encounter (INDEPENDENT_AMBULATORY_CARE_PROVIDER_SITE_OTHER): Payer: Self-pay | Admitting: Internal Medicine

## 2017-11-02 ENCOUNTER — Ambulatory Visit (INDEPENDENT_AMBULATORY_CARE_PROVIDER_SITE_OTHER): Payer: Self-pay | Admitting: Internal Medicine

## 2017-11-02 ENCOUNTER — Ambulatory Visit (INDEPENDENT_AMBULATORY_CARE_PROVIDER_SITE_OTHER): Payer: PRIVATE HEALTH INSURANCE | Admitting: Internal Medicine

## 2017-11-02 VITALS — BP 150/90 | HR 60 | Temp 97.6°F | Ht 71.0 in | Wt 196.0 lb

## 2017-11-02 DIAGNOSIS — K219 Gastro-esophageal reflux disease without esophagitis: Secondary | ICD-10-CM

## 2017-11-02 NOTE — Progress Notes (Signed)
Subjective:    Patient ID: Zachary Nash, male    DOB: [birth year], 47 y.o.   MRN: [id number]  HPI Here today for f/u. Last seen 10/15/2017. Underwent an DG esophagram 10/15/2017 which revealed laryngeal penetration without aspiration.  Referred to [facility] for high resolution manometry. He apparently passed out during the procedure which was incomplete. Underwent a [place] lower left extremity which was negative for PE.  He became SOB while the probe was being placed. No chest pain. Procedure was stopped. He was sent to the ED.  Taking Protonix BID. States he is still have acid reflux. Appetite is okay.  He is scared to eat. Yesterday, he ate a few cut up hot dog weenies.  . Drinking fluids okay.  Has a BM every 3 days.   Last EGD was in 02/2017 which revealed   .   Normal proximal esophagus and med esophagus. LA grade B reflux esophagitis. Benign appearing esophageal stenosis and GEJ. Dilated.     August 2019 CT a chest with CM for possible PE 1. No evidence of thromboembolic disease involving the pulmonary arterial system to the level of the distal lobar/proximal segmental vessels. 2. Nonspecific scattered 1 to 3 mm nodules. There are scattered fissural lymph nodes. 3. Increased extrapleural fat seen throughout the course of the posterolateral aspect of the right hemithorax. This is a nonspecific finding and may be related to inflammation or infection. Similar focal regions in the left posterior hemithorax with associated scarring in the left lower lobe. 4. Hiatal hernia with patulous esophagus. Cannot exclude reflux disease.  Review of Systems Past Medical History:  Diagnosis Date  . Anxiety   . Asthma   . Bilateral inguinal hernia 05/29/2016  . Complication of anesthesia    states he stopped breathing one time with anesthesia  . Constipation   . Fatty liver   . GERD (gastroesophageal reflux disease)   . History of kidney stones   . Pneumonia   . PONV (postoperative nausea and vomiting)     . Radicular pain of left lower extremity     Past Surgical History:  Procedure Laterality Date  . ANTERIOR CERVICAL DECOMP/DISCECTOMY FUSION N/A 05/26/2017   Procedure: ANTERIOR CERVICAL DECOMPRESSION/DISCECTOMY FUSION TWO LEVELS Cervical five-six, Cervical six-seven;  Surgeon: Phylliss Bob, MD;  Location: [place];  Service: Orthopedics;  Laterality: N/A;  . CHOLECYSTECTOMY    . ESOPHAGEAL DILATION N/A 03/24/2017   Procedure: ESOPHAGEAL DILATION;  Surgeon: Rogene Houston, MD;  [facility];  Service: Endoscopy;  Laterality: N/A;  . ESOPHAGOGASTRODUODENOSCOPY N/A 03/24/2017   Procedure: ESOPHAGOGASTRODUODENOSCOPY (EGD);  Surgeon: Rogene Houston, MD;  [facility];  Service: Endoscopy;  Laterality: N/A;  1155  . INGUINAL HERNIA REPAIR Bilateral 05/29/2016   Procedure: OPEN HERNIA REPAIR INGUINAL ADULT BILATERAL WITH insertion of MESH;  Surgeon: Fanny Skates, MD;  Location: [place];  Service: General;  Laterality: Bilateral;  . Lipoma removed      Allergies  Allergen Reactions  . Penicillins Anaphylaxis and Swelling    Has patient had a PCN reaction causing immediate rash, facial/tongue/throat swelling, SOB or lightheadedness with hypotension: yes Has patient had a PCN reaction causing severe rash involving mucus membranes or skin necrosis: yes Has patient had a PCN reaction that required hospitalization: no Has patient had a PCN reaction occurring within the last 10 years: no If all of the above answers are "NO", then may proceed with Cephalosporin use.    Current Outpatient Medications on File Prior  to Visit  Medication Sig Dispense Refill  . acetaminophen (TYLENOL) 325 MG tablet Take 2 tablets (650 mg total) by mouth every 6 (six) hours as needed for mild pain (or Fever >/= 101). 60 tablet 3  . albuterol (PROVENTIL HFA;VENTOLIN HFA) 108 (90 BASE) MCG/ACT inhaler Inhale 2 puffs into the lungs every 6 (six) hours as needed for wheezing.    . diazepam (VALIUM) 5  MG tablet Take 1 tablet (5 mg total) by mouth every 6 (six) hours as needed for muscle spasms. 30 tablet 0  . methocarbamol (ROBAXIN) 750 MG tablet Take 750 mg by mouth every morning.    . Multiple Vitamins-Minerals (ALIVE MENS ENERGY PO) Take 1 tablet by mouth daily.    . naproxen sodium (ALEVE) 220 MG tablet Take 220 mg by mouth.    . pantoprazole (PROTONIX) 40 MG tablet Take 1 tablet (40 mg total) by mouth 2 (two) times daily before a meal. 180 tablet 3  . senna-docusate (SENOKOT-S) 8.6-50 MG tablet Take 1 tablet by mouth at bedtime as needed for mild constipation. Over the counter 30 tablet 0  . UNABLE TO FIND Outpatient physical therapy, occupational therapy   Diagnosis: Cervical myeloradiculopathy 1 Mutually Defined 0   No current facility-administered medications on file prior to visit.         Objective:   Physical Exam,Blood pressure (!) 150/90, pulse 60, temperature 97.6 F (36.4 C), height 5' 11"  (1.803 m), weight 196 lb (88.9 kg). Alert and oriented. Skin warm and dry. Oral mucosa is moist.   . Sclera anicteric, conjunctivae is pink. Thyroid not enlarged. No cervical lymphadenopathy. Lungs clear. Heart regular rate and rhythm.  Abdomen is soft. Bowel sounds are positive. No hepatomegaly. No abdominal masses felt. No tenderness.  No edema to lower extremities.           Assessment & Plan:  GERD. Continue the Protonix BID. Needs to elevated HOB. Will discuss with Dr Laural Golden.,

## 2017-11-02 NOTE — Patient Instructions (Signed)
HOB up. I will discuss with Dr. Laural Golden. Continue the Protonix BID.

## 2017-11-09 ENCOUNTER — Ambulatory Visit (INDEPENDENT_AMBULATORY_CARE_PROVIDER_SITE_OTHER): Payer: Self-pay | Admitting: Internal Medicine

## 2017-12-28 ENCOUNTER — Telehealth (INDEPENDENT_AMBULATORY_CARE_PROVIDER_SITE_OTHER): Payer: Self-pay | Admitting: *Deleted

## 2017-12-28 NOTE — Telephone Encounter (Signed)
[name] called the office wanting to hear from the Manometry results. States that he saw  Zachary Nash and was told that she was going to talk with Dr.Rehman and that Dr.Rehman was going to be calling him that week afterwards.  Upon review the patient Manometry was completed as he got sick and they took him to the ED for evaluation. Test aborted and there are no results for this test.  Dr.Rehman was made aware , he ask that the patient try the Diazepam 5 mg by mouth 30 minutes prior to Breakfast , Lunch and [employer] This is to see if this helps with the esophageal spasms that the patient is having.  Patient was called and given all the information and states that he will try this and will contact us in 1 - 2 weeks with a progress report.

## 2018-01-05 ENCOUNTER — Other Ambulatory Visit (HOSPITAL_BASED_OUTPATIENT_CLINIC_OR_DEPARTMENT_OTHER): Payer: Self-pay

## 2018-01-05 DIAGNOSIS — G4733 Obstructive sleep apnea (adult) (pediatric): Secondary | ICD-10-CM

## 2018-01-11 ENCOUNTER — Ambulatory Visit: Payer: PRIVATE HEALTH INSURANCE | Attending: Internal Medicine | Admitting: Neurology

## 2018-01-11 DIAGNOSIS — Z79899 Other long term (current) drug therapy: Secondary | ICD-10-CM | POA: Diagnosis not present

## 2018-01-11 DIAGNOSIS — G4733 Obstructive sleep apnea (adult) (pediatric): Secondary | ICD-10-CM

## 2018-01-12 NOTE — Procedures (Signed)
[place] A. [name], MD     [email or web address]             NOCTURNAL POLYSOMNOGRAPHY   LOCATION: [facility]   Patient Name: Zachary Nash, Zachary Nash Date: 01/11/2018 Gender: Male D.O.B: [birth year] Age (years): 46 Referring Provider: Delphina Cahill Height (inches): 71 Interpreting Physician: Phillips Odor MD, ABSM Weight (lbs): 196 RPSGT: Rosebud Poles BMI: 27 MRN: [id number] Neck Size: 19.00 CLINICAL INFORMATION Sleep Study Type: NPSG     Indication for sleep study: OSA     Epworth Sleepiness Score: 19     SLEEP STUDY TECHNIQUE As per the AASM Manual for the Scoring of Sleep and Associated Events v2.3 (April 2016) with a hypopnea requiring 4% desaturations.  The channels recorded and monitored were frontal, central and occipital EEG, electrooculogram (EOG), submentalis EMG (chin), nasal and oral airflow, thoracic and abdominal wall motion, anterior tibialis EMG, snore microphone, electrocardiogram, and pulse oximetry.  MEDICATIONS Medications self-administered by patient taken the night of the study : N/A  Current Outpatient Medications:  .  acetaminophen (TYLENOL) 325 MG tablet, Take 2 tablets (650 mg total) by mouth every 6 (six) hours as needed for mild pain (or Fever >/= 101)., Disp: 60 tablet, Rfl: 3 .  albuterol (PROVENTIL HFA;VENTOLIN HFA) 108 (90 BASE) MCG/ACT inhaler, Inhale 2 puffs into the lungs every 6 (six) hours as needed for wheezing., Disp: , Rfl:  .  diazepam (VALIUM) 5 MG tablet, Take 1 tablet (5 mg total) by mouth every 6 (six) hours as needed for muscle spasms., Disp: 30 tablet, Rfl: 0 .  methocarbamol (ROBAXIN) 750 MG tablet, Take 750 mg by mouth every morning., Disp: , Rfl:  .  Multiple Vitamins-Minerals (ALIVE MENS ENERGY PO), Take 1 tablet by mouth daily., Disp: , Rfl:  .  naproxen sodium (ALEVE) 220 MG tablet, Take 220 mg by mouth., Disp: , Rfl:  .  pantoprazole (PROTONIX) 40 MG tablet, Take 1 tablet (40 mg total) by mouth 2  (two) times daily before a meal., Disp: 180 tablet, Rfl: 3 .  senna-docusate (SENOKOT-S) 8.6-50 MG tablet, Take 1 tablet by mouth at bedtime as needed for mild constipation. Over the counter, Disp: 30 tablet, Rfl: 0 .  UNABLE TO FIND, Outpatient physical therapy, occupational therapy   Diagnosis: Cervical myeloradiculopathy, Disp: 1 Mutually Defined, Rfl: 0     SLEEP ARCHITECTURE The study was initiated at 10:00:42 PM and ended at 4:41:30 AM.  Sleep onset time was 109.3 minutes and the sleep efficiency was 38.9%%. The total sleep time was 156 minutes.  Stage REM latency was 242.0 minutes.  The patient spent 7.7%% of the night in stage N1 sleep, 50.3%% in stage N2 sleep, 28.2%% in stage N3 and 13.8% in REM.  Alpha intrusion was absent.  Supine sleep was 96.48%.  RESPIRATORY PARAMETERS The overall apnea/hypopnea index (AHI) was 6.2 per hour. There were 1 total apneas, including 1 obstructive, 0 central and 0 mixed apneas. There were 15 hypopneas and 0 RERAs.  The AHI during Stage REM sleep was 0.0 per hour.  AHI while supine was 6.4 per hour.  The mean oxygen saturation was 91.8%. The minimum SpO2 during sleep was 88.0%.  moderate snoring was noted during this study.  Prolonged oxygen desaturation with apniec/hypoaneic events are noted suggestive of hypoventilation. Total time less than 88% 7 minutes.   CARDIAC DATA The 2 lead EKG demonstrated sinus rhythm. The mean heart rate was N/A beats per minute. Other EKG findings include: None.  LEG MOVEMENT DATA The  total PLMS were 0 with a resulting PLMS index of 0.0. Associated arousal with leg movement index was 0.0.  IMPRESSIONS 1. 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 The severity does not require positive pressure treatment.  2. Mild hypoventilation syndrome is noted. Suggest formal overnight oximetry  Delano Metz, MD Diplomate, American Board of Sleep Medicine.  ELECTRONICALLY SIGNED ON:  01/12/2018, 10:27 AM [place] PH: [phone]   FX: [phone] [place]

## 2018-02-23 ENCOUNTER — Other Ambulatory Visit: Payer: Self-pay | Admitting: Gastroenterology

## 2018-03-03 ENCOUNTER — Ambulatory Visit
Admission: RE | Admit: 2018-03-03 | Discharge: 2018-03-03 | Disposition: A | Discharge status: Home / Self Care | Payer: No Typology Code available for payment source | Source: Ambulatory Visit | Attending: Gastroenterology | Admitting: Gastroenterology

## 2018-03-03 ENCOUNTER — Other Ambulatory Visit: Payer: Self-pay | Admitting: Gastroenterology

## 2018-03-03 DIAGNOSIS — R109 Unspecified abdominal pain: Secondary | ICD-10-CM

## 2018-03-03 DIAGNOSIS — K5909 Other constipation: Secondary | ICD-10-CM

## 2018-03-09 ENCOUNTER — Encounter: Payer: Self-pay | Admitting: Internal Medicine

## 2018-03-09 ENCOUNTER — Ambulatory Visit (INDEPENDENT_AMBULATORY_CARE_PROVIDER_SITE_OTHER): Payer: No Typology Code available for payment source | Admitting: Internal Medicine

## 2018-03-09 VITALS — BP 124/82 | HR 79 | Ht 70.0 in | Wt 200.4 lb

## 2018-03-09 DIAGNOSIS — G4734 Idiopathic sleep related nonobstructive alveolar hypoventilation: Secondary | ICD-10-CM | POA: Diagnosis not present

## 2018-03-09 DIAGNOSIS — R0609 Other forms of dyspnea: Secondary | ICD-10-CM | POA: Insufficient documentation

## 2018-03-09 LAB — BASIC METABOLIC PANEL
BUN: 23 mg/dL (ref 6–23)
CALCIUM: 9.6 mg/dL (ref 8.4–10.5)
CO2: 27 mEq/L (ref 19–32)
Chloride: 103 mEq/L (ref 96–112)
Creatinine, Ser: 1.35 mg/dL (ref 0.40–1.50)
GFR: 60.18 mL/min (ref >60.00)
Glucose, Bld: 105 mg/dL — ABNORMAL HIGH (ref 70–99)
Potassium: 3.9 mEq/L (ref 3.5–5.1)
Sodium: 138 mEq/L (ref 135–145)

## 2018-03-09 LAB — CBC WITH DIFFERENTIAL/PLATELET
Basophils Absolute: 0 10*3/uL (ref 0.0–0.1)
Basophils Relative: 0.7 % (ref 0.0–3.0)
Eosinophils Absolute: 0.4 10*3/uL (ref 0.0–0.7)
Eosinophils Relative: 7.2 % — ABNORMAL HIGH (ref 0.0–5.0)
HEMATOCRIT: 46.6 % (ref 39.0–52.0)
Hemoglobin: 15.8 g/dL (ref 13.0–17.0)
Lymphocytes Relative: 32.8 % (ref 12.0–46.0)
Lymphs Abs: 1.7 10*3/uL (ref 0.7–4.0)
MCHC: 34 g/dL (ref 30.0–36.0)
MCV: 86.3 fl (ref 78.0–100.0)
Monocytes Absolute: 0.5 10*3/uL (ref 0.1–1.0)
Monocytes Relative: 9.5 % (ref 3.0–12.0)
NEUTROS ABS: 2.6 10*3/uL (ref 1.4–7.7)
NEUTROS PCT: 49.8 % (ref 43.0–77.0)
Platelets: 234 10*3/uL (ref 150.0–400.0)
RBC: 5.4 Mil/uL (ref 4.22–5.81)
RDW: 13.1 % (ref 11.5–15.5)
WBC: 5.3 10*3/uL (ref 4.0–10.5)

## 2018-03-09 LAB — TSH: TSH: 1.84 u[IU]/mL (ref 0.35–4.50)

## 2018-03-09 MED ORDER — GABAPENTIN 100 MG PO CAPS: 100.0000 mg | ORAL_CAPSULE | Freq: Three times a day (TID) | ORAL | 2 refills | Status: DC

## 2018-03-09 NOTE — Progress Notes (Signed)
Zachary Nash, male    DOB: 1970/05/23,   MRN: [id number]   Brief patient profile: 14 yowm never smoker referred to pulmonary clinic 03/09/2018 by Dr  Delphina Cahill for unexplained sob rest and ex, assoc dry cough and throat tightness and dysphagia since onset March 2017 worse since neck surgery Feb 2019   Last EGD was in 02/2017 which revealed   .   Normal proximal esophagus and med esophagus. LA grade B reflux esophagitis. Benign appearing esophageal stenosis and GEJ. Dilated.      History of Present Illness  03/09/2018  Pulmonary/ 1st office eval/Jettson Crable  Chief Complaint  Patient presents with  . Pulmonary Consult    Referred by Dr. Meade Maw. Pt c/o DOE x 1 month. He gets SOB walking short distances such as from lobby to exam room. He also c/o occ CP. He has a non prod cough that occurs with exertion.    Dyspnea:  100 ft flat and slow, some better at rest and lying on side on nasal 02 just started 3 weeks prior to OV   Cough: 24/7/dry  Sleep: only on side/ bed horizontal SABA use: helps some   No obvious day to day or daytime variability or assoc excess/ purulent sputum or mucus plugs or hemoptysis or  chest tightness, subjective wheeze or overt sinus or hb symptoms.   Sleeping ok  without nocturnal  or early am exacerbation  of respiratory  c/o's or need for noct saba. Also denies any obvious fluctuation of symptoms with weather or environmental changes or other aggravating or alleviating factors except as outlined above   No unusual exposure hx or h/o childhood pna/ asthma or knowledge of premature birth.  Current Allergies, Complete Past Medical History, Past Surgical History, Family History, and Social History were reviewed in [employer] record.  ROS  The following are not active complaints unless bolded Hoarseness, sore throat, dysphagia= globus sensation, dental problems, itching, sneezing,  nasal congestion or discharge of excess mucus or purulent  secretions, ear ache,   fever, chills, sweats, unintended wt loss or wt gain, classically pleuritic or exertional cp,  orthopnea pnd or arm/hand swelling  or leg swelling, presyncope, palpitations, abdominal pain, anorexia, nausea, vomiting, diarrhea  or change in bowel habits or change in bladder habits, change in stools or change in urine, dysuria, hematuria,  rash, arthralgias, visual complaints, headache, numbness, weakness or ataxia or problems with walking or coordination,  change in mood or  memory.           Past Medical History:  Diagnosis Date  . Anxiety   . Asthma   . Bilateral inguinal hernia 05/29/2016  . Complication of anesthesia    states he stopped breathing one time with anesthesia  . Constipation   . Fatty liver   . GERD (gastroesophageal reflux disease)   . History of kidney stones   . Pneumonia   . PONV (postoperative nausea and vomiting)   . Radicular pain of left lower extremity     Outpatient Medications Prior to Visit  Medication Sig Dispense Refill  . acetaminophen (TYLENOL) 325 MG tablet Take 2 tablets (650 mg total) by mouth every 6 (six) hours as needed for mild pain (or Fever >/= 101). 60 tablet 3  . albuterol (PROVENTIL HFA;VENTOLIN HFA) 108 (90 BASE) MCG/ACT inhaler Inhale 2 puffs into the lungs every 6 (six) hours as needed for wheezing.    . baclofen (LIORESAL) 10 MG tablet Take 1 tablet by  mouth daily as needed.    . diazepam (VALIUM) 5 MG tablet Take 1 tablet (5 mg total) by mouth every 6 (six) hours as needed for muscle spasms. 30 tablet 0  . DULoxetine (CYMBALTA) 20 MG capsule Take 20 mg by mouth daily.  2  . methocarbamol (ROBAXIN) 750 MG tablet Take 750 mg by mouth every morning.    . Multiple Vitamins-Minerals (ALIVE MENS ENERGY PO) Take 1 tablet by mouth daily.    . naproxen sodium (ALEVE) 220 MG tablet Take 220 mg by mouth.    . OXYGEN 2lpm with sleep only  [employer]    . pantoprazole (PROTONIX) 40 MG tablet Take 1 tablet (40 mg  total) by mouth qam ac  180 tablet 3  . senna-docusate (SENOKOT-S) 8.6-50 MG tablet Take 1 tablet by mouth at bedtime as needed for mild constipation. Over the counter 30 tablet 0     Objective:     BP 124/82 (BP Location: Left Arm, Cuff Size: Normal)   Pulse 79   Ht 5' 10"  (1.778 m)   Wt 200 lb 6.4 oz (90.9 kg)   SpO2 100%   BMI 28.75 kg/m   SpO2: 100 %  RA  amb wm jumping from symptom to symptom before finishing answering the question re the previous symptom with spont harsh upper airway dry coughing pattern worse with voice use   HEENT: nl dentition, turbinates bilaterally, and oropharynx. Nl external ear canals without cough reflex   NECK :  without JVD/Nodes/TM/ nl carotid upstrokes bilaterally   LUNGS: no acc muscle use,  Nl contour chest which is clear to A and P bilaterally without cough on insp or exp maneuvers   CV:  RRR  no s3 or murmur or increase in P2, and no edema   ABD:  soft and nontender with nl inspiratory excursion in the supine position. No bruits or organomegaly appreciated, bowel sounds nl  MS:  Nl gait/ ext warm without deformities, calf tenderness, cyanosis or clubbing Brace R leg   SKIN: warm and dry without lesions    NEURO:  alert, approp, nl sensorium with  no motor or cerebellar deficits apparent.     . I personally reviewed images and agree with radiology impression as follows:   Chest CTa   10/28/17  1. No evidence of thromboembolic disease involving the pulmonary arterial system to the level of the distal lobar/proximal segmental vessels. 2. Nonspecific scattered 1 to 3 mm nodules. There are scattered fissural lymph nodes.   Labs ordered/ reviewed:      Chemistry      Component Value Date/Time   NA 138 03/09/2018 1021   K 3.9 03/09/2018 1021   CL 103 03/09/2018 1021   CO2 27 03/09/2018 1021   BUN 23 03/09/2018 1021   CREATININE 1.35 03/09/2018 1021      Component Value Date/Time   CALCIUM 9.6 03/09/2018 1021   ALKPHOS 65  05/24/2017 1740   AST 33 05/24/2017 1740   ALT 39 05/24/2017 1740   BILITOT 0.6 05/24/2017 1740        Lab Results  Component Value Date   WBC 5.3 03/09/2018   HGB 15.8 03/09/2018   HCT 46.6 03/09/2018   MCV 86.3 03/09/2018   PLT 234.0 03/09/2018       EOS  0.4                                    03/09/2018     Lab Results  Component Value Date   TSH 1.84 03/09/2018            Assessment   Nocturnal hypoxemia Sleep study 1015/19 no sign OSA/ noct hypoxemia > placed on 2lpm hs    Not clear at all why he would desaturate in the absence of sleep apnea as he does not have any evidence of lung disease but I suspect he had hypoventilation related to medications which need to be minimized over the long-term.    Rec:  Full pfts/ wear 02 2lpm hs for now   F/u p pfts and methacholine challenge test are available.       DOE (dyspnea on exertion) Onset March 2017  -  03/09/2018   Walked RA x one lap = 210 ft - stopped due to  Sob, slow pace with limp, no desat - Spirometry 03/09/2018  FEV1 3.1 (77%)  Ratio 79 with classic vcd f/v loop  - MCT 03/09/2018    Symptoms are markedly disproportionate to objective findings and not clear to what extent this is actually a pulmonary  problem but pt does appear to have difficult to sort out respiratory symptoms of unknown origin for which  DDX  = almost all start with A and  include Adherence, Ace Inhibitors, Acid Reflux, Active Sinus Disease, Alpha 1 Antitripsin deficiency, Anxiety masquerading as Airways dz,  ABPA,  Allergy(esp in young), Aspiration (esp in elderly), Adverse effects of meds,  Active smoking or Vaping, A bunch of PE's/clot burden (a few small clots can't cause this syndrome unless there is already severe underlying pulm or vascular dz with poor reserve),  Anemia or thyroid disorder, plus two Bs  = Bronchiectasis and Beta blocker use..and one C= CHF      Adherence is always the initial "prime suspect" and is a multilayered concern that requires a "trust but verify" approach in every patient - starting with knowing how to use medications, especially inhalers, correctly, keeping up with refills and understanding the fundamental difference between maintenance and prns vs those medications only taken for a very short course and then stopped and not refilled.   ? Acid (or non-acid) GERD > always difficult to exclude as up to 75% of pts in some series report no assoc GI/ Heartburn symptoms and he does have EGD evidence of esophagitis based on previous study> rec continue  max (24h)  acid suppression and diet restrictions/ reviewed and instructions given in writing. - Of the three most common causes of  Sub-acute / recurrent or chronic cough, only one (GERD)  can actually contribute to/ trigger  the other two (asthma and post nasal drip syndrome)  and perpetuate the cylce of cough.  While not intuitively obvious, many patients with chronic low grade reflux do not cough until there is a primary insult that disturbs the protective epithelial barrier and exposes sensitive nerve endings.   This is typically viral but can due to PNDS and  either may apply here.   The point is that once this occurs, it is difficult to eliminate the cycle  using anything but a maximally effective acid suppression regimen at least in the short run, accompanied by an appropriate diet to address non acid GERD and control / eliminate the cough  itself with trial of gabapentin 100 mg tid based on f/v curve today which is typical of vcd, not asthma  ? Allergy/asthma > send profile, proceed with MCT    ? Anxiety > usually at the bottom of this list of usual suspects but should be much higher on this pt's based on H and P and note already on psychotropics and may interfere with adherence and also interpretation of response or lack thereof to symptom management which can be quite subjective.    ? A bunch of PE's > neg CTa noted  ? Anemia/ thyroid dz > ruled out   ? CHF > cards w/u neg to date     Total time devoted to counseling  > 50 % of initial 60 min office visit:  review case with pt/ directly observing ambulatory 02 study/ discussion of options/alternatives/ personally creating written customized instructions  in presence of pt  then going over those specific  Instructions directly with the pt including how to use all of the meds but in particular covering each new medication in detail and the difference between the maintenance= "automatic" meds and the prns using an action plan format for the latter (If this problem/symptom => do that organization reading Left to right).  Please see AVS from this visit for a full list of these instructions which I personally wrote for this pt and  are unique to this visit.         Christinia Gully, MD 03/09/2018

## 2018-03-09 NOTE — Assessment & Plan Note (Addendum)
Sleep study 1015/19 no sign OSA/ noct hypoxemia > placed on 2lpm hs    Not clear at all why he would desaturate in the absence of sleep apnea as he does not have any evidence of lung disease but I suspect he had hypoventilation related to medications which need to be minimized over the long-term.    Rec:  Full pfts/ wear 02 2lpm hs for now   F/u p pfts and methacholine challenge test are available.    Total time devoted to counseling  > 50 % of initial 60 min office visit:  review case with pt/ directly observing ambulatory 02 study/ discussion of options/alternatives/ personally creating written customized instructions  in presence of pt  then going over those specific  Instructions directly with the pt including how to use all of the meds but in particular covering each new medication in detail and the difference between the maintenance= "automatic" meds and the prns using an action plan format for the latter (If this problem/symptom => do that organization reading Left to right).  Please see AVS from this visit for a full list of these instructions which I personally wrote for this pt and  are unique to this visit.

## 2018-03-09 NOTE — Patient Instructions (Addendum)
GERD (REFLUX)  is an extremely common cause of respiratory symptoms just like yours , many times with no obvious heartburn at all.    It can be treated with medication, but also with lifestyle changes including elevation of the head of your bed (ideally with 6-8 inch  bed blocks),  Smoking cessation, avoidance of late meals, excessive alcohol, and avoid fatty foods, chocolate, peppermint, colas, red wine, and acidic juices such as orange juice.  NO MINT OR MENTHOL PRODUCTS SO NO COUGH DROPS   USE SUGARLESS CANDY INSTEAD (Jolley ranchers or Stover's or Life Savers) or even ice chips will also do - the key is to swallow to prevent all throat clearing. NO OIL BASED VITAMINS - use powdered substitutes.    Continue protonix Take 30-60 min before first meal of the day including the day of your PFTs  Start gabapentin 100 mg three times a day   Please see patient coordinator before you leave today  to schedule PFT's = asthma challenge testing  Please remember to go to the lab department   for your tests - we will call you with the results when they are available.  Will arrange follow up when work up is complete

## 2018-03-10 ENCOUNTER — Encounter: Payer: Self-pay | Admitting: Internal Medicine

## 2018-03-10 LAB — RESPIRATORY ALLERGY PROFILE REGION II ~~LOC~~
ALLERGEN, OAK, T7: 0.1 kU/L — AB
Allergen, A. alternata, m6: <0.1 kU/L
Allergen, Comm Silver Birch, t9: <0.1 kU/L
Allergen, Cottonwood, t14: <0.1 kU/L
Allergen, D pternoyssinus,d7: 0.25 kU/L — ABNORMAL HIGH
Allergen, Mouse Urine Protein, e78: <0.1 kU/L
Allergen, Mulberry, t76: <0.1 kU/L
Allergen, P. notatum, m1: <0.1 kU/L
Aspergillus fumigatus, m3: <0.1 kU/L
Bermuda Grass: <0.1 kU/L
Box Elder IgE: <0.1 kU/L
CLADOSPORIUM HERBARUM (M2) IGE: <0.1 kU/L
CLASS: 0
CLASS: 0
CLASS: 0
COMMON RAGWEED (SHORT) (W1) IGE: <0.1 kU/L
Cat Dander: <0.1 kU/L
Class: 0
Class: 0
Class: 0
Class: 0
Class: 0
Class: 0
Class: 0
Class: 0
Class: 0
Class: 0
Class: 0
Class: 0
Class: 0
Class: 0
Class: 0
Class: 0
Class: 0
Class: 0
Class: 0
Class: 0
Class: 1
Cockroach: 0.64 kU/L — ABNORMAL HIGH
D. farinae: 0.18 kU/L — ABNORMAL HIGH
IgE (Immunoglobulin E), Serum: 117 kU/L — ABNORMAL HIGH (ref <=114)
Johnson Grass: <0.1 kU/L
Pecan/Hickory Tree IgE: <0.1 kU/L
Rough Pigweed  IgE: <0.1 kU/L
Sheep Sorrel IgE: <0.1 kU/L
Timothy Grass: <0.1 kU/L

## 2018-03-10 LAB — INTERPRETATION:

## 2018-03-10 NOTE — Assessment & Plan Note (Signed)
Onset March 2017  -  03/09/2018   Walked RA x one lap = 210 ft - stopped due to  Sob, slow pace with limp, no desat - Spirometry 03/09/2018  FEV1 3.1 (77%)  Ratio 79 with classic vcd f/v loop  - MCT 03/09/2018    Symptoms are markedly disproportionate to objective findings and not clear to what extent this is actually a pulmonary  problem but pt does appear to have difficult to sort out respiratory symptoms of unknown origin for which  DDX  = almost all start with A and  include Adherence, Ace Inhibitors, Acid Reflux, Active Sinus Disease, Alpha 1 Antitripsin deficiency, Anxiety masquerading as Airways dz,  ABPA,  Allergy(esp in young), Aspiration (esp in elderly), Adverse effects of meds,  Active smoking or Vaping, A bunch of PE's/clot burden (a few small clots can't cause this syndrome unless there is already severe underlying pulm or vascular dz with poor reserve),  Anemia or thyroid disorder, plus two Bs  = Bronchiectasis and Beta blocker use..and one C= CHF     Adherence is always the initial "prime suspect" and is a multilayered concern that requires a "trust but verify" approach in every patient - starting with knowing how to use medications, especially inhalers, correctly, keeping up with refills and understanding the fundamental difference between maintenance and prns vs those medications only taken for a very short course and then stopped and not refilled.   ? Acid (or non-acid) GERD > always difficult to exclude as up to 75% of pts in some series report no assoc GI/ Heartburn symptoms and he does have EGD evidence of esophagitis based on previous study> rec continue  max (24h)  acid suppression and diet restrictions/ reviewed and instructions given in writing. - Of the three most common causes of  Sub-acute / recurrent or chronic cough, only one (GERD)  can actually contribute to/ trigger  the other two (asthma and post nasal drip syndrome)  and perpetuate the cylce of cough.  While not  intuitively obvious, many patients with chronic low grade reflux do not cough until there is a primary insult that disturbs the protective epithelial barrier and exposes sensitive nerve endings.   This is typically viral but can due to PNDS and  either may apply here.   The point is that once this occurs, it is difficult to eliminate the cycle  using anything but a maximally effective acid suppression regimen at least in the short run, accompanied by an appropriate diet to address non acid GERD and control / eliminate the cough itself with trial of gabapentin 100 mg tid based on f/v curve today which is typical of vcd, not asthma  ? Allergy/asthma > send profile, proceed with MCT    ? Anxiety > usually at the bottom of this list of usual suspects but should be much higher on this pt's based on H and P and note already on psychotropics and may interfere with adherence and also interpretation of response or lack thereof to symptom management which can be quite subjective.   ? A bunch of PE's > neg CTa noted  ? Anemia/ thyroid dz > ruled out   ? CHF > cards w/u neg to date

## 2018-03-11 ENCOUNTER — Telehealth: Payer: Self-pay | Admitting: Internal Medicine

## 2018-03-11 NOTE — Telephone Encounter (Signed)
Called and spoke with patient he is aware of results and verbalized understanding. Nothing further needed.

## 2018-03-11 NOTE — Progress Notes (Signed)
LMTCB

## 2018-03-16 ENCOUNTER — Ambulatory Visit (HOSPITAL_COMMUNITY)
Admission: RE | Admit: 2018-03-16 | Discharge: 2018-03-16 | Disposition: A | Discharge status: Home / Self Care | Payer: No Typology Code available for payment source | Source: Ambulatory Visit | Attending: Internal Medicine | Admitting: Internal Medicine

## 2018-03-16 DIAGNOSIS — R0609 Other forms of dyspnea: Secondary | ICD-10-CM | POA: Insufficient documentation

## 2018-03-16 LAB — PULMONARY FUNCTION TEST
FEF 25-75 PRE: 5.12 L/s
FEF 25-75 Post: 4.58 L/sec
FEF2575-%Change-Post: -10 %
FEF2575-%Pred-Post: 124 %
FEF2575-%Pred-Pre: 139 %
FEV1-%Change-Post: 0 %
FEV1-%PRED-POST: 84 %
FEV1-%Pred-Pre: 85 %
FEV1-Post: 3.45 L
FEV1-Pre: 3.47 L
FEV1FVC-%Change-Post: -2 %
FEV1FVC-%Pred-Pre: 110 %
FEV6-%Change-Post: 1 %
FEV6-%Pred-Post: 80 %
FEV6-%Pred-Pre: 79 %
FEV6-POST: 4.05 L
FEV6-Pre: 4 L
FEV6FVC-%Change-Post: 0 %
FEV6FVC-%Pred-Post: 103 %
FEV6FVC-%Pred-Pre: 103 %
FVC-%Change-Post: 1 %
FVC-%Pred-Post: 77 %
FVC-%Pred-Pre: 76 %
FVC-Post: 4.06 L
FVC-Pre: 4 L
Post FEV1/FVC ratio: 85 %
Post FEV6/FVC ratio: 100 %
Pre FEV1/FVC ratio: 87 %
Pre FEV6/FVC Ratio: 100 %

## 2018-03-16 MED ORDER — METHACHOLINE 0.25 MG/ML NEB SOLN
2.0000 mL | Freq: Once | RESPIRATORY_TRACT | Status: AC
  Administered 2018-03-16: 0.5 mg via RESPIRATORY_TRACT
  Filled 2018-03-16: qty 2

## 2018-03-16 MED ORDER — METHACHOLINE 16 MG/ML NEB SOLN
2.0000 mL | Freq: Once | RESPIRATORY_TRACT | Status: AC
  Administered 2018-03-16: 32 mg via RESPIRATORY_TRACT
  Filled 2018-03-16: qty 2

## 2018-03-16 MED ORDER — ALBUTEROL SULFATE (2.5 MG/3ML) 0.083% IN NEBU
2.5000 mg | INHALATION_SOLUTION | Freq: Once | RESPIRATORY_TRACT | Status: AC
  Administered 2018-03-16: 2.5 mg via RESPIRATORY_TRACT

## 2018-03-16 MED ORDER — METHACHOLINE 0.0625 MG/ML NEB SOLN
2.0000 mL | Freq: Once | RESPIRATORY_TRACT | Status: AC
  Administered 2018-03-16: 0.125 mg via RESPIRATORY_TRACT
  Filled 2018-03-16: qty 2

## 2018-03-16 MED ORDER — METHACHOLINE 1 MG/ML NEB SOLN
2.0000 mL | Freq: Once | RESPIRATORY_TRACT | Status: AC
  Administered 2018-03-16: 2 mg via RESPIRATORY_TRACT
  Filled 2018-03-16: qty 2

## 2018-03-16 MED ORDER — METHACHOLINE 4 MG/ML NEB SOLN
2.0000 mL | Freq: Once | RESPIRATORY_TRACT | Status: AC
  Administered 2018-03-16: 8 mg via RESPIRATORY_TRACT
  Filled 2018-03-16: qty 2

## 2018-03-16 MED ORDER — SODIUM CHLORIDE 0.9 % IN NEBU
3.0000 mL | INHALATION_SOLUTION | Freq: Once | RESPIRATORY_TRACT | Status: AC
  Administered 2018-03-16: 3 mL via RESPIRATORY_TRACT
  Filled 2018-03-16: qty 3

## 2018-03-16 MED ORDER — ALBUTEROL SULFATE (2.5 MG/3ML) 0.083% IN NEBU: 2.5000 mg | INHALATION_SOLUTION | Freq: Once | RESPIRATORY_TRACT | Status: DC

## 2018-03-18 ENCOUNTER — Telehealth: Payer: Self-pay | Admitting: Internal Medicine

## 2018-03-18 NOTE — Telephone Encounter (Signed)
Neg for asthma so needs to try gabapentin increase to 100 mg x 2  Three times daily x weekend and come to office first of week with all meds in hand to regroup

## 2018-03-18 NOTE — Telephone Encounter (Signed)
Call made to patient, informed of MW recommendations: Neg for asthma so needs to try gabapentin increase to 100 mg x 2  Three times daily x weekend and come to office first of week with all meds in hand to regroup.   Voiced understanding. Patient states he has enough gabapentin to increase his 184m capsules to two capsules TID. Patient declined appointment next, appointment made 12.30.19 @ 9:00am. Nothing further is needed at this time.

## 2018-03-18 NOTE — Telephone Encounter (Signed)
Called and spoke with patient, he is requesting results from the PFT. Patient also stated that since he has had the test done he has been coughing more.   MW please advise, thank you.

## 2018-03-28 ENCOUNTER — Ambulatory Visit (INDEPENDENT_AMBULATORY_CARE_PROVIDER_SITE_OTHER): Payer: No Typology Code available for payment source | Admitting: Internal Medicine

## 2018-03-28 ENCOUNTER — Encounter: Payer: Self-pay | Admitting: Internal Medicine

## 2018-03-28 VITALS — BP 104/60 | HR 91 | Ht 70.0 in | Wt 206.0 lb

## 2018-03-28 DIAGNOSIS — R058 Other specified cough: Secondary | ICD-10-CM

## 2018-03-28 DIAGNOSIS — R05 Cough: Secondary | ICD-10-CM

## 2018-03-28 DIAGNOSIS — J45991 Cough variant asthma: Secondary | ICD-10-CM | POA: Diagnosis not present

## 2018-03-28 DIAGNOSIS — R0609 Other forms of dyspnea: Secondary | ICD-10-CM

## 2018-03-28 LAB — NITRIC OXIDE: Nitric Oxide: 22

## 2018-03-28 MED ORDER — BUDESONIDE-FORMOTEROL FUMARATE 80-4.5 MCG/ACT IN AERO: 2.0000 | INHALATION_SPRAY | Freq: Two times a day (BID) | RESPIRATORY_TRACT | 0 refills | Status: DC

## 2018-03-28 MED ORDER — BUDESONIDE-FORMOTEROL FUMARATE 80-4.5 MCG/ACT IN AERO: 2.0000 | INHALATION_SPRAY | Freq: Two times a day (BID) | RESPIRATORY_TRACT | 11 refills | Status: DC

## 2018-03-28 NOTE — Patient Instructions (Addendum)
Plan A = Automatic = Symbicort 80 1-2 puffs  every 12 hours    Work on inhaler technique:  relax and gently blow all the way out then take a nice smooth deep breath back in, triggering the inhaler at same time you start breathing in.  Hold for up to 5 seconds if you can. Blow out thru nose. Rinse and gargle with water when done   Plan B = Backup Only use your albuterol inhaler as a rescue medication to be used if you can't catch your breath by resting or doing a relaxed purse lip breathing pattern.  - The less you use it, the better it will work when you need it. - Ok to use the inhaler up to 2 puffs  every 4 hours if you must but call for appointment if use goes up over your usual need - Don't leave home without it !!  (think of it like the spare tire for your car)    Please see patient coordinator before you leave today  to schedule echocardiogram asap    Please schedule a follow up office visit in 4 weeks, sooner if needed  with all medications /inhalers/ solutions in hand so we can verify exactly what you are taking. This includes all medications from all doctors and over the counters

## 2018-03-28 NOTE — Progress Notes (Signed)
Zachary Nash, male    DOB: 09-25-1970,   MRN: [id number]      Brief patient profile: 85 yowm never smoker referred to pulmonary clinic 03/09/2018 by Dr  Delphina Cahill for unexplained sob rest and ex, assoc dry cough and throat tightness and dysphagia since onset March 2017 worse since neck surgery Feb 2019    Last EGD  Raman was in 02/2017 which revealed  Normal proximal esophagus and med esophagus. LA grade B reflux esophagitis. Benign appearing esophageal stenosis and GEJ. Dilated.      History of Present Illness  03/09/2018  Pulmonary/ 1st office eval/Solace Wendorff  Chief Complaint  Patient presents with  . Pulmonary Consult    Referred by Dr. Meade Maw. Pt c/o DOE x 1 month. He gets SOB walking short distances such as from lobby to exam room. He also c/o occ CP. He has a non prod cough that occurs with exertion.   Dyspnea:  100 ft flat and slow, some better at rest and lying on L side on nasal 02 just started 3 weeks prior to OV   Cough: 24/7/dry  Sleep: only on side/ bed horizontal SABA use: helps some  rec GERD diet Continue protonix 40 mg  Take 30-60 min before first meal of the day including the day of your PFTs Start gabapentin 100 mg three times a day    03/28/2018  f/u ov/Gustavus Haskin re:  prob uacs Chief Complaint  Patient presents with  . Follow-up    Breathing is unchanged. He has been wheezing more over the past 1-2 wks. He also c/o non prod cough and chest tightness. He is using his albuterol inhaler 1-2 x per day on average.  Dyspnea:  170f even slow pace Cough: dry assoc hoarseness/ seems worse 11 pm goes to bed at 1130  Sleeping: some bed blocks, still wakes up 2-3 x with cough/ sob  SABA use: during the day usually twice daily, seems to help 02: 2lpm hs only  Has not worked since Neck surgery 04/2017   No obvious day to day or daytime variability or assoc excess/ purulent sputum or mucus plugs or hemoptysis or cp or chest tightness, subjective wheeze or overt sinus or hb  symptoms.    . Also denies any obvious fluctuation of symptoms with weather or environmental changes or other aggravating or alleviating factors except as outlined above   No unusual exposure hx or h/o childhood pna/ asthma or knowledge of premature birth.  Current Allergies, Complete Past Medical History, Past Surgical History, Family History, and Social History were reviewed in [employer].  ROS  The following are not active complaints unless bolded Hoarseness, sore throat, dysphagia, dental problems, itching, sneezing,  nasal congestion or discharge of excess mucus or purulent secretions, ear ache,   fever, chills, sweats, unintended wt loss or wt gain, classically pleuritic or exertional cp,  orthopnea pnd or arm/hand swelling  or leg swelling, presyncope, palpitations, abdominal pain, anorexia, nausea, vomiting, diarrhea  or change in bowel habits or change in bladder habits, change in stools or change in urine, dysuria, hematuria,  rash, arthralgias, visual complaints, headache, numbness, weakness or ataxia or problems with walking or coordination,  change in mood or  memory.        No outpatient medications have been marked as taking for the 03/28/18 encounter (Office Visit) with WTanda Rockers MD.             Objective:     amb  wm nad / brace R leg  Wt Readings from Last 3 Encounters:  03/28/18 206 lb (93.4 kg)  03/09/18 200 lb 6.4 oz (90.9 kg)  11/02/17 196 lb (88.9 kg)     Vital signs reviewed - Note on arrival 02 sats  99% on RA         HEENT: nl dentition, turbinates bilaterally, and oropharynx. Nl external ear canals without cough reflex   NECK :  without JVD/Nodes/TM/ nl carotid upstrokes bilaterally   LUNGS: no acc muscle use,  Nl contour chest which is clear to A and P bilaterally without cough on insp or exp maneuvers   CV:  RRR  no s3 or murmur or increase in P2, and no edema   ABD:  soft and nontender with nl inspiratory  excursion in the supine position. No bruits or organomegaly appreciated, bowel sounds nl  MS:  Walks with limp/ brace R leg  ext warm without deformities, calf tenderness, cyanosis or clubbing No obvious joint restrictions   SKIN: warm and dry without lesions    NEURO:  alert, approp, nl sensorium with  no motor or cerebellar deficits apparent.        I personally reviewed images and agree with radiology impression as follows:  CXR:    03/29/2018  No active cardiopulmonary disease.   Echo 03/28/2018  wnl           Assessment

## 2018-03-29 ENCOUNTER — Ambulatory Visit (HOSPITAL_COMMUNITY)
Admission: RE | Admit: 2018-03-29 | Discharge: 2018-03-29 | Disposition: A | Discharge status: Home / Self Care | Payer: No Typology Code available for payment source | Source: Ambulatory Visit | Attending: Internal Medicine | Admitting: Internal Medicine

## 2018-03-29 ENCOUNTER — Telehealth: Payer: Self-pay | Admitting: Internal Medicine

## 2018-03-29 DIAGNOSIS — R0609 Other forms of dyspnea: Principal | ICD-10-CM

## 2018-03-29 NOTE — Telephone Encounter (Signed)
Called and spoke with patient, he is aware and verbalized understanding. Order has been placed for [facility]. Nothing further needed.

## 2018-03-29 NOTE — Telephone Encounter (Signed)
Fine with me dx doe

## 2018-03-29 NOTE — Telephone Encounter (Signed)
Called and spoke with patient, he stated that he still has some rattling in his chest and he wanted to know if he can have a chest xray done. MW please advise, thank you.

## 2018-03-29 NOTE — Telephone Encounter (Signed)
Pt is calling back about a Chest X-ray [phone]

## 2018-03-29 NOTE — Telephone Encounter (Signed)
Called the number provided but was put on hold for over 6 minutes. Will attempt to call back later.

## 2018-03-29 NOTE — Progress Notes (Signed)
*  PRELIMINARY RESULTS* Echocardiogram 2D echo has been performed.  Leavy Cella 03/29/2018, 9:16 AM

## 2018-03-31 ENCOUNTER — Encounter: Payer: Self-pay | Admitting: Internal Medicine

## 2018-03-31 ENCOUNTER — Telehealth: Payer: Self-pay | Admitting: Internal Medicine

## 2018-03-31 DIAGNOSIS — R058 Other specified cough: Secondary | ICD-10-CM | POA: Insufficient documentation

## 2018-03-31 DIAGNOSIS — R05 Cough: Secondary | ICD-10-CM | POA: Insufficient documentation

## 2018-03-31 MED ORDER — AZITHROMYCIN 250 MG PO TABS: ORAL_TABLET | ORAL | 0 refills | Status: DC

## 2018-03-31 NOTE — Telephone Encounter (Signed)
Called and spoke with patient, he stated that he is having trouble taking deep breaths in, patient is also coughing up thick green mucus. Patient complains of headache. Denies fever, no chills or body aches. Patient is wanting to know what he can do for this. Patient stated that he is still taking the delsym and the mucinex. MW please advise, thank you.

## 2018-03-31 NOTE — Telephone Encounter (Signed)
z-pak 

## 2018-03-31 NOTE — Telephone Encounter (Signed)
Called and spoke with Patient.  Dr. Melvyn Novas recommendations given.  Understanding stated. Patient requesting zpak to be sent to [facility].  Prescription sent. Nothing further at this time.

## 2018-03-31 NOTE — Assessment & Plan Note (Addendum)
Onset March 2017  -  03/09/2018   Walked RA x one lap = 210 ft - stopped due to  Sob, slow pace with limp, no desat - Spirometry 03/09/2018  FEV1 3.1 (77%)  Ratio 79 with classic vcd f/v loop  - Allergy profile 03/09/18  >  Eos 0.4 /  IgE  117 RAST pos dust, oak trees, roaches - MCT  03/16/18 neg  - 03/28/2018   Walked RA  2 laps @ 237f each @ avg pace with limp  stopped due to  Sob with sats still 97%   - Echo 03/29/18 wnl    Continues with Symptoms are markedly disproportionate to objective findings and not clear to what extent this is actually a pulmonary  problem but pt does appear to have difficult to sort out respiratory symptoms of unknown origin for which  DDX  = almost all start with A and  include Adherence, Ace Inhibitors, Acid Reflux, Active Sinus Disease, Alpha 1 Antitripsin deficiency, Anxiety masquerading as Airways dz,  ABPA,  Allergy(esp in young), Aspiration (esp in elderly), Adverse effects of meds,  Active smoking or Vaping, A bunch of PE's/clot burden (a few small clots can't cause this syndrome unless there is already severe underlying pulm or vascular dz with poor reserve),  Anemia or thyroid disorder, plus two Bs  = Bronchiectasis and Beta blocker use..and one C= CHF     Adherence is always the initial "prime suspect" and is a multilayered concern that requires a "trust but verify" approach in every patient - starting with knowing how to use medications, especially inhalers, correctly, keeping up with refills and understanding the fundamental difference between maintenance and prns vs those medications only taken for a very short course and then stopped and not refilled.  - not taking ppi ac as rec  - not using sugarless candy to suppress urge to clear throat - return The principal here is that until we are certain that the  patients are doing what we've asked  Them  to do, it makes no sense to ask them to do more - ie the trust but verify approach will work best here.      ? Acid (or non-acid) GERD > always difficult to exclude as up to 75% of pts in some series report no assoc GI/ Heartburn symptoms> rec max (24h)  acid suppression and diet restrictions/ reviewed and instructions given in writing.  >>>  Increase ppi to bid ac  ? Asthma > ruled out with neg MCT but he insists saba helps so reasonable to try symb 80 2bid on trial basis - 03/28/2018  After extensive coaching inhaler device,  effectiveness =    75%   ? Anxiety > usually at the bottom of this list of usual suspects but should be much higher on this pt's based on H and P  and may interfere with adherence and also interpretation of response or lack thereof to symptom management which can be quite subjective.   ? chf > nl echo noted

## 2018-03-31 NOTE — Assessment & Plan Note (Signed)
Gabapentin 100 tid rec 03/09/18 > rec  Titrate up  to 300 tid starting 03/28/2018     Upper airway cough syndrome (previously labeled PNDS),  is so named because it's frequently impossible to sort out how much is  CR/sinusitis with freq throat clearing (which can be related to primary GERD)   vs  causing  secondary (" extra esophageal")  GERD from wide swings in gastric pressure that occur with throat clearing, often  promoting self use of mint and menthol lozenges that reduce the lower esophageal sphincter tone and exacerbate the problem further in a cyclical fashion.   These are the same pts (now being labeled as having "irritable larynx syndrome" by some cough centers) who not infrequently have a history of having failed to tolerate ace inhibitors,  dry powder inhalers or biphosphonates or report having atypical/extraesophageal reflux symptoms that don't respond to standard doses of PPI  and are easily confused as having aecopd or asthma flares by even experienced allergists/ pulmonologists (myself included).   rec max rx with gabapentin = 300 tid then return to regroup Low threshold to stop all inhalers as they have potential to make this problem worse.   I had an extended discussion with the patient  reviewing all relevant studies completed to date and  lasting 15 to 20 minutes of a 25 minute visit  which included directly observing ambulatory 02 saturation study documented in a/p section of  today's  office note.  See device teaching which extended face to face time for this visit   Each maintenance medication was reviewed in detail including most importantly the difference between maintenance and prns and under what circumstances the prns are to be triggered using an action plan format that is not reflected in the computer generated alphabetically organized AVS.     Please see AVS for specific instructions unique to this visit that I personally wrote and verbalized to the the pt in detail  and then reviewed with pt  by my nurse highlighting any changes in therapy recommended at today's visit .

## 2018-04-11 ENCOUNTER — Ambulatory Visit (HOSPITAL_COMMUNITY): Payer: No Typology Code available for payment source | Admitting: Anesthesiology

## 2018-04-11 ENCOUNTER — Encounter (HOSPITAL_COMMUNITY)
Admission: RE | Disposition: A | Discharge status: Home / Self Care | Payer: Self-pay | Source: Home / Self Care | Attending: Gastroenterology

## 2018-04-11 ENCOUNTER — Ambulatory Visit (HOSPITAL_COMMUNITY)
Admission: RE | Admit: 2018-04-11 | Discharge: 2018-04-11 | Disposition: A | Discharge status: Home / Self Care | Payer: No Typology Code available for payment source | Attending: Gastroenterology | Admitting: Gastroenterology

## 2018-04-11 ENCOUNTER — Encounter (HOSPITAL_COMMUNITY): Payer: Self-pay | Admitting: Emergency Medicine

## 2018-04-11 ENCOUNTER — Other Ambulatory Visit: Payer: Self-pay

## 2018-04-11 DIAGNOSIS — F419 Anxiety disorder, unspecified: Secondary | ICD-10-CM | POA: Insufficient documentation

## 2018-04-11 DIAGNOSIS — R1314 Dysphagia, pharyngoesophageal phase: Secondary | ICD-10-CM | POA: Diagnosis not present

## 2018-04-11 DIAGNOSIS — Z79899 Other long term (current) drug therapy: Secondary | ICD-10-CM | POA: Diagnosis not present

## 2018-04-11 DIAGNOSIS — Z7951 Long term (current) use of inhaled steroids: Secondary | ICD-10-CM | POA: Diagnosis not present

## 2018-04-11 DIAGNOSIS — K2950 Unspecified chronic gastritis without bleeding: Secondary | ICD-10-CM | POA: Insufficient documentation

## 2018-04-11 DIAGNOSIS — K219 Gastro-esophageal reflux disease without esophagitis: Secondary | ICD-10-CM | POA: Diagnosis not present

## 2018-04-11 DIAGNOSIS — J45909 Unspecified asthma, uncomplicated: Secondary | ICD-10-CM | POA: Diagnosis not present

## 2018-04-11 DIAGNOSIS — K449 Diaphragmatic hernia without obstruction or gangrene: Secondary | ICD-10-CM | POA: Diagnosis not present

## 2018-04-11 DIAGNOSIS — Z87442 Personal history of urinary calculi: Secondary | ICD-10-CM | POA: Insufficient documentation

## 2018-04-11 DIAGNOSIS — K222 Esophageal obstruction: Secondary | ICD-10-CM | POA: Diagnosis not present

## 2018-04-11 DIAGNOSIS — K224 Dyskinesia of esophagus: Secondary | ICD-10-CM | POA: Insufficient documentation

## 2018-04-11 HISTORY — PX: BIOPSY: SHX5522

## 2018-04-11 HISTORY — PX: ESOPHAGOGASTRODUODENOSCOPY (EGD) WITH PROPOFOL: SHX5813

## 2018-04-11 HISTORY — PX: ESOPHAGEAL MANOMETRY: SHX5429

## 2018-04-11 SURGERY — ESOPHAGOGASTRODUODENOSCOPY (EGD) WITH PROPOFOL
Anesthesia: Monitor Anesthesia Care

## 2018-04-11 SURGERY — MANOMETRY, ESOPHAGUS

## 2018-04-11 MED ORDER — LACTATED RINGERS IV SOLN
INTRAVENOUS | Status: DC
  Administered 2018-04-11: 10:00:00 via INTRAVENOUS

## 2018-04-11 MED ORDER — PROPOFOL 10 MG/ML IV BOLUS
INTRAVENOUS | Status: AC
  Filled 2018-04-11: qty 40

## 2018-04-11 MED ORDER — PROPOFOL 500 MG/50ML IV EMUL
INTRAVENOUS | Status: DC | PRN
  Administered 2018-04-11: 120 ug/kg/min via INTRAVENOUS

## 2018-04-11 MED ORDER — PROPOFOL 10 MG/ML IV BOLUS
INTRAVENOUS | Status: DC | PRN
  Administered 2018-04-11: 20 mg via INTRAVENOUS
  Administered 2018-04-11: 30 mg via INTRAVENOUS
  Administered 2018-04-11 (×2): 20 mg via INTRAVENOUS
  Administered 2018-04-11 (×2): 30 mg via INTRAVENOUS

## 2018-04-11 MED ORDER — SODIUM CHLORIDE 0.9 % IV SOLN: INTRAVENOUS | Status: DC

## 2018-04-11 SURGICAL SUPPLY — 15 items

## 2018-04-11 SURGICAL SUPPLY — 2 items
FACESHIELD LNG OPTICON STERILE (SAFETY) IMPLANT
GLOVE BIO SURGEON STRL SZ8 (GLOVE) ×6 IMPLANT

## 2018-04-11 NOTE — H&P (Signed)
Primary Care Physician:  Celene Squibb, MD Primary Gastroenterologist:  Dr. Alessandra Bevels  Reason for Visit : Esophageal dysphagia, esophageal dysmotility  HPI: Zachary Nash is a 48 y.o. male here for EGD and placement of manometry probe.  Patient with longstanding history of dysphagia for last 10 to 12 years.  Recent EGD in Dec 2018 by Dr. Laural Golden showed mild esophagitis and lower esophageal ring.  No improvement in symptoms with dilatation.  Motility disorder was suspected and he was scheduled for manometry but had allergic reaction to lidocaine and it was aborted.  He continues to have intermittent symptoms of dysphagia.  Denies any other GI symptoms.    Past Medical History:  Diagnosis Date  . Anxiety   . Asthma   . Bilateral inguinal hernia 05/29/2016  . Complication of anesthesia    states he stopped breathing one time with anesthesia  . Constipation   . Fatty liver   . GERD (gastroesophageal reflux disease)   . History of kidney stones   . Pneumonia   . PONV (postoperative nausea and vomiting)   . Radicular pain of left lower extremity     Past Surgical History:  Procedure Laterality Date  . ANTERIOR CERVICAL DECOMP/DISCECTOMY FUSION N/A 05/26/2017   Procedure: ANTERIOR CERVICAL DECOMPRESSION/DISCECTOMY FUSION TWO LEVELS Cervical five-six, Cervical six-seven;  Surgeon: Phylliss Bob, MD;  Location: [place];  Service: Orthopedics;  Laterality: N/A;  . CHOLECYSTECTOMY    . ESOPHAGEAL DILATION N/A 03/24/2017   Procedure: ESOPHAGEAL DILATION;  Surgeon: Rogene Houston, MD;  [facility];  Service: Endoscopy;  Laterality: N/A;  . ESOPHAGOGASTRODUODENOSCOPY N/A 03/24/2017   Procedure: ESOPHAGOGASTRODUODENOSCOPY (EGD);  Surgeon: Rogene Houston, MD;  Location: [facility];  Service: Endoscopy;  Laterality: N/A;  1155  . INGUINAL HERNIA REPAIR Bilateral 05/29/2016   Procedure: OPEN HERNIA REPAIR INGUINAL ADULT BILATERAL WITH insertion of MESH;  Surgeon: Fanny Skates,  MD;  Location: [place];  Service: General;  Laterality: Bilateral;  . Lipoma removed      Prior to Admission medications   Medication Sig Start Date End Date Taking? Authorizing Provider  albuterol (PROVENTIL HFA;VENTOLIN HFA) 108 (90 BASE) MCG/ACT inhaler Inhale 2 puffs into the lungs every 6 (six) hours as needed for wheezing or shortness of breath.    Yes [provider]  baclofen (LIORESAL) 10 MG tablet Take 10 mg by mouth 3 (three) times daily as needed for muscle spasms.    Yes [provider]  budesonide-formoterol (SYMBICORT) 80-4.5 MCG/ACT inhaler Inhale 2 puffs into the lungs 2 (two) times daily. 03/28/18  Yes Tanda Rockers, MD  DULoxetine (CYMBALTA) 20 MG capsule Take 20 mg by mouth at bedtime.  02/03/18  Yes [provider]  gabapentin (NEURONTIN) 100 MG capsule Take 1 capsule (100 mg total) by mouth 3 (three) times daily. One three times daily Patient taking differently: Take 200 mg by mouth 2 (two) times daily.  03/09/18  Yes Tanda Rockers, MD  methocarbamol (ROBAXIN) 750 MG tablet Take 750 mg by mouth 2 (two) times daily as needed for muscle spasms.    Yes [provider]  naproxen sodium (ALEVE) 220 MG tablet Take 440 mg by mouth daily as needed (for pain or headache).    Yes [provider]  OXYGEN Inhale 2 L into the lungs at bedtime.    Yes [provider]  pantoprazole (PROTONIX) 40 MG tablet Take 1 tablet (40 mg total) by mouth 2 (two) times daily before a  meal. 10/06/17  Yes Setzer, Terri L, NP  diazepam (VALIUM) 5 MG tablet Take 1 tablet (5 mg total) by mouth every 6 (six) hours as needed for muscle spasms. Patient taking differently: Take 5 mg by mouth at bedtime.  05/27/17   Phylliss Bob, MD    Scheduled Meds: Continuous Infusions: PRN Meds:.  Allergies as of 02/23/2018 - Review Complete 11/02/2017  Allergen Reaction Noted  . Penicillins Anaphylaxis and Swelling 03/17/2012    Family History  Problem Relation  Age of Onset  . Heart failure Father   . Heart attack Father   . Ovarian cancer Mother   . Arthritis Mother   . Anxiety disorder Mother     Social History   Socioeconomic History  . Marital status: Married    Spouse name: Not on file  . Number of children: Not on file  . Years of education: Not on file  . Highest education level: Not on file  Occupational History  . Not on file  Social Needs  . Financial resource strain: Not on file  . Food insecurity:    Worry: Not on file    Inability: Not on file  . Transportation needs:    Medical: Not on file    Non-medical: Not on file  Tobacco Use  . Smoking status: Never Smoker  . Smokeless tobacco: Never Used  Substance and Sexual Activity  . Alcohol use: No  . Drug use: No  . Sexual activity: Not on file  Lifestyle  . Physical activity:    Days per week: Not on file    Minutes per session: Not on file  . Stress: Not on file  Relationships  . Social connections:    Talks on phone: Not on file    Gets together: Not on file    Attends religious service: Not on file    Active member of club or organization: Not on file    Attends meetings of clubs or organizations: Not on file    Relationship status: Not on file  . Intimate partner violence:    Fear of current or ex partner: Not on file    Emotionally abused: Not on file    Physically abused: Not on file    Forced sexual activity: Not on file  Other Topics Concern  . Not on file  Social History Narrative  . Not on file    Review of Systems: All negative except as stated above in HPI.  Physical Exam: Vital signs: There were no vitals filed for this visit.   General:   Alert,  Well-developed, well-nourished, pleasant and cooperative in NAD Lungs:  Clear throughout to auscultation.   No wheezes, crackles, or rhonchi. No acute distress. Heart:  Regular rate and rhythm; no murmurs, clicks, rubs,  or gallops. Abdomen: Soft, nontender, nondistended, bowel sounds  present. Rectal:  Deferred  GI:  Lab Results: No results for input(s): WBC, HGB, HCT, PLT in the last 72 hours. BMET No results for input(s): NA, K, CL, CO2, GLUCOSE, BUN, CREATININE, CALCIUM in the last 72 hours. LFT No results for input(s): PROT, ALBUMIN, AST, ALT, ALKPHOS, BILITOT, BILIDIR, IBILI in the last 72 hours. PT/INR No results for input(s): LABPROT, INR in the last 72 hours.   Studies/Results: No results found.  Impression/Plan: -Chronic  esophageal dysphagia -H/O  esophagitis -Esophageal dysmotility  Recommendations ------------------------- - EGD with placement of manometry probe today.  Risks (bleeding, infection, bowel perforation that could require surgery, sedation-related changes in cardiopulmonary systems),  benefits (identification and possible treatment of source of symptoms, exclusion of certain causes of symptoms), and alternatives (watchful waiting, radiographic imaging studies, empiric medical treatment)  were explained to patient and family in detail and patient wishes to proceed.    LOS: 0 days   Otis Brace  MD, FACP 04/11/2018, 9:10 AM  Contact #  [phone]

## 2018-04-11 NOTE — Anesthesia Preprocedure Evaluation (Signed)
Anesthesia Evaluation  Patient identified by MRN, date of birth, ID band Patient awake    Reviewed: Allergy & Precautions, NPO status , Patient's Chart, lab work & pertinent test results  History of Anesthesia Complications (+) PONV  Airway Mallampati: II  TM Distance: >3 FB Neck ROM: Full    Dental no notable dental hx.    Pulmonary sleep apnea ,    Pulmonary exam normal breath sounds clear to auscultation       Cardiovascular negative cardio ROS Normal cardiovascular exam Rhythm:Regular Rate:Normal     Neuro/Psych negative neurological ROS  negative psych ROS   GI/Hepatic negative GI ROS, Neg liver ROS,   Endo/Other  negative endocrine ROS  Renal/GU negative Renal ROS  negative genitourinary   Musculoskeletal negative musculoskeletal ROS (+)   Abdominal   Peds negative pediatric ROS (+)  Hematology negative hematology ROS (+)   Anesthesia Other Findings   Reproductive/Obstetrics negative OB ROS                             Anesthesia Physical Anesthesia Plan  ASA: II  Anesthesia Plan: MAC   Post-op Pain Management:    Induction:   PONV Risk Score and Plan: 2 and Treatment may vary due to age or medical condition and Ondansetron  Airway Management Planned: Nasal Cannula  Additional Equipment:   Intra-op Plan:   Post-operative Plan: Extubation in OR  Informed Consent: I have reviewed the patients History and Physical, chart, labs and discussed the procedure including the risks, benefits and alternatives for the proposed anesthesia with the patient or authorized representative who has indicated his/her understanding and acceptance.   Dental advisory given  Plan Discussed with: CRNA  Anesthesia Plan Comments:         Anesthesia Quick Evaluation

## 2018-04-11 NOTE — Progress Notes (Signed)
Patient tolerated esophageal manometry without complications.  Dr. Michail Sermon to interpret results.

## 2018-04-11 NOTE — Anesthesia Procedure Notes (Signed)
Procedure Name: MAC Date/Time: 04/11/2018 10:34 AM Performed by: Lollie Sails, CRNA Pre-anesthesia Checklist: Patient identified, Emergency Drugs available, Suction available and Patient being monitored Oxygen Delivery Method: Nasal cannula

## 2018-04-11 NOTE — Anesthesia Postprocedure Evaluation (Signed)
Anesthesia Post Note  Patient: Zachary Nash  Procedure(s) Performed: ESOPHAGOGASTRODUODENOSCOPY (EGD) WITH PROPOFOL (N/A ) BIOPSY     Patient location during evaluation: Endoscopy Anesthesia Type: MAC Level of consciousness: awake and alert Pain management: pain level controlled Vital Signs Assessment: post-procedure vital signs reviewed and stable Respiratory status: spontaneous breathing, nonlabored ventilation, respiratory function stable and patient connected to nasal cannula oxygen Cardiovascular status: stable and blood pressure returned to baseline Postop Assessment: no apparent nausea or vomiting Anesthetic complications: no    Last Vitals:  Vitals:   04/11/18 1110 04/11/18 1120  BP: 119/78 112/81  Pulse: 88 77  Resp: 19 17  Temp:    SpO2: 99% 100%    Last Pain:  Vitals:   04/11/18 1110  TempSrc:   PainSc: 10-Worst pain ever                 Montez Hageman

## 2018-04-11 NOTE — Transfer of Care (Signed)
Immediate Anesthesia Transfer of Care Note  Patient: Zachary Nash  Procedure(s) Performed: ESOPHAGOGASTRODUODENOSCOPY (EGD) WITH PROPOFOL (N/A )  Patient Location: PACU and Endoscopy Unit  Anesthesia Type:MAC  Level of Consciousness: awake, drowsy and responds to stimulation  Airway & Oxygen Therapy: Patient Spontanous Breathing and Patient connected to nasal cannula oxygen  Post-op Assessment: Report given to RN and Post -op Vital signs reviewed and stable  Post vital signs: Reviewed and stable  Last Vitals:  Vitals Value Taken Time  BP    Temp    Pulse 91 04/11/2018 11:00 AM  Resp    SpO2 100 % 04/11/2018 11:00 AM  Vitals shown include unvalidated device data.  Last Pain:  Vitals:   04/11/18 0938  TempSrc: Oral         Complications: No apparent anesthesia complications

## 2018-04-11 NOTE — Op Note (Signed)
[facility] Patient Name: Zachary Nash Procedure Date: 04/11/2018 MRN: [id number] Attending MD: Otis Brace , MD Date of Birth: 07-Aug-1970 CSN: [id number] Age: 48 Admit Type: Outpatient Procedure:                Upper GI endoscopy Indications:              Esophageal dysphagia Providers:                Otis Brace, MD, Cleda Daub, RN, Tinnie Gens, Technician, [address]. Zachary Resides CRNA, Nash Referring MD:              Medicines:                Sedation Administered by an Anesthesia Professional Complications:            No immediate complications. Estimated Blood Loss:     Estimated blood loss was minimal. Procedure:                Pre-Anesthesia Assessment:                           - Prior to the procedure, a History and Physical                            was performed, and patient medications and                            allergies were reviewed. The patient's tolerance of                            previous anesthesia was also reviewed. The risks                            and benefits of the procedure and the sedation                            options and risks were discussed with the patient.                            All questions were answered, and informed consent                            was obtained. Prior Anticoagulants: The patient has                            taken no previous anticoagulant or antiplatelet                            agents. ASA Grade Assessment: II - A patient with                            mild systemic disease. After reviewing the risks  and benefits, the patient was deemed in                            satisfactory condition to undergo the procedure.                           After obtaining informed consent, the endoscope was                            passed under direct vision. Throughout the                            procedure, the patient's blood pressure,  pulse, and                            oxygen saturations were monitored continuously. The                            GIF-H190 ([id number] Olympus adult endoscope was                            introduced through the mouth, and advanced to the                            second part of duodenum. The upper GI endoscopy was                            accomplished without difficulty. The patient                            tolerated the procedure well. Scope In: Scope Out: Findings:      The Z-line was regular and was found 35 cm from the incisors.      A widely patent Schatzki ring was found at the gastroesophageal junction.      A small hiatal hernia was present. An esophageal impedance monitoring       catheter was placed.      Normal mucosa was found in the entire examined stomach. Biopsies were       taken with a cold forceps for histology.      The cardia and gastric fundus were normal on retroflexion.      Localized mild mucosal changes characterized by altered texture and       white specks were found in the second portion of the duodenum. Biopsies       were taken with a cold forceps for histology.      The exam of the duodenum was otherwise normal up to second part. Impression:               - Z-line regular, 35 cm from the incisors.                           - Widely patent Schatzki ring.                           - Small hiatal hernia.                           -  Normal mucosa was found in the entire stomach.                            Biopsied.                           - Mucosal changes in the duodenum. Biopsied.                           - An esophageal impedance monitoring catheter was                            placed. Moderate Sedation:      Moderate (conscious) sedation was personally administered by an       anesthesia professional. The following parameters were monitored: oxygen       saturation, heart rate, blood pressure, and response to care. Recommendation:            - Patient has a contact number available for                            emergencies. The signs and symptoms of potential                            delayed complications were discussed with the                            patient. Return to normal activities tomorrow.                            Written discharge instructions were provided to the                            patient.                           - Resume previous diet.                           - Continue present medications.                           - Await pathology results.                           - Return to my office as previously scheduled. Procedure Code(s):        --- Professional ---                           [phone], Esophagogastroduodenoscopy, flexible,                            transoral; with biopsy, single or multiple Diagnosis Code(s):        --- Professional ---                           K22.2, Esophageal obstruction  K44.9, Diaphragmatic hernia without obstruction or                            gangrene                           K31.89, Other diseases of stomach and duodenum                           R13.14, Dysphagia, pharyngoesophageal phase CPT copyright 2018 American [employer]. All rights reserved. The codes documented in this report are preliminary and upon coder review may  be revised to meet current compliance requirements. Otis Brace, MD Otis Brace, MD 04/11/2018 10:58:32 AM Number of Addenda: 0

## 2018-04-11 NOTE — Discharge Instructions (Signed)

## 2018-04-12 ENCOUNTER — Encounter (HOSPITAL_COMMUNITY): Payer: Self-pay | Admitting: Gastroenterology

## 2018-04-13 ENCOUNTER — Ambulatory Visit: Payer: Self-pay | Admitting: Pulmonary Disease

## 2018-04-25 ENCOUNTER — Ambulatory Visit: Payer: Self-pay | Admitting: Internal Medicine

## 2018-04-26 ENCOUNTER — Ambulatory Visit (INDEPENDENT_AMBULATORY_CARE_PROVIDER_SITE_OTHER): Payer: No Typology Code available for payment source | Admitting: Internal Medicine

## 2018-04-26 ENCOUNTER — Encounter: Payer: Self-pay | Admitting: Internal Medicine

## 2018-04-26 VITALS — BP 104/62 | HR 80 | Ht 70.0 in | Wt 202.0 lb

## 2018-04-26 DIAGNOSIS — R05 Cough: Secondary | ICD-10-CM

## 2018-04-26 DIAGNOSIS — R058 Other specified cough: Secondary | ICD-10-CM

## 2018-04-26 DIAGNOSIS — R0609 Other forms of dyspnea: Secondary | ICD-10-CM

## 2018-04-26 MED ORDER — GABAPENTIN 300 MG PO CAPS: 300.0000 mg | ORAL_CAPSULE | Freq: Three times a day (TID) | ORAL | 2 refills | Status: DC

## 2018-04-26 NOTE — Patient Instructions (Signed)
Gabapentin increase to 300 mg three times a day   Only use your albuterol as a rescue medication to be used if you can't catch your breath by resting or doing a relaxed purse lip breathing pattern.  - The less you use it, the better it will work when you need it. - Ok to use up to 2 puffs  every 4 hours if you must but call for immediate appointment if use goes up over your usual need - Don't leave home without it !!  (think of it like the spare tire for your car)    Please schedule a follow up office visit in 6 weeks, call sooner if needed with all medications /inhalers/ solutions in hand so we can verify exactly what you are taking. This includes all medications from all doctors and over the counters

## 2018-04-26 NOTE — Progress Notes (Signed)
Zachary Nash, male    DOB: [birth year],   MRN: [id number]      Brief patient profile: 9 yowm never smoker referred to pulmonary clinic 03/09/2018 by Dr  Delphina Cahill for unexplained sob rest and ex, assoc dry cough and throat tightness and dysphagia since onset March 2017 worse since neck surgery Feb 2019    Last EGD  Raman was in 02/2017 which revealed  Normal proximal esophagus and med esophagus. LA grade B reflux esophagitis. Benign appearing esophageal stenosis and GEJ. Dilated.      History of Present Illness  03/09/2018  Pulmonary/ 1st office eval/Zachary Nash  Chief Complaint  Patient presents with  . Pulmonary Consult    Referred by Dr. Meade Maw. Pt c/o DOE x 1 month. He gets SOB walking short distances such as from lobby to exam room. He also c/o occ CP. He has a non prod cough that occurs with exertion.   Dyspnea:  100 ft flat and slow, some better at rest and lying on L side on nasal 02 just started 3 weeks prior to OV   Cough: 24/7/dry  Sleep: only on side/ bed horizontal SABA use: helps some  rec GERD diet Continue protonix 40 mg  Take 30-60 min before first meal of the day including the day of your PFTs Start gabapentin 100 mg three times a day    03/28/2018  f/u ov/Zachary Nash re:  prob uacs Chief Complaint  Patient presents with  . Follow-up    Breathing is unchanged. He has been wheezing more over the past 1-2 wks. He also c/o non prod cough and chest tightness. He is using his albuterol inhaler 1-2 x per day on average.  Dyspnea:  13f even slow pace Cough: dry assoc hoarseness/ seems worse 11 pm goes to bed at 1130  Sleeping: some bed blocks, still wakes up 2-3 x with cough/ sob  SABA use: during the day usually twice daily, seems to help 02: 2lpm hs only  Has not worked since Neck surgery 04/2017  rec Plan A = Automatic = Symbicort 80 1-2 puffs  every 12 hours  Work on inhaler technique: Plan B = Backup Only use your albuterol inhaler as a rescue  medication Please see patient coordinator before you leave today  to schedule echocardiogram asap  Please schedule a follow up office visit in 4 weeks, sooner if needed  with all medications /inhalers/ solutions in hand so we can verify exactly what you are taking. This includes all medications from all doctors and over the counters    04/26/2018  f/u ov/Zachary Nash re: uacs improved on gabapentin 200 bid  Chief Complaint  Patient presents with  . Follow-up    Cough has resovled. His breathing is unchanged. He is using his albuterol inhaler 2 x daily on average.   Dyspnea:  No change doe Cough: daytime, does not wake him up  Sleeping: bed blocks/ 02 SABA use: twice daily on or off the symbicort did not make any difference (note he didn't change it to prn anyway as instructed and thinks the symbicort made him worse so never took it long enough to be sure)  02: 2lpm hs only    No obvious day to day or daytime variability or assoc excess/ purulent sputum or mucus plugs or hemoptysis or cp or chest tightness, subjective wheeze or overt sinus or hb symptoms.   Sleeping as above  without nocturnal  or early am exacerbation  of respiratory  c/o's or need  for noct saba. Also denies any obvious fluctuation of symptoms with weather or environmental changes or other aggravating or alleviating factors except as outlined above   No unusual exposure hx or h/o childhood pna/ asthma or knowledge of premature birth.  Current Allergies, Complete Past Medical History, Past Surgical History, Family History, and Social History were reviewed in [employer] record.  ROS  The following are not active complaints unless bolded Hoarseness, sore throat, dysphagia, dental problems, itching, sneezing,  nasal congestion or discharge of excess mucus or purulent secretions, ear ache,   fever, chills, sweats, unintended wt loss or wt gain, classically pleuritic or exertional cp,  orthopnea pnd or arm/hand  swelling  or leg swelling, presyncope, palpitations, abdominal pain, anorexia, nausea, vomiting, diarrhea  or change in bowel habits or change in bladder habits, change in stools or change in urine, dysuria, hematuria,  rash, arthralgias, visual complaints, headache, numbness, weakness L foot or ataxia or problems with walking or coordination,  change in mood or  memory.          Current Meds  Medication Sig  . albuterol (PROVENTIL HFA;VENTOLIN HFA) 108 (90 BASE) MCG/ACT inhaler Inhale 2 puffs into the lungs every 6 (six) hours as needed for wheezing or shortness of breath.   . baclofen (LIORESAL) 10 MG tablet Take 10 mg by mouth 3 (three) times daily as needed for muscle spasms.   . budesonide-formoterol (SYMBICORT) 80-4.5 MCG/ACT inhaler Inhale 2 puffs into the lungs 2 (two) times daily.  . DULoxetine (CYMBALTA) 20 MG capsule Take 20 mg by mouth at bedtime.   . gabapentin (NEURONTIN) 100 MG capsule Take 100 mg by mouth 4 (four) times daily.  . methocarbamol (ROBAXIN) 750 MG tablet Take 750 mg by mouth 2 (two) times daily as needed for muscle spasms.   . naproxen sodium (ALEVE) 220 MG tablet Take 440 mg by mouth daily as needed (for pain or headache).   . OXYGEN Inhale 2 L into the lungs at bedtime.   . pantoprazole (PROTONIX) 40 MG tablet Take 1 tablet (40 mg total) by mouth 2 (two) times daily before a meal.                   Objective:     amb wm with L foot brace  04/26/2018       202   03/28/18 206 lb (93.4 kg)  03/09/18 200 lb 6.4 oz (90.9 kg)  11/02/17 196 lb (88.9 kg)     Vital signs reviewed - Note on arrival 02 sats  99% on RA      HEENT: nl dentition, turbinates bilaterally, and oropharynx. Nl external ear canals without cough reflex   NECK :  without JVD/Nodes/TM/ nl carotid upstrokes bilaterally   LUNGS: no acc muscle use,  Nl contour chest which is clear to A and P bilaterally without cough on insp or exp maneuvers   CV:  RRR  no s3 or murmur or increase  in P2, and no edema   ABD:  soft and nontender with nl inspiratory excursion in the supine position. No bruits or organomegaly appreciated, bowel sounds nl  MS:  Nl gait/ ext warm with L foot brace in place , calf tenderness, cyanosis or clubbing No obvious joint restrictions x for the L foot  SKIN: warm and dry without lesions    NEURO:  alert, approp, nl sensorium with  no motor or cerebellar deficits apparent.  I personally reviewed images and agree with radiology impression as follows:  CXR:   03/29/18 No active cardiopulmonary disease.       Assessment

## 2018-04-27 ENCOUNTER — Encounter: Payer: Self-pay | Admitting: Internal Medicine

## 2018-04-27 NOTE — Assessment & Plan Note (Addendum)
Onset March 2017  -  03/09/2018   Walked RA x one lap = 210 ft - stopped due to  Sob, slow pace with limp, no desat - Spirometry 03/09/2018  FEV1 3.1 (77%)  Ratio 79 with classic vcd f/v loop  - Allergy profile 03/09/18  >  Eos 0.4 /  IgE  117 RAST pos dust, oak trees, roaches - MCT  03/16/18 neg  - 03/28/2018   Walked RA  2 laps @ 29f each @ avg pace with limp  stopped due to  Sob with sats still 97% > try symbicort 80 2bid    - Echo 03/29/18 wnl  - 03/29/18 d/c'd symb 80 which made cough worse   Strongly suspect this is a conditioning issue at this point and no further work-up recommended until we have a cough eliminated. Ideally he will need a CPST to sort out if he could tolerate a bicycle ergometer as I doubt he could do a treadmill due to his left foot problems.  Again reviewed the appropriate use of albuterol in the setting of no diagnosis. I spent extra time with pt today reviewing appropriate use of albuterol for prn use on exertion with the following points: 1) saba is for relief of sob that does not improve by walking a slower pace or resting but rather if the pt does not improve after trying this first. 2) If the pt is convinced, as many are, that saba helps recover from activity faster then it's easy to tell if this is the case by re-challenging : ie stop, take the inhaler, then p 5 minutes try the exact same activity (intensity of workload) that just caused the symptoms and see if they are substantially diminished or not after saba 3) if there is an activity that reproducibly causes the symptoms, try the saba 15 min before the activity on alternate days   If in fact the saba really does help, then fine to continue to use it prn but advised may need to look closer at the maintenance regimen being used to achieve better control of airways disease with exertion.

## 2018-04-27 NOTE — Assessment & Plan Note (Addendum)
Last EGD  Raman was in 02/2017 which revealed  Normal proximal esophagus and med esophagus. LA grade B reflux esophagitis. Benign appearing esophageal stenosis and GEJ. Dilated.  - Worsened after neck surgery Feb 2019 - Gabapentin 100 tid rec 03/09/18 >  rec  Titrate up  to 100 qid starting 03/28/2018  - aggravated by use of symbicort 80 03/28/18     -Recommended 300 mg 3 times a day  04/26/2018   It does appear that he is responding to gabapentin but is still on a very low-dose.  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   I had an extended discussion with the patient reviewing all relevant studies completed to date and  lasting 15 to 20 minutes of a 25 minute visit    Each maintenance medication was reviewed in detail including most importantly the difference between maintenance and prns and under what circumstances the prns are to be triggered using an action plan format that is not reflected in the computer generated alphabetically organized AVS.     Please see AVS for specific instructions unique to this visit that I personally wrote and verbalized to the the pt in detail and then reviewed with pt  by my nurse highlighting any  changes in therapy recommended at today's visit to their plan of care.

## 2018-05-24 ENCOUNTER — Encounter (HOSPITAL_COMMUNITY): Payer: Self-pay | Admitting: Emergency Medicine

## 2018-05-24 ENCOUNTER — Emergency Department (HOSPITAL_COMMUNITY): Payer: No Typology Code available for payment source

## 2018-05-24 ENCOUNTER — Other Ambulatory Visit: Payer: Self-pay

## 2018-05-24 ENCOUNTER — Emergency Department (HOSPITAL_COMMUNITY)
Admission: EM | Admit: 2018-05-24 | Discharge: 2018-05-24 | Disposition: A | Discharge status: Home / Self Care | Payer: No Typology Code available for payment source | Attending: Emergency Medicine | Admitting: Emergency Medicine

## 2018-05-24 DIAGNOSIS — Y939 Activity, unspecified: Secondary | ICD-10-CM | POA: Insufficient documentation

## 2018-05-24 DIAGNOSIS — X58XXXA Exposure to other specified factors, initial encounter: Secondary | ICD-10-CM | POA: Insufficient documentation

## 2018-05-24 DIAGNOSIS — S199XXA Unspecified injury of neck, initial encounter: Secondary | ICD-10-CM | POA: Diagnosis present

## 2018-05-24 DIAGNOSIS — M5412 Radiculopathy, cervical region: Secondary | ICD-10-CM | POA: Insufficient documentation

## 2018-05-24 DIAGNOSIS — S161XXA Strain of muscle, fascia and tendon at neck level, initial encounter: Secondary | ICD-10-CM

## 2018-05-24 DIAGNOSIS — Z79899 Other long term (current) drug therapy: Secondary | ICD-10-CM | POA: Insufficient documentation

## 2018-05-24 DIAGNOSIS — Y929 Unspecified place or not applicable: Secondary | ICD-10-CM | POA: Insufficient documentation

## 2018-05-24 DIAGNOSIS — Y999 Unspecified external cause status: Secondary | ICD-10-CM | POA: Diagnosis not present

## 2018-05-24 DIAGNOSIS — J45909 Unspecified asthma, uncomplicated: Secondary | ICD-10-CM | POA: Diagnosis not present

## 2018-05-24 MED ORDER — PREDNISONE 20 MG PO TABS: 20.0000 mg | ORAL_TABLET | Freq: Two times a day (BID) | ORAL | 0 refills | Status: DC

## 2018-05-24 MED ORDER — HYDROCODONE-ACETAMINOPHEN 5-325 MG PO TABS: 1.0000 | ORAL_TABLET | ORAL | 0 refills | Status: DC | PRN

## 2018-05-24 MED ORDER — DIAZEPAM 5 MG PO TABS: 5.0000 mg | ORAL_TABLET | Freq: Four times a day (QID) | ORAL | 0 refills | Status: DC | PRN

## 2018-05-24 NOTE — Discharge Instructions (Addendum)
It appears that you have a pinched nerve in your neck.  You also may have a muscle strain causing the discomfort.  Use heat on the sore area 3 or 4 times a day.  Follow-up with your neck doctor if not better in 3 to 4 days.

## 2018-05-24 NOTE — ED Triage Notes (Signed)
Pt c/o posterior neck pain that began two days ago. States he turned his head and the pain began. States pain radiates now to LT shoulder, down LT arm and into fingers. Pt also states that he is unable to lift LT arm due to pain. Pt reports some tingling in fingers. Pt had c-spine surgery Feb. 2019.

## 2018-05-24 NOTE — ED Provider Notes (Signed)
[facility] EMERGENCY DEPARTMENT Provider Note   CSN: [id number] Arrival date & time: 05/24/18  1342    History   Chief Complaint Chief Complaint  Patient presents with  . Neck Pain  . Shoulder Pain    HPI Zachary Nash is a 48 y.o. male.     HPI   He complains of pain in his left lower neck starting as "a pop, 3 days ago."  Since then he has had gradually increasing pain, in his neck, that is worse with movement.  He has also had some intermittent numbness in both hands.  He denies difficulty walking.  He denies fever, chills, vomiting or dizziness.  He has a history of similar pain when he required neck surgery, 1 year ago.  He called his orthopedic spine surgeon but since the co-pay for visit was $175, he decided to come here.  He seeks an MRI for imaging to diagnose the problem.  He is not currently employed.  He is try to get disability for his neck.  There are no other known problems.  Past Medical History:  Diagnosis Date  . Anxiety   . Asthma   . Bilateral inguinal hernia 05/29/2016  . Complication of anesthesia    states he stopped breathing one time with anesthesia  . Constipation   . Fatty liver   . GERD (gastroesophageal reflux disease)   . History of kidney stones   . Pneumonia   . PONV (postoperative nausea and vomiting)   . Radicular pain of left lower extremity     Patient Active Problem List   Diagnosis Date Noted  . Upper airway cough syndrome 03/31/2018  . Cough variant asthma 03/28/2018  . DOE (dyspnea on exertion) 03/09/2018  . Nocturnal hypoxemia 03/09/2018  . Spinal cord compression ([place])   . Surgery, elective   . Cervical myelopathy ([place])   . Syncope, vasovagal   . Weakness   . Generalized weakness 05/25/2017  . Left sided numbness 05/24/2017  . Right inguinal pain 04/20/2017  . Dysphagia 03/24/2017  . Bilateral inguinal hernia 05/29/2016  . 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 Hyperglycemia 05/18/2013  . Radicular pain of left lower  extremity   . Dizziness 05/16/2013    Past Surgical History:  Procedure Laterality Date  . ANTERIOR CERVICAL DECOMP/DISCECTOMY FUSION N/A 05/26/2017   Procedure: ANTERIOR CERVICAL DECOMPRESSION/DISCECTOMY FUSION TWO LEVELS Cervical five-six, Cervical six-seven;  Surgeon: Phylliss Bob, MD;  Location: [facility];  Service: Orthopedics;  Laterality: N/A;  . BIOPSY  04/11/2018   Procedure: BIOPSY;  Surgeon: Otis Brace, MD;  Location: [facility];  Service: Gastroenterology;;  . CHOLECYSTECTOMY    . ESOPHAGEAL DILATION N/A 03/24/2017   Procedure: ESOPHAGEAL DILATION;  Surgeon: Rogene Houston, MD;  Location: [facility];  Service: Endoscopy;  Laterality: N/A;  . ESOPHAGEAL MANOMETRY N/A 04/11/2018   Procedure: ESOPHAGEAL MANOMETRY (EM);  Surgeon: Otis Brace, MD;  Location: [facility];  Service: Gastroenterology;  Laterality: N/A;  . ESOPHAGOGASTRODUODENOSCOPY N/A 03/24/2017   Procedure: ESOPHAGOGASTRODUODENOSCOPY (EGD);  Surgeon: Rogene Houston, MD;  Location: [facility];  Service: Endoscopy;  Laterality: N/A;  1155  . ESOPHAGOGASTRODUODENOSCOPY (EGD) WITH PROPOFOL N/A 04/11/2018   Procedure: ESOPHAGOGASTRODUODENOSCOPY (EGD) WITH PROPOFOL;  Surgeon: Otis Brace, MD;  Location: [facility];  Service: Gastroenterology;  Laterality: N/A;  mano probe to be placed during EGD  . INGUINAL HERNIA REPAIR Bilateral 05/29/2016   Procedure: OPEN HERNIA REPAIR INGUINAL ADULT BILATERAL WITH insertion of MESH;  Surgeon: Fanny Skates, MD;  Location: [facility];  Service: General;  Laterality: Bilateral;  . Lipoma removed          Home Medications    Prior to Admission medications   Medication Sig Start Date End Date Taking? Authorizing Provider  albuterol (PROVENTIL HFA;VENTOLIN HFA) 108 (90 BASE) MCG/ACT inhaler Inhale 2 puffs into the lungs every 6 (six) hours as needed for wheezing or shortness of breath.     [provider]  baclofen (LIORESAL) 10 MG tablet Take 10 mg by  mouth 3 (three) times daily as needed for muscle spasms.     [provider]  diazepam (VALIUM) 5 MG tablet Take 1 tablet (5 mg total) by mouth every 6 (six) hours as needed for muscle spasms (spasms). 05/24/18   Daleen Bo, MD  DULoxetine (CYMBALTA) 20 MG capsule Take 20 mg by mouth at bedtime.  02/03/18   [provider]  gabapentin (NEURONTIN) 300 MG capsule Take 1 capsule (300 mg total) by mouth 3 (three) times daily. 04/26/18   Tanda Rockers, MD  HYDROcodone-acetaminophen (NORCO) 5-325 MG tablet Take 1 tablet by mouth every 4 (four) hours as needed for moderate pain. 05/24/18   Daleen Bo, MD  methocarbamol (ROBAXIN) 750 MG tablet Take 750 mg by mouth 2 (two) times daily as needed for muscle spasms.     [provider]  naproxen sodium (ALEVE) 220 MG tablet Take 440 mg by mouth daily as needed (for pain or headache).     [provider]  OXYGEN Inhale 2 L into the lungs at bedtime.     [provider]  pantoprazole (PROTONIX) 40 MG tablet Take 1 tablet (40 mg total) by mouth 2 (two) times daily before a meal. 10/06/17   Setzer, Rona Ravens, NP  predniSONE (DELTASONE) 20 MG tablet Take 1 tablet (20 mg total) by mouth 2 (two) times daily. 05/24/18   Daleen Bo, MD    Family History Family History  Problem Relation Age of Onset  . Heart failure Father   . Heart attack Father   . Ovarian cancer Mother   . Arthritis Mother   . Anxiety disorder Mother     Social History Social History   Tobacco Use  . Smoking status: Never Smoker  . Smokeless tobacco: Never Used  Substance Use Topics  . Alcohol use: No  . Drug use: No     Allergies   Penicillins and Lidocaine   Review of Systems Review of Systems  All other systems reviewed and are negative.    Physical Exam Updated Vital Signs BP (!) 139/94   Pulse 84   Temp 97.9 F (36.6 C) (Oral)   Resp 16   Ht 5' 10"  (1.778 m)   Wt 95.3 kg   SpO2 100%   BMI 30.13 kg/m    Physical Exam Vitals signs and nursing note reviewed.  Constitutional:      General: He is not in acute distress.    Appearance: Normal appearance. He is well-developed and normal weight. He is not ill-appearing, toxic-appearing or diaphoretic.  HENT:     Head: Normocephalic and atraumatic.     Right Ear: External ear normal.     Left Ear: External ear normal.  Eyes:     Conjunctiva/sclera: Conjunctivae normal.     Pupils: Pupils are equal, round, and reactive to light.  Neck:     Musculoskeletal: Normal range of motion and neck supple.     Trachea: Phonation normal.  Cardiovascular:  Rate and Rhythm: Normal rate.  Pulmonary:     Effort: Pulmonary effort is normal.  Musculoskeletal: Normal range of motion.     Comments: Normal range of motion neck, back, arms and legs.  Mild diffuse tenderness of the posterior paracervical muscles.  Skin:    General: Skin is warm and dry.  Neurological:     Mental Status: He is alert and oriented to person, place, and time.     Cranial Nerves: No cranial nerve deficit.     Sensory: No sensory deficit.     Motor: No abnormal muscle tone.     Coordination: Coordination normal.     Comments: Grip strength both hands.  Mild light touch dysphasia of the left thumb and index finger.  Psychiatric:        Mood and Affect: Mood normal.        Behavior: Behavior normal.        Thought Content: Thought content normal.        Judgment: Judgment normal.      ED Treatments / Results  Labs (all labs ordered are listed, but only abnormal results are displayed) Labs Reviewed - No data to display  EKG None  Radiology Dg Shoulder Left  Result Date: 05/24/2018 CLINICAL DATA:  Popping sensation.  Pain.  Numbness and tingling. EXAM: LEFT SHOULDER - 2+ VIEW COMPARISON:  Chest x-ray 03/29/2018. FINDINGS: Acromioclavicular and glenohumeral degenerative change. No evidence of fracture or dislocation. Prior cervical spine fusion. IMPRESSION:  Acromioclavicular and glenohumeral degenerative change. No acute abnormality identified. Electronically Signed   By: Marcello Moores  Register   On: 05/24/2018 14:52    Procedures Procedures (including critical care time)  Medications Ordered in ED Medications - No data to display   Initial Impression / Assessment and Plan / ED Course  I have reviewed the triage vital signs and the nursing notes.  Pertinent labs & imaging results that were available during my care of the patient were reviewed by me and considered in my medical decision making (see chart for details).         Patient Vitals for the past 24 hrs:  BP Temp Temp src Pulse Resp SpO2 Height Weight  05/24/18 1739 - - - 84 - 100 % - -  05/24/18 1737 (!) 139/94 - - - - - - -  05/24/18 1420 - - - - - - 5' 10"  (1.778 m) 95.3 kg  05/24/18 1419 (!) 134/98 97.9 F (36.6 C) Oral 96 16 100 % - -    6:00 PM Reevaluation with update and discussion. After initial assessment and treatment, an updated evaluation reveals no change in clinical status.  5 discussed with the patient all questions were answered.Daleen Bo   Medical Decision Making: Neck pain with nonspecific numbness.  Doubt cervical myelopathy, fracture or spinal infection.  CRITICAL CARE-no Performed by: Daleen Bo   Nursing Notes Reviewed/ Care Coordinated Applicable Imaging Reviewed Interpretation of Laboratory Data incorporated into ED treatment  The patient appears reasonably screened and/or stabilized for discharge and I doubt any other medical condition or other [facility] requiring further screening, evaluation, or treatment in the ED at this time prior to discharge.  Plan: Home Medications-OTC analgesia if needed; Home Treatments-heat to affected area; return here if the recommended treatment, does not improve the symptoms; Recommended follow up-PCP or orthopedic follow-up, as needed     Final Clinical Impressions(s) / ED Diagnoses   Final diagnoses:    Cervical radiculopathy  Strain of neck muscle,  initial encounter    ED Discharge Orders         Ordered    predniSONE (DELTASONE) 20 MG tablet  2 times daily     05/24/18 1758    HYDROcodone-acetaminophen (NORCO) 5-325 MG tablet  Every 4 hours PRN     05/24/18 1758    diazepam (VALIUM) 5 MG tablet  Every 6 hours PRN     05/24/18 Joanie Coddington, MD 05/24/18 [phone]

## 2018-06-02 ENCOUNTER — Other Ambulatory Visit (HOSPITAL_COMMUNITY): Payer: Self-pay | Admitting: Orthopedic Surgery

## 2018-06-02 DIAGNOSIS — M545 Low back pain, unspecified: Secondary | ICD-10-CM

## 2018-06-07 ENCOUNTER — Ambulatory Visit: Payer: Self-pay | Admitting: Internal Medicine

## 2018-06-13 ENCOUNTER — Encounter (HOSPITAL_COMMUNITY): Payer: Self-pay | Admitting: *Deleted

## 2018-06-13 ENCOUNTER — Other Ambulatory Visit: Payer: Self-pay

## 2018-06-14 ENCOUNTER — Ambulatory Visit (HOSPITAL_COMMUNITY): Payer: No Typology Code available for payment source | Admitting: Physician Assistant

## 2018-06-14 ENCOUNTER — Encounter (HOSPITAL_COMMUNITY): Payer: Self-pay | Admitting: Certified Registered Nurse Anesthetist

## 2018-06-14 ENCOUNTER — Other Ambulatory Visit: Payer: Self-pay

## 2018-06-14 ENCOUNTER — Ambulatory Visit (HOSPITAL_COMMUNITY)
Admission: RE | Admit: 2018-06-14 | Discharge: 2018-06-14 | Disposition: A | Discharge status: Home / Self Care | Payer: No Typology Code available for payment source | Source: Ambulatory Visit | Attending: Orthopedic Surgery | Admitting: Orthopedic Surgery

## 2018-06-14 ENCOUNTER — Encounter (HOSPITAL_COMMUNITY): Admission: RE | Disposition: A | Discharge status: Home / Self Care | Payer: Self-pay | Source: Home / Self Care

## 2018-06-14 ENCOUNTER — Ambulatory Visit (HOSPITAL_COMMUNITY)
Admission: RE | Admit: 2018-06-14 | Discharge: 2018-06-14 | Disposition: A | Discharge status: Home / Self Care | Payer: No Typology Code available for payment source | Attending: Orthopedic Surgery | Admitting: Orthopedic Surgery

## 2018-06-14 DIAGNOSIS — M545 Low back pain, unspecified: Secondary | ICD-10-CM

## 2018-06-14 DIAGNOSIS — M48061 Spinal stenosis, lumbar region without neurogenic claudication: Secondary | ICD-10-CM | POA: Insufficient documentation

## 2018-06-14 DIAGNOSIS — M1990 Unspecified osteoarthritis, unspecified site: Secondary | ICD-10-CM | POA: Diagnosis not present

## 2018-06-14 DIAGNOSIS — G4730 Sleep apnea, unspecified: Secondary | ICD-10-CM | POA: Diagnosis not present

## 2018-06-14 DIAGNOSIS — J45909 Unspecified asthma, uncomplicated: Secondary | ICD-10-CM | POA: Insufficient documentation

## 2018-06-14 HISTORY — PX: RADIOLOGY WITH ANESTHESIA: SHX6223

## 2018-06-14 HISTORY — DX: Unspecified osteoarthritis, unspecified site: M19.90

## 2018-06-14 HISTORY — DX: Other intervertebral disc degeneration, lumbar region without mention of lumbar back pain or lower extremity pain: M51.369

## 2018-06-14 HISTORY — DX: Other intervertebral disc degeneration, lumbar region: M51.36

## 2018-06-14 HISTORY — DX: Personal history of other diseases of the digestive system: Z87.19

## 2018-06-14 HISTORY — DX: Sleep apnea, unspecified: G47.30

## 2018-06-14 SURGERY — MRI WITH ANESTHESIA
Anesthesia: General

## 2018-06-14 MED ORDER — ONDANSETRON HCL 4 MG/2ML IJ SOLN
INTRAMUSCULAR | Status: DC | PRN
  Administered 2018-06-14: 4 mg via INTRAVENOUS

## 2018-06-14 MED ORDER — LACTATED RINGERS IV SOLN
INTRAVENOUS | Status: DC
  Administered 2018-06-14: 08:00:00 via INTRAVENOUS

## 2018-06-14 MED ORDER — LIDOCAINE 2% (20 MG/ML) 5 ML SYRINGE
INTRAMUSCULAR | Status: DC | PRN
  Administered 2018-06-14: 60 mg via INTRAVENOUS

## 2018-06-14 MED ORDER — FENTANYL CITRATE (PF) 100 MCG/2ML IJ SOLN
INTRAMUSCULAR | Status: DC | PRN
  Administered 2018-06-14: 50 ug via INTRAVENOUS

## 2018-06-14 MED ORDER — DEXAMETHASONE SODIUM PHOSPHATE 10 MG/ML IJ SOLN
INTRAMUSCULAR | Status: DC | PRN
  Administered 2018-06-14: 10 mg via INTRAVENOUS

## 2018-06-14 MED ORDER — MIDAZOLAM HCL 5 MG/5ML IJ SOLN
INTRAMUSCULAR | Status: DC | PRN
  Administered 2018-06-14: 2 mg via INTRAVENOUS

## 2018-06-14 MED ORDER — PROPOFOL 10 MG/ML IV BOLUS
INTRAVENOUS | Status: DC | PRN
  Administered 2018-06-14: 200 mg via INTRAVENOUS

## 2018-06-14 NOTE — Anesthesia Preprocedure Evaluation (Signed)
Anesthesia Evaluation  Patient identified by MRN, date of birth, ID band Patient awake    Reviewed: Allergy & Precautions, NPO status , Patient's Chart, lab work & pertinent test results  Airway Mallampati: II  TM Distance: >3 FB Neck ROM: Full    Dental  (+) Dental Advisory Given   Pulmonary asthma , sleep apnea ,    breath sounds clear to auscultation       Cardiovascular negative cardio ROS   Rhythm:Regular Rate:Normal     Neuro/Psych  Headaches,    GI/Hepatic Neg liver ROS, hiatal hernia, GERD  ,  Endo/Other  negative endocrine ROS  Renal/GU negative Renal ROS     Musculoskeletal  (+) Arthritis ,   Abdominal   Peds  Hematology negative hematology ROS (+)   Anesthesia Other Findings   Reproductive/Obstetrics                             Anesthesia Physical Anesthesia Plan  ASA: II  Anesthesia Plan: General   Post-op Pain Management:    Induction: Intravenous  PONV Risk Score and Plan: 2 and Dexamethasone, Ondansetron and Treatment may vary due to age or medical condition  Airway Management Planned: LMA  Additional Equipment:   Intra-op Plan:   Post-operative Plan: Extubation in OR  Informed Consent: I have reviewed the patients History and Physical, chart, labs and discussed the procedure including the risks, benefits and alternatives for the proposed anesthesia with the patient or authorized representative who has indicated his/her understanding and acceptance.     Dental advisory given  Plan Discussed with: CRNA  Anesthesia Plan Comments:         Anesthesia Quick Evaluation

## 2018-06-14 NOTE — Transfer of Care (Signed)
Immediate Anesthesia Transfer of Care Note  Patient: Zachary Nash  Procedure(s) Performed: MRI LUMBER SPINE WITHOUT CONTRAST (N/A )  Patient Location: PACU  Anesthesia Type:General  Level of Consciousness: awake, alert  and oriented  Airway & Oxygen Therapy: Patient Spontanous Breathing and Patient connected to face mask oxygen  Post-op Assessment: Report given to RN and Post -op Vital signs reviewed and stable  Post vital signs: Reviewed and stable  Last Vitals:  Vitals Value Taken Time  BP 131/99 06/14/2018 11:40 AM  Temp 36.3 C 06/14/2018 11:40 AM  Pulse 96 06/14/2018 11:45 AM  Resp 12 06/14/2018 11:45 AM  SpO2 98 % 06/14/2018 11:45 AM  Vitals shown include unvalidated device data.  Last Pain:  Vitals:   06/14/18 0755  TempSrc:   PainSc: 7       Patients Stated Pain Goal: 3 (36/64/40 3474)  Complications: No apparent anesthesia complications

## 2018-06-15 ENCOUNTER — Encounter (HOSPITAL_COMMUNITY): Payer: Self-pay | Admitting: Radiology

## 2018-06-23 ENCOUNTER — Ambulatory Visit: Payer: Self-pay | Admitting: Internal Medicine

## 2018-06-27 ENCOUNTER — Telehealth: Payer: Self-pay

## 2018-06-27 NOTE — Telephone Encounter (Addendum)
I contacted the the who is currently on the wait list for a earlier new patient. I advised, Due to current COVID 19 pandemic, our office is severely reducing in office visits for at least the next 2 weeks, in order to minimize the risk to our patients and healthcare providers.   I stated we could complete a virtual visit with the pt and pt was agreeable to video visit on 06/28/18 at 10:00 am.    Pt understands that although there may be some limitations with this type of visit, we will take all precautions to reduce any security or privacy concerns.  Pt understands that this will be treated like an in office visit and we will file with pt's insurance, and there may be a patient responsible charge related to this service.  Pt's email is [email or web address]. Pt understands that the [employer] webex software must be downloaded and operational on the device pt plans to use for the visit.  I have reviewed pt's meds, allergies and pharmacy

## 2018-06-28 ENCOUNTER — Ambulatory Visit (INDEPENDENT_AMBULATORY_CARE_PROVIDER_SITE_OTHER): Payer: No Typology Code available for payment source | Admitting: Neurology

## 2018-06-28 ENCOUNTER — Encounter: Payer: Self-pay | Admitting: Neurology

## 2018-06-28 ENCOUNTER — Other Ambulatory Visit: Payer: Self-pay

## 2018-06-28 ENCOUNTER — Encounter

## 2018-06-28 DIAGNOSIS — G959 Disease of spinal cord, unspecified: Secondary | ICD-10-CM | POA: Diagnosis not present

## 2018-06-28 NOTE — Progress Notes (Signed)
Virtual Visit via Video Note  I connected with Zachary Nash on 06/28/18 at 10:00 AM EDT by a video enabled telemedicine application and verified that I am speaking with the correct person using two identifiers.   I discussed the limitations of evaluation and management by telemedicine and the availability of in person appointments. The patient expressed understanding and agreed to proceed.  History of Present Illness: Zachary Nash is a 48 year old right-handed white male with a history of onset of neck pain and discomfort down the arms in February 2019.  The patient underwent MRI of the cervical spine which revealed evidence of spinal stenosis at the C5-6 level with some evidence of compression of the spinal cord, particular to the left.  The patient underwent decompressive surgery in February 2019.  Following surgery however, the patient has had a significant reduction in mobility of the neck, particularly when turning the head to the right.  The patient after surgery began noting some weakness of the left leg, he notes that his toes on the left foot seem to pull up.  At nighttime in particular, the left leg will jerk.  He will have muscle cramps on the right leg.  He has developed some tingling sensations in the arms and legs of the last 3 to 4 months.  The patient notes when he turns his head to the right he will have some increased pain and tingling down the left arm.  If he looks up, he has tingling down both arms.  He has noted a change in bowel bladder control, he now has episodes of urinary incontinence, he has difficulty with constipation and now has to be on Linzess.  He has noted a change in his walking, he has a limping type gait on the left leg.  Uses a brace for the left foot to help his gait stability.  The patient is applying for disability currently.  The patient will fall on occasion.  He also reports some issues with confusion at times.  The patient has noted that steroid injections  seem to help some of his symptoms transiently.  The patient has been seen and followed previously by Dr. Lynann Bologna.   Observations/Objective: On the WebEx evaluation, the patient appears to have a normal sensorium, alert and cooperative.  The patient is responding to questions appropriately.  Speech is well enunciated, not aphasic or dysarthric.  Facial symmetry is present, the patient is able to protrude the tongue in the midline with good lateral tongue movement.  The patient has significant restriction of range of movement of the neck, lacks about 30 degrees of lateral rotation to the right, 15 degrees to the left.  The patient is able to ambulate but has a limping type gait on the left leg.  He has good finger-nose-finger and heel shin bilaterally.  He is able to take a few steps on the heels and the toes bilaterally.  Romberg is negative, no drift is seen.  Assessment and Plan: 1.  Paresthesias on all 4 extremities, gait disorder  2.  Cervical spine surgery, cervical decompression, cervical myelopathy  The patient reports ongoing paresthesias that have worsened over time and some increased difficulty with walking and bowel bladder control.  The patient reports symptoms that still could be consistent with a cervical myelopathy.  The patient has not had MRI of the cervical spine since surgery, he has undergone MRI of the brain, thoracic spine, and lumbar spine which do not show findings that would explain his current  symptoms.  The patient will be set up for a face-to-face evaluation within the next 2 weeks, he will undergo MRI of the cervical spine, he claims that he requires general anesthesia.  The patient will require further blood work.  Follow Up Instructions: Follow-up with me for face-to-face evaluation in 2 weeks.   MRI lumbar 06/14/18:  IMPRESSION: 1. Stable MRI appearance of the lumbar spine since 2019. 2. A small left subarticular annular fissure and disc protrusion at L5-S1 is more  conspicuous on axial images today but appears chronic. Query left L5 radiculitis. 3. No lumbar spinal or convincing lateral recess stenosis. Mild multifactorial L3 through L5 neural foraminal stenosis.  * MRI scan images were reviewed online. I agree with the written report.   MRI brain 05/24/17:  IMPRESSION: Normal brain MRI.  * MRI scan images were reviewed online. I agree with the written report.   MRI cervical 05/24/17:  IMPRESSION: 1. 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 The right vertebral artery origin and proximal V2 segment are incompletely visualized on this study. Brain MRI/MRA without contrast should be considered to assess for intracranial acute ischemia. CTA of the neck could provide further detail of the right vertebral artery, particularly its proximal portion, but this is not necessary for confirmation of the dissection. 2. 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 Severe left neural foraminal stenosis also at this level. 3. No acute abnormality or significant stenosis of the lumbar spine.   MRI thoracic 09/13/17:  Result Impression   1.Mild spinal canal narrowing from T10-T11 to T12-L1, most advanced at T11-T12. 2.No cord signal abnormality.      I discussed the assessment and treatment plan with the patient. The patient was provided an opportunity to ask questions and all were answered. The patient agreed with the plan and demonstrated an understanding of the instructions.   The patient was advised to call back or seek an in-person evaluation if the symptoms worsen or if the condition fails to improve as anticipated.  I provided 35 minutes of non-face-to-face time during this encounter.   Kathrynn Ducking, MD

## 2018-06-29 ENCOUNTER — Telehealth: Payer: Self-pay | Admitting: Neurology

## 2018-06-29 DIAGNOSIS — G959 Disease of spinal cord, unspecified: Secondary | ICD-10-CM

## 2018-06-29 NOTE — Telephone Encounter (Signed)
Noted. Watching for an opening.

## 2018-06-29 NOTE — Telephone Encounter (Signed)
[employer] auth: [employer] Ref # Zachary Nash on 06/29/18.Marland Kitchen  Zachary Nash with his insurance informed me that his insurance may not cover his MRI due to it is consider a pre existing condition. I called the patient and asked him has his insurance covered his other MRI's that he has had and he stated that they have covered it. I told him that I just wanted to inform him what his insurance had told me to just to let him know.    He also informed me that he will lose his insurance on July 25, 2018 at midnight and would like to get his MRI before then.   I lvm and a skype message to Carrielelia who scheduled the sedated MRI's at [facility] her that the patient would like to get his MRI scheduled as soon as possible because he will be loosing his insurance on 07/25/18.

## 2018-06-29 NOTE — Telephone Encounter (Signed)
Zachary Nash called me back and informed me that they are not scheduling MRI's right now until after 07/25/18 due to Covid-19. They are only scheduling urgent MRI's. If you believe this is a urgent MRI she stated that you would have to call the anesthesia department at [phone] or her manager April at [phone].

## 2018-06-29 NOTE — Telephone Encounter (Signed)
I will be seeing the patient face-to-face with sometime in the next couple weeks, if the examination confirms what the patient has indicated through the WebEx interview, we will need to get the MRI sooner rather than later.

## 2018-06-30 ENCOUNTER — Telehealth: Payer: Self-pay | Admitting: Neurology

## 2018-06-30 NOTE — Telephone Encounter (Signed)
Noted, thanks!

## 2018-06-30 NOTE — Telephone Encounter (Signed)
Pt could not get a return message to RN on mychart. He is accepting the face to face appt on 4/8 @ 9:30, ckin at 9.

## 2018-07-04 NOTE — Progress Notes (Signed)
Patient has been scheduled for face to face on 07/06/18. Patient's insurance will terminate the end of April.

## 2018-07-04 NOTE — Progress Notes (Signed)
I have read the note, and I agree with the clinical assessment and plan.  Charles K Willis   

## 2018-07-06 ENCOUNTER — Other Ambulatory Visit: Payer: Self-pay

## 2018-07-06 ENCOUNTER — Ambulatory Visit (INDEPENDENT_AMBULATORY_CARE_PROVIDER_SITE_OTHER): Payer: No Typology Code available for payment source | Admitting: Neurology

## 2018-07-06 ENCOUNTER — Encounter: Payer: Self-pay | Admitting: Neurology

## 2018-07-06 VITALS — BP 128/87 | HR 81 | Ht 70.5 in | Wt 214.0 lb

## 2018-07-06 DIAGNOSIS — R202 Paresthesia of skin: Secondary | ICD-10-CM | POA: Diagnosis not present

## 2018-07-06 DIAGNOSIS — G959 Disease of spinal cord, unspecified: Secondary | ICD-10-CM

## 2018-07-06 NOTE — Telephone Encounter (Signed)
After the clinical evaluation today, I am somewhat less concerned about this gentleman, the MRI of the cervical spine could probably wait 3 to 4 weeks, but still does need to be done.

## 2018-07-06 NOTE — Telephone Encounter (Signed)
Do you believe his MRI is urgent because per hospital rules they are scheduling sedated MRI's  out pass April 27 due to Covid-19.

## 2018-07-06 NOTE — Telephone Encounter (Signed)
I called and set up MRI study to be done at 9 AM on 12 July 2018.  They will call the patient to confirm.

## 2018-07-06 NOTE — Progress Notes (Signed)
Reason for visit: Gait disorder, numbness  Zachary Nash is a 48 y.o. male  History of present illness:  Zachary Nash is a 48 year old right-handed white male with a history of neck pain and discomfort in Zachary arms in February 2019.  Zachary Nash underwent spinal surgery at Zachary C5-6 level around that time, he claims that immediately following surgery that he had difficulty walking, he has had a progressive issue with left-sided numbness and weakness and his left foot is pulling up.  He is having problems with numbness in Zachary groin area and some difficulty controlling Zachary bladder.  He has had 2 episodes of incontinence.  He denies any recent falls.  He it will have muscle cramps in Zachary right leg.  He is brought in for further evaluation.  He has not had MRI evaluation of Zachary cervical spine since surgery, but he has had MRI of Zachary brain, thoracic spine, and lumbar spine without an identifiable cause of his functional changes.  He does report some left-sided low back pain.  Zachary Nash reports tingling down Zachary left greater than right arms with extension of Zachary neck.  Past Medical History:  Diagnosis Date   Anxiety    Arthritis    lower back, hands   Asthma    Bilateral inguinal hernia 05/29/2016   surgery to repair   Constipation    DDD (degenerative disc disease), lumbar    Fatty liver    GERD (gastroesophageal reflux disease)    History of hiatal hernia    History of kidney stones 2006   passed stone, no surgery   Pneumonia    PONV (postoperative nausea and vomiting)    Radicular pain of left lower extremity    Sleep apnea    Nash uses 2 L oxygen night - no cpap    Past Surgical History:  Procedure Laterality Date   ANTERIOR CERVICAL DECOMP/DISCECTOMY FUSION N/A 05/26/2017   Procedure: ANTERIOR CERVICAL DECOMPRESSION/DISCECTOMY FUSION TWO LEVELS Cervical five-six, Cervical six-seven;  Surgeon: Phylliss Bob, MD;  Location: [facility];  Service: Orthopedics;   Laterality: N/A;   BIOPSY  04/11/2018   Procedure: BIOPSY;  Surgeon: Otis Brace, MD;  [facility];  Service: Gastroenterology;;   CHOLECYSTECTOMY     ESOPHAGEAL DILATION N/A 03/24/2017   Procedure: ESOPHAGEAL DILATION;  Surgeon: Rogene Houston, MD;  [facility];  Service: Endoscopy;  Laterality: N/A;   ESOPHAGEAL MANOMETRY N/A 04/11/2018   Procedure: ESOPHAGEAL MANOMETRY (EM);  Surgeon: Otis Brace, MD;  [facility];  Service: Gastroenterology;  Laterality: N/A;   ESOPHAGOGASTRODUODENOSCOPY N/A 03/24/2017   Procedure: ESOPHAGOGASTRODUODENOSCOPY (EGD);  Surgeon: Rogene Houston, MD;  [facility];  Service: Endoscopy;  Laterality: N/A;  1155   ESOPHAGOGASTRODUODENOSCOPY (EGD) WITH PROPOFOL N/A 04/11/2018   Procedure: ESOPHAGOGASTRODUODENOSCOPY (EGD) WITH PROPOFOL;  Surgeon: Otis Brace, MD;  [facility];  Service: Gastroenterology;  Laterality: N/A;  mano probe to be placed during EGD   INGUINAL HERNIA REPAIR Bilateral 05/29/2016   Procedure: OPEN HERNIA REPAIR INGUINAL ADULT BILATERAL WITH insertion of MESH;  Surgeon: Fanny Skates, MD;  Location: [facility];  Service: General;  Laterality: Bilateral;   Lipoma removed     RADIOLOGY WITH ANESTHESIA N/A 06/14/2018   Procedure: MRI LUMBER SPINE WITHOUT CONTRAST;  Surgeon: Radiologist, Medication, MD;  Location: [facility];  Service: Radiology;  Laterality: N/A;   WISDOM TOOTH EXTRACTION      Family History  Problem Relation Age of Onset   Heart  failure Father    Heart attack Father    Ovarian cancer Mother    Arthritis Mother    Anxiety disorder Mother     Social history:  reports that he has never smoked. He has never used smokeless tobacco. He reports that he does not drink alcohol or use drugs.  Medications:  Prior to Admission medications   Medication Sig Start Date End Date Taking? Authorizing Provider  albuterol (PROVENTIL HFA;VENTOLIN HFA) 108 (90  BASE) MCG/ACT inhaler Inhale 2 puffs into Zachary lungs every 6 (six) hours as needed for wheezing or shortness of breath.    Yes [provider]  diazepam (VALIUM) 5 MG tablet Take 1 tablet (5 mg total) by mouth every 6 (six) hours as needed for muscle spasms (spasms). Nash taking differently: Take 5 mg by mouth at bedtime.  05/24/18  Yes Daleen Bo, MD  DULoxetine (CYMBALTA) 20 MG capsule Take 20 mg by mouth at bedtime.  02/03/18  Yes [provider]  gabapentin (NEURONTIN) 300 MG capsule Take 1 capsule (300 mg total) by mouth 3 (three) times daily. Nash taking differently: Take 300 mg by mouth 4 (four) times daily.  04/26/18  Yes Tanda Rockers, MD  HYDROcodone-acetaminophen (NORCO) 5-325 MG tablet Take 1 tablet by mouth every 4 (four) hours as needed for moderate pain. 05/24/18  Yes Daleen Bo, MD  naproxen sodium (ALEVE) 220 MG tablet Take 440 mg by mouth daily as needed (for pain or headache).    Yes [provider]  OXYGEN Inhale 2 L into Zachary lungs at bedtime.    Yes [provider]  pantoprazole (PROTONIX) 40 MG tablet Take 1 tablet (40 mg total) by mouth 2 (two) times daily before a meal. 10/06/17  Yes Setzer, Terri L, NP      Allergies  Allergen Reactions   Lidocaine Other (See Comments)    SYNCOPE "passed out"   Penicillins Anaphylaxis, Swelling and Other (See Comments)    Has Nash had a PCN reaction causing immediate rash, facial/tongue/throat swelling, SOB or lightheadedness with hypotension: yes Has Nash had a PCN reaction causing severe rash involving mucus membranes or skin necrosis: yes Has Nash had a PCN reaction that required hospitalization: no Has Nash had a PCN reaction occurring within Zachary last 10 years: no If all of Zachary above answers are "NO", then may proceed with Cephalosporin use.    ROS:  Out of a complete 14 system review of symptoms, Zachary Nash complains only of Zachary following symptoms, and all other  reviewed systems are negative.  Numbness, weakness, bladder control problems  Blood pressure 128/87, pulse 81, height 5' 10.5" (1.791 m), weight 214 lb (97.1 kg).  Physical Exam  General: Zachary Nash is alert and cooperative at Zachary time of Zachary examination.  Eyes: Pupils are equal, round, and reactive to light. Discs are flat bilaterally.  Neck: Zachary neck is supple, no carotid bruits are noted.  Respiratory: Zachary respiratory examination is clear.  Cardiovascular: Zachary cardiovascular examination reveals a regular rate and rhythm, no obvious murmurs or rubs are noted.  Neuromuscular: Zachary Nash has good range of movement Zachary cervical spine when turning Zachary head to Zachary left, lacks about 20 degrees of lateral rotation to Zachary right.  Skin: Extremities are without significant edema.  Neurologic Exam  Mental status: Zachary Nash is alert and oriented x 3 at Zachary time of Zachary examination. Zachary Nash has apparent normal recent and remote memory, with an apparently normal attention span and concentration  ability.  Cranial nerves: Facial symmetry is present. There is good sensation of Zachary face to pinprick and soft touch on Zachary right, decreased on Zachary left.  Zachary Nash splits Zachary midline with vibration sensation on forehead, decreased on Zachary left. Zachary strength of Zachary facial muscles and Zachary muscles to head turning and shoulder shrug are normal bilaterally. Speech is well enunciated, no aphasia or dysarthria is noted. Extraocular movements are full. Visual fields are full. Zachary tongue is midline, and Zachary Nash has symmetric elevation of Zachary soft palate. No obvious hearing deficits are noted.  Motor: Zachary motor testing reveals 5 over 5 strength of all 4 extremities. Good symmetric motor tone is noted throughout.  Sensory: Sensory testing is notable for decreased pinprick, soft touch, vibration sensation sensation on Zachary left arm and legs compared to Zachary right.  Position sensation is normal in Zachary hands  bilaterally, decreased on Zachary left foot as compared to Zachary right.  Coordination: Cerebellar testing reveals good finger-nose-finger and heel-to-shin bilaterally.  Gait and station: Gait is associated with a limping gait on Zachary left leg, tandem gait is slightly unsteady.  Zachary Nash walks with his left foot dorsiflexed. Romberg is negative. No drift is seen.  He is able to walk on Zachary heels and Zachary toes bilaterally.  Reflexes: Deep tendon reflexes are symmetric and normal bilaterally. Toes are downgoing bilaterally.   Assessment/Plan:  1.  Reports of left-sided numbness, weakness, gait disturbance, bladder control problems  2.  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  Zachary Nash reports numbness of Zachary left face, arm, leg and split Zachary midline and vibration sensation on Zachary forehead suggesting a nonorganic deficit.  MRI of Zachary brain has already been done and is normal.  Zachary Nash is apparently applying for disability.  MRI of Zachary cervical spine should be done to fully exclude spinal cord compression.  Zachary Nash claims that he will require general anesthesia for Zachary study.  Zachary MRI should be done in Zachary next 2 or 3 weeks.  Zachary Nash otherwise will follow-up here in about 4 months.  Jill Alexanders MD 07/06/2018 9:52 AM  [facility] [address] [place], [place] [address]  Phone [phone] Fax [phone]

## 2018-07-06 NOTE — Telephone Encounter (Signed)
See message from Raquel Sarna in regards to insurance ending.

## 2018-07-07 LAB — ANA W/REFLEX: Anti Nuclear Antibody (ANA): NEGATIVE

## 2018-07-07 LAB — SEDIMENTATION RATE: Sed Rate: 2 mm/hr (ref 0–15)

## 2018-07-07 LAB — HIV ANTIBODY (ROUTINE TESTING W REFLEX): HIV Screen 4th Generation wRfx: NONREACTIVE

## 2018-07-07 LAB — VITAMIN B12: Vitamin B-12: 720 pg/mL (ref 232–1245)

## 2018-07-07 LAB — B. BURGDORFI ANTIBODIES: Lyme IgG/IgM Ab: <0.91 {ISR} (ref 0.00–0.90)

## 2018-07-07 LAB — ANGIOTENSIN CONVERTING ENZYME: Angio Convert Enzyme: 58 U/L (ref 14–82)

## 2018-07-07 LAB — RPR: RPR Ser Ql: NONREACTIVE

## 2018-07-08 ENCOUNTER — Other Ambulatory Visit: Payer: Self-pay

## 2018-07-08 ENCOUNTER — Encounter (HOSPITAL_COMMUNITY): Payer: Self-pay | Admitting: *Deleted

## 2018-07-08 NOTE — Progress Notes (Signed)
Spoke with pt for pre-op call. Pt seen on 06/21/18 for MRI under anesthesia. Pt states nothing has changed with his medical and surgical history.    Coronavirus Screening  Have you experienced the following symptoms:  Cough no Fever (>100.75F)  no Runny nose no Sore throat no Difficulty breathing/shortness of breath  no  Have you or a family member traveled in the last 14 days and where? no

## 2018-07-11 ENCOUNTER — Encounter (HOSPITAL_COMMUNITY): Payer: Self-pay | Admitting: Anesthesiology

## 2018-07-11 MED ORDER — ALPRAZOLAM 0.5 MG PO TABS: ORAL_TABLET | ORAL | 0 refills | Status: DC

## 2018-07-11 NOTE — Telephone Encounter (Signed)
I will put another order for MRI without general anesthesia but with sedation.

## 2018-07-11 NOTE — Telephone Encounter (Signed)
Per anesthesia dept. 4-13 sent inbasket message to provider that due to new covid-19 restrictions, we are unable to schedule the pts mri at this time per the anesthesia dept//apait   The patient called me and informed me that his appointment has been canceled. I relayed that message that they are not scheduling MRI's due to covid-19.   I informed him that he could try [facility] with the open MRI machine and maybe take a valium to calm his nerves. Patient agreed to try that.

## 2018-07-11 NOTE — Addendum Note (Signed)
Addended by: Kathrynn Ducking on: 07/11/2018 06:20 PM   Modules accepted: Orders

## 2018-07-12 ENCOUNTER — Ambulatory Visit: Admit: 2018-07-12 | Payer: No Typology Code available for payment source

## 2018-07-12 SURGERY — MRI WITH ANESTHESIA
Anesthesia: General

## 2018-07-12 NOTE — Telephone Encounter (Signed)
Noted, thank you I faxed the order to [facility].

## 2018-07-12 NOTE — Telephone Encounter (Signed)
Pt called in and stated he had his MRI done and when he got home he says his fingers feel "like they are trying to rip out" Please advise.

## 2018-07-12 NOTE — Telephone Encounter (Signed)
Patient's MRI report from [facility] has been received and placed in MD's office. Results should be viewable within care everywhere.

## 2018-07-12 NOTE — Telephone Encounter (Signed)
Pt was notified Dr. Jannifer Franklin is out of the office and will return on 07/13/18. Pt was advised MD would review then.

## 2018-07-13 ENCOUNTER — Telehealth: Payer: Self-pay | Admitting: Neurology

## 2018-07-13 NOTE — Telephone Encounter (Signed)
I called the patient.  The MRI of the cervical spine is unremarkable, no evidence of spinal cord injury, no ongoing spinal stenosis.  The clinical examination suggested nonorganic sensory changes, the patient appears to be voluntarily dorsiflexing the left foot with walking.  Whenever the social distancing restrictions are lifted, I will get the patient set up for physical therapy.  Otherwise, the patient will follow-up here in September 2020.   MRI cervical July 12, 2018:  Impression:  1.  Degenerative disc disease most significant at C4-5 with disc osteophyte causing mild central canal stenosis and moderate bilateral neuroforaminal narrowing.  2.  C5-6 and C6-7 ACDF with well-maintained central canal and neuroforamina.   No abnormal cord signal is noted.

## 2018-07-22 ENCOUNTER — Other Ambulatory Visit: Payer: Self-pay

## 2018-07-22 ENCOUNTER — Other Ambulatory Visit: Payer: Self-pay | Admitting: Internal Medicine

## 2018-07-22 ENCOUNTER — Ambulatory Visit (HOSPITAL_COMMUNITY)
Admission: RE | Admit: 2018-07-22 | Discharge: 2018-07-22 | Disposition: A | Discharge status: Home / Self Care | Payer: No Typology Code available for payment source | Source: Ambulatory Visit | Attending: Internal Medicine | Admitting: Internal Medicine

## 2018-07-22 DIAGNOSIS — K4090 Unilateral inguinal hernia, without obstruction or gangrene, not specified as recurrent: Secondary | ICD-10-CM | POA: Insufficient documentation

## 2018-07-28 ENCOUNTER — Ambulatory Visit (INDEPENDENT_AMBULATORY_CARE_PROVIDER_SITE_OTHER): Payer: Self-pay | Admitting: Internal Medicine

## 2018-08-04 DIAGNOSIS — Z0271 Encounter for disability determination: Secondary | ICD-10-CM

## 2018-09-02 ENCOUNTER — Ambulatory Visit: Payer: No Typology Code available for payment source | Admitting: Neurology

## 2018-11-28 IMAGING — US US EXTREM  UP VENOUS*R*
1 series · 14 of 24 positions shown · non-contrast
Comparison: None.

CLINICAL DATA: Right wrist pain

EXAM:
RIGHT LOWER EXTREMITY VENOUS DUPLEX ULTRASOUND
TECHNIQUE: Doppler venous assessment of the left lower extremity deep venous
system was performed, including characterization of spectral flow,
compressibility, and phasicity.

[Series 1: us extrem up venous*right* · 0.08mm/px · 14 of 46 slices shown]
[im 1/46]
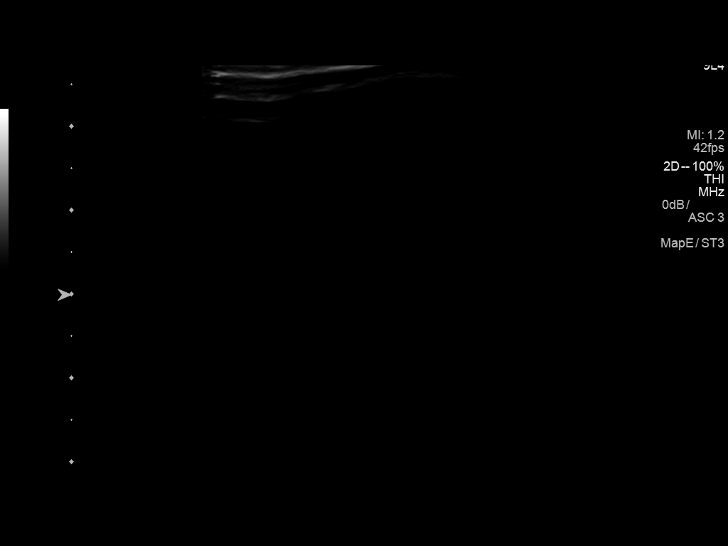
[im 4/46]
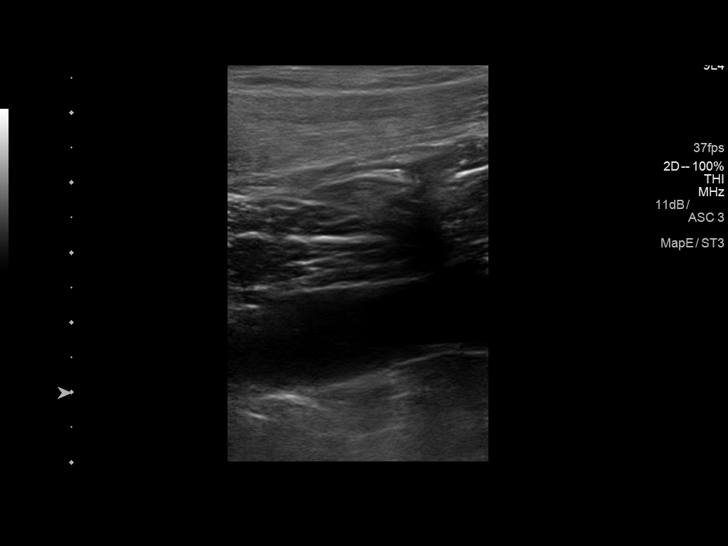
[im 8/46]
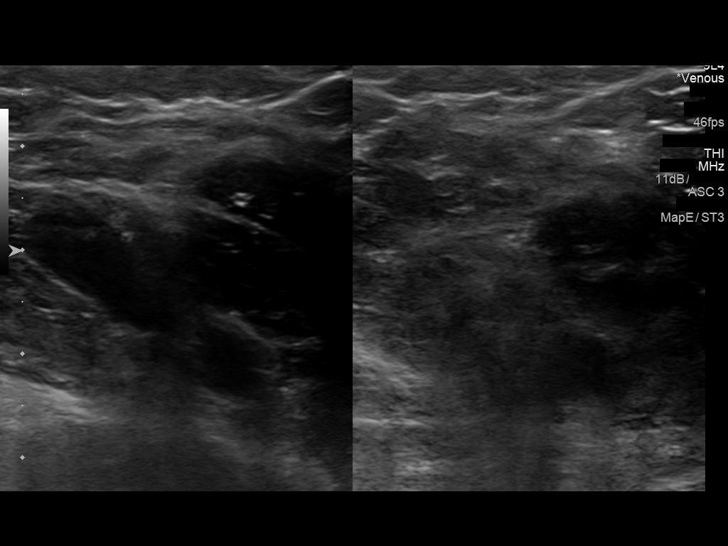
[im 12/46]
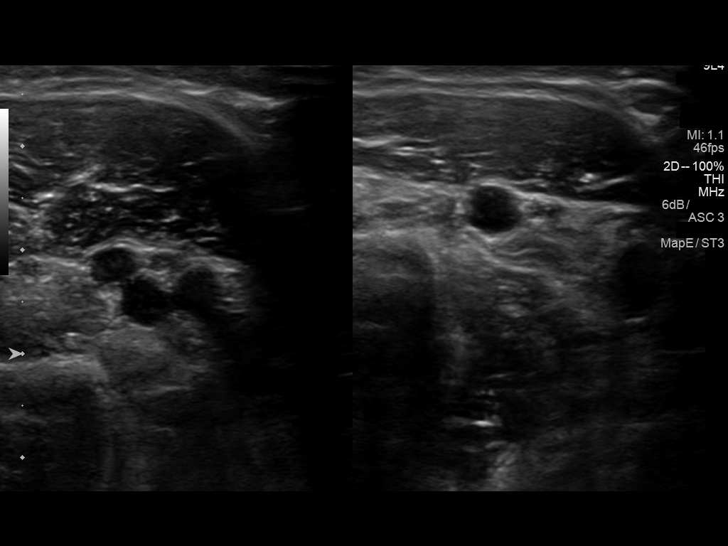
[im 14/46]
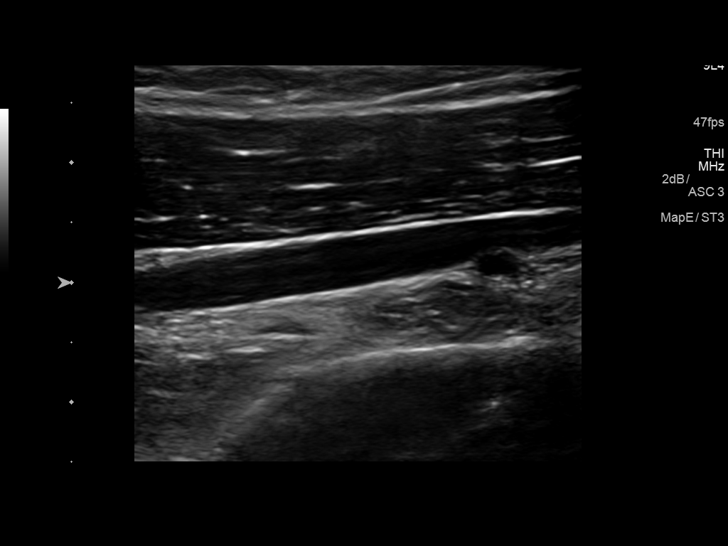
[im 18/46]
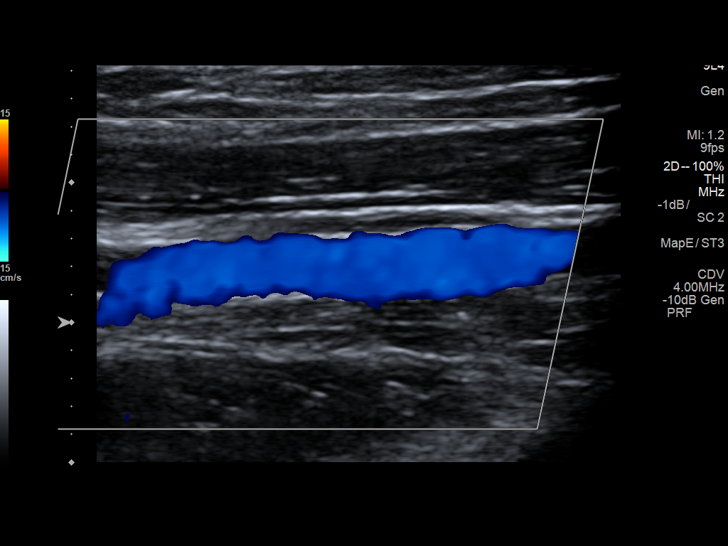
[im 22/46]
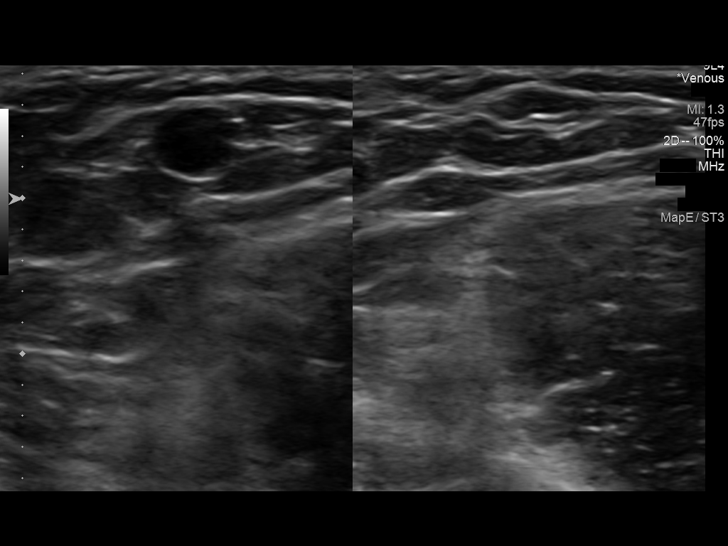
[im 24/46]
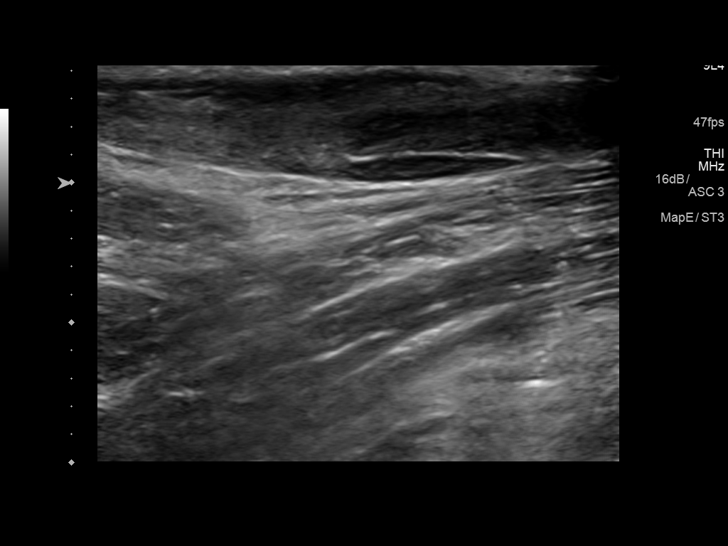
[im 28/46]
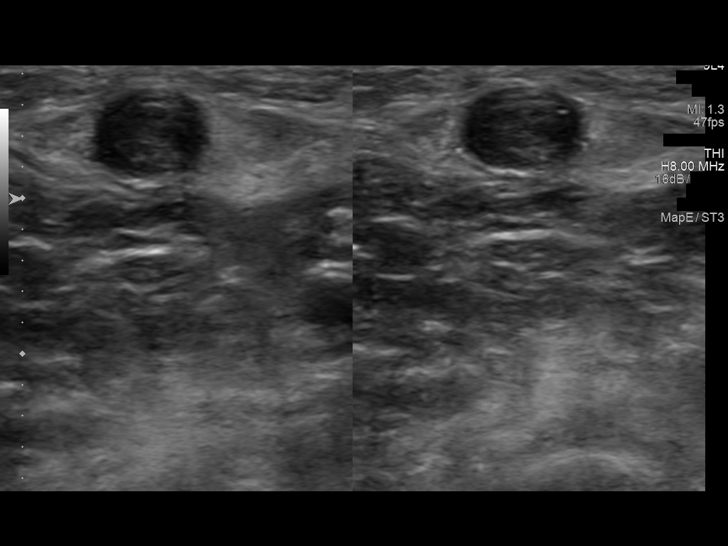
[im 32/46]
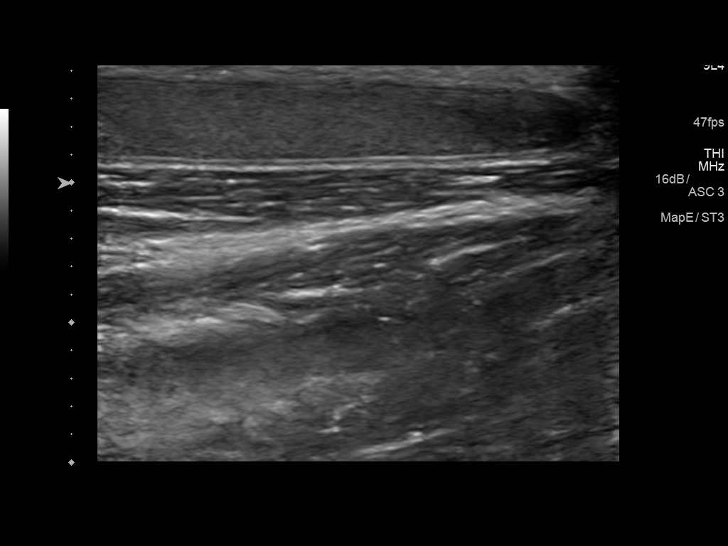
[im 36/46]
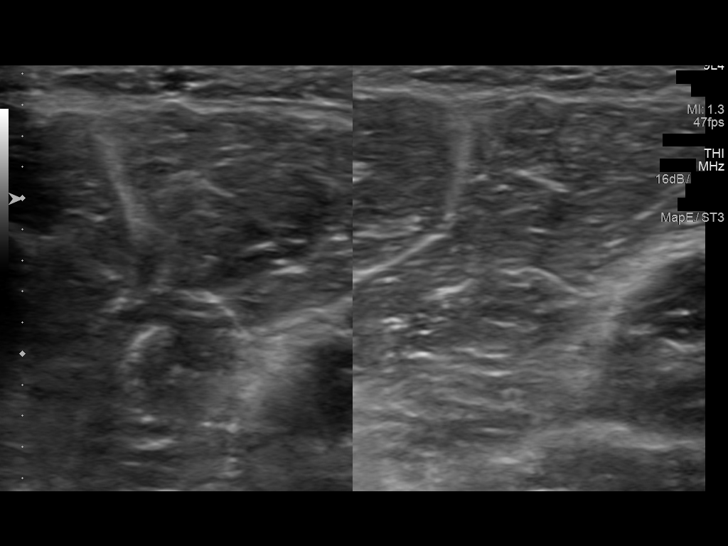
[im 38/46]
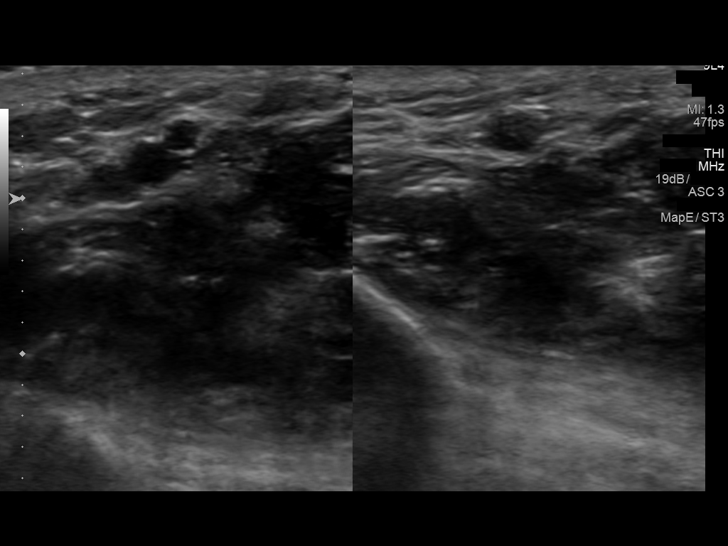
[im 42/46]
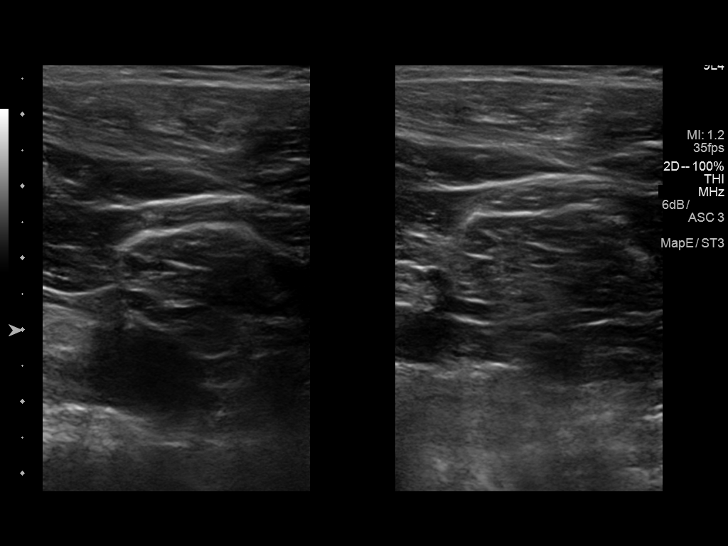
[im 46/46]
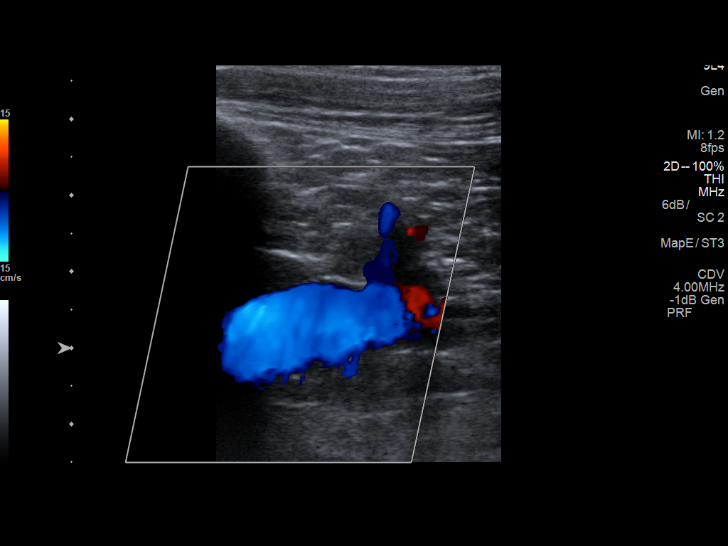

[14 of 24 positions shown; findings below may reference images not displayed]

FINDINGS: The right jugular vein could not be evaluated due to a neck brace.

The right subclavian vein is compressible and patent by color flow
and Doppler waveform imaging. The right axillary, brachial, radial,
and ulnar veins are all compressible. Doppler analysis demonstrates
a normal-appearing venous waveform.

The cephalic vein is thrombosed extending from the antecubital fossa
to the wrist. This is a superficial vein. The basilic vein is
compressible and patent.
IMPRESSION: There is no evidence of DVT in the right upper extremity

The study is positive for superficial vein thrombosis involving the
cephalic vein extending from the antecubital fossa to the wrist.

## 2018-12-21 ENCOUNTER — Telehealth: Payer: Self-pay

## 2018-12-21 NOTE — Telephone Encounter (Signed)
Pt's 12/23/2018 appt has been rescheduled to 01/02/2019.

## 2018-12-23 ENCOUNTER — Ambulatory Visit: Payer: No Typology Code available for payment source | Admitting: Neurology

## 2019-01-02 ENCOUNTER — Ambulatory Visit (INDEPENDENT_AMBULATORY_CARE_PROVIDER_SITE_OTHER): Payer: Self-pay | Admitting: Neurology

## 2019-01-02 ENCOUNTER — Other Ambulatory Visit: Payer: Self-pay

## 2019-01-02 ENCOUNTER — Encounter: Payer: Self-pay | Admitting: Neurology

## 2019-01-02 VITALS — BP 134/96 | HR 84 | Temp 98.0°F | Ht 70.5 in | Wt 219.0 lb

## 2019-01-02 DIAGNOSIS — R200 Anesthesia of skin: Secondary | ICD-10-CM

## 2019-01-02 DIAGNOSIS — M21372 Foot drop, left foot: Secondary | ICD-10-CM

## 2019-01-02 DIAGNOSIS — G4769 Other sleep related movement disorders: Secondary | ICD-10-CM

## 2019-01-02 NOTE — Progress Notes (Signed)
Reason for visit: Left hemisensory deficit, chronic back pain  Zachary Nash is an 48 y.o. male  History of present illness:  Zachary Nash is a 48 year old right-handed white male with a history of a cervical spine procedure.  Following this surgery, he has developed left-sided numbness and a left foot drop.  He has been seen and followed by Dr. Lynann Bologna.  He has continued to have his left-sided hemisensory deficit, MRI of the brain, cervical spine, thoracic spine and lumbar spine has not shown an etiology for this.  His clinical examination suggested a nonorganic sensory deficit.  He continues to have issues with a left-sided foot drop and he is wearing a brace on the left foot and he wears a back brace.  He claims that over the last 2 weeks his low back pain has significantly worsened with some pain down into the left thigh area.  He has sleep apnea and is on oxygen at night, he is not actively followed for his sleep apnea.  He now reports episodes of jerking and twitching at night, with increased snoring.  He remains out of work, he is applying for disability.  Past Medical History:  Diagnosis Date  . Anxiety   . Arthritis    lower back, hands  . Asthma   . Bilateral inguinal hernia 05/29/2016   surgery to repair  . Constipation   . DDD (degenerative disc disease), lumbar   . Fatty liver   . GERD (gastroesophageal reflux disease)   . History of hiatal hernia   . History of kidney stones 2006   passed stone, no surgery  . Pneumonia   . PONV (postoperative nausea and vomiting)    pt also states his O2 drops after surgery  . Radicular pain of left lower extremity   . Sleep apnea    patient uses 2 L oxygen night - no cpap    Past Surgical History:  Procedure Laterality Date  . ANTERIOR CERVICAL DECOMP/DISCECTOMY FUSION N/A 05/26/2017   Procedure: ANTERIOR CERVICAL DECOMPRESSION/DISCECTOMY FUSION TWO LEVELS Cervical five-six, Cervical six-seven;  Surgeon: Phylliss Bob, MD;   Location: [place];  Service: Orthopedics;  Laterality: N/A;  . BIOPSY  04/11/2018   Procedure: BIOPSY;  Surgeon: Otis Brace, MD;  [facility];  Service: Gastroenterology;;  . CHOLECYSTECTOMY    . ESOPHAGEAL DILATION N/A 03/24/2017   Procedure: ESOPHAGEAL DILATION;  Surgeon: Rogene Houston, MD;  [facility];  Service: Endoscopy;  Laterality: N/A;  . ESOPHAGEAL MANOMETRY N/A 04/11/2018   Procedure: ESOPHAGEAL MANOMETRY (EM);  Surgeon: Otis Brace, MD;  [facility];  Service: Gastroenterology;  Laterality: N/A;  . ESOPHAGOGASTRODUODENOSCOPY N/A 03/24/2017   Procedure: ESOPHAGOGASTRODUODENOSCOPY (EGD);  Surgeon: Rogene Houston, MD;  [facility];  Service: Endoscopy;  Laterality: N/A;  1155  . ESOPHAGOGASTRODUODENOSCOPY (EGD) WITH PROPOFOL N/A 04/11/2018   Procedure: ESOPHAGOGASTRODUODENOSCOPY (EGD) WITH PROPOFOL;  Surgeon: Otis Brace, MD;  [facility];  Service: Gastroenterology;  Laterality: N/A;  mano probe to be placed during EGD  . INGUINAL HERNIA REPAIR Bilateral 05/29/2016   Procedure: OPEN HERNIA REPAIR INGUINAL ADULT BILATERAL WITH insertion of MESH;  Surgeon: Fanny Skates, MD;  Location: [place];  Service: General;  Laterality: Bilateral;  . Lipoma removed    . RADIOLOGY WITH ANESTHESIA N/A 06/14/2018   Procedure: MRI LUMBER SPINE WITHOUT CONTRAST;  Surgeon: Radiologist, Medication, MD;  Location: [place];  Service: Radiology;  Laterality: N/A;  . WISDOM TOOTH EXTRACTION  Family History  Problem Relation Age of Onset  . Heart failure Father   . Heart attack Father   . Ovarian cancer Mother   . Arthritis Mother   . Anxiety disorder Mother     Social history:  reports that he has never smoked. He has never used smokeless tobacco. He reports that he does not drink alcohol or use drugs.    Allergies  Allergen Reactions  . Lidocaine Other (See Comments)    SYNCOPE "passed out"  . Penicillins Anaphylaxis,  Swelling and Other (See Comments)    Has patient had a PCN reaction causing immediate rash, facial/tongue/throat swelling, SOB or lightheadedness with hypotension: yes Has patient had a PCN reaction causing severe rash involving mucus membranes or skin necrosis: yes Has patient had a PCN reaction that required hospitalization: no Has patient had a PCN reaction occurring within the last 10 years: no If all of the above answers are "NO", then may proceed with Cephalosporin use.    Medications:  Prior to Admission medications   Medication Sig Start Date End Date Taking? Authorizing Provider  albuterol (PROVENTIL HFA;VENTOLIN HFA) 108 (90 BASE) MCG/ACT inhaler Inhale 2 puffs into the lungs every 6 (six) hours as needed for wheezing or shortness of breath.    Yes [provider]  ALPRAZolam Duanne Moron) 0.5 MG tablet Take 2 tablets approximately 45 minutes prior to the MRI study, take a third tablet if needed. 07/11/18  Yes Kathrynn Ducking, MD  diazepam (VALIUM) 5 MG tablet Take 1 tablet (5 mg total) by mouth every 6 (six) hours as needed for muscle spasms (spasms). Patient taking differently: Take 5 mg by mouth at bedtime.  05/24/18  Yes Daleen Bo, MD  DULoxetine (CYMBALTA) 20 MG capsule Take 20 mg by mouth at bedtime.  02/03/18  Yes [provider]  gabapentin (NEURONTIN) 300 MG capsule Take 1 capsule (300 mg total) by mouth 3 (three) times daily. Patient taking differently: Take 300 mg by mouth 4 (four) times daily.  04/26/18  Yes Tanda Rockers, MD  HYDROcodone-acetaminophen (NORCO) 5-325 MG tablet Take 1 tablet by mouth every 4 (four) hours as needed for moderate pain. 05/24/18  Yes Daleen Bo, MD  naproxen sodium (ALEVE) 220 MG tablet Take 440 mg by mouth daily as needed (for pain or headache).    Yes [provider]  OXYGEN Inhale 2 L into the lungs at bedtime.    Yes [provider]  pantoprazole (PROTONIX) 40 MG tablet Take 1 tablet (40 mg total) by  mouth 2 (two) times daily before a meal. 10/06/17  Yes Setzer, Terri L, NP    ROS:  Out of a complete 14 system review of symptoms, the patient complains only of the following symptoms, and all other reviewed systems are negative.  Numbness Snoring Back pain, left leg pain  Blood pressure (!) 134/96, pulse 84, temperature 98 F (36.7 C), temperature source Temporal, height 5' 10.5" (1.791 m), weight 219 lb (99.3 kg).  Physical Exam  General: The patient is alert and cooperative at the time of the examination.  Skin: No significant peripheral edema is noted.   Neurologic Exam  Mental status: The patient is alert and oriented x 3 at the time of the examination. The patient has apparent normal recent and remote memory, with an apparently normal attention span and concentration ability.   Cranial nerves: Facial symmetry is present. Speech is normal, no aphasia or dysarthria is noted. Extraocular movements are full. Visual fields are  full.  Motor: The patient has good strength in all 4 extremities.  Sensory examination: Soft touch sensation is decreased on the left face, arm, and leg  Coordination: The patient has good finger-nose-finger and heel-to-shin bilaterally.  Gait and station: The patient has a normal gait. Tandem gait is slightly unsteady.  Romberg is negative. No drift is seen.  Reflexes: Deep tendon reflexes are symmetric.   Assessment/Plan:  1.  Left hemisensory deficit, likely psychogenic  2.  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  Reported recent increase in low back pain, left leg pain  4.  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  He will be set up for nerve conduction studies on both legs and EMG on the left leg.  The patient is applying for  disability, he appears to have many somatic complaints and deficits that may be in part psychogenic.  Jill Alexanders MD 01/02/2019 12:51 PM  [facility] [address] [place], [place] [address]  Phone [phone] Fax [phone]

## 2019-01-05 ENCOUNTER — Other Ambulatory Visit: Payer: Self-pay

## 2019-01-05 ENCOUNTER — Ambulatory Visit (INDEPENDENT_AMBULATORY_CARE_PROVIDER_SITE_OTHER): Payer: Self-pay | Admitting: Neurology

## 2019-01-05 ENCOUNTER — Encounter: Payer: Self-pay | Admitting: Neurology

## 2019-01-05 VITALS — BP 126/84 | HR 92 | Temp 99.0°F | Ht 70.5 in | Wt 219.0 lb

## 2019-01-05 DIAGNOSIS — G959 Disease of spinal cord, unspecified: Secondary | ICD-10-CM

## 2019-01-05 DIAGNOSIS — G9520 Unspecified cord compression: Secondary | ICD-10-CM

## 2019-01-05 DIAGNOSIS — G4719 Other hypersomnia: Secondary | ICD-10-CM | POA: Insufficient documentation

## 2019-01-05 DIAGNOSIS — G4761 Periodic limb movement disorder: Secondary | ICD-10-CM | POA: Insufficient documentation

## 2019-01-05 DIAGNOSIS — G4769 Other sleep related movement disorders: Secondary | ICD-10-CM

## 2019-01-05 DIAGNOSIS — F5104 Psychophysiologic insomnia: Secondary | ICD-10-CM

## 2019-01-05 NOTE — Progress Notes (Signed)
SLEEP MEDICINE CLINIC    Provider:  Larey Seat, MD  Primary Care Physician:  Celene Squibb, MD [name] [address]     Referring Provider: Dr Jannifer Franklin, MD at [facility]         Chief Complaint according to patient   Patient presents with:    . New Patient (Initial Visit)     pt states that in [place] a year ago he was diagnosed with sleep apnea and they placed him on 2 L [place]. he states that he has jerking reactions at night and Dr Jannifer Franklin thought this could be related to apnea events not being treated      HISTORY OF PRESENT ILLNESS:  Zachary Nash is a 48 y.o. year old Caucasian ( [place])  male patient seen here upon  a referral on 01/05/2019 from Dr. Jannifer Franklin- to transfer sleep care . Chief concern according to patient : " My wife has noted I am jerking in my sleep, I don;t feel that I am sleeping but she noted me to sleep- attacks'. Zachary Nash underwent a sleep study just a year ago January 11, 2018 where he was referred by Dr. Nevada Crane.  The performing physician was Dr. [name], the sleep study was initiated at about 10 PM and ended at 4:40 AM sleep efficiency was extremely poor 38.9% of the recorded time which would equal a total sleep time of 156 minutes, not eating 3 hours of valid data.  Thereby the study has not been valid.  No mean heart rate established. No REM sleep apnea.    I have the pleasure of seeing Zachary Nash today, a right -handed Caucasian male with a possible sleep disorder.  He has a medical history of Anxiety, Arthritis, Asthma, Bilateral inguinal hernia (05/29/2016), oxygen dependent-  Constipation, DDD (degenerative disc disease), lumbar, Fatty liver, GERD (gastroesophageal reflux disease), History of hiatal hernia, History of kidney stones (2006), Pneumonia, PONV (postoperative nausea and vomiting), Radicular pain of left lower extremity, and Sleep apnea.     Family medical /sleep history: father, paternal uncles, cousins with OSA.     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 He  lives in a household with 2 persons. Family status is married , with adult children. Pets are not  present. Tobacco use; none .  ETOH use; none ,  Caffeine intake in form of Coffee( none ) Soda( many ) Tea (sweet , many ) and  energy drinks. 6 or more a day.  Regular exercise = none .  Very engaged in church, 3 times a week.     Sleep habits are as follows: The patient's dinner time is between 7.30 PM. The patient goes to bed at 11 PM and struggles to go to sleep- jerking movements, RLS, spinal cord compression. Fusion  C 6-7 , L 4-5. continues to sleep for 3-4 hours, wakes for 2-3  bathroom breaks, has incontinence- without sensing urine flow-  the first time at 1 AM.   The preferred sleep position is supine- due to back pain- , with the support of 2 pillows. " my pillow"  Dreams are reportedly frequent/vivid - sometimes night mares. Nocturnal myoclonus.   7.30 AM is the usual rise time. The patient wakes up spontaneously at 4.30- 6 AM .  He reports not feeling refreshed or restored in AM, with symptoms such as dry mouth ( very) , morning headaches 9 yes) ,  and residual fatigue.  Naps are taken frequently, non scheduled - 3-4 a day,  lasting from 30 to 60 minutes and are often less refreshing than nocturnal sleep.    Review of Systems: Out of a complete 14 system review, the patient complains of only the following symptoms, and all other reviewed systems are negative.:  Fatigue, sleepiness , snoring, fragmented sleep, Insomnia to initiate sleep -while on cymbalta gabapentin - 60 minutes.    How likely are you to doze in the following situations: 0 = not likely, 1 = slight chance, 2 = moderate chance, 3 = high chance   Sitting and Reading? 2  Watching Television? 3 Sitting inactive in a public place (theater or meeting)?1 As a passenger in a car for an hour without a break? 3 Lying down in the  afternoon when circumstances permit?3 Sitting and talking to someone?0 Sitting quietly after lunch without alcohol?2 In a car, while stopped for a few minutes in traffic?2   Total = 16/ 24 points - unsafe to drive.   FSS endorsed at n/a -/ 63 points.   Social History   Socioeconomic History  . Marital status: Married    Spouse name: Not on file  . Number of children: Not on file  . Years of education: Not on file  . Highest education level: Not on file  Occupational History  . Not on file  Social Needs  . Financial resource strain: Not on file  . Food insecurity    Worry: Not on file    Inability: Not on file  . Transportation needs    Medical: Not on file    Non-medical: Not on file  Tobacco Use  . Smoking status: Never Smoker  . Smokeless tobacco: Never Used  Substance and Sexual Activity  . Alcohol use: No  . Drug use: No  . Sexual activity: Not on file  Lifestyle  . Physical activity    Days per week: Not on file    Minutes per session: Not on file  . Stress: Not on file  Relationships  . Social Herbalist on phone: Not on file    Gets together: Not on file    Attends religious service: Not on file    Active member of club or organization: Not on file    Attends meetings of clubs or organizations: Not on file    Relationship status: Not on file  Other Topics Concern  . Not on file  Social History Narrative  . Not on file    Family History  Problem Relation Age of Onset  . Heart failure Father   . Heart attack Father   . Ovarian cancer Mother   . Arthritis Mother   . Anxiety disorder Mother     Past Medical History:  Diagnosis Date  . Anxiety   . Arthritis    lower back, hands  . Asthma   . Bilateral inguinal hernia 05/29/2016   surgery to repair  . Constipation   . DDD (degenerative disc disease), lumbar   . Fatty liver   . GERD (gastroesophageal reflux disease)   . History of hiatal hernia   . History of kidney stones 2006    passed stone, no surgery  . Pneumonia   . PONV (postoperative nausea and vomiting)    pt also states his O2 drops after surgery  . Radicular pain of left lower extremity   . Sleep apnea    patient uses 2 L  oxygen night - no cpap    Past Surgical History:  Procedure Laterality Date  . ANTERIOR CERVICAL DECOMP/DISCECTOMY FUSION N/A 05/26/2017   Procedure: ANTERIOR CERVICAL DECOMPRESSION/DISCECTOMY FUSION TWO LEVELS Cervical five-six, Cervical six-seven;  Surgeon: Phylliss Bob, MD;  Location: [facility];  Service: Orthopedics;  Laterality: N/A;  . BIOPSY  04/11/2018   Procedure: BIOPSY;  Surgeon: Otis Brace, MD;  Location: [facility];  Service: Gastroenterology;;  . CHOLECYSTECTOMY    . ESOPHAGEAL DILATION N/A 03/24/2017   Procedure: ESOPHAGEAL DILATION;  Surgeon: Rogene Houston, MD;  [facility]: [facility];  Service: Endoscopy;  Laterality: N/A;  . ESOPHAGEAL MANOMETRY N/A 04/11/2018   Procedure: ESOPHAGEAL MANOMETRY (EM);  Surgeon: Otis Brace, MD;  Location: [facility];  Service: Gastroenterology;  Laterality: N/A;  . ESOPHAGOGASTRODUODENOSCOPY N/A 03/24/2017   Procedure: ESOPHAGOGASTRODUODENOSCOPY (EGD);  Surgeon: Rogene Houston, MD;  [facility];  Service: Endoscopy;  Laterality: N/A;  1155  . ESOPHAGOGASTRODUODENOSCOPY (EGD) WITH PROPOFOL N/A 04/11/2018   Procedure: ESOPHAGOGASTRODUODENOSCOPY (EGD) WITH PROPOFOL;  Surgeon: Otis Brace, MD;  Location: [facility];  Service: Gastroenterology;  Laterality: N/A;  mano probe to be placed during EGD  . INGUINAL HERNIA REPAIR Bilateral 05/29/2016   Procedure: OPEN HERNIA REPAIR INGUINAL ADULT BILATERAL WITH insertion of MESH;  Surgeon: Fanny Skates, MD;  Location: [facility];  Service: General;  Laterality: Bilateral;  . Lipoma removed    . RADIOLOGY WITH ANESTHESIA N/A 06/14/2018   Procedure: MRI LUMBER SPINE WITHOUT CONTRAST;  Surgeon: Radiologist, Medication, MD;  Location: [facility];  Service: Radiology;   Laterality: N/A;  . WISDOM TOOTH EXTRACTION       Current Outpatient Medications on File Prior to Visit  Medication Sig Dispense Refill  . albuterol (PROVENTIL HFA;VENTOLIN HFA) 108 (90 BASE) MCG/ACT inhaler Inhale 2 puffs into the lungs every 6 (six) hours as needed for wheezing or shortness of breath.     . ALPRAZolam (XANAX) 0.5 MG tablet Take 2 tablets approximately 45 minutes prior to the MRI study, take a third tablet if needed. 3 tablet 0  . diazepam (VALIUM) 5 MG tablet Take 1 tablet (5 mg total) by mouth every 6 (six) hours as needed for muscle spasms (spasms). (Patient taking differently: Take 5 mg by mouth at bedtime. ) 15 tablet 0  . DULoxetine (CYMBALTA) 20 MG capsule Take 20 mg by mouth at bedtime.   2  . gabapentin (NEURONTIN) 300 MG capsule Take 1 capsule (300 mg total) by mouth 3 (three) times daily. (Patient taking differently: Take 300 mg by mouth 4 (four) times daily. ) 90 capsule 2  . HYDROcodone-acetaminophen (NORCO) 5-325 MG tablet Take 1 tablet by mouth every 4 (four) hours as needed for moderate pain. 15 tablet 0  . naproxen sodium (ALEVE) 220 MG tablet Take 440 mg by mouth daily as needed (for pain or headache).     . OXYGEN Inhale 2 L into the lungs at bedtime.     . pantoprazole (PROTONIX) 40 MG tablet Take 1 tablet (40 mg total) by mouth 2 (two) times daily before a meal. 180 tablet 3   No current facility-administered medications on file prior to visit.     Allergies  Allergen Reactions  . Lidocaine Other (See Comments)    SYNCOPE "passed out"  . Penicillins Anaphylaxis, Swelling and Other (See Comments)    Has patient had a PCN reaction causing immediate rash, facial/tongue/throat swelling, SOB or lightheadedness with hypotension: yes Has patient had a PCN reaction causing severe rash  involving mucus membranes or skin necrosis: yes Has patient had a PCN reaction that required hospitalization: no Has patient had a PCN reaction occurring within the last 10  years: no If all of the above answers are "NO", then may proceed with Cephalosporin use.    Physical exam:  Today's Vitals   01/05/19 1043  BP: 126/84  Pulse: 92  Temp: 99 F (37.2 C)  Weight: 219 lb (99.3 kg)  Height: 5' 10.5" (1.791 m)   Body mass index is 30.98 kg/m.   Wt Readings from Last 3 Encounters:  01/05/19 219 lb (99.3 kg)  01/02/19 219 lb (99.3 kg)  07/06/18 214 lb (97.1 kg)     Ht Readings from Last 3 Encounters:  01/05/19 5' 10.5" (1.791 m)  01/02/19 5' 10.5" (1.791 m)  07/06/18 5' 10.5" (1.791 m)      General: The patient is awake, alert and appears not in acute distress. The patient is well groomed. Head: Normocephalic, atraumatic. Neck is supple. Mallampati 3,  neck circumference:19 inches . Nasal airflow patent.   Retrognathia is seen.  Dental status: intact  Cardiovascular:  Regular rate and cardiac rhythm by pulse,  without distended neck veins. Respiratory: Lungs are clear to auscultation.  Skin:  Without evidence of ankle edema, or rash. Trunk: The patient's posture is erect.   Neurologic exam : The patient is awake and alert, oriented to place and time.   Memory subjective described as intact.  Attention span & concentration ability appears normal.  Speech is fluent,  without  dysarthria, dysphonia or aphasia.  Mood and affect are appropriate.   Cranial nerves: no loss of smell or taste reported  Pupils are equal and briskly reactive to light. Funduscopic exam deferred. EOM without nystagmus.  No Diplopia. Visual fields by finger perimetry are intact. Hearing was intact to soft voice and finger rubbing. Facial sensation intact to fine touch. Facial motor strength is symmetric and tongue and uvula move midline.  Neck ROM : rotation, tilt and flexion extension were normal for age and shoulder shrug was symmetrical.    Motor exam:  Symmetric bulk,and ROM. Biceps tone on he left elevated with mild cog-wheeling, left biceps is tense and almost  spastic, painful movement. Normal tone without cog wheeling, symmetric grip strength .   Sensory:  Fine touch, pinprick and vibration were intact in both feet, but he reports numbness in buttocks, wears AFO, toes are hammertoes. S 1 numbness between the toes. numbness in the left hand.  Proprioception tested in the upper extremities was normal.   Coordination: Rapid alternating movements in the fingers/hands were of reduced  speed. Tremor.  The Finger-to-nose maneuver was intact without evidence of ataxia, dysmetria. Pronatordrift.   Gait and station: Patient could  rise without bracing from a seated position, he  walked without assistive device.  Stance is of normal width/ base and the patient turned with 6 steps. Stooped posture. Back pain.  Toe and heel walk were deferred.  Deep tendon reflexes: in the  upper and lower extremities are symmetric and intact.  Babinski response was deferred .     After spending a total time of 45 minutes face to face and additional time for physical and neurologic examination, review of laboratory studies,  personal review of imaging studies, reports and results of other testing and review of referral information / records as far as provided in visit, I have established the following assessments:  1) we need to repeat a sleep study , EDS (  excessive daytime sleepiness) due to insomnia, due to pain.  2) I am unsure how he could have had AHI of zero in REM sleep, given the short total sleep time.  3) nocturia, incontinence, stool irregularity, impotence - all could be related to spinal cord damage? Would fit a myelopathy.    My Plan is to proceed with:  1) HST due to self pay status- he stated he has insurance by early November. I like to have an attended sleep study and see of he has hypoxemia.    2) I am concerned about hypoxemia in a patient without primary pulmonary disease. Will need to lok at the oxygen need- it is truely needed.    I would like to  thank Celene Squibb, MD  [address] [place],  [place] [address] for allowing me to meet with and to take care of this pleasant patient.   In short, Zachary Nash is presenting with a plan to follow up either personally or through our NP within 2-3  month.   CC: I will share my notes with Dr. Jannifer Franklin, MD   Electronically signed by: Larey Seat, MD 01/05/2019 11:17 AM  [facility] Board certified by The [employer] of Sleep Medicine and Diplomate of the [employer] of Sleep Medicine. Board certified In Neurology through the [place], Fellow of the [employer] of Neurology. Medical Director of [facility].

## 2019-01-05 NOTE — Patient Instructions (Signed)
Screening for Sleep Apnea  Sleep apnea is a condition in which breathing pauses or becomes shallow during sleep. Sleep apnea screening is a test to determine if you are at risk for sleep apnea. The test is easy and only takes a few minutes. Your health care provider may ask you to have this test in preparation for surgery or as part of a physical exam. What are the symptoms of sleep apnea? Common symptoms of sleep apnea include:  Snoring.  Restless sleep.  Daytime sleepiness.  Pauses in breathing.  Choking during sleep.  Irritability.  Forgetfulness.  Trouble thinking clearly.  Depression.  Personality changes. Most people with sleep apnea are not aware that they have it. Why should I get screened? Getting screened for sleep apnea can help:  Ensure your safety. It is important for your health care providers to know whether or not you have sleep apnea, especially if you are having surgery or have other long-term (chronic) health conditions.  Improve your health and allow you to get a better night's rest. Restful sleep can help you: ? Have more energy. ? Lose weight. ? Improve high blood pressure. ? Improve diabetes management. ? Prevent stroke. ? Prevent car accidents. How is screening done? Screening usually includes being asked a list of questions about your sleep quality. Some questions you may be asked include:  Do you snore?  Is your sleep restless?  Do you have daytime sleepiness?  Has a partner or spouse told you that you stop breathing during sleep?  Have you had trouble concentrating or memory loss? If your screening test is positive, you are at risk for the condition. Further testing may be needed to confirm a diagnosis of sleep apnea. Where to find more information You can find screening tools online or at your health care clinic. For more information about sleep apnea screening and healthy sleep, visit these websites:  Centers for Disease Control and  Prevention: LearningDermat[employer].pl  American Sleep Apnea Association: www.sleepapnea.org Contact a health care provider if:  You think that you may have sleep apnea. Summary  Sleep apnea screening can help determine if you are at risk for sleep apnea.  It is important for your health care providers to know whether or not you have sleep apnea, especially if you are having surgery or have other chronic health conditions.  You may be asked to take a screening test for sleep apnea in preparation for surgery or as part of a physical exam. This information is not intended to replace advice given to you by your health care provider. Make sure you discuss any questions you have with your health care provider. Document Released: 06/26/2016 Document Revised: 12/31/2017 Document Reviewed: 06/26/2016 Elsevier Patient Education  2020 [employer].

## 2019-01-09 ENCOUNTER — Institutional Professional Consult (permissible substitution): Payer: Medicaid Other | Admitting: Neurology

## 2019-01-18 ENCOUNTER — Telehealth: Payer: Self-pay | Admitting: Neurology

## 2019-01-18 ENCOUNTER — Ambulatory Visit (INDEPENDENT_AMBULATORY_CARE_PROVIDER_SITE_OTHER): Payer: Self-pay | Admitting: Neurology

## 2019-01-18 ENCOUNTER — Other Ambulatory Visit: Payer: Self-pay

## 2019-01-18 DIAGNOSIS — G4769 Other sleep related movement disorders: Secondary | ICD-10-CM

## 2019-01-18 DIAGNOSIS — R258 Other abnormal involuntary movements: Secondary | ICD-10-CM

## 2019-01-18 NOTE — Procedures (Signed)
    History:  Zachary Nash is a 48 year old gentleman with a history of sleep apnea.  He reports episodes of jerking and twitching at night and increased snoring.  He is being evaluated for that jerking.  This is a sleep deprived EEG.  No skull defects are noted.  Medications include albuterol, alprazolam, diazepam, Cymbalta, gabapentin, hydrocodone, Aleve, and Protonix.  EEG classification: Normal awake and drowsy  Description of the recording: The background rhythms of this recording consists of a fairly well modulated medium amplitude alpha rhythm of 8 Hz that is reactive to eye opening and closure. As the record progresses, the patient appears to remain in the waking state throughout the recording. Photic stimulation was performed, resulting in a bilateral and symmetric photic driving response. Hyperventilation was also performed, resulting in a minimal buildup of the background rhythm activities without significant slowing seen. Toward the end of the recording, the patient enters the drowsy state with slight symmetric slowing seen. The patient never enters stage II sleep. At no time during the recording does there appear to be evidence of spike or spike wave discharges or evidence of focal slowing. EKG monitor shows no evidence of cardiac rhythm abnormalities with a heart rate of 84.  Impression: This is a normal EEG recording in the waking and drowsy state. No evidence of ictal or interictal discharges are seen.

## 2019-01-18 NOTE — Telephone Encounter (Signed)
I called the patient.  The EEG study was unremarkable.  The patient is to have EMG nerve conduction study at some point in the future.  He is concerned about getting disability in the near future.

## 2019-02-13 DIAGNOSIS — Z0000 Encounter for general adult medical examination without abnormal findings: Secondary | ICD-10-CM | POA: Diagnosis not present

## 2019-02-13 DIAGNOSIS — Z1329 Encounter for screening for other suspected endocrine disorder: Secondary | ICD-10-CM | POA: Diagnosis not present

## 2019-02-25 ENCOUNTER — Other Ambulatory Visit: Payer: Self-pay

## 2019-02-25 ENCOUNTER — Ambulatory Visit (INDEPENDENT_AMBULATORY_CARE_PROVIDER_SITE_OTHER): Payer: BC Managed Care – PPO | Admitting: Neurology

## 2019-02-25 DIAGNOSIS — G4761 Periodic limb movement disorder: Secondary | ICD-10-CM

## 2019-02-25 DIAGNOSIS — G4731 Primary central sleep apnea: Secondary | ICD-10-CM | POA: Diagnosis not present

## 2019-02-25 DIAGNOSIS — G4719 Other hypersomnia: Secondary | ICD-10-CM

## 2019-02-25 DIAGNOSIS — G4769 Other sleep related movement disorders: Secondary | ICD-10-CM

## 2019-02-25 DIAGNOSIS — G9520 Unspecified cord compression: Secondary | ICD-10-CM

## 2019-02-25 DIAGNOSIS — G959 Disease of spinal cord, unspecified: Secondary | ICD-10-CM

## 2019-02-25 DIAGNOSIS — F5104 Psychophysiologic insomnia: Secondary | ICD-10-CM

## 2019-03-01 DIAGNOSIS — G959 Disease of spinal cord, unspecified: Secondary | ICD-10-CM | POA: Diagnosis not present

## 2019-03-01 DIAGNOSIS — Z0001 Encounter for general adult medical examination with abnormal findings: Secondary | ICD-10-CM | POA: Diagnosis not present

## 2019-03-01 DIAGNOSIS — R944 Abnormal results of kidney function studies: Secondary | ICD-10-CM | POA: Diagnosis not present

## 2019-03-01 DIAGNOSIS — R93813 Abnormal radiologic findings on diagnostic imaging of testicles, bilateral: Secondary | ICD-10-CM | POA: Diagnosis not present

## 2019-03-02 ENCOUNTER — Emergency Department (HOSPITAL_COMMUNITY): Payer: BC Managed Care – PPO

## 2019-03-02 ENCOUNTER — Other Ambulatory Visit: Payer: Self-pay

## 2019-03-02 ENCOUNTER — Encounter (HOSPITAL_COMMUNITY): Payer: Self-pay

## 2019-03-02 ENCOUNTER — Emergency Department (HOSPITAL_COMMUNITY)
Admission: EM | Admit: 2019-03-02 | Discharge: 2019-03-02 | Disposition: A | Discharge status: Home / Self Care | Payer: BC Managed Care – PPO | Attending: Emergency Medicine | Admitting: Emergency Medicine

## 2019-03-02 ENCOUNTER — Telehealth: Payer: Self-pay | Admitting: Neurology

## 2019-03-02 DIAGNOSIS — M4802 Spinal stenosis, cervical region: Secondary | ICD-10-CM | POA: Diagnosis not present

## 2019-03-02 DIAGNOSIS — M50221 Other cervical disc displacement at C4-C5 level: Secondary | ICD-10-CM | POA: Diagnosis not present

## 2019-03-02 DIAGNOSIS — M545 Low back pain: Secondary | ICD-10-CM | POA: Diagnosis not present

## 2019-03-02 DIAGNOSIS — Z79899 Other long term (current) drug therapy: Secondary | ICD-10-CM | POA: Insufficient documentation

## 2019-03-02 DIAGNOSIS — R202 Paresthesia of skin: Secondary | ICD-10-CM | POA: Insufficient documentation

## 2019-03-02 DIAGNOSIS — J45909 Unspecified asthma, uncomplicated: Secondary | ICD-10-CM | POA: Insufficient documentation

## 2019-03-02 DIAGNOSIS — M549 Dorsalgia, unspecified: Secondary | ICD-10-CM | POA: Insufficient documentation

## 2019-03-02 DIAGNOSIS — M542 Cervicalgia: Secondary | ICD-10-CM | POA: Insufficient documentation

## 2019-03-02 DIAGNOSIS — M546 Pain in thoracic spine: Secondary | ICD-10-CM | POA: Diagnosis not present

## 2019-03-02 LAB — BASIC METABOLIC PANEL
Anion gap: 11 (ref 5–15)
BUN: 13 mg/dL (ref 6–20)
CO2: 22 mmol/L (ref 22–32)
Calcium: 9.2 mg/dL (ref 8.9–10.3)
Chloride: 108 mmol/L (ref 98–111)
Creatinine, Ser: 1.39 mg/dL — ABNORMAL HIGH (ref 0.61–1.24)
GFR calc Af Amer: >60 mL/min (ref >60)
GFR calc non Af Amer: 60 mL/min — ABNORMAL LOW (ref >60)
Glucose, Bld: 107 mg/dL — ABNORMAL HIGH (ref 70–99)
Potassium: 3.7 mmol/L (ref 3.5–5.1)
Sodium: 141 mmol/L (ref 135–145)

## 2019-03-02 LAB — CBC
HCT: 46.2 % (ref 39.0–52.0)
Hemoglobin: 15.5 g/dL (ref 13.0–17.0)
MCH: 29.2 pg (ref 26.0–34.0)
MCHC: 33.5 g/dL (ref 30.0–36.0)
MCV: 87.2 fL (ref 80.0–100.0)
Platelets: 250 10*3/uL (ref 150–400)
RBC: 5.3 MIL/uL (ref 4.22–5.81)
RDW: 12.7 % (ref 11.5–15.5)
WBC: 5.5 10*3/uL (ref 4.0–10.5)
nRBC: 0 % (ref 0.0–0.2)

## 2019-03-02 MED ORDER — PREDNISONE 10 MG (21) PO TBPK: ORAL_TABLET | Freq: Every day | ORAL | 0 refills | Status: DC

## 2019-03-02 MED ORDER — DIAZEPAM 5 MG PO TABS: 5.0000 mg | ORAL_TABLET | Freq: Three times a day (TID) | ORAL | 0 refills | Status: DC | PRN

## 2019-03-02 MED ORDER — DIAZEPAM 5 MG PO TABS
5.0000 mg | ORAL_TABLET | Freq: Once | ORAL | Status: AC
  Administered 2019-03-02: 17:00:00 5 mg via ORAL
  Filled 2019-03-02: qty 1

## 2019-03-02 MED ORDER — LORAZEPAM 2 MG/ML IJ SOLN
1.0000 mg | Freq: Once | INTRAMUSCULAR | Status: AC | PRN
  Administered 2019-03-02: 13:00:00 1 mg via INTRAVENOUS
  Filled 2019-03-02: qty 1

## 2019-03-02 MED ORDER — METHYLPREDNISOLONE SODIUM SUCC 125 MG IJ SOLR
125.0000 mg | Freq: Once | INTRAMUSCULAR | Status: AC
  Administered 2019-03-02: 125 mg via INTRAVENOUS
  Filled 2019-03-02: qty 2

## 2019-03-02 MED ORDER — ONDANSETRON HCL 4 MG/2ML IJ SOLN: 4.0000 mg | Freq: Once | INTRAMUSCULAR | Status: DC

## 2019-03-02 MED ORDER — DIAZEPAM 5 MG PO TABS
5.0000 mg | ORAL_TABLET | Freq: Once | ORAL | Status: AC
  Administered 2019-03-02: 12:00:00 5 mg via ORAL
  Filled 2019-03-02: qty 1

## 2019-03-02 MED ORDER — HYDROMORPHONE HCL 1 MG/ML IJ SOLN: 1.0000 mg | Freq: Once | INTRAMUSCULAR | Status: DC

## 2019-03-02 NOTE — ED Notes (Signed)
Patient verbalizes understanding of discharge instructions. Opportunity for questioning and answers were provided. Armband removed by staff, pt discharged from ED.  

## 2019-03-02 NOTE — ED Triage Notes (Signed)
Pt arrives POV for eval of chronic back pain which is worse this week. Pt denies associated trauma/injury, hx of spinal surgery 2019. Endorses ongoing tingling to L side w/ pain radiating down L leg, pt reports these sx have been going on for over a year, but they have acutely worsened these last few days. Pt is other wise neuro intact in triage.

## 2019-03-02 NOTE — Telephone Encounter (Signed)
Pt states last night he experienced a shooting pain from his back down to his L leg. He states he is also experiencing numbness and tingling especially in both hands and is having to get help to get around. He would like to discuss with RN. Please advise.

## 2019-03-02 NOTE — Discharge Instructions (Addendum)
You were seen in the emergency department today for back/neck pain with left-sided numbness.  Your MRI showed degenerative changes with some irritation of nerves throughout your neck and back.  We spoke with Dr. Lynann Bologna, your orthopedic surgeon, he is in agreement with starting you on a steroid pack and giving you Valium to help with your symptoms with close follow-up in the office.  Please take the steroid pack starting tomorrow as prescribed.  Please take the Valium every 8 hours as needed for pain.  Valium is a benzodiazepine which is an potentially addictive medication that has sedating effects.  Do not take this medicine prior to driving or operating heavy machinery.  Do not take this medicine with other sedating medicines or with alcohol.  We have prescribed you new medication(s) today. Discuss the medications prescribed today with your pharmacist as they can have adverse effects and interactions with your other medicines including over the counter and prescribed medications. Seek medical evaluation if you start to experience new or abnormal symptoms after taking one of these medicines, seek care immediately if you start to experience difficulty breathing, feeling of your throat closing, facial swelling, or rash as these could be indications of a more serious allergic reaction  Please follow-up with Dr. Lindley Magnus keep a call in the office tomorrow morning to schedule a close follow-up appointment.  Return to the ER for new or worsening symptoms including but not limited to worsening pain, fever, inability to walk, weakness, progressing numbness, change in vision, or any other concerns.

## 2019-03-02 NOTE — ED Notes (Signed)
Pt states his O2 sats drop when he sleeps, wear 2L at home at night; pt placed on 2 L prior to going to MRI

## 2019-03-02 NOTE — ED Provider Notes (Signed)
[facility] EMERGENCY DEPARTMENT Provider Note   CSN: [id number] Arrival date & time: 03/02/19  1041     History   Chief Complaint Chief Complaint  Patient presents with   Back Pain    HPI Zachary Nash is a 48 y.o. male with a history of lumbar DDD, prior anterior cervical spine decomp/iscectomy fusion, prior spinal cord compression, prior LLE radicular pain, sleep apnea, GERD, and asthma who presents to the ED with complaints of neck/back pain & L sided paresthesias that began @ 20:00 last night. Patient states he was in his fairly normal state of health last night while seated at church when he started to feel his "spinal cord spasm." This seemed to improve and he was able to go up to the alter to kneel & pray however when he went to stand up he felt as if he couldn't. He reports pain to the neck, mid, & lower back as well as to the L hip area especially with movement/ambulation with paresthesias to the left upper/lower extremity, left chest, left abdomen, and to the R hand. He states paresthesias/numbness is constant, worse with movements, no other alleviating/aggravating factors. No facial involvement. Has not noted any weakness specifically. He think she may have had a few episodes of urinary incontinence over the past few weeks, none since last night. Prior C spine surgery. Denies weakness, saddle anesthesia, fever, chills, IV drug use, dysuria, or hx of cancer. Denies visual disturbance, headache, slurred speech, facial droop, chest pain, or dyspnea.     HPI  Past Medical History:  Diagnosis Date   Anxiety    Arthritis    lower back, hands   Asthma    Bilateral inguinal hernia 05/29/2016   surgery to repair   Constipation    DDD (degenerative disc disease), lumbar    Fatty liver    GERD (gastroesophageal reflux disease)    History of hiatal hernia    History of kidney stones 2006   passed stone, no surgery   Pneumonia    PONV  (postoperative nausea and vomiting)    pt also states his O2 drops after surgery   Radicular pain of left lower extremity    Sleep apnea    patient uses 2 L oxygen night - no cpap    Patient Active Problem List   Diagnosis Date Noted   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   Excessive daytime sleepiness 01/05/2019   PLMD (periodic limb movement disorder) 01/05/2019   Chronic insomnia 01/05/2019   Upper airway cough syndrome 03/31/2018   Cough variant asthma 03/28/2018   DOE (dyspnea on exertion) 03/09/2018   Nocturnal hypoxemia 03/09/2018   Spinal cord compression [facility])    Surgery, elective    Cervical myelopathy (HCC)    Syncope, vasovagal    Weakness    Generalized weakness 05/25/2017   Left sided numbness 05/24/2017   Right inguinal pain 04/20/2017   Dysphagia 03/24/2017   Bilateral inguinal hernia 05/29/2016   Post-traumatic headache 05/18/2013   Hyperglycemia 05/18/2013   Radicular pain of left lower extremity    Dizziness 05/16/2013    Past Surgical History:  Procedure Laterality Date   ANTERIOR CERVICAL DECOMP/DISCECTOMY FUSION N/A 05/26/2017   Procedure: ANTERIOR CERVICAL DECOMPRESSION/DISCECTOMY FUSION TWO LEVELS Cervical five-six, Cervical six-seven;  Surgeon: Phylliss Bob, MD;  Location: [facility];  Service: Orthopedics;  Laterality: N/A;   BIOPSY  04/11/2018   Procedure: BIOPSY;  Surgeon: Otis Brace, MD;  Location: [facility];  Service: Gastroenterology;;   CHOLECYSTECTOMY  ESOPHAGEAL DILATION N/A 03/24/2017   Procedure: ESOPHAGEAL DILATION;  Surgeon: Rogene Houston, MD;  Location: [facility];  Service: Endoscopy;  Laterality: N/A;   ESOPHAGEAL MANOMETRY N/A 04/11/2018   Procedure: ESOPHAGEAL MANOMETRY (EM);  Surgeon: Otis Brace, MD;  Location: [facility];  Service: Gastroenterology;  Laterality: N/A;   ESOPHAGOGASTRODUODENOSCOPY N/A 03/24/2017   Procedure: ESOPHAGOGASTRODUODENOSCOPY (EGD);  Surgeon: Rogene Houston, MD;  [facility]: [facility];  Service: Endoscopy;  Laterality: N/A;  1155   ESOPHAGOGASTRODUODENOSCOPY (EGD) WITH PROPOFOL N/A 04/11/2018   Procedure: ESOPHAGOGASTRODUODENOSCOPY (EGD) WITH PROPOFOL;  Surgeon: Otis Brace, MD;  Location: [facility];  Service: Gastroenterology;  Laterality: N/A;  mano probe to be placed during EGD   INGUINAL HERNIA REPAIR Bilateral 05/29/2016   Procedure: OPEN HERNIA REPAIR INGUINAL ADULT BILATERAL WITH insertion of MESH;  Surgeon: Fanny Skates, MD;  Location: [facility];  Service: General;  Laterality: Bilateral;   Lipoma removed     RADIOLOGY WITH ANESTHESIA N/A 06/14/2018   Procedure: MRI LUMBER SPINE WITHOUT CONTRAST;  Surgeon: Radiologist, Medication, MD;  Location: [facility];  Service: Radiology;  Laterality: N/A;   WISDOM TOOTH EXTRACTION          Home Medications    Prior to Admission medications   Medication Sig Start Date End Date Taking? Authorizing Provider  albuterol (PROVENTIL HFA;VENTOLIN HFA) 108 (90 BASE) MCG/ACT inhaler Inhale 2 puffs into the lungs every 6 (six) hours as needed for wheezing or shortness of breath.     [provider]  ALPRAZolam Duanne Moron) 0.5 MG tablet Take 2 tablets approximately 45 minutes prior to the MRI study, take a third tablet if needed. 07/11/18   Kathrynn Ducking, MD  diazepam (VALIUM) 5 MG tablet Take 1 tablet (5 mg total) by mouth every 6 (six) hours as needed for muscle spasms (spasms). Patient taking differently: Take 5 mg by mouth at bedtime.  05/24/18   Daleen Bo, MD  DULoxetine (CYMBALTA) 20 MG capsule Take 20 mg by mouth at bedtime.  02/03/18   [provider]  gabapentin (NEURONTIN) 300 MG capsule Take 1 capsule (300 mg total) by mouth 3 (three) times daily. Patient taking differently: Take 300 mg by mouth 4 (four) times daily.  04/26/18   Tanda Rockers, MD  HYDROcodone-acetaminophen (NORCO) 5-325 MG tablet Take 1 tablet by mouth every 4 (four) hours as needed for moderate pain.  05/24/18   Daleen Bo, MD  naproxen sodium (ALEVE) 220 MG tablet Take 440 mg by mouth daily as needed (for pain or headache).     [provider]  OXYGEN Inhale 2 L into the lungs at bedtime.     [provider]  pantoprazole (PROTONIX) 40 MG tablet Take 1 tablet (40 mg total) by mouth 2 (two) times daily before a meal. 10/06/17   Setzer, Rona Ravens, NP    Family History Family History  Problem Relation Age of Onset   Heart failure Father    Heart attack Father    Ovarian cancer Mother    Arthritis Mother    Anxiety disorder Mother     Social History Social History   Tobacco Use   Smoking status: Never Smoker   Smokeless tobacco: Never Used  Substance Use Topics   Alcohol use: No   Drug use: No     Allergies   Lidocaine and Penicillins   Review of Systems Review of Systems  Constitutional: Negative for chills and fever.  Respiratory: Negative for shortness of breath.  Cardiovascular: Negative for chest pain.  Gastrointestinal: Negative for abdominal pain and vomiting.  Genitourinary: Negative for dysuria, scrotal swelling and testicular pain.  Musculoskeletal: Positive for back pain and neck pain.  Neurological: Positive for numbness. Negative for dizziness, seizures, syncope, facial asymmetry, speech difficulty, weakness, light-headedness and headaches.       + for a few possible episodes of incontinence.   All other systems reviewed and are negative.    Physical Exam Updated Vital Signs BP 137/89 (BP Location: Right Arm)    Pulse 97    Temp 98.2 F (36.8 C) (Oral)    Resp 16    Ht 5' 11"  (1.803 m)    Wt 92.5 kg    SpO2 99%    BMI 28.45 kg/m   Physical Exam Vitals signs and nursing note reviewed.  Constitutional:      General: He is not in acute distress.    Appearance: He is well-developed. He is not toxic-appearing.  HENT:     Head: Normocephalic and atraumatic.  Eyes:     General:        Right eye: No discharge.        Left  eye: No discharge.     Conjunctiva/sclera: Conjunctivae normal.  Neck:     Musculoskeletal: Neck supple. Spinous process tenderness (lower portion of C spine) present.  Cardiovascular:     Rate and Rhythm: Normal rate and regular rhythm.  Pulmonary:     Effort: Pulmonary effort is normal. No respiratory distress.     Breath sounds: Normal breath sounds. No wheezing, rhonchi or rales.  Abdominal:     General: There is no distension.     Palpations: Abdomen is soft.     Tenderness: There is no abdominal tenderness. There is no guarding or rebound.  Musculoskeletal:     Comments: No obvious deformities, appreciable swelling, erythema, rashes, or open wounds.  Back: Midline spinal tenderness to the diffuse thoracic/lumbar region w/o point focal vertebral tenderness or palpable step off. Left lumbar paraspinal muscle tenderness which extends to the L gluteal region.  No point/focal tenderness to the LEs.   Skin:    General: Skin is warm and dry.     Findings: No rash.  Neurological:     Mental Status: He is alert.     Comments: Clear speech. CN III-XII grossly intact. Symmetric strength throughout extremity muscle groups x4. Subjective decreased sensation to the LUE/LLE, left chest/abdomen, & R hand- unable to discriminate sharp/dull. Antalgic gait, no obvious footdrop. Normal finger to nose. Negative pronator drift.   Psychiatric:        Behavior: Behavior normal.      ED Treatments / Results  Labs (all labs ordered are listed, but only abnormal results are displayed) Labs Reviewed  BASIC METABOLIC PANEL - Abnormal; Notable for the following components:      Result Value   Glucose, Bld 107 (*)    Creatinine, Ser 1.39 (*)    GFR calc non Af Amer 60 (*)    All other components within normal limits  CBC    EKG None  Radiology Mr Cervical Spine Wo Contrast  Result Date: 03/02/2019 CLINICAL DATA:  Chronic back pain.  Left body tingling. EXAM: MRI CERVICAL SPINE WITHOUT CONTRAST  TECHNIQUE: Multiplanar, multisequence MR imaging of the cervical spine was performed. No intravenous contrast was administered. COMPARISON:  05/24/2017 FINDINGS: Alignment: Straightening of the normal cervical lordosis. Vertebrae: Distant ACDF C5 through C7. Cord: No active cord compression. Small focus  of gliosis in the right side of the cord at C5-6. Mild cord deformity at the C7-T1 level due to stenosis but without convincing abnormal cord signal. Posterior Fossa, vertebral arteries, paraspinal tissues: Negative Disc levels: Foramen magnum is widely patent. C1-2 is normal. C2-3 shows normal appearance of the disc. Mild facet osteoarthritis but no stenosis. C3-4: Bulging of the disc. Bilateral uncovertebral prominence. No compressive canal stenosis. Mild foraminal narrowing on the left. C4-5: Bulging of the disc. Bilateral uncovertebral hypertrophy. Narrowing of the subarachnoid space but no compression of the cord. AP diameter of the canal in the midline measures 1 cm. Bilateral foraminal stenosis that could possibly affect either C5 nerve. C5 through C7: Previous ACDF has a good appearance. Sufficient patency of the canal and foramina presently. As noted above, there is mild gliosis in the right side of the cord at C5-6. C7-T1: Spinal stenosis because of bulging of the disc, bilateral uncovertebral hypertrophy right worse than left and bilateral facet degeneration and hypertrophy right worse than left. Subarachnoid space surrounding the cord is effaced and the cord is slightly indented. Bilateral foraminal stenosis right worse than left could compress either or both C8 nerves, particularly the right. Compared to the study of 05/24/2017, surgical changes from C5 through C7 have been performed in the interval. The degenerative changes at C7-T1 have worsened. IMPRESSION: Interval ACDF from C5 through C7 has a satisfactory appearance. Small focus of gliosis in the right side of the cord at C5-6. Worsening of  degenerative change at C7-T1 with spinal canal stenosis and mild deformity of the cord. Bilateral foraminal stenosis right worse than left likely to compress the C8 nerves, particularly the right. Spondylosis at C3-4 and C4-5, similar to the previous study. Foraminal narrowing which could possibly be symptomatic on the left at C3-4 and bilaterally at C4-5. Electronically Signed   By: Nelson Chimes M.D.   On: 03/02/2019 15:04   Mr Thoracic Spine Wo Contrast  Result Date: 03/02/2019 CLINICAL DATA:  Paresthesia. Back pain. Tingling of the left side with pain radiating down the left leg. EXAM: MRI THORACIC SPINE WITHOUT CONTRAST TECHNIQUE: Multiplanar, multisequence MR imaging of the thoracic spine was performed. No intravenous contrast was administered. COMPARISON:  None. FINDINGS: Alignment:  Normal Vertebrae: No fracture or primary bone lesion. Cord:  No cord compression or primary cord lesion. Paraspinal and other soft tissues: Negative Disc levels: No significant disc pathology from T1-2 through T12-L1. Minimal desiccation and a few tiny disc bulges but no herniation or compressive narrowing of the canal or foramina. Mild facet hypertrophy in the lower thoracic region without encroachment upon the neural structures or edematous change. IMPRESSION: No likely significant thoracic finding. Few minimal non-compressive disc bulges. Ordinary mild lower thoracic facet hypertrophy but without neural encroachment or edematous change. Electronically Signed   By: Nelson Chimes M.D.   On: 03/02/2019 15:11   Mr Lumbar Spine Wo Contrast  Result Date: 03/02/2019 CLINICAL DATA:  Chronic back pain.  Left leg pain. EXAM: MRI LUMBAR SPINE WITHOUT CONTRAST TECHNIQUE: Multiplanar, multisequence MR imaging of the lumbar spine was performed. No intravenous contrast was administered. COMPARISON:  06/14/2018 FINDINGS: Segmentation:  5 lumbar type vertebral bodies. Alignment:  Normal Vertebrae: No fracture or primary bone lesion.  Small endplate Schmorl's nodes T12-L1 and L1-2 without edema. Conus medullaris and cauda equina: Conus extends to the L1-2 level. Conus and cauda equina appear normal. Paraspinal and other soft tissues: Negative Disc levels: Discs within normal limits from L1-2 through L4-5. Mild facet osteoarthritis  at the L4-5 level. No stenosis at L3-4 or above. At L4-5, there is mild narrowing of the lateral recesses but no distinct neural compression. L5-S1: Desiccation and bulging of the disc. Small annular fissure in the left foraminal region. Mild facet osteoarthritis. No compressive canal or foraminal stenosis. Left L5 nerve irritation could possibly be associated with the annular fissure. No change since the previous study. IMPRESSION: No compressive pathology in the lumbar region. No visible change since the previous study. Facet osteoarthritis at L4-5 and L5-S1 could contribute to low back pain or referred facet syndrome pain. L5-S1 disc bulge with annular fissure in the left foraminal region, adjacent to the left L5 nerve. Nerve compression is not demonstrated, but nerve irritation could occur. Electronically Signed   By: Nelson Chimes M.D.   On: 03/02/2019 15:09    Procedures Procedures (including critical care time)  Medications Ordered in ED Medications  diazepam (VALIUM) tablet 5 mg (has no administration in time range)  LORazepam (ATIVAN) injection 1 mg (1 mg Intravenous Given 03/02/19 1314)  diazepam (VALIUM) tablet 5 mg (5 mg Oral Given 03/02/19 1210)     Prior Results Reviewed:   Cervical spine MRI WO contrast 05/24/18:  IMPRESSION: 1. 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 The right vertebral artery origin and proximal V2 segment are incompletely visualized on this study. Brain MRI/MRA without contrast should be considered to assess for intracranial acute ischemia. CTA of the neck could provide further detail of the right vertebral  artery, particularly its proximal portion, but this is not necessary for confirmation of the dissection. 2. 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 Severe left neural foraminal stenosis also at this level. 3. No acute abnormality or significant stenosis of the lumbar spine.  MRI L spine WO contrast: 06/14/2018:  IMPRESSION: 1. Stable MRI appearance of the lumbar spine since 2019. 2. A small left subarticular annular fissure and disc protrusion at L5-S1 is more conspicuous on axial images today but appears chronic. Query left L5 radiculitis. 3. No lumbar spinal or convincing lateral recess stenosis. Mild multifactorial L3 through L5 neural foraminal stenosis.   Initial Impression / Assessment and Plan / ED Course  I have reviewed the triage vital signs and the nursing notes.  Pertinent labs & imaging results that were available during my care of the patient were reviewed by me and considered in my medical decision making (see chart for details).   Patient presents to the ED with complaints of neck/back pain with LUE/LLE/L trunk & R hand paresthesias. Patient is nontoxic appearing, resting comfortably, vitals without significant abnormality. Diffusely midline spinal tenderness from the lower cervical region to lower lumbar spine without point/focal vertebral tenderness. Patient reports decreased L sided sensation to the LUE/LLE/L trunk & to the R hand- unable to discriminate sharp/dull. No obvious weakness appreciated on exam. No CN involvement, with his back pain and H&P do not suspect stroke. Will obtain spinal MRIs for further assessment. No fever/IVDU to indicate infectious process.     Patient has hx of sleep apnea, wears oxygen @ night, placed on 2L via [place] by nursing staff as patient was feeling a bit sleepy s/p benzodiazepines.   Labs reviewed fairly unremarkable, similar renal function compared to prior.  Imaging reviewed:    C-spine: Interval ACDF from C5 through C7 has a satisfactory appearance. Small focus of gliosis in the right side of the cord at  C5-6. Worsening of degenerative change at C7-T1 with spinal canal stenosis and mild deformity of the cord. Bilateral foraminal stenosis right worse than left likely to compress the C8 nerves, particularly the right. Spondylosis at C3-4 and C4-5, similar to the previous study. Foraminal narrowing which could possibly be symptomatic on the left at C3-4 and bilaterally at C4-5. T spine:  No likely significant thoracic finding. Few minimal non-compressive disc bulges. Ordinary mild lower thoracic facet hypertrophy but without neural encroachment or edematous change Lspine:  No compressive pathology in the lumbar region. No visible change since the previous study. Facet osteoarthritis at L4-5 and L5-S1 could contribute to low back pain or referred facet syndrome pain. L5-S1 disc bulge with annular fissure in the left foraminal region, adjacent to the left L5 nerve. Nerve compression is not demonstrated, but nerve irritation could occur.  15:30: Patient initially had some improvement s/p valium, however pain has returned, will re-dose.   15:45: CONSULT: Discussed findings & plan of care with orthopedic surgeon Dr. Lynann Bologna- reviewed H&P & MRI findings, states no surgical emergency or indication for admission based on presentation/imaging, recommends discharge home with follow up in clinic, in agreement with trial of steroid taper & benzos. Appreciate consultation.   Patient feeling improved s/p medications, appears appropriate for discharge home.  I discussed results, treatment plan, need for follow-up, and return precautions with the patient. Provided opportunity for questions, patient confirmed understanding and is in agreement with plan.   Findings and plan of care discussed with supervising physician Dr. Vanita Panda who is in agreement.   Final Clinical Impressions(s) / ED  Diagnoses   Final diagnoses:  Paresthesia  Neck pain  Acute back pain, unspecified back location, unspecified back pain laterality    ED Discharge Orders         Ordered    predniSONE (STERAPRED UNI-PAK 21 TAB) 10 MG (21) TBPK tablet  Daily     03/02/19 1642    diazepam (VALIUM) 5 MG tablet  Every 8 hours PRN     03/02/19 1642           Seleen Walter, [place] R, PA-C 03/02/19 1750    Carmin Muskrat, MD 03/03/19 [phone]

## 2019-03-02 NOTE — ED Notes (Signed)
[facility] contact number [phone]

## 2019-03-02 NOTE — ED Notes (Signed)
Patient transported to MRI 

## 2019-03-03 DIAGNOSIS — M542 Cervicalgia: Secondary | ICD-10-CM | POA: Diagnosis not present

## 2019-03-03 DIAGNOSIS — M533 Sacrococcygeal disorders, not elsewhere classified: Secondary | ICD-10-CM | POA: Diagnosis not present

## 2019-03-03 DIAGNOSIS — M5416 Radiculopathy, lumbar region: Secondary | ICD-10-CM | POA: Diagnosis not present

## 2019-03-03 DIAGNOSIS — M546 Pain in thoracic spine: Secondary | ICD-10-CM | POA: Diagnosis not present

## 2019-03-06 ENCOUNTER — Other Ambulatory Visit: Payer: Self-pay

## 2019-03-06 ENCOUNTER — Ambulatory Visit (INDEPENDENT_AMBULATORY_CARE_PROVIDER_SITE_OTHER): Payer: BC Managed Care – PPO | Admitting: Neurology

## 2019-03-06 ENCOUNTER — Encounter: Payer: Self-pay | Admitting: Neurology

## 2019-03-06 ENCOUNTER — Ambulatory Visit: Payer: BC Managed Care – PPO | Admitting: Neurology

## 2019-03-06 DIAGNOSIS — M21372 Foot drop, left foot: Secondary | ICD-10-CM

## 2019-03-06 DIAGNOSIS — M5410 Radiculopathy, site unspecified: Secondary | ICD-10-CM

## 2019-03-06 NOTE — Progress Notes (Addendum)
Patient comes in for EMG and nerve conduction study evaluation.  Nerve conductions does not show evidence of a generalized neuropathy, EMG of the left leg is unremarkable.  Over the last 5 days, the patient has had severe left back pain going down the legs on both sides, left greater than right, and pain going up the left side of the back to the neck and shoulder.  He has been placed on prednisone and Valium.  He has had recent MRI evaluations of the cervical, thoracic, and lumbar spines that have not shown any etiology for his pain.  Clinical examination shows evidence of what appears to be a nonorganic left hemisensory deficit affecting the face, arm, and leg.  It is felt that the alteration in gait that was noted on the last clinical examination is likely psychogenic in nature.  The patient is applying for disability.    Parkin    Nerve / Sites Muscle Latency Ref. Amplitude Ref. Rel Amp Segments Distance Velocity Ref. Area    ms ms mV mV %  cm m/s m/s mVms  R Peroneal - EDB     Ankle EDB 6.7 ?6.5 4.4 ?2.0 100 Ankle - EDB 9   12.7     Fib head EDB 12.9  4.7  105 Fib head - Ankle 30 48 ?44 14.7     Pop fossa EDB 15.1  4.4  95.2 Pop fossa - Fib head 10 45 ?44 13.5         Pop fossa - Ankle      L Peroneal - EDB     Ankle EDB 6.1 ?6.5 1.5 ?2.0 100 Ankle - EDB 9   6.0     Fib head EDB 12.7  1.6  106 Fib head - Ankle 30 46 ?44 8.8     Pop fossa EDB 14.9  0.5  32.8 Pop fossa - Fib head 10 44 ?44 2.1         Pop fossa - Ankle      R Tibial - AH     Ankle AH 5.7 ?5.8 8.7 ?4.0 100 Ankle - AH 9   26.1     Pop fossa AH 14.1  8.4  97.5 Pop fossa - Ankle 38 45 ?41 29.8  L Tibial - AH     Ankle AH 5.7 ?5.8 10.5 ?4.0 100 Ankle - AH 9   33.6     Pop fossa AH 14.9  12.4  118 Pop fossa - Ankle 38 41 ?41 45.3             SNC    Nerve / Sites Rec. Site Peak Lat Ref.  Amp Ref. Segments Distance    ms ms V V  cm  R Sural - Ankle (Calf)     Calf Ankle 4.1 ?4.4 24 ?6 Calf - Ankle 14  L Sural -  Ankle (Calf)     Calf Ankle 4.4 ?4.4 20 ?6 Calf - Ankle 14  R Superficial peroneal - Ankle     Lat leg Ankle 4.0 ?4.4 10 ?6 Lat leg - Ankle 14  L Superficial peroneal - Ankle     Lat leg Ankle 4.4 ?4.4 19 ?6 Lat leg - Ankle 14             F  Wave    Nerve F Lat Ref.   ms ms  R Tibial - AH 51.7 ?56.0  L Tibial - AH 53.9 ?56.0

## 2019-03-06 NOTE — Procedures (Signed)
     HISTORY:  Zachary Nash is a 48 year old gentleman with a history of a left hemisensory deficit and some discomfort and weakness of the left leg, the patient is being evaluated for this issue.  NERVE CONDUCTION STUDIES:  Nerve conduction studies were performed on both lower extremities. The distal motor latencies and motor amplitudes for the peroneal and posterior tibial nerves were within normal limits, with exception of a decreased motor amplitude for the left peroneal nerve. The nerve conduction velocities for these nerves were also normal. The sensory latencies for the peroneal and sural nerves were within normal limits. The F wave latencies for the posterior tibial nerves were within normal limits.   EMG STUDIES:  EMG study was performed on the left lower extremity:  The tibialis anterior muscle reveals 2 to 4K motor units with full recruitment. No fibrillations or positive waves were seen. The peroneus tertius muscle reveals 2 to 4K motor units with full recruitment. No fibrillations or positive waves were seen. The medial gastrocnemius muscle reveals 1 to 3K motor units with full recruitment. No fibrillations or positive waves were seen. The vastus lateralis muscle reveals 2 to 4K motor units with full recruitment. No fibrillations or positive waves were seen. The iliopsoas muscle reveals 2 to 4K motor units with full recruitment. No fibrillations or positive waves were seen. The biceps femoris muscle (long head) reveals 2 to 4K motor units with full recruitment. No fibrillations or positive waves were seen. The lumbosacral paraspinal muscles were tested at 3 levels, and revealed no abnormalities of insertional activity at all 3 levels tested. There was good relaxation.   IMPRESSION:  Nerve conduction studies done on both lower extremities were relatively unremarkable, no evidence of a generalized peripheral neuropathy is seen.  EMG evaluation of the left lower extremity was  normal, no evidence of an overlying lumbosacral radiculopathy is seen.  Jill Alexanders MD 03/06/2019 1:45 PM  [facility] [address] [place], [place] [address]  Phone [phone] Fax [phone]

## 2019-03-06 NOTE — Telephone Encounter (Signed)
No better time to have the test.

## 2019-03-06 NOTE — Telephone Encounter (Signed)
Dr Jannifer Franklin is here this week- I am forwarding this question to him. I think the patient may best be tested while having symptoms. CD

## 2019-03-06 NOTE — Progress Notes (Signed)
Please refer to EMG and nerve conduction procedure note.  

## 2019-03-07 ENCOUNTER — Other Ambulatory Visit: Payer: Self-pay | Admitting: Orthopedic Surgery

## 2019-03-07 DIAGNOSIS — M533 Sacrococcygeal disorders, not elsewhere classified: Secondary | ICD-10-CM

## 2019-03-08 NOTE — Addendum Note (Signed)
Addended by: Larey Seat on: 03/08/2019 05:19 PM   Modules accepted: Orders

## 2019-03-08 NOTE — Procedures (Signed)
PATIENT'S NAME:  Zachary Nash, Zachary Nash DOB:      1971/02/24      MR#:    [id number]     DATE OF RECORDING: 02/25/2019 MR REFERRING M.D.:  Margette Fast, MD Study Performed:   Baseline Polysomnogram for hypoxia patient HISTORY:  Patient was seen on 01-05-2019 to transfer sleep care. Excessive daytime sleepiness. History of spinal cord injury with myoclonus, RLS, and had 12-2017 a sleep study in [place], not followed by MD, only 156 minutes of sleep were recorded. The patient is using 2 liters of oxygen without CPAP.  Goal is to establish if the patient has apnea and if treatment may treat hypoxemia as well.  The patient endorsed the Epworth Sleepiness Scale at 16/24 points.   The patient's weight 219 pounds with a height of 70 (inches), resulting in a BMI of 30.9 kg/m2. The patient's neck circumference measured 19 inches.  CURRENT MEDICATIONS: Proventil, Xanax, Valium, Cymbalta, Neurontin, Norco, Aleve, Oxygen, Protonix   PROCEDURE:  This is a multichannel digital polysomnogram utilizing the Somnostar 11.2 system.  Electrodes and sensors were applied and monitored per AASM Specifications.   EEG, EOG, Chin and Limb EMG, were sampled at 200 Hz.  ECG, Snore and Nasal Pressure, Thermal Airflow, Respiratory Effort, CPAP Flow and Pressure, Oximetry was sampled at 50 Hz. Digital video and audio were recorded.      BASELINE STUDY: Lights Out was at 22:08 and Lights On at 05:07.  Total recording time (TRT) was 419.5 minutes, with a total sleep time (TST) of  278.5 minutes. The patient's sleep latency was 141.5 minutes.  REM latency was 141.5 minutes. The sleep efficiency was low at 66.4 %.     SLEEP ARCHITECTURE: WASO (Wake after sleep onset) was 72.5 minutes.  There were 33 minutes in Stage N1, 170 minutes Stage N2, 54 minutes Stage N3 and 21.5 minutes in Stage REM.  The percentage of Stage N1 was 11.8%, Stage N2 was 61.%, Stage N3 was 19.4% and Stage R (REM sleep) was 7.7%  RESPIRATORY ANALYSIS:  There were a  total of 55 respiratory events:  0 obstructive apneas, 5 central apneas and 5 mixed apneas with a total of 10 apneas and an apnea index (AI) of 2.2 /hour. There were 45 hypopneas with a hypopnea index of 9.7 /hour. The patient also had few respiratory event related arousals (RERAs).      The total APNEA/HYPOPNEA INDEX (AHI) was 11.8/hour.  2 events occurred in REM sleep and 96 events in NREM. The REM AHI was 5.6 /hour, versus a non-REM AHI of 12.4. The patient spent 73 minutes of total sleep time in the supine position and 206 minutes in non-supine.  The supine AHI was 30.4/h versus a non-supine AHI of 5.3.  OXYGEN SATURATION & C02:  The Wake baseline 02 saturation was 94%, with the lowest being 84%. Time spent below 89% saturation equaled 11 minutes.    PERIODIC LIMB MOVEMENTS:  The patient had a total of 19 Periodic Limb Movements.  The Periodic Limb Movement (PLM) index was 4.1 and the PLM Arousal index was 3.2/hour. The arousals were noted as: 81 were spontaneous, 15 were associated with PLMs, 31 were associated with respiratory events.    Audio and video analysis did show frequent movements, but no complex behaviors, phonations or vocalizations.   The patient took bathroom breaks. Snoring was noted. EKG was in keeping with normal sinus rhythm (NSR).   IMPRESSION:  1. Mild Complex Sleep Apnea (OSA) 2. Periodic Limb Movement Disorder (  PLMD) 3. Primary Snoring 4. No clinically relevant degree of hypoxemia    RECOMMENDATIONS:  1. Advise full-night, attended, CPAP titration study to optimize therapy.  2. Oxygen would not be clinically necessary. 3. PLMs are likely related to myelopathy, treatment with dopaminergic agonists to be considered, follow up with  Dr Jannifer Franklin.     I certify that I have reviewed the entire raw data recording prior to the issuance of this report in accordance with the Standards of Accreditation of the American Academy of Sleep Medicine (AASM)   Larey Seat, MD Diplomat, American Board of Psychiatry and Neurology  Diplomat, American Board of Sleep Medicine Market researcher, [name] [employer]

## 2019-03-09 ENCOUNTER — Telehealth: Payer: Self-pay | Admitting: Neurology

## 2019-03-09 NOTE — Telephone Encounter (Signed)
-----   Message from Larey Seat, MD sent at 03/08/2019  5:19 PM EST ----- IMPRESSION:   1. Mild Complex Sleep Apnea (OSA)  2. Periodic Limb Movement Disorder (PLMD)  3. Primary Snoring  4. No clinically relevant degree of hypoxemia    RECOMMENDATIONS:   1. Advise full-night, attended, CPAP titration study to optimize  therapy. Apnea is dependent on sleep position as well, avoid supine sleep if possible.  2. Oxygen would not be clinically necessary.  3. PLMs are likely related to myelopathy, treatment with  dopaminergic agonists to be considered, follow up with DR. Hall; Dr. Jannifer Franklin.

## 2019-03-09 NOTE — Telephone Encounter (Signed)
I called pt. I advised pt that Dr. Brett Fairy reviewed their sleep study results and found that has mils, complex sleep apnea and recommends that pt be treated with a cpap. Dr. Brett Fairy recommends that pt return for a repeat sleep study in order to properly titrate the cpap and ensure a good mask fit. Pt is agreeable to returning for a titration study. I advised pt that our sleep lab will file with pt's insurance and call pt to schedule the sleep study when we hear back from the pt's insurance regarding coverage of this sleep study. Pt verbalized understanding of results. Pt had no questions at this time but was encouraged to call back if questions arise.

## 2019-03-10 ENCOUNTER — Ambulatory Visit (INDEPENDENT_AMBULATORY_CARE_PROVIDER_SITE_OTHER)
Admission: RE | Admit: 2019-03-10 | Discharge: 2019-03-10 | Disposition: A | Discharge status: Home / Self Care | Payer: BC Managed Care – PPO | Source: Ambulatory Visit

## 2019-03-10 DIAGNOSIS — M545 Low back pain, unspecified: Secondary | ICD-10-CM

## 2019-03-10 DIAGNOSIS — M542 Cervicalgia: Secondary | ICD-10-CM

## 2019-03-10 NOTE — Discharge Instructions (Addendum)
Given your MRI results, I recommend follow up with neurology for further evaluation. Otherwise, please follow up in person for further evaluation needed.  If developing numbness to the inner thighs, loss of bladder or bowel control, loss of grip strength, go to the emergency department for further evaluation needed.

## 2019-03-10 NOTE — ED Provider Notes (Signed)
Virtual Visit via Video Note:  Zachary Nash  initiated request for Telemedicine visit with [facility] team. I connected with Zachary Nash  on 03/10/2019 at 11:08 AM  for a synchronized telemedicine visit using a video enabled HIPPA compliant telemedicine application. I verified that I am speaking with Zachary Nash  using two identifiers. Zachary Doland Jodell Cipro, PA-C  was physically located in a [facility] Urgent care site and Zachary Nash was located at a different location.   The limitations of evaluation and management by telemedicine as well as the availability of in-person appointments were discussed. Patient was informed that he  may incur a bill ( including co-pay) for this virtual visit encounter. Zachary Nash  expressed understanding and gave verbal consent to proceed with virtual visit.     History of Present Illness:Zachary Nash  is a 48 y.o. male presents with increased pain and paresthesias after being seen at the ED 03/02/2019. At the time, MRI showed bulging discs, spinal stenosis. Was put on prednisone and benzo for symptomatic management with follow up to orthopedics. Patient states symptoms worsened yesterday with numbness/tingligng down the upper extremity. Denies loss of grip strength. Denies saddle anesthesia, loss of bladder or bowel control.  Past Medical History:  Diagnosis Date  . Anxiety   . Arthritis    lower back, hands  . Asthma   . Bilateral inguinal hernia 05/29/2016   surgery to repair  . Constipation   . DDD (degenerative disc disease), lumbar   . Fatty liver   . GERD (gastroesophageal reflux disease)   . History of hiatal hernia   . History of kidney stones 2006   passed stone, no surgery  . Pneumonia   . PONV (postoperative nausea and vomiting)    pt also states his O2 drops after surgery  . Radicular pain of left lower extremity   . Sleep apnea    patient uses 2 L oxygen night - no cpap    Allergies  Allergen Reactions  .  Dilaudid [Hydromorphone Hcl] Anaphylaxis  . Lidocaine Other (See Comments)    SYNCOPE "passed out"  . Penicillins Anaphylaxis, Swelling and Other (See Comments)    Has patient had a PCN reaction causing immediate rash, facial/tongue/throat swelling, SOB or lightheadedness with hypotension: yes Has patient had a PCN reaction causing severe rash involving mucus membranes or skin necrosis: yes Has patient had a PCN reaction that required hospitalization: no Has patient had a PCN reaction occurring within the last 10 years: no If all of the above answers are "NO", then may proceed with Cephalosporin use.        Observations/Objective: .General: Well appearing, nontoxic, no acute distress. Sitting comfortably. Head: Normocephalic, atraumatic Eye: No conjunctival injection, eyelid swelling. EOMI ENT: Mucus membranes moist, no lip cracking. No obvious nasal drainage. Pulm: Speaking in full sentences without difficulty. Normal effort. No respiratory distress, accessory muscle use. Neuro: Normal mental status. Alert and oriented x 3.   Assessment and Plan: Given significant findings to MRI with worsening symptoms, discussed will at least need in person evaluation. However, given patient already established with neurology, discussed attempting follow up with neurology first. If unable to make appointment, to follow up with in clinic for assessment. Strict return precautions given. Patient expresses understanding and agrees to plan.  Follow Up Instructions:    I discussed the assessment and treatment plan with the patient. The patient was provided an opportunity to ask questions and all were  answered. The patient agreed with the plan and demonstrated an understanding of the instructions.   The patient was advised to call back or seek an in-person evaluation if the symptoms worsen or if the condition fails to improve as anticipated.  I provided 10 minutes of non-face-to-face time during this  encounter.    Zachary Edwards, PA-C  03/10/2019 11:08 AM        Zachary Edwards, PA-C 03/10/19 1227

## 2019-03-15 ENCOUNTER — Ambulatory Visit
Admission: RE | Admit: 2019-03-15 | Discharge: 2019-03-15 | Disposition: A | Discharge status: Home / Self Care | Payer: BC Managed Care – PPO | Source: Ambulatory Visit | Attending: Orthopedic Surgery | Admitting: Orthopedic Surgery

## 2019-03-15 DIAGNOSIS — M533 Sacrococcygeal disorders, not elsewhere classified: Secondary | ICD-10-CM

## 2019-03-15 DIAGNOSIS — R52 Pain, unspecified: Secondary | ICD-10-CM | POA: Diagnosis not present

## 2019-03-15 DIAGNOSIS — M549 Dorsalgia, unspecified: Secondary | ICD-10-CM | POA: Diagnosis not present

## 2019-03-15 DIAGNOSIS — M79605 Pain in left leg: Secondary | ICD-10-CM | POA: Diagnosis not present

## 2019-03-22 ENCOUNTER — Other Ambulatory Visit: Payer: Self-pay

## 2019-03-22 ENCOUNTER — Telehealth: Payer: BC Managed Care – PPO

## 2019-03-22 ENCOUNTER — Emergency Department (HOSPITAL_COMMUNITY)
Admission: EM | Admit: 2019-03-22 | Discharge: 2019-03-23 | Disposition: A | Discharge status: Home / Self Care | Payer: BC Managed Care – PPO | Attending: Emergency Medicine | Admitting: Emergency Medicine

## 2019-03-22 ENCOUNTER — Encounter (HOSPITAL_COMMUNITY): Payer: Self-pay | Admitting: Emergency Medicine

## 2019-03-22 DIAGNOSIS — M5442 Lumbago with sciatica, left side: Secondary | ICD-10-CM | POA: Insufficient documentation

## 2019-03-22 DIAGNOSIS — Z79899 Other long term (current) drug therapy: Secondary | ICD-10-CM | POA: Insufficient documentation

## 2019-03-22 DIAGNOSIS — M545 Low back pain: Secondary | ICD-10-CM | POA: Diagnosis present

## 2019-03-22 DIAGNOSIS — R531 Weakness: Secondary | ICD-10-CM | POA: Diagnosis not present

## 2019-03-22 DIAGNOSIS — R200 Anesthesia of skin: Secondary | ICD-10-CM | POA: Diagnosis not present

## 2019-03-22 DIAGNOSIS — J45909 Unspecified asthma, uncomplicated: Secondary | ICD-10-CM | POA: Insufficient documentation

## 2019-03-22 MED ORDER — KETOROLAC TROMETHAMINE 30 MG/ML IJ SOLN
15.0000 mg | Freq: Once | INTRAMUSCULAR | Status: AC
  Administered 2019-03-23: 15 mg via INTRAVENOUS
  Filled 2019-03-22: qty 1

## 2019-03-22 MED ORDER — METHOCARBAMOL 1000 MG/10ML IJ SOLN
500.0000 mg | Freq: Once | INTRAVENOUS | Status: AC
  Administered 2019-03-23: 01:00:00 500 mg via INTRAVENOUS
  Filled 2019-03-22: qty 500

## 2019-03-22 NOTE — ED Provider Notes (Signed)
[place] EMERGENCY DEPARTMENT Provider Note   CSN: [id number] Arrival date & time: 03/22/19  1522     History Chief Complaint  Patient presents with  . Back Pain    Zachary Nash is a 48 y.o. male.  Patient presents to the ED with a chief complaint of low back pain.  He has known DDD.  He states that he had injections in his spine a couple of weeks ago.  States that over the past day or so he has had extremely worsening symptoms.  He states that he bent over and when he stood back up he felt an "explosion" in his low back.  He reports new associated left leg weakness.  He states that he feels like he will fall if he walks.  He states that he has had several episodes of bladder incontinence over the past 6 weeks, but that it is not continuous. He denies any fevers, chills.  Denies any low back surgeries, but has had surgeries in his neck.  He reports having some numbness down his left arm and states that it feels like his arm is "going to fall off."  Denies any hx of cancer.  Denies any IV drug use.  The history is provided by the patient. No language interpreter was used.       Past Medical History:  Diagnosis Date  . Anxiety   . Arthritis    lower back, hands  . Asthma   . Bilateral inguinal hernia 05/29/2016   surgery to repair  . Constipation   . DDD (degenerative disc disease), lumbar   . Fatty liver   . GERD (gastroesophageal reflux disease)   . History of hiatal hernia   . History of kidney stones 2006   passed stone, no surgery  . Pneumonia   . PONV (postoperative nausea and vomiting)    pt also states his O2 drops after surgery  . Radicular pain of left lower extremity   . Sleep apnea    patient uses 2 L oxygen night - no cpap    Patient Active Problem List   Diagnosis Date Noted  . 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 Excessive daytime sleepiness 01/05/2019  . PLMD (periodic limb movement disorder) 01/05/2019  . Chronic insomnia  01/05/2019  . Upper airway cough syndrome 03/31/2018  . Cough variant asthma 03/28/2018  . DOE (dyspnea on exertion) 03/09/2018  . Nocturnal hypoxemia 03/09/2018  . Spinal cord compression ([place])   . Surgery, elective   . Cervical myelopathy ([place])   . Syncope, vasovagal   . Weakness   . Generalized weakness 05/25/2017  . Left sided numbness 05/24/2017  . Right inguinal pain 04/20/2017  . Dysphagia 03/24/2017  . Bilateral inguinal hernia 05/29/2016  . 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 Hyperglycemia 05/18/2013  . Radicular pain of left lower extremity   . Dizziness 05/16/2013    Past Surgical History:  Procedure Laterality Date  . ANTERIOR CERVICAL DECOMP/DISCECTOMY FUSION N/A 05/26/2017   Procedure: ANTERIOR CERVICAL DECOMPRESSION/DISCECTOMY FUSION TWO LEVELS Cervical five-six, Cervical six-seven;  Surgeon: Phylliss Bob, MD;  Location: [facility];  Service: Orthopedics;  Laterality: N/A;  . BIOPSY  04/11/2018   Procedure: BIOPSY;  Surgeon: Otis Brace, MD;  Location: [facility];  Service: Gastroenterology;;  . CHOLECYSTECTOMY    . ESOPHAGEAL DILATION N/A 03/24/2017   Procedure: ESOPHAGEAL DILATION;  Surgeon: Rogene Houston, MD;  Location: [facility];  Service: Endoscopy;  Laterality: N/A;  . ESOPHAGEAL MANOMETRY N/A 04/11/2018  Procedure: ESOPHAGEAL MANOMETRY (EM);  Surgeon: Otis Brace, MD;  Location: [facility];  Service: Gastroenterology;  Laterality: N/A;  . ESOPHAGOGASTRODUODENOSCOPY N/A 03/24/2017   Procedure: ESOPHAGOGASTRODUODENOSCOPY (EGD);  Surgeon: Rogene Houston, MD;  Location: [facility];  Service: Endoscopy;  Laterality: N/A;  1155  . ESOPHAGOGASTRODUODENOSCOPY (EGD) WITH PROPOFOL N/A 04/11/2018   Procedure: ESOPHAGOGASTRODUODENOSCOPY (EGD) WITH PROPOFOL;  Surgeon: Otis Brace, MD;  Location: [facility];  Service: Gastroenterology;  Laterality: N/A;  mano probe to be placed during EGD  . INGUINAL HERNIA REPAIR Bilateral 05/29/2016    Procedure: OPEN HERNIA REPAIR INGUINAL ADULT BILATERAL WITH insertion of MESH;  Surgeon: Fanny Skates, MD;  Location: [facility];  Service: General;  Laterality: Bilateral;  . Lipoma removed    . RADIOLOGY WITH ANESTHESIA N/A 06/14/2018   Procedure: MRI LUMBER SPINE WITHOUT CONTRAST;  Surgeon: Radiologist, Medication, MD;  Location: [facility];  Service: Radiology;  Laterality: N/A;  . WISDOM TOOTH EXTRACTION         Family History  Problem Relation Age of Onset  . Heart failure Father   . Heart attack Father   . Ovarian cancer Mother   . Arthritis Mother   . Anxiety disorder Mother     Social History   Tobacco Use  . Smoking status: Never Smoker  . Smokeless tobacco: Never Used  Substance Use Topics  . Alcohol use: No  . Drug use: No    Home Medications Prior to Admission medications   Medication Sig Start Date End Date Taking? Authorizing Provider  albuterol (PROVENTIL HFA;VENTOLIN HFA) 108 (90 BASE) MCG/ACT inhaler Inhale 2 puffs into the lungs every 6 (six) hours as needed for wheezing or shortness of breath.     [provider]  diazepam (VALIUM) 5 MG tablet Take 1 tablet (5 mg total) by mouth every 8 (eight) hours as needed for muscle spasms. 03/02/19   Petrucelli, Samantha R, PA-C  DULoxetine (CYMBALTA) 20 MG capsule Take 20 mg by mouth at bedtime.  02/03/18   [provider]  gabapentin (NEURONTIN) 300 MG capsule Take 1 capsule (300 mg total) by mouth 3 (three) times daily. Patient taking differently: Take 300 mg by mouth 4 (four) times daily.  04/26/18   Tanda Rockers, MD  HYDROcodone-acetaminophen (NORCO) 5-325 MG tablet Take 1 tablet by mouth every 4 (four) hours as needed for moderate pain. 05/24/18   Daleen Bo, MD  naproxen sodium (ALEVE) 220 MG tablet Take 440 mg by mouth daily as needed (for pain or headache).     [provider]  OXYGEN Inhale 2 L into the lungs at bedtime.     [provider]  pantoprazole (PROTONIX) 40 MG tablet  Take 1 tablet (40 mg total) by mouth 2 (two) times daily before a meal. 10/06/17   Setzer, Terri L, NP  predniSONE (STERAPRED UNI-PAK 21 TAB) 10 MG (21) TBPK tablet Take by mouth daily. 6, 5, 4, 3, 2, 1 take as written 03/02/19   Petrucelli, Samantha R, PA-C    Allergies    Dilaudid [hydromorphone hcl], Lidocaine, and Penicillins  Review of Systems   Review of Systems  All other systems reviewed and are negative.   Physical Exam Updated Vital Signs BP (!) 115/100 (BP Location: Right Arm)   Pulse 92   Temp 98.2 F (36.8 C) (Oral)   Resp 18   SpO2 98%   Physical Exam Vitals and nursing note reviewed.  Constitutional:      Appearance: He is well-developed.  Comments: Appears uncomfortable  HENT:     Head: Normocephalic and atraumatic.  Eyes:     Conjunctiva/sclera: Conjunctivae normal.  Cardiovascular:     Rate and Rhythm: Normal rate and regular rhythm.     Heart sounds: No murmur.  Pulmonary:     Effort: Pulmonary effort is normal. No respiratory distress.     Breath sounds: Normal breath sounds.  Abdominal:     Palpations: Abdomen is soft.     Tenderness: There is no abdominal tenderness.  Musculoskeletal:     Cervical back: Neck supple.     Comments: Bilateral patellar reflexes 2+  Left planter flexion and dorsiflexion decreased compared to right Unable to lift left leg off bed, but seems limited by pain Lumbar spine and paraspinal muscles TTP  LUE ROM and strength 5/5  Skin:    General: Skin is warm and dry.  Neurological:     Mental Status: He is alert and oriented to person, place, and time.     Comments: Sensation intact in LLE, but less sensation than in right   Psychiatric:        Mood and Affect: Mood normal.        Behavior: Behavior normal.     ED Results / Procedures / Treatments   Labs (all labs ordered are listed, but only abnormal results are displayed) Labs Reviewed  CBC WITH DIFFERENTIAL/PLATELET  BASIC METABOLIC PANEL     EKG None  Radiology MR Cervical Spine Wo Contrast  Result Date: 03/23/2019 CLINICAL DATA:  Interscapular pain. Recent spinal injection. Numbness in both legs. EXAM: MRI CERVICAL AND LUMBAR SPINE WITHOUT CONTRAST TECHNIQUE: Multiplanar and multiecho pulse sequences of the cervical spine, to include the craniocervical junction and cervicothoracic junction, and lumbar spine, were obtained without intravenous contrast. COMPARISON:  Cervical spine MRI 03/02/2019. FINDINGS: MRI CERVICAL SPINE FINDINGS Alignment: Mild reversal of cervical lordosis. Vertebrae: C5-7 ACDF. No fracture or other acute abnormality. No discitis-osteomyelitis. Cord: Normal signal and morphology. Posterior Fossa, vertebral arteries, paraspinal tissues: Negative. Disc levels: C1-C2: Normal. C2-C3: Normal disc space and facets. No spinal canal or neuroforaminal stenosis. C3-C4: Unchanged left uncovertebral osteophyte with moderate left foraminal stenosis. No spinal canal stenosis. C4-C5: Unchanged disc osteophyte complex with moderate right and severe left foraminal stenosis. Unchanged mild central spinal canal stenosis. C5-C6: ACDF without spinal canal stenosis. C6-C7: ACDF without spinal canal stenosis. C7-T1: Normal disc space and facets. No spinal canal or neuroforaminal stenosis. MRI LUMBAR SPINE FINDINGS Segmentation:  Normal Alignment:  Normal Vertebrae: No acute compression fracture, discitis-osteomyelitis, facet edema or other focal marrow lesion. No epidural collection. Conus medullaris and cauda equina: Conus extends to the L2 level. Conus and cauda equina appear normal. Paraspinal and other soft tissues: Negative. Disc levels: Levels above L4-5 are normal. L4-5: Normal disc. Moderate facet hypertrophy. No spinal canal or neural foraminal stenosis. L5-S1: Left foraminal disc protrusion with annular fissure. No spinal canal or neural foraminal stenosis. The annular fissure is in close proximity to the descending left S1 nerve  root. Moderate facet hypertrophy. IMPRESSION: 1. Unchanged C5-7 ACDF without spinal canal stenosis. 2. Unchanged moderate right and severe left foraminal stenosis at C4-C5. 3. Left foraminal L5-S1 disc protrusion with annular fissure in close proximity to the descending left S1 nerve root. Correlate for left S1 radiculopathy. 4. No lumbar spinal canal or neural foraminal stenosis. Electronically Signed   By: Ulyses Jarred M.D.   On: 03/23/2019 02:54   MR LUMBAR SPINE WO CONTRAST  Result Date: 03/23/2019 CLINICAL  DATA:  Interscapular pain. Recent spinal injection. Numbness in both legs. EXAM: MRI CERVICAL AND LUMBAR SPINE WITHOUT CONTRAST TECHNIQUE: Multiplanar and multiecho pulse sequences of the cervical spine, to include the craniocervical junction and cervicothoracic junction, and lumbar spine, were obtained without intravenous contrast. COMPARISON:  Cervical spine MRI 03/02/2019. FINDINGS: MRI CERVICAL SPINE FINDINGS Alignment: Mild reversal of cervical lordosis. Vertebrae: C5-7 ACDF. No fracture or other acute abnormality. No discitis-osteomyelitis. Cord: Normal signal and morphology. Posterior Fossa, vertebral arteries, paraspinal tissues: Negative. Disc levels: C1-C2: Normal. C2-C3: Normal disc space and facets. No spinal canal or neuroforaminal stenosis. C3-C4: Unchanged left uncovertebral osteophyte with moderate left foraminal stenosis. No spinal canal stenosis. C4-C5: Unchanged disc osteophyte complex with moderate right and severe left foraminal stenosis. Unchanged mild central spinal canal stenosis. C5-C6: ACDF without spinal canal stenosis. C6-C7: ACDF without spinal canal stenosis. C7-T1: Normal disc space and facets. No spinal canal or neuroforaminal stenosis. MRI LUMBAR SPINE FINDINGS Segmentation:  Normal Alignment:  Normal Vertebrae: No acute compression fracture, discitis-osteomyelitis, facet edema or other focal marrow lesion. No epidural collection. Conus medullaris and cauda equina: Conus  extends to the L2 level. Conus and cauda equina appear normal. Paraspinal and other soft tissues: Negative. Disc levels: Levels above L4-5 are normal. L4-5: Normal disc. Moderate facet hypertrophy. No spinal canal or neural foraminal stenosis. L5-S1: Left foraminal disc protrusion with annular fissure. No spinal canal or neural foraminal stenosis. The annular fissure is in close proximity to the descending left S1 nerve root. Moderate facet hypertrophy. IMPRESSION: 1. Unchanged C5-7 ACDF without spinal canal stenosis. 2. Unchanged moderate right and severe left foraminal stenosis at C4-C5. 3. Left foraminal L5-S1 disc protrusion with annular fissure in close proximity to the descending left S1 nerve root. Correlate for left S1 radiculopathy. 4. No lumbar spinal canal or neural foraminal stenosis. Electronically Signed   By: Ulyses Jarred M.D.   On: 03/23/2019 02:54    Procedures Procedures (including critical care time)  Medications Ordered in ED Medications  ketorolac (TORADOL) 30 MG/ML injection 15 mg (has no administration in time range)  methocarbamol (ROBAXIN) 500 mg in dextrose 5 % 50 mL IVPB (has no administration in time range)    ED Course  I have reviewed the triage vital signs and the nursing notes.  Pertinent labs & imaging results that were available during my care of the patient were reviewed by me and considered in my medical decision making (see chart for details).    MDM Rules/Calculators/A&P                       Patient here with severe left-sided low back pain.  Has history of the same.  Worse now than normal.  No fevers.  No IV drug use.  No history of cancer.  States that he was sent by Dr. Arnoldo Morale office for MRI.  MRIs show unchanged appearance of the C5-C7 ACDF, and unchanged stenosis of C4 and C5.  There is left foraminal L5 and S1 disc protrusion with annular fissure which is in close proximity to the descending left S1 nerve root.  I suspect this is likely the cause  of the patient's pain.  The cauda equina appears normal.  At this time, feel the patient is stable for discharge and can follow-up with Dr. Arnoldo Morale in his office next week.  He has an appointment scheduled.  Patient did get good improvement with Robaxin and Toradol.  I will give her a repeat dose of Toradol  in the ED, and sent home with Robaxin.  He is taking prednisone at home.  Final Clinical Impression(s) / ED Diagnoses Final diagnoses:  Acute left-sided low back pain with left-sided sciatica    Rx / DC Orders ED Discharge Orders         Ordered    methocarbamol (ROBAXIN) 500 MG tablet  2 times daily     03/23/19 0320           Namish, Krise, PA-C 03/23/19 Ripley, Dahlgren, MD 03/25/19 [phone]

## 2019-03-22 NOTE — ED Triage Notes (Signed)
Pt had a back injection last week for his chronic back pain. Pt states he bent over last night and was unable to stand up afterwards. Pt states he is in horrible pain. Pt states he has ongoing numbness in bilateral legs since before the injection and also loss of control at times of bowel and bladder. Both since before the injection.

## 2019-03-23 ENCOUNTER — Encounter (HOSPITAL_COMMUNITY): Payer: Self-pay | Admitting: *Deleted

## 2019-03-23 ENCOUNTER — Emergency Department (HOSPITAL_COMMUNITY): Payer: BC Managed Care – PPO

## 2019-03-23 LAB — CBC WITH DIFFERENTIAL/PLATELET
Abs Immature Granulocytes: 0.02 10*3/uL (ref 0.00–0.07)
Basophils Absolute: 0 10*3/uL (ref 0.0–0.1)
Basophils Relative: 1 %
Eosinophils Absolute: 0.1 10*3/uL (ref 0.0–0.5)
Eosinophils Relative: 2 %
HCT: 47.8 % (ref 39.0–52.0)
Hemoglobin: 16.1 g/dL (ref 13.0–17.0)
Immature Granulocytes: 0 %
Lymphocytes Relative: 24 %
Lymphs Abs: 1.5 10*3/uL (ref 0.7–4.0)
MCH: 29.4 pg (ref 26.0–34.0)
MCHC: 33.7 g/dL (ref 30.0–36.0)
MCV: 87.4 fL (ref 80.0–100.0)
Monocytes Absolute: 0.6 10*3/uL (ref 0.1–1.0)
Monocytes Relative: 10 %
Neutro Abs: 3.8 10*3/uL (ref 1.7–7.7)
Neutrophils Relative %: 63 %
Platelets: 223 10*3/uL (ref 150–400)
RBC: 5.47 MIL/uL (ref 4.22–5.81)
RDW: 12.9 % (ref 11.5–15.5)
WBC: 6 10*3/uL (ref 4.0–10.5)
nRBC: 0 % (ref 0.0–0.2)

## 2019-03-23 LAB — BASIC METABOLIC PANEL
Anion gap: 13 (ref 5–15)
BUN: 19 mg/dL (ref 6–20)
CO2: 20 mmol/L — ABNORMAL LOW (ref 22–32)
Calcium: 9.1 mg/dL (ref 8.9–10.3)
Chloride: 105 mmol/L (ref 98–111)
Creatinine, Ser: 1.16 mg/dL (ref 0.61–1.24)
GFR calc Af Amer: >60 mL/min (ref >60)
GFR calc non Af Amer: >60 mL/min (ref >60)
Glucose, Bld: 98 mg/dL (ref 70–99)
Potassium: 3.9 mmol/L (ref 3.5–5.1)
Sodium: 138 mmol/L (ref 135–145)

## 2019-03-23 MED ORDER — KETOROLAC TROMETHAMINE 30 MG/ML IJ SOLN
15.0000 mg | Freq: Once | INTRAMUSCULAR | Status: AC
  Administered 2019-03-23: 03:00:00 15 mg via INTRAVENOUS
  Filled 2019-03-23: qty 1

## 2019-03-23 MED ORDER — METHOCARBAMOL 500 MG PO TABS: 500.0000 mg | ORAL_TABLET | Freq: Two times a day (BID) | ORAL | 0 refills | Status: DC

## 2019-03-23 NOTE — Discharge Instructions (Addendum)
Please make an appointment to see Dr. Arnoldo Morale.  I've attached your MRI report for reference.  Your blood work looked normal today, including your kidney function.  Take the muscle relaxer as directed.  Do not operate machinery or drive while taking it.  The Lidoderm was not prescribed due to your allergy to lidocaine.  Use ice and heat as needed.

## 2019-03-28 ENCOUNTER — Other Ambulatory Visit (HOSPITAL_COMMUNITY): Payer: Self-pay | Admitting: Internal Medicine

## 2019-03-28 ENCOUNTER — Other Ambulatory Visit: Payer: Self-pay | Admitting: Internal Medicine

## 2019-03-28 DIAGNOSIS — N442 Benign cyst of testis: Secondary | ICD-10-CM

## 2019-03-28 DIAGNOSIS — M545 Low back pain, unspecified: Secondary | ICD-10-CM | POA: Insufficient documentation

## 2019-03-28 DIAGNOSIS — M542 Cervicalgia: Secondary | ICD-10-CM | POA: Insufficient documentation

## 2019-03-29 ENCOUNTER — Ambulatory Visit
Admission: EM | Admit: 2019-03-29 | Discharge: 2019-03-29 | Disposition: A | Discharge status: Home / Self Care | Payer: BC Managed Care – PPO | Attending: Emergency Medicine | Admitting: Emergency Medicine

## 2019-03-29 DIAGNOSIS — J069 Acute upper respiratory infection, unspecified: Secondary | ICD-10-CM

## 2019-03-29 DIAGNOSIS — Z20828 Contact with and (suspected) exposure to other viral communicable diseases: Secondary | ICD-10-CM

## 2019-03-29 DIAGNOSIS — Z20822 Contact with and (suspected) exposure to covid-19: Secondary | ICD-10-CM

## 2019-03-29 MED ORDER — CETIRIZINE HCL 10 MG PO TABS: 10.0000 mg | ORAL_TABLET | Freq: Every day | ORAL | 0 refills | Status: DC

## 2019-03-29 MED ORDER — BENZONATATE 100 MG PO CAPS: 100.0000 mg | ORAL_CAPSULE | Freq: Three times a day (TID) | ORAL | 0 refills | Status: DC

## 2019-03-29 MED ORDER — FLUTICASONE PROPIONATE 50 MCG/ACT NA SUSP: 2.0000 | Freq: Every day | NASAL | 0 refills | Status: DC

## 2019-03-29 NOTE — Discharge Instructions (Addendum)
COVID testing ordered.  It will take between 5-7 days for test results.  Someone will contact you regarding abnormal results.    In the meantime: You should remain isolated in your home for 10 days from symptom onset AND greater than 72 hours after symptoms resolution (absence of fever without the use of fever-reducing medication and improvement in respiratory symptoms), whichever is longer Get plenty of rest and push fluids Tessalon Perles prescribed for cough Use OTC zyrtec for nasal congestion, runny nose, and/or sore throat Use OTC flonase for nasal congestion and runny nose Use medications daily for symptom relief Use OTC medications like ibuprofen or tylenol as needed fever or pain Call or go to the ED if you have any new or worsening symptoms such as fever, worsening cough, shortness of breath, chest tightness, chest pain, turning blue, changes in mental status, etc..Marland Kitchen

## 2019-03-29 NOTE — ED Triage Notes (Signed)
Pt presents with c/o cough and sore throat that began on Sunday , pt has positive secondary exposure

## 2019-03-29 NOTE — ED Provider Notes (Signed)
[place]   [id number] 03/29/19 Arrival Time: 54   CC: COVID symptoms  SUBJECTIVE: History from: patient.  Zachary Nash is a 48 y.o. male who presents with runny nose, congestion, sore throat, mild productive cough with yellow sputum, and a few episodes of diarrhea x 3 days.  Admits to possible secondary exposure to COVID.  Has NOT tried OTC medications, but has been taking vitamin C.  Denies aggravating factors.  Reports previous symptoms in the past.   Denies fever, chills, fatigue, SOB, wheezing, chest pain, nausea, vomiting, changes in bladder habits.    ROS: As per HPI.  All other pertinent ROS negative.     Past Medical History:  Diagnosis Date  . Anxiety   . Arthritis    lower back, hands  . Asthma   . Bilateral inguinal hernia 05/29/2016   surgery to repair  . Constipation   . DDD (degenerative disc disease), lumbar   . Fatty liver   . GERD (gastroesophageal reflux disease)   . History of hiatal hernia   . History of kidney stones 2006   passed stone, no surgery  . Pneumonia   . PONV (postoperative nausea and vomiting)    pt also states his O2 drops after surgery  . Radicular pain of left lower extremity   . Sleep apnea    patient uses 2 L oxygen night - no cpap   Past Surgical History:  Procedure Laterality Date  . ANTERIOR CERVICAL DECOMP/DISCECTOMY FUSION N/A 05/26/2017   Procedure: ANTERIOR CERVICAL DECOMPRESSION/DISCECTOMY FUSION TWO LEVELS Cervical five-six, Cervical six-seven;  Surgeon: Phylliss Bob, MD;  Location: [place];  Service: Orthopedics;  Laterality: N/A;  . BIOPSY  04/11/2018   Procedure: BIOPSY;  Surgeon: Otis Brace, MD;  Location: [facility];  Service: Gastroenterology;;  . CHOLECYSTECTOMY    . ESOPHAGEAL DILATION N/A 03/24/2017   Procedure: ESOPHAGEAL DILATION;  Surgeon: Rogene Houston, MD;  [facility];  Service: Endoscopy;  Laterality: N/A;  . ESOPHAGEAL MANOMETRY N/A 04/11/2018   Procedure: ESOPHAGEAL  MANOMETRY (EM);  Surgeon: Otis Brace, MD;  Location: [facility];  Service: Gastroenterology;  Laterality: N/A;  . ESOPHAGOGASTRODUODENOSCOPY N/A 03/24/2017   Procedure: ESOPHAGOGASTRODUODENOSCOPY (EGD);  Surgeon: Rogene Houston, MD;  [facility];  Service: Endoscopy;  Laterality: N/A;  1155  . ESOPHAGOGASTRODUODENOSCOPY (EGD) WITH PROPOFOL N/A 04/11/2018   Procedure: ESOPHAGOGASTRODUODENOSCOPY (EGD) WITH PROPOFOL;  Surgeon: Otis Brace, MD;  Location: [facility];  Service: Gastroenterology;  Laterality: N/A;  mano probe to be placed during EGD  . INGUINAL HERNIA REPAIR Bilateral 05/29/2016   Procedure: OPEN HERNIA REPAIR INGUINAL ADULT BILATERAL WITH insertion of MESH;  Surgeon: Fanny Skates, MD;  Location: [place];  Service: General;  Laterality: Bilateral;  . Lipoma removed    . RADIOLOGY WITH ANESTHESIA N/A 06/14/2018   Procedure: MRI LUMBER SPINE WITHOUT CONTRAST;  Surgeon: Radiologist, Medication, MD;  Location: [place];  Service: Radiology;  Laterality: N/A;  . WISDOM TOOTH EXTRACTION     Allergies  Allergen Reactions  . Dilaudid [Hydromorphone Hcl] Anaphylaxis  . Lidocaine Other (See Comments)    SYNCOPE "passed out"  . Penicillins Anaphylaxis, Swelling and Other (See Comments)    Has patient had a PCN reaction causing immediate rash, facial/tongue/throat swelling, SOB or lightheadedness with hypotension: yes Has patient had a PCN reaction causing severe rash involving mucus membranes or skin necrosis: yes Has patient had a PCN reaction that required hospitalization: no Has patient had a PCN reaction occurring within  the last 10 years: no If all of the above answers are "NO", then may proceed with Cephalosporin use.   No current facility-administered medications on file prior to encounter.   Current Outpatient Medications on File Prior to Encounter  Medication Sig Dispense Refill  . albuterol (PROVENTIL HFA;VENTOLIN HFA) 108 (90 BASE) MCG/ACT inhaler  Inhale 2 puffs into the lungs every 6 (six) hours as needed for wheezing or shortness of breath.     . diazepam (VALIUM) 5 MG tablet Take 1 tablet (5 mg total) by mouth every 8 (eight) hours as needed for muscle spasms. 15 tablet 0  . DULoxetine (CYMBALTA) 20 MG capsule Take 20 mg by mouth at bedtime.   2  . gabapentin (NEURONTIN) 300 MG capsule Take 1 capsule (300 mg total) by mouth 3 (three) times daily. (Patient taking differently: Take 300 mg by mouth 4 (four) times daily. ) 90 capsule 2  . HYDROcodone-acetaminophen (NORCO) 5-325 MG tablet Take 1 tablet by mouth every 4 (four) hours as needed for moderate pain. 15 tablet 0  . methocarbamol (ROBAXIN) 500 MG tablet Take 1 tablet (500 mg total) by mouth 2 (two) times daily. 20 tablet 0  . naproxen sodium (ALEVE) 220 MG tablet Take 440 mg by mouth daily as needed (for pain or headache).     . OXYGEN Inhale 2 L into the lungs at bedtime.     . pantoprazole (PROTONIX) 40 MG tablet Take 1 tablet (40 mg total) by mouth 2 (two) times daily before a meal. 180 tablet 3   Social History   Socioeconomic History  . Marital status: Married    Spouse name: Not on file  . Number of children: Not on file  . Years of education: Not on file  . Highest education level: Not on file  Occupational History  . Not on file  Tobacco Use  . Smoking status: Never Smoker  . Smokeless tobacco: Never Used  Substance and Sexual Activity  . Alcohol use: No  . Drug use: No  . Sexual activity: Not on file  Other Topics Concern  . Not on file  Social History Narrative  . Not on file   Social Determinants of Health   Financial Resource Strain:   . Difficulty of Paying Living Expenses: Not on file  Food Insecurity:   . Worried About Charity fundraiser in the Last Year: Not on file  . Ran Out of Food in the Last Year: Not on file  Transportation Needs:   . Lack of Transportation (Medical): Not on file  . Lack of Transportation (Non-Medical): Not on file    Physical Activity:   . Days of Exercise per Week: Not on file  . Minutes of Exercise per Session: Not on file  Stress:   . Feeling of Stress : Not on file  Social Connections:   . Frequency of Communication with Friends and Family: Not on file  . Frequency of Social Gatherings with Friends and Family: Not on file  . Attends Religious Services: Not on file  . Active Member of Clubs or Organizations: Not on file  . Attends Archivist Meetings: Not on file  . Marital Status: Not on file  Intimate Partner Violence:   . Fear of Current or Ex-Partner: Not on file  . Emotionally Abused: Not on file  . Physically Abused: Not on file  . Sexually Abused: Not on file   Family History  Problem Relation Age of Onset  .  Heart failure Father   . Heart attack Father   . Ovarian cancer Mother   . Arthritis Mother   . Anxiety disorder Mother     OBJECTIVE:  Vitals:   03/29/19 1703  BP: 138/74  Pulse: (!) 114  Resp: 18  Temp: 98.2 F (36.8 C)  SpO2: 96%    General appearance: alert; appears mild fatigued, but nontoxic; speaking in full sentences and tolerating own secretions HEENT: NCAT; Ears: EACs clear, TMs pearly gray; Eyes: PERRL.  EOM grossly intact. Nose: nares patent without rhinorrhea, Throat: oropharynx clear, tonsils non erythematous or enlarged, uvula midline  Neck: supple without LAD Lungs: unlabored respirations, symmetrical air entry; cough: mild; no respiratory distress; CTAB Heart: regular rate and rhythm. Skin: warm and dry Psychological: alert and cooperative; normal mood and affect  ASSESSMENT & PLAN:  1. Suspected COVID-19 virus infection   2. Viral URI with cough     Meds ordered this encounter  Medications  . benzonatate (TESSALON) 100 MG capsule    Sig: Take 1 capsule (100 mg total) by mouth every 8 (eight) hours.    Dispense:  21 capsule    Refill:  0    Order Specific Question:   Supervising Provider    Answer:   Raylene Everts  [[id number]]  . cetirizine (ZYRTEC) 10 MG tablet    Sig: Take 1 tablet (10 mg total) by mouth daily.    Dispense:  30 tablet    Refill:  0    Order Specific Question:   Supervising Provider    Answer:   Raylene Everts [[id number]]  . fluticasone (FLONASE) 50 MCG/ACT nasal spray    Sig: Place 2 sprays into both nostrils daily.    Dispense:  16 g    Refill:  0    Order Specific Question:   Supervising Provider    Answer:   Raylene Everts [[id number]]   COVID testing ordered.  It will take between 5-7 days for test results.  Someone will contact you regarding abnormal results.    In the meantime: You should remain isolated in your home for 10 days from symptom onset AND greater than 72 hours after symptoms resolution (absence of fever without the use of fever-reducing medication and improvement in respiratory symptoms), whichever is longer Get plenty of rest and push fluids Tessalon Perles prescribed for cough Use OTC zyrtec for nasal congestion, runny nose, and/or sore throat Use OTC flonase for nasal congestion and runny nose Use medications daily for symptom relief Use OTC medications like ibuprofen or tylenol as needed fever or pain Call or go to the ED if you have any new or worsening symptoms such as fever, worsening cough, shortness of breath, chest tightness, chest pain, turning blue, changes in mental status, etc...   Reviewed expectations re: course of current medical issues. Questions answered. Outlined signs and symptoms indicating need for more acute intervention. Patient verbalized understanding. After Visit Summary given.         Lestine Box, PA-C 03/29/19 1745

## 2019-03-30 LAB — NOVEL CORONAVIRUS, NAA: SARS-CoV-2, NAA: NOT DETECTED

## 2019-04-01 ENCOUNTER — Ambulatory Visit (INDEPENDENT_AMBULATORY_CARE_PROVIDER_SITE_OTHER)
Admission: RE | Admit: 2019-04-01 | Discharge: 2019-04-01 | Disposition: A | Discharge status: Home / Self Care | Payer: BC Managed Care – PPO | Source: Ambulatory Visit

## 2019-04-01 DIAGNOSIS — J4540 Moderate persistent asthma, uncomplicated: Secondary | ICD-10-CM

## 2019-04-01 DIAGNOSIS — R059 Cough, unspecified: Secondary | ICD-10-CM

## 2019-04-01 DIAGNOSIS — R0981 Nasal congestion: Secondary | ICD-10-CM

## 2019-04-01 DIAGNOSIS — J3089 Other allergic rhinitis: Secondary | ICD-10-CM

## 2019-04-01 DIAGNOSIS — R05 Cough: Secondary | ICD-10-CM

## 2019-04-01 MED ORDER — PREDNISONE 20 MG PO TABS: ORAL_TABLET | ORAL | 0 refills | Status: DC

## 2019-04-01 NOTE — ED Provider Notes (Signed)
Virtual Visit via Video Note:  Zachary Nash  initiated request for Telemedicine visit with [facility] team. I connected with Zachary Nash  on 04/01/2019 at 3:19 PM  for a synchronized telemedicine visit using a video enabled HIPPA compliant telemedicine application. I verified that I am speaking with Zachary Nash  using two identifiers. Jaynee Eagles, PA-C  was physically located in a [facility] Urgent care site and Zachary Nash was located at a different location.   The limitations of evaluation and management by telemedicine as well as the availability of in-person appointments were discussed. Patient was informed that he  may incur a bill ( including co-pay) for this virtual visit encounter. Zachary Nash  expressed understanding and gave verbal consent to proceed with virtual visit.     History of Present Illness:Zachary Nash  is a 49 y.o. male presents with 5 day history of persistent productive cough, sinus congestion, subjective fever.  Has a history of asthma, uses his inhaler daily 1-3x daily. Denies smoking cigarettes.  Patient underwent an oral steroid course at the beginning of December.  He also had a steroid injection into his hip joint mid December.  His Covid testing was negative.  He was also started on doxycycline by his PCP to cover for infectious process.  ROS  No current facility-administered medications for this encounter.   Current Outpatient Medications  Medication Sig Dispense Refill  . albuterol (PROVENTIL HFA;VENTOLIN HFA) 108 (90 BASE) MCG/ACT inhaler Inhale 2 puffs into the lungs every 6 (six) hours as needed for wheezing or shortness of breath.     . benzonatate (TESSALON) 100 MG capsule Take 1 capsule (100 mg total) by mouth every 8 (eight) hours. 21 capsule 0  . cetirizine (ZYRTEC) 10 MG tablet Take 1 tablet (10 mg total) by mouth daily. 30 tablet 0  . diazepam (VALIUM) 5 MG tablet Take 1 tablet (5 mg total) by mouth every 8 (eight) hours  as needed for muscle spasms. 15 tablet 0  . DULoxetine (CYMBALTA) 20 MG capsule Take 20 mg by mouth at bedtime.   2  . fluticasone (FLONASE) 50 MCG/ACT nasal spray Place 2 sprays into both nostrils daily. 16 g 0  . gabapentin (NEURONTIN) 300 MG capsule Take 1 capsule (300 mg total) by mouth 3 (three) times daily. (Patient taking differently: Take 300 mg by mouth 4 (four) times daily. ) 90 capsule 2  . HYDROcodone-acetaminophen (NORCO) 5-325 MG tablet Take 1 tablet by mouth every 4 (four) hours as needed for moderate pain. 15 tablet 0  . methocarbamol (ROBAXIN) 500 MG tablet Take 1 tablet (500 mg total) by mouth 2 (two) times daily. 20 tablet 0  . naproxen sodium (ALEVE) 220 MG tablet Take 440 mg by mouth daily as needed (for pain or headache).     . OXYGEN Inhale 2 L into the lungs at bedtime.     . pantoprazole (PROTONIX) 40 MG tablet Take 1 tablet (40 mg total) by mouth 2 (two) times daily before a meal. 180 tablet 3     Allergies  Allergen Reactions  . Dilaudid [Hydromorphone Hcl] Anaphylaxis  . Lidocaine Other (See Comments)    SYNCOPE "passed out"  . Penicillins Anaphylaxis, Swelling and Other (See Comments)    Has patient had a PCN reaction causing immediate rash, facial/tongue/throat swelling, SOB or lightheadedness with hypotension: yes Has patient had a PCN reaction causing severe rash involving mucus membranes or skin necrosis: yes Has patient  had a PCN reaction that required hospitalization: no Has patient had a PCN reaction occurring within the last 10 years: no If all of the above answers are "NO", then may proceed with Cephalosporin use.     Past Medical History:  Diagnosis Date  . Anxiety   . Arthritis    lower back, hands  . Asthma   . Bilateral inguinal hernia 05/29/2016   surgery to repair  . Constipation   . DDD (degenerative disc disease), lumbar   . Fatty liver   . GERD (gastroesophageal reflux disease)   . History of hiatal hernia   . History of kidney  stones 2006   passed stone, no surgery  . Pneumonia   . PONV (postoperative nausea and vomiting)    pt also states his O2 drops after surgery  . Radicular pain of left lower extremity   . Sleep apnea    patient uses 2 L oxygen night - no cpap    Past Surgical History:  Procedure Laterality Date  . ANTERIOR CERVICAL DECOMP/DISCECTOMY FUSION N/A 05/26/2017   Procedure: ANTERIOR CERVICAL DECOMPRESSION/DISCECTOMY FUSION TWO LEVELS Cervical five-six, Cervical six-seven;  Surgeon: Phylliss Bob, MD;  Location: [place];  Service: Orthopedics;  Laterality: N/A;  . BIOPSY  04/11/2018   Procedure: BIOPSY;  Surgeon: Otis Brace, MD;  [facility];  Service: Gastroenterology;;  . CHOLECYSTECTOMY    . ESOPHAGEAL DILATION N/A 03/24/2017   Procedure: ESOPHAGEAL DILATION;  Surgeon: Rogene Houston, MD;  [facility];  Service: Endoscopy;  Laterality: N/A;  . ESOPHAGEAL MANOMETRY N/A 04/11/2018   Procedure: ESOPHAGEAL MANOMETRY (EM);  Surgeon: Otis Brace, MD;  [facility];  Service: Gastroenterology;  Laterality: N/A;  . ESOPHAGOGASTRODUODENOSCOPY N/A 03/24/2017   Procedure: ESOPHAGOGASTRODUODENOSCOPY (EGD);  Surgeon: Rogene Houston, MD;  [facility];  Service: Endoscopy;  Laterality: N/A;  1155  . ESOPHAGOGASTRODUODENOSCOPY (EGD) WITH PROPOFOL N/A 04/11/2018   Procedure: ESOPHAGOGASTRODUODENOSCOPY (EGD) WITH PROPOFOL;  Surgeon: Otis Brace, MD;  [facility];  Service: Gastroenterology;  Laterality: N/A;  mano probe to be placed during EGD  . INGUINAL HERNIA REPAIR Bilateral 05/29/2016   Procedure: OPEN HERNIA REPAIR INGUINAL ADULT BILATERAL WITH insertion of MESH;  Surgeon: Fanny Skates, MD;  Location: [place];  Service: General;  Laterality: Bilateral;  . Lipoma removed    . RADIOLOGY WITH ANESTHESIA N/A 06/14/2018   Procedure: MRI LUMBER SPINE WITHOUT CONTRAST;  Surgeon: Radiologist, Medication, MD;  Location: [place];  Service:  Radiology;  Laterality: N/A;  . WISDOM TOOTH EXTRACTION        Observations/Objective: Physical Exam Constitutional:      General: He is not in acute distress.    Appearance: Normal appearance. He is not ill-appearing, toxic-appearing or diaphoretic.  HENT:     Head: Atraumatic.  Eyes:     General:        Right eye: No discharge.        Left eye: No discharge.     Extraocular Movements: Extraocular movements intact.  Pulmonary:     Effort: Pulmonary effort is normal.  Neurological:     Mental Status: He is alert and oriented to person, place, and time.  Psychiatric:        Mood and Affect: Mood normal.        Behavior: Behavior normal.        Thought Content: Thought content normal.        Judgment: Judgment normal.      Assessment and  Plan:  1. Cough   2. Nasal congestion   3. Moderate persistent asthma without complication   4. Allergic rhinitis due to other allergic trigger, unspecified seasonality    Recommended patient maintain his doxycycline.  Counseled that it would be best to obtain a chest x-ray but unfortunately patient is not able to present to an urgent care today.  Offered repeat prednisone course.  Patient was agreeable to this. Counseled patient on potential for adverse effects with medications prescribed/recommended today, ER and return-to-clinic precautions discussed, patient verbalized understanding.  He plans on presenting to urgent care clinic tomorrow or Monday.    Follow Up Instructions:    I discussed the assessment and treatment plan with the patient. The patient was provided an opportunity to ask questions and all were answered. The patient agreed with the plan and demonstrated an understanding of the instructions.   The patient was advised to call back or seek an in-person evaluation if the symptoms worsen or if the condition fails to improve as anticipated.  I provided 15 minutes of non-face-to-face time during this encounter.    Jaynee Eagles, PA-C  04/01/2019 3:19 PM         Jaynee Eagles, PA-C 04/01/19 1652

## 2019-04-03 ENCOUNTER — Ambulatory Visit (HOSPITAL_COMMUNITY): Admission: RE | Admit: 2019-04-03 | Payer: BC Managed Care – PPO | Source: Ambulatory Visit

## 2019-04-03 ENCOUNTER — Encounter (HOSPITAL_COMMUNITY): Payer: Self-pay

## 2019-04-21 ENCOUNTER — Ambulatory Visit
Admission: EM | Admit: 2019-04-21 | Discharge: 2019-04-21 | Disposition: A | Discharge status: Home / Self Care | Payer: Medicaid Other

## 2019-04-21 ENCOUNTER — Other Ambulatory Visit: Payer: Self-pay

## 2019-04-21 DIAGNOSIS — Z20822 Contact with and (suspected) exposure to covid-19: Secondary | ICD-10-CM

## 2019-04-21 NOTE — ED Triage Notes (Signed)
Pt presents to UC for covid test prior to procedure in 4 days. Denies symptoms or exposure.

## 2019-04-21 NOTE — ED Provider Notes (Signed)
[place]   [id number] 04/21/19 Arrival Time: 2725   CC: COVID test  SUBJECTIVE: History from: patient.  Zachary Nash is a 49 y.o. male who presents for COVID testing.  Denies sick exposure to COVID, flu or strep.  Denies recent travel.  Presents today for COVID test, has a procedure in 4 days.  Denies aggravating or alleviating symptoms.  Denies previous COVID infection.   Denies fever, chills, fatigue, nasal congestion, rhinorrhea, sore throat, cough, SOB, wheezing, chest pain, nausea, vomiting, changes in bowel or bladder habits.    ROS: As per HPI.  All other pertinent ROS negative.     Past Medical History:  Diagnosis Date  . Anxiety   . Arthritis    lower back, hands  . Asthma   . Bilateral inguinal hernia 05/29/2016   surgery to repair  . Constipation   . DDD (degenerative disc disease), lumbar   . Fatty liver   . GERD (gastroesophageal reflux disease)   . History of hiatal hernia   . History of kidney stones 2006   passed stone, no surgery  . Pneumonia   . PONV (postoperative nausea and vomiting)    pt also states his O2 drops after surgery  . Radicular pain of left lower extremity   . Sleep apnea    patient uses 2 L oxygen night - no cpap   Past Surgical History:  Procedure Laterality Date  . ANTERIOR CERVICAL DECOMP/DISCECTOMY FUSION N/A 05/26/2017   Procedure: ANTERIOR CERVICAL DECOMPRESSION/DISCECTOMY FUSION TWO LEVELS Cervical five-six, Cervical six-seven;  Surgeon: Phylliss Bob, MD;  Location: [place];  Service: Orthopedics;  Laterality: N/A;  . BIOPSY  04/11/2018   Procedure: BIOPSY;  Surgeon: Otis Brace, MD;  Location: [facility];  Service: Gastroenterology;;  . CHOLECYSTECTOMY    . ESOPHAGEAL DILATION N/A 03/24/2017   Procedure: ESOPHAGEAL DILATION;  Surgeon: Rogene Houston, MD;  [facility];  Service: Endoscopy;  Laterality: N/A;  . ESOPHAGEAL MANOMETRY N/A 04/11/2018   Procedure: ESOPHAGEAL MANOMETRY (EM);  Surgeon:  Otis Brace, MD;  Location: [facility];  Service: Gastroenterology;  Laterality: N/A;  . ESOPHAGOGASTRODUODENOSCOPY N/A 03/24/2017   Procedure: ESOPHAGOGASTRODUODENOSCOPY (EGD);  Surgeon: Rogene Houston, MD;  [facility];  Service: Endoscopy;  Laterality: N/A;  1155  . ESOPHAGOGASTRODUODENOSCOPY (EGD) WITH PROPOFOL N/A 04/11/2018   Procedure: ESOPHAGOGASTRODUODENOSCOPY (EGD) WITH PROPOFOL;  Surgeon: Otis Brace, MD;  Location: [facility];  Service: Gastroenterology;  Laterality: N/A;  mano probe to be placed during EGD  . INGUINAL HERNIA REPAIR Bilateral 05/29/2016   Procedure: OPEN HERNIA REPAIR INGUINAL ADULT BILATERAL WITH insertion of MESH;  Surgeon: Fanny Skates, MD;  Location: [place];  Service: General;  Laterality: Bilateral;  . Lipoma removed    . RADIOLOGY WITH ANESTHESIA N/A 06/14/2018   Procedure: MRI LUMBER SPINE WITHOUT CONTRAST;  Surgeon: Radiologist, Medication, MD;  Location: [place];  Service: Radiology;  Laterality: N/A;  . WISDOM TOOTH EXTRACTION     Allergies  Allergen Reactions  . Dilaudid [Hydromorphone Hcl] Anaphylaxis  . Lidocaine Other (See Comments)    SYNCOPE "passed out"  . Penicillins Anaphylaxis, Swelling and Other (See Comments)    Has patient had a PCN reaction causing immediate rash, facial/tongue/throat swelling, SOB or lightheadedness with hypotension: yes Has patient had a PCN reaction causing severe rash involving mucus membranes or skin necrosis: yes Has patient had a PCN reaction that required hospitalization: no Has patient had a PCN reaction occurring within the last 10 years: no  If all of the above answers are "NO", then may proceed with Cephalosporin use.   No current facility-administered medications on file prior to encounter.   Current Outpatient Medications on File Prior to Encounter  Medication Sig Dispense Refill  . cyclobenzaprine (FLEXERIL) 10 MG tablet Take 10 mg by mouth 3 (three) times daily as needed for  muscle spasms.    Marland Kitchen albuterol (PROVENTIL HFA;VENTOLIN HFA) 108 (90 BASE) MCG/ACT inhaler Inhale 2 puffs into the lungs every 6 (six) hours as needed for wheezing or shortness of breath.     . benzonatate (TESSALON) 100 MG capsule Take 1 capsule (100 mg total) by mouth every 8 (eight) hours. 21 capsule 0  . cetirizine (ZYRTEC) 10 MG tablet Take 1 tablet (10 mg total) by mouth daily. 30 tablet 0  . diazepam (VALIUM) 5 MG tablet Take 1 tablet (5 mg total) by mouth every 8 (eight) hours as needed for muscle spasms. 15 tablet 0  . DULoxetine (CYMBALTA) 20 MG capsule Take 20 mg by mouth at bedtime.   2  . fluticasone (FLONASE) 50 MCG/ACT nasal spray Place 2 sprays into both nostrils daily. 16 g 0  . gabapentin (NEURONTIN) 300 MG capsule Take 1 capsule (300 mg total) by mouth 3 (three) times daily. (Patient taking differently: Take 300 mg by mouth 4 (four) times daily. ) 90 capsule 2  . HYDROcodone-acetaminophen (NORCO) 5-325 MG tablet Take 1 tablet by mouth every 4 (four) hours as needed for moderate pain. 15 tablet 0  . naproxen sodium (ALEVE) 220 MG tablet Take 440 mg by mouth daily as needed (for pain or headache).     . OXYGEN Inhale 2 L into the lungs at bedtime.     . pantoprazole (PROTONIX) 40 MG tablet Take 1 tablet (40 mg total) by mouth 2 (two) times daily before a meal. 180 tablet 3  . predniSONE (DELTASONE) 20 MG tablet Take 2 tablets daily with breakfast. 10 tablet 0   Social History   Socioeconomic History  . Marital status: Married    Spouse name: Not on file  . Number of children: Not on file  . Years of education: Not on file  . Highest education level: Not on file  Occupational History  . Not on file  Tobacco Use  . Smoking status: Never Smoker  . Smokeless tobacco: Never Used  Substance and Sexual Activity  . Alcohol use: No  . Drug use: No  . Sexual activity: Not on file  Other Topics Concern  . Not on file  Social History Narrative  . Not on file   Social  Determinants of Health   Financial Resource Strain:   . Difficulty of Paying Living Expenses: Not on file  Food Insecurity:   . Worried About Charity fundraiser in the Last Year: Not on file  . Ran Out of Food in the Last Year: Not on file  Transportation Needs:   . Lack of Transportation (Medical): Not on file  . Lack of Transportation (Non-Medical): Not on file  Physical Activity:   . Days of Exercise per Week: Not on file  . Minutes of Exercise per Session: Not on file  Stress:   . Feeling of Stress : Not on file  Social Connections:   . Frequency of Communication with Friends and Family: Not on file  . Frequency of Social Gatherings with Friends and Family: Not on file  . Attends Religious Services: Not on file  . Active Member of Clubs  or Organizations: Not on file  . Attends Archivist Meetings: Not on file  . Marital Status: Not on file  Intimate Partner Violence:   . Fear of Current or Ex-Partner: Not on file  . Emotionally Abused: Not on file  . Physically Abused: Not on file  . Sexually Abused: Not on file   Family History  Problem Relation Age of Onset  . Heart failure Father   . Heart attack Father   . Ovarian cancer Mother   . Arthritis Mother   . Anxiety disorder Mother     OBJECTIVE:  Vitals:   04/21/19 0923  BP: 109/74  Pulse: 90  Resp: 16  Temp: 98 F (36.7 C)  TempSrc: Oral  SpO2: 96%     General appearance: alert; well-appearing, nontoxic; speaking in full sentences and tolerating own secretions HEENT: NCAT; Ears: EACs clear, TMs pearly gray; Eyes: PERRL.  EOM grossly intact.Nose: nares patent without rhinorrhea, Throat: oropharynx clear, tonsils non erythematous or enlarged, uvula midline  Neck: supple without LAD Lungs: unlabored respirations, symmetrical air entry; cough: absent; no respiratory distress; CTAB Heart: regular rate and rhythm.  Neuro: Ambulates with a walker Skin: warm and dry Psychological: alert and cooperative;  normal mood and affect  ASSESSMENT & PLAN:  1. Encounter for laboratory testing for COVID-19 virus   2. Suspected COVID-19 virus infection    COVID testing ordered.  It will take between 5-7 days for test results.  Someone will contact you regarding abnormal results.    In the meantime: You should remain isolated in your home for 10 days from symptom onset AND greater than 72 hours after symptoms resolution (absence of fever without the use of fever-reducing medication and improvement in respiratory symptoms), whichever is longer OR 14 days from exposure Get plenty of rest and push fluids Use OTC zyrtec for nasal congestion, runny nose, and/or sore throat Use OTC flonase for nasal congestion and runny nose Use medications daily for symptom relief Use OTC medications like ibuprofen or tylenol as needed fever or pain Call or go to the ED if you have any new or worsening symptoms such as fever, cough, shortness of breath, chest tightness, chest pain, turning blue, changes in mental status, etc...   Reviewed expectations re: course of current medical issues. Questions answered. Outlined signs and symptoms indicating need for more acute intervention. Patient verbalized understanding. After Visit Summary given.         Lestine Box, PA-C 04/21/19 [phone]

## 2019-04-21 NOTE — Discharge Instructions (Signed)
COVID testing ordered.  It will take between 5-7 days for test results.  Someone will contact you regarding abnormal results.    In the meantime: You should remain isolated in your home for 10 days from symptom onset AND greater than 72 hours after symptoms resolution (absence of fever without the use of fever-reducing medication and improvement in respiratory symptoms), whichever is longer OR 14 days from exposure Get plenty of rest and push fluids Use OTC zyrtec for nasal congestion, runny nose, and/or sore throat Use OTC flonase for nasal congestion and runny nose Use medications daily for symptom relief Use OTC medications like ibuprofen or tylenol as needed fever or pain Call or go to the ED if you have any new or worsening symptoms such as fever, cough, shortness of breath, chest tightness, chest pain, turning blue, changes in mental status, etc..Marland Kitchen

## 2019-04-22 LAB — NOVEL CORONAVIRUS, NAA: SARS-CoV-2, NAA: NOT DETECTED

## 2019-04-24 ENCOUNTER — Other Ambulatory Visit (HOSPITAL_COMMUNITY): Payer: Self-pay

## 2019-04-24 ENCOUNTER — Other Ambulatory Visit: Payer: Self-pay

## 2019-04-24 ENCOUNTER — Other Ambulatory Visit (HOSPITAL_COMMUNITY)
Admission: RE | Admit: 2019-04-24 | Discharge: 2019-04-24 | Disposition: A | Discharge status: Home / Self Care | Payer: Self-pay | Source: Ambulatory Visit | Attending: Neurology | Admitting: Neurology

## 2019-04-26 ENCOUNTER — Ambulatory Visit (INDEPENDENT_AMBULATORY_CARE_PROVIDER_SITE_OTHER): Payer: BC Managed Care – PPO | Admitting: Neurology

## 2019-04-26 ENCOUNTER — Other Ambulatory Visit: Payer: Self-pay

## 2019-04-26 DIAGNOSIS — G4761 Periodic limb movement disorder: Secondary | ICD-10-CM

## 2019-04-26 DIAGNOSIS — G4731 Primary central sleep apnea: Secondary | ICD-10-CM

## 2019-04-26 DIAGNOSIS — G959 Disease of spinal cord, unspecified: Secondary | ICD-10-CM

## 2019-04-26 DIAGNOSIS — F5104 Psychophysiologic insomnia: Secondary | ICD-10-CM

## 2019-04-26 DIAGNOSIS — G4719 Other hypersomnia: Secondary | ICD-10-CM

## 2019-04-26 DIAGNOSIS — G9520 Unspecified cord compression: Secondary | ICD-10-CM

## 2019-05-02 ENCOUNTER — Other Ambulatory Visit: Payer: Self-pay | Admitting: Internal Medicine

## 2019-05-03 ENCOUNTER — Telehealth (INDEPENDENT_AMBULATORY_CARE_PROVIDER_SITE_OTHER): Payer: Self-pay | Admitting: Gastroenterology

## 2019-05-03 DIAGNOSIS — K219 Gastro-esophageal reflux disease without esophagitis: Secondary | ICD-10-CM

## 2019-05-03 DIAGNOSIS — R1310 Dysphagia, unspecified: Secondary | ICD-10-CM

## 2019-05-03 DIAGNOSIS — R1319 Other dysphagia: Secondary | ICD-10-CM

## 2019-05-03 MED ORDER — PANTOPRAZOLE SODIUM 40 MG PO TBEC: 40.0000 mg | DELAYED_RELEASE_TABLET | Freq: Two times a day (BID) | ORAL | 0 refills | Status: AC

## 2019-05-03 NOTE — Telephone Encounter (Signed)
Received refill request.  Patient last seen 2019.  Recommend yearly follow-up.  Will refill 190-day supply of pantoprazole 40 mg twice a day.

## 2019-05-04 NOTE — Procedures (Signed)
PATIENT'S NAME:  Zachary Nash, Oscar DOB:      07/14/1970      MR#:    [id number]     DATE OF RECORDING: 04/26/2019 REFERRING M.D.:  Margette Fast, MD Study Performed:   Titration to positive airway pressure  HISTORY:  Patient was seen on 01-05-2019 to transfer sleep care. Excessive daytime sleepiness. History of spinal cord injury with myoclonus, RLS, and had 12-2017 a sleep study in [place], not followed by MD, only 156 minutes of sleep were recorded. The patient is using 2 liters of oxygen without CPAP.  Goal was to establish if the patient has apnea and if treatment may treat hypoxemia as well. PSG performed on 02-25-2019 revealed mild obstructive sleep apnea with strong positional dependency.  The total APNEA/HYPOPNEA INDEX (AHI) was 11.8/hour.. The REM AHI was 5.6 /hour, supine AHI was 30.4/h versus a non-supine AHI of 5.3. Time spent below 89% oxygen saturation equaled only 11 minutes. PLM arousals were 3.2/h.  The patient endorsed the Epworth Sleepiness Scale at 16/24 points.   The patient's weight 218 pounds with a height of 70 (inches), resulting in a BMI of 31.2 kg/m2. The patient's neck circumference measured 19 inches.  CURRENT MEDICATIONS: Proventil, Xanax, Valium, Cymbalta, Neurontin, Norco, Aleve, Oxygen, Protonix   PROCEDURE:  This is a multichannel digital polysomnogram utilizing the SomnoStar 11.2 system.  Electrodes and sensors were applied and monitored per AASM Specifications.   EEG, EOG, Chin and Limb EMG, were sampled at 200 Hz.  ECG, Snore and Nasal Pressure, Thermal Airflow, Respiratory Effort, CPAP Flow and Pressure, Oximetry was sampled at 50 Hz. Digital video and audio were recorded.      CPAP was initiated under a ResMed P 10 nasal pillow interface with medium sized pillows- beginning at 5and titrated to a final pressure of 10 cm water- with complete resolution of apnea, hypopnea, snoring and PLMs! Also no need for additional oxygen!.  At a CPAP pressure of 10 cmH20, there  was a reduction of the AHI to 0/h over a sleep time of 178 minutes , CPAP with nasal pillows was very well tolerated. Waunita Schooner Out was at 21:18 and Lights On at 04:05. Total recording time (TRT) was 408 minutes, with a total sleep time (TST) of 289 minutes. The patient's sleep latency was 115.5 minutes. REM latency was 54.5 minutes.  The sleep efficiency was 70.8 %.    SLEEP ARCHITECTURE: WASO (Wake after sleep onset) was 3.5 minutes.  There were 9 minutes in Stage N1, 187 minutes Stage N2, 20 minutes Stage N3 and 73 minutes in Stage REM.  The percentage of Stage N1 was 3.1%, Stage N2 was 64.7%, Stage N3 was 6.9% and Stage R (REM sleep) was 25.3%. The sleep architecture was notable for prolonged sleep latency.   RESPIRATORY ANALYSIS:  There was a total of 6 respiratory events: 0 apneas and 6 hypopneas without respiratory event related arousals (RERAs).     The total APNEA/HYPOPNEA INDEX  (AHI) was 1.2 /hour .  2 events occurred in REM sleep and 4 events in NREM. The REM AHI was 1.6 /hour versus a non-REM AHI of 1.1 /hour.  The patient spent 157.5 minutes of total sleep time in the supine position and 132 minutes in non-supine. The supine AHI was 2.3, versus a non-supine AHI of 0.0.  OXYGEN SATURATION & C02:  The baseline 02 saturation was 96%, with the lowest being 89%. Time spent below 89% saturation equaled 0 minutes. The arousals were noted as: 22  were spontaneous, 0 were associated with PLMs, 4 were associated with respiratory events. The patient had now a total of 0 Periodic Limb Movements ! Audio and video analysis did not show any abnormal or unusual movements, behaviors, phonations or vocalizations.   EKG was in keeping with normal sinus rhythm (NSR).  DIAGNOSIS CPAP was initiated under a ResMed P 10 nasal pillow interface with medium sized pillows- beginning at 5and titrated to a final pressure of 10 cm water- with complete resolution of obstructive sleep apnea, hypopnea, snoring and PLMs !  Also no need for additional oxygen!.  At a CPAP pressure of 10 cmH20, there was a reduction of the AHI to 0/h over a sleep time of 178 minutes , CPAP with nasal pillows was very well tolerated.   PLANS/RECOMMENDATIONS: ResMed P 10 nasal pillow interface with medium sized pillows, and autotitration capable CPAP with a pressure of 5-12 cm water, 1 cm EPR and heated humidity.  No need for PLM medication, no additional oxygen needed.   DISCUSSION:A follow up appointment will be scheduled with NP Butler Denmark in the Sleep Clinic at [facility].   Please call [phone] with any questions.      I certify that I have reviewed the entire raw data recording prior to the issuance of this report in accordance with the Standards of Accreditation of the American Academy of Sleep Medicine (AASM)    Larey Seat, M.D.  05-04-2019 Diplomat, American Board of Psychiatry and Neurology  Diplomat, [place] of Sleep Medicine Medical Director, [name] [facility] at [facility]

## 2019-05-04 NOTE — Progress Notes (Signed)
DIAGNOSIS  CPAP was initiated under a ResMed P 10 nasal pillow interface  with medium sized pillows- beginning at 5and titrated to a final  pressure of 10 cm water- with complete resolution of obstructive  sleep apnea, hypopnea, snoring and PLMs ! Also no need for  additional oxygen- and well tolerated.   PLANS/RECOMMENDATIONS: ResMed P 10 nasal pillow interface with  medium sized pillows, and autotitration capable CPAP with a  pressure of 5-12 cm water, 1 cm EPR and heated humidity.  No need for PLM medication, no additional oxygen needed.   DISCUSSION:A follow up appointment will be scheduled with NP  Butler Denmark in the Sleep Clinic at [facility].  Please call [phone] with any questions.

## 2019-05-04 NOTE — Addendum Note (Signed)
Addended by: Larey Seat on: 05/04/2019 12:27 PM   Modules accepted: Orders

## 2019-05-08 ENCOUNTER — Telehealth: Payer: Self-pay | Admitting: Neurology

## 2019-05-08 NOTE — Telephone Encounter (Signed)
I called Zachary Nash. I advised Zachary Nash that Dr. Brett Fairy reviewed their sleep study results and found that Zachary Nash was best treated with CPAP at pressure of 10 cm. Dr. Brett Fairy recommends that Zachary Nash starts auto CPAP. I reviewed PAP compliance expectations with the Zachary Nash. Zachary Nash is agreeable to starting a CPAP. I advised Zachary Nash that an order will be sent to a DME, [facility], and [facility] will call the Zachary Nash within about one week after they file with the Zachary Nash's insurance. [facility] will show the Zachary Nash how to use the machine, fit for masks, and troubleshoot the CPAP if needed. A follow up appt was made for insurance purposes with Ward Givens, NP on April 8,2021 at 11:30 am. Zachary Nash verbalized understanding to arrive 15 minutes early and bring their CPAP. A letter with all of this information in it will be mailed to the Zachary Nash as a reminder. I verified with the Zachary Nash that the address we have on file is correct. Zachary Nash verbalized understanding of results. Zachary Nash had no questions at this time but was encouraged to call back if questions arise. I have sent the order to [facility] and have received confirmation that they have received the order.  Zachary Nash described having new insurance since he completed the study.  [employer] insurance MEMBER ID: [id number] Group#- [facility]  RX BIN: [id number] RX [id number] RX GROUP: [id number]  Provider Phone # [phone]

## 2019-05-08 NOTE — Telephone Encounter (Signed)
-----   Message from Larey Seat, MD sent at 05/04/2019 12:27 PM EST ----- DIAGNOSIS  CPAP was initiated under a ResMed P 10 nasal pillow interface  with medium sized pillows- beginning at 5and titrated to a final  pressure of 10 cm water- with complete resolution of obstructive  sleep apnea, hypopnea, snoring and PLMs ! Also no need for  additional oxygen- and well tolerated.   PLANS/RECOMMENDATIONS: ResMed P 10 nasal pillow interface with  medium sized pillows, and autotitration capable CPAP with a  pressure of 5-12 cm water, 1 cm EPR and heated humidity.  No need for PLM medication, no additional oxygen needed.   DISCUSSION:A follow up appointment will be scheduled with NP  Butler Denmark in the Sleep Clinic at [facility].  Please call [phone] with any questions.

## 2019-05-11 ENCOUNTER — Other Ambulatory Visit: Payer: Self-pay

## 2019-05-11 ENCOUNTER — Ambulatory Visit (HOSPITAL_COMMUNITY)
Admission: RE | Admit: 2019-05-11 | Discharge: 2019-05-11 | Disposition: A | Discharge status: Home / Self Care | Payer: 59 | Source: Ambulatory Visit | Attending: Internal Medicine | Admitting: Internal Medicine

## 2019-05-11 DIAGNOSIS — N442 Benign cyst of testis: Secondary | ICD-10-CM | POA: Insufficient documentation

## 2019-06-05 ENCOUNTER — Encounter: Payer: Self-pay | Admitting: Adult Health

## 2019-06-26 ENCOUNTER — Encounter: Payer: Self-pay | Admitting: Emergency Medicine

## 2019-06-26 ENCOUNTER — Ambulatory Visit
Admission: EM | Admit: 2019-06-26 | Discharge: 2019-06-26 | Disposition: A | Discharge status: Home / Self Care | Payer: 59 | Attending: Emergency Medicine | Admitting: Emergency Medicine

## 2019-06-26 ENCOUNTER — Other Ambulatory Visit: Payer: Self-pay

## 2019-06-26 DIAGNOSIS — R197 Diarrhea, unspecified: Secondary | ICD-10-CM | POA: Diagnosis not present

## 2019-06-26 DIAGNOSIS — J029 Acute pharyngitis, unspecified: Secondary | ICD-10-CM

## 2019-06-26 DIAGNOSIS — Z203 Contact with and (suspected) exposure to rabies: Secondary | ICD-10-CM

## 2019-06-26 DIAGNOSIS — R05 Cough: Secondary | ICD-10-CM

## 2019-06-26 DIAGNOSIS — Z20822 Contact with and (suspected) exposure to covid-19: Secondary | ICD-10-CM | POA: Diagnosis not present

## 2019-06-26 DIAGNOSIS — J069 Acute upper respiratory infection, unspecified: Secondary | ICD-10-CM

## 2019-06-26 NOTE — ED Triage Notes (Addendum)
Sore throat, headache and fever that started yesterday.  General body aches

## 2019-06-26 NOTE — Discharge Instructions (Signed)
COVID testing ordered.  It will take between 2-5 days for test results.  Someone will contact you regarding abnormal results.    In the meantime: You should remain isolated in your home for 10 days from symptom onset AND greater than 72 hours after symptoms resolution (absence of fever without the use of fever-reducing medication and improvement in respiratory symptoms), whichever is longer Get plenty of rest and push fluids Use OTC zyrtec for nasal congestion, runny nose, and/or sore throat Use OTC flonase for nasal congestion and runny nose Use medications daily for symptom relief Use OTC medications like ibuprofen or tylenol as needed fever or pain Call or go to the ED if you have any new or worsening symptoms such as fever, cough, shortness of breath, chest tightness, chest pain, turning blue, changes in mental status, etc..Marland Kitchen

## 2019-06-26 NOTE — ED Provider Notes (Signed)
[place]   [id number] 06/26/19 Arrival Time: 8502   CC: COVID symptoms  SUBJECTIVE: History from: patient.  Zachary Nash is a 49 y.o. male who presents with congestion, sore throat, mild cough, a few episodes of diarrhea, headache, fever with tmax of 99, and general body aches x 1 day.  Denies sick exposure to COVID, flu or strep.  Denies recent travel.  Has tried OTC allergy medication with minimal relief.  Denies previous COVID infection.  Denies fever, chills, fatigue, sinus pain, rhinorrhea, sore throat, SOB, wheezing, chest pain, nausea, changes in bowel or bladder habits.    ROS: As per HPI.  All other pertinent ROS negative.     Past Medical History:  Diagnosis Date  . Anxiety   . Arthritis    lower back, hands  . Asthma   . Bilateral inguinal hernia 05/29/2016   surgery to repair  . Constipation   . DDD (degenerative disc disease), lumbar   . Fatty liver   . GERD (gastroesophageal reflux disease)   . History of hiatal hernia   . History of kidney stones 2006   passed stone, no surgery  . Pneumonia   . PONV (postoperative nausea and vomiting)    pt also states his O2 drops after surgery  . Radicular pain of left lower extremity   . Sleep apnea    patient uses 2 L oxygen night - no cpap   Past Surgical History:  Procedure Laterality Date  . ANTERIOR CERVICAL DECOMP/DISCECTOMY FUSION N/A 05/26/2017   Procedure: ANTERIOR CERVICAL DECOMPRESSION/DISCECTOMY FUSION TWO LEVELS Cervical five-six, Cervical six-seven;  Surgeon: Phylliss Bob, MD;  Location: [facility];  Service: Orthopedics;  Laterality: N/A;  . BIOPSY  04/11/2018   Procedure: BIOPSY;  Surgeon: Otis Brace, MD;  Location: [facility];  Service: Gastroenterology;;  . CHOLECYSTECTOMY    . ESOPHAGEAL DILATION N/A 03/24/2017   Procedure: ESOPHAGEAL DILATION;  Surgeon: Rogene Houston, MD;  [facility];  Service: Endoscopy;  Laterality: N/A;  . ESOPHAGEAL MANOMETRY N/A 04/11/2018   Procedure: ESOPHAGEAL MANOMETRY (EM);  Surgeon: Otis Brace, MD;  Location: [facility];  Service: Gastroenterology;  Laterality: N/A;  . ESOPHAGOGASTRODUODENOSCOPY N/A 03/24/2017   Procedure: ESOPHAGOGASTRODUODENOSCOPY (EGD);  Surgeon: Rogene Houston, MD;  [facility];  Service: Endoscopy;  Laterality: N/A;  1155  . ESOPHAGOGASTRODUODENOSCOPY (EGD) WITH PROPOFOL N/A 04/11/2018   Procedure: ESOPHAGOGASTRODUODENOSCOPY (EGD) WITH PROPOFOL;  Surgeon: Otis Brace, MD;  Location: [facility];  Service: Gastroenterology;  Laterality: N/A;  mano probe to be placed during EGD  . INGUINAL HERNIA REPAIR Bilateral 05/29/2016   Procedure: OPEN HERNIA REPAIR INGUINAL ADULT BILATERAL WITH insertion of MESH;  Surgeon: Fanny Skates, MD;  Location: [facility];  Service: General;  Laterality: Bilateral;  . Lipoma removed    . RADIOLOGY WITH ANESTHESIA N/A 06/14/2018   Procedure: MRI LUMBER SPINE WITHOUT CONTRAST;  Surgeon: Radiologist, Medication, MD;  Location: [facility];  Service: Radiology;  Laterality: N/A;  . WISDOM TOOTH EXTRACTION     Allergies  Allergen Reactions  . Dilaudid [Hydromorphone Hcl] Anaphylaxis  . Lidocaine Other (See Comments)    SYNCOPE "passed out"  . Penicillins Anaphylaxis, Swelling and Other (See Comments)    Has patient had a PCN reaction causing immediate rash, facial/tongue/throat swelling, SOB or lightheadedness with hypotension: yes Has patient had a PCN reaction causing severe rash involving mucus membranes or skin necrosis: yes Has patient had a PCN reaction that required hospitalization: no Has patient had a PCN  reaction occurring within the last 10 years: no If all of the above answers are "NO", then may proceed with Cephalosporin use.   No current facility-administered medications on file prior to encounter.   Current Outpatient Medications on File Prior to Encounter  Medication Sig Dispense Refill  . albuterol (PROVENTIL HFA;VENTOLIN HFA) 108 (90 BASE)  MCG/ACT inhaler Inhale 2 puffs into the lungs every 6 (six) hours as needed for wheezing or shortness of breath.     . cetirizine (ZYRTEC) 10 MG tablet Take 1 tablet (10 mg total) by mouth daily. 30 tablet 0  . DULoxetine (CYMBALTA) 20 MG capsule Take 20 mg by mouth at bedtime.   2  . fluticasone (FLONASE) 50 MCG/ACT nasal spray Place 2 sprays into both nostrils daily. 16 g 0  . gabapentin (NEURONTIN) 300 MG capsule TAKE ONE CAPSULE (300MG TOTAL) BY MOUTH THREE TIMES DAILY 90 capsule 2  . pantoprazole (PROTONIX) 40 MG tablet Take 1 tablet (40 mg total) by mouth 2 (two) times daily before a meal. 180 tablet 0  . diazepam (VALIUM) 5 MG tablet Take 1 tablet (5 mg total) by mouth every 8 (eight) hours as needed for muscle spasms. 15 tablet 0  . naproxen sodium (ALEVE) 220 MG tablet Take 440 mg by mouth daily as needed (for pain or headache).     . OXYGEN Inhale 2 L into the lungs at bedtime.      Social History   Socioeconomic History  . Marital status: Married    Spouse name: Not on file  . Number of children: Not on file  . Years of education: Not on file  . Highest education level: Not on file  Occupational History  . Not on file  Tobacco Use  . Smoking status: Never Smoker  . Smokeless tobacco: Never Used  Substance and Sexual Activity  . Alcohol use: No  . Drug use: No  . Sexual activity: Not on file  Other Topics Concern  . Not on file  Social History Narrative  . Not on file   Social Determinants of Health   Financial Resource Strain:   . Difficulty of Paying Living Expenses:   Food Insecurity:   . Worried About Charity fundraiser in the Last Year:   . Arboriculturist in the Last Year:   Transportation Needs:   . Film/video editor (Medical):   Marland Kitchen Lack of Transportation (Non-Medical):   Physical Activity:   . Days of Exercise per Week:   . Minutes of Exercise per Session:   Stress:   . Feeling of Stress :   Social Connections:   . Frequency of Communication with  Friends and Family:   . Frequency of Social Gatherings with Friends and Family:   . Attends Religious Services:   . Active Member of Clubs or Organizations:   . Attends Archivist Meetings:   Marland Kitchen Marital Status:   Intimate Partner Violence:   . Fear of Current or Ex-Partner:   . Emotionally Abused:   Marland Kitchen Physically Abused:   . Sexually Abused:    Family History  Problem Relation Age of Onset  . Heart failure Father   . Heart attack Father   . Ovarian cancer Mother   . Arthritis Mother   . Anxiety disorder Mother     OBJECTIVE:  Vitals:   06/26/19 1504  BP: (!) 142/89  Pulse: (!) 102  Resp: 18  Temp: 98.3 F (36.8 C)  TempSrc: Oral  SpO2: 96%    General appearance: alert; well-appearing, nontoxic; speaking in full sentences and tolerating own secretions HEENT: NCAT; Ears: EACs clear, TMs pearly gray; Eyes: PERRL.  EOM grossly intact. Nose: nares patent without rhinorrhea, Throat: oropharynx clear, tonsils non erythematous or enlarged, uvula midline  Neck: supple Lungs: unlabored respirations, symmetrical air entry; cough: mild; no respiratory distress; CTAB Heart: regular rate and rhythm.   Skin: warm and dry Neuro: Ambulates with cane Psychological: alert and cooperative; normal mood and affect  ASSESSMENT & PLAN:  1. Suspected COVID-19 virus infection   2. Viral URI with cough    COVID testing ordered.  It will take between 2-5 days for test results.  Someone will contact you regarding abnormal results.    In the meantime: You should remain isolated in your home for 10 days from symptom onset AND greater than 72 hours after symptoms resolution (absence of fever without the use of fever-reducing medication and improvement in respiratory symptoms), whichever is longer Get plenty of rest and push fluids Use OTC zyrtec for nasal congestion, runny nose, and/or sore throat Use OTC flonase for nasal congestion and runny nose Use medications daily for symptom  relief Use OTC medications like ibuprofen or tylenol as needed fever or pain Call or go to the ED if you have any new or worsening symptoms such as fever, cough, shortness of breath, chest tightness, chest pain, turning blue, changes in mental status, etc...   Reviewed expectations re: course of current medical issues. Questions answered. Outlined signs and symptoms indicating need for more acute intervention. Patient verbalized understanding. After Visit Summary given.         Lestine Box, PA-C 06/26/19 1530

## 2019-06-28 LAB — SARS-COV-2, NAA 2 DAY TAT

## 2019-06-28 LAB — NOVEL CORONAVIRUS, NAA: SARS-CoV-2, NAA: NOT DETECTED

## 2019-06-30 ENCOUNTER — Ambulatory Visit
Admission: EM | Admit: 2019-06-30 | Discharge: 2019-06-30 | Disposition: A | Discharge status: Home / Self Care | Payer: 59 | Attending: Emergency Medicine | Admitting: Emergency Medicine

## 2019-06-30 ENCOUNTER — Other Ambulatory Visit: Payer: Self-pay

## 2019-06-30 ENCOUNTER — Telehealth: Payer: Self-pay

## 2019-06-30 DIAGNOSIS — R05 Cough: Secondary | ICD-10-CM

## 2019-06-30 DIAGNOSIS — J0100 Acute maxillary sinusitis, unspecified: Secondary | ICD-10-CM | POA: Diagnosis not present

## 2019-06-30 DIAGNOSIS — R059 Cough, unspecified: Secondary | ICD-10-CM

## 2019-06-30 MED ORDER — GUAIFENESIN 100 MG/5ML PO SYRP: 100.0000 mg | ORAL_SOLUTION | ORAL | 0 refills | Status: DC | PRN

## 2019-06-30 MED ORDER — DOXYCYCLINE HYCLATE 100 MG PO CAPS: 100.0000 mg | ORAL_CAPSULE | Freq: Two times a day (BID) | ORAL | 0 refills | Status: DC

## 2019-06-30 NOTE — ED Triage Notes (Signed)
Pt presents with cough since Sunday evening. Reports covid test was negative. States that his cough is productive and worsening so he came back to be seen for antibiotic's.

## 2019-06-30 NOTE — ED Provider Notes (Signed)
[place]   [id number] 06/30/19 Arrival Time: 1813   CC: URI symptoms  SUBJECTIVE: History from: patient.  MIKHAI Nash is a 49 y.o. male who presents with worsening nasal congestion, sinus pain/ pressure, dry cough, sore throat, and hoarse voice x 4-5 days.  Denies sick exposure to COVID, flu or strep.  Denies recent travel.  Has tried OTC medications with minimal relief.  Symptoms are made worse at night.  Reports previous symptoms in the past.   Denies fever, SOB, wheezing, chest pain, nausea, vomiting, changes in bowel or bladder habits.    ROS: As per HPI.  All other pertinent ROS negative.     Past Medical History:  Diagnosis Date  . Anxiety   . Arthritis    lower back, hands  . Asthma   . Bilateral inguinal hernia 05/29/2016   surgery to repair  . Constipation   . DDD (degenerative disc disease), lumbar   . Fatty liver   . GERD (gastroesophageal reflux disease)   . History of hiatal hernia   . History of kidney stones 2006   passed stone, no surgery  . Pneumonia   . PONV (postoperative nausea and vomiting)    pt also states his O2 drops after surgery  . Radicular pain of left lower extremity   . Sleep apnea    patient uses 2 L oxygen night - no cpap   Past Surgical History:  Procedure Laterality Date  . ANTERIOR CERVICAL DECOMP/DISCECTOMY FUSION N/A 05/26/2017   Procedure: ANTERIOR CERVICAL DECOMPRESSION/DISCECTOMY FUSION TWO LEVELS Cervical five-six, Cervical six-seven;  Surgeon: Phylliss Bob, MD;  Location: [facility];  Service: Orthopedics;  Laterality: N/A;  . BIOPSY  04/11/2018   Procedure: BIOPSY;  Surgeon: Otis Brace, MD;  [facility];  Service: Gastroenterology;;  . CHOLECYSTECTOMY    . ESOPHAGEAL DILATION N/A 03/24/2017   Procedure: ESOPHAGEAL DILATION;  Surgeon: Rogene Houston, MD;  [facility];  Service: Endoscopy;  Laterality: N/A;  . ESOPHAGEAL MANOMETRY N/A 04/11/2018   Procedure: ESOPHAGEAL MANOMETRY (EM);   Surgeon: Otis Brace, MD;  [facility];  Service: Gastroenterology;  Laterality: N/A;  . ESOPHAGOGASTRODUODENOSCOPY N/A 03/24/2017   Procedure: ESOPHAGOGASTRODUODENOSCOPY (EGD);  Surgeon: Rogene Houston, MD;  [facility];  Service: Endoscopy;  Laterality: N/A;  1155  . ESOPHAGOGASTRODUODENOSCOPY (EGD) WITH PROPOFOL N/A 04/11/2018   Procedure: ESOPHAGOGASTRODUODENOSCOPY (EGD) WITH PROPOFOL;  Surgeon: Otis Brace, MD;  [facility];  Service: Gastroenterology;  Laterality: N/A;  mano probe to be placed during EGD  . INGUINAL HERNIA REPAIR Bilateral 05/29/2016   Procedure: OPEN HERNIA REPAIR INGUINAL ADULT BILATERAL WITH insertion of MESH;  Surgeon: Fanny Skates, MD;  Location: [facility];  Service: General;  Laterality: Bilateral;  . Lipoma removed    . RADIOLOGY WITH ANESTHESIA N/A 06/14/2018   Procedure: MRI LUMBER SPINE WITHOUT CONTRAST;  Surgeon: Radiologist, Medication, MD;  Location: [facility];  Service: Radiology;  Laterality: N/A;  . WISDOM TOOTH EXTRACTION     Allergies  Allergen Reactions  . Dilaudid [Hydromorphone Hcl] Anaphylaxis  . Lidocaine Other (See Comments)    SYNCOPE "passed out"  . Penicillins Anaphylaxis, Swelling and Other (See Comments)    Has patient had a PCN reaction causing immediate rash, facial/tongue/throat swelling, SOB or lightheadedness with hypotension: yes Has patient had a PCN reaction causing severe rash involving mucus membranes or skin necrosis: yes Has patient had a PCN reaction that required hospitalization: no Has patient had a PCN reaction occurring within  the last 10 years: no If all of the above answers are "NO", then may proceed with Cephalosporin use.   No current facility-administered medications on file prior to encounter.   Current Outpatient Medications on File Prior to Encounter  Medication Sig Dispense Refill  . albuterol (PROVENTIL HFA;VENTOLIN HFA) 108 (90 BASE) MCG/ACT inhaler Inhale 2 puffs into  the lungs every 6 (six) hours as needed for wheezing or shortness of breath.     . cetirizine (ZYRTEC) 10 MG tablet Take 1 tablet (10 mg total) by mouth daily. 30 tablet 0  . diazepam (VALIUM) 5 MG tablet Take 1 tablet (5 mg total) by mouth every 8 (eight) hours as needed for muscle spasms. 15 tablet 0  . DULoxetine (CYMBALTA) 20 MG capsule Take 20 mg by mouth at bedtime.   2  . fluticasone (FLONASE) 50 MCG/ACT nasal spray Place 2 sprays into both nostrils daily. 16 g 0  . gabapentin (NEURONTIN) 300 MG capsule TAKE ONE CAPSULE (300MG TOTAL) BY MOUTH THREE TIMES DAILY 90 capsule 2  . naproxen sodium (ALEVE) 220 MG tablet Take 440 mg by mouth daily as needed (for pain or headache).     . OXYGEN Inhale 2 L into the lungs at bedtime.     . pantoprazole (PROTONIX) 40 MG tablet Take 1 tablet (40 mg total) by mouth 2 (two) times daily before a meal. 180 tablet 0   Social History   Socioeconomic History  . Marital status: Married    Spouse name: Not on file  . Number of children: Not on file  . Years of education: Not on file  . Highest education level: Not on file  Occupational History  . Not on file  Tobacco Use  . Smoking status: Never Smoker  . Smokeless tobacco: Never Used  Substance and Sexual Activity  . Alcohol use: No  . Drug use: No  . Sexual activity: Not on file  Other Topics Concern  . Not on file  Social History Narrative  . Not on file   Social Determinants of Health   Financial Resource Strain:   . Difficulty of Paying Living Expenses:   Food Insecurity:   . Worried About Charity fundraiser in the Last Year:   . Arboriculturist in the Last Year:   Transportation Needs:   . Film/video editor (Medical):   Marland Kitchen Lack of Transportation (Non-Medical):   Physical Activity:   . Days of Exercise per Week:   . Minutes of Exercise per Session:   Stress:   . Feeling of Stress :   Social Connections:   . Frequency of Communication with Friends and Family:   . Frequency  of Social Gatherings with Friends and Family:   . Attends Religious Services:   . Active Member of Clubs or Organizations:   . Attends Archivist Meetings:   Marland Kitchen Marital Status:   Intimate Partner Violence:   . Fear of Current or Ex-Partner:   . Emotionally Abused:   Marland Kitchen Physically Abused:   . Sexually Abused:    Family History  Problem Relation Age of Onset  . Heart failure Father   . Heart attack Father   . Ovarian cancer Mother   . Arthritis Mother   . Anxiety disorder Mother     OBJECTIVE:  Vitals:   06/30/19 1819  BP: 138/77  Pulse: (!) 104  Resp: 17  Temp: 98.1 F (36.7 C)  TempSrc: Oral  SpO2: 98%  General appearance: alert; appears mildly fatigued, but nontoxic; speaking in full sentences and tolerating own secretions HEENT: NCAT; Ears: EACs clear, TMs pearly gray; Eyes: PERRL.  EOM grossly intact. Sinuses: TTP over maxillary sinuses; Nose: nares patent without rhinorrhea, turbinates swollen and erythematous, Throat: oropharynx clear, tonsils non erythematous or enlarged, uvula midline  Neck: supple without LAD Lungs: unlabored respirations, symmetrical air entry; cough: mild; no respiratory distress; CTAB Heart: regular rate and rhythm.   Skin: warm and dry Psychological: alert and cooperative; normal mood and affect  ASSESSMENT & PLAN:  1. Acute non-recurrent maxillary sinusitis   2. Cough     Meds ordered this encounter  Medications  . doxycycline (VIBRAMYCIN) 100 MG capsule    Sig: Take 1 capsule (100 mg total) by mouth 2 (two) times daily.    Dispense:  20 capsule    Refill:  0    Order Specific Question:   Supervising Provider    Answer:   Raylene Everts [[id number]]  . guaifenesin (ROBITUSSIN) 100 MG/5ML syrup    Sig: Take 5-10 mLs (100-200 mg total) by mouth every 4 (four) hours as needed for cough.    Dispense:  60 mL    Refill:  0    Order Specific Question:   Supervising Provider    Answer:   Raylene Everts [[id number]]   Rest  and push fluids Doxycycline prescribed.  Take as directed and to completion Robitussin prescribed.  Use as needed for cough at bedtime Continue with tessalon perles during the day Follow up with PCP as needed Return or go to the ED if you have any new or worsening symptoms such as fever, chills, worsening sinus pain/pressure, cough, sore throat, chest pain, shortness of breath, abdominal pain, changes in bowel or bladder habits, etc...   Reviewed expectations re: course of current medical issues. Questions answered. Outlined signs and symptoms indicating need for more acute intervention. Patient verbalized understanding. After Visit Summary given.         Lestine Box, PA-C 06/30/19 1835

## 2019-06-30 NOTE — Discharge Instructions (Addendum)
Rest and push fluids Doxycycline prescribed.  Take as directed and to completion Robitussin prescribed.  Use as needed for cough at bedtime Continue with tessalon perles during the day Follow up with PCP as needed Return or go to the ED if you have any new or worsening symptoms such as fever, chills, worsening sinus pain/pressure, cough, sore throat, chest pain, shortness of breath, abdominal pain, changes in bowel or bladder habits, etc..Marland Kitchen

## 2019-07-06 ENCOUNTER — Other Ambulatory Visit: Payer: Self-pay

## 2019-07-06 ENCOUNTER — Ambulatory Visit (INDEPENDENT_AMBULATORY_CARE_PROVIDER_SITE_OTHER): Payer: 59 | Admitting: Adult Health

## 2019-07-06 ENCOUNTER — Encounter: Payer: Self-pay | Admitting: Adult Health

## 2019-07-06 VITALS — BP 118/80 | HR 84 | Temp 97.9°F | Ht 70.5 in | Wt 215.0 lb

## 2019-07-06 DIAGNOSIS — Z9989 Dependence on other enabling machines and devices: Secondary | ICD-10-CM

## 2019-07-06 DIAGNOSIS — G4733 Obstructive sleep apnea (adult) (pediatric): Secondary | ICD-10-CM | POA: Diagnosis not present

## 2019-07-06 NOTE — Progress Notes (Signed)
PATIENT: Zachary Nash DOB: [birth year]  REASON FOR VISIT: follow up HISTORY FROM: patient  HISTORY OF PRESENT ILLNESS: Today 07/06/19:  [name] is a 49 year old male with a history of obstructive sleep apnea on CPAP.  His download indicates that he uses machine 29 out of 30 days for compliance of 97%.  He uses machine greater than 4 hours each night.  On average he uses his machine 6 hours and 43 minutes.  His residual AHI is 2 on 5 to 12 cm of water with EPR of 1.  Leak in the 95th percentile is 23.6 L/min.  He does feel the mask leaking at times.  HISTORY Zachary Nash a 49 y.o. year old Caucasian ( [place])  male patientseen here upon  a referralon 01/05/2019 from Dr. Jannifer Nash- to transfer sleep care . Chiefconcernaccording to patient : " My wife has noted I am jerking in my sleep, I don;t feel that I am sleeping but she noted me to sleep- attacks'. [name] underwent a sleep study just a year ago January 11, 2018 where he was referred by Dr. Nevada Nash.  The performing physician was Dr. Merlene Nash, the sleep study was initiated at about 10 PM and ended at 4:40 AM sleep efficiency was extremely poor 38.9% of the recorded time which would equal a total sleep time of 156 minutes, not eating 3 hours of valid data.  Thereby the study has not been valid.  No mean heart rate established. No REM sleep apnea.   I have the pleasure of seeing Zachary Nash today,a right -handed Caucasian male with a possible sleep disorder. He has a medical history of Anxiety, Arthritis, Asthma, Bilateral inguinal hernia (05/29/2016), oxygen dependent-  Constipation, DDD (degenerative disc disease), lumbar, Fatty liver, GERD (gastroesophageal reflux disease), History of hiatal hernia, History of kidney stones (2006), Pneumonia, PONV (postoperative nausea and vomiting), Radicular pain of left lower extremity, and Sleep apnea.  Familymedical /sleep history:father, paternal uncles, cousins with  OSA.  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 He  lives in a household with 2 persons. Family status is married , with adult children. Pets are not  present. Tobacco use; none . ETOH use; none ,  Caffeine intake in form of Coffee( none ) Soda( many ) Tea (sweet , many ) and  energy drinks. 6 or more a day.  Regular exercise = none .  Very engaged in church, 3 times a week.    Sleep habits are as follows:The patient's dinner time is between 7.30 PM. The patient goes to bed at 11 PM and struggles to go to sleep- jerking movements, RLS, spinal cord compression. Fusion  C 6-7 , L 4-5. continues to sleep for 3-4 hours, wakes for 2-3  bathroom breaks, has incontinence- without sensing urine flow-  the first time at 1 AM.   The preferred sleep position is supine- due to back pain- , with the support of 2 pillows. " my pillow"  Dreams are reportedly frequent/vivid - sometimes night mares. Nocturnal myoclonus.   7.30 AM is the usual rise time. The patient wakes up spontaneously at 4.30- 6 AM .  He reports not feeling refreshed or restored in AM, with symptoms such as dry mouth ( very) , morning headaches 9 yes) , and residual fatigue.  Naps are taken frequently, non scheduled - 3-4 a day,  lasting from 30 to 60 minutes and are often less refreshing than nocturnal sleep.  REVIEW OF SYSTEMS: Out of a complete 14 system review of symptoms, the patient complains only of the following symptoms, and all other reviewed systems are negative.  FSS 56 ESS 11  ALLERGIES: Allergies  Allergen Reactions  . Dilaudid [Hydromorphone Hcl] Anaphylaxis  . Lidocaine Other (See Comments)    SYNCOPE "passed out"  . Penicillins Anaphylaxis, Swelling and Other (See Comments)    Has patient had a PCN reaction causing immediate rash, facial/tongue/throat swelling, SOB or lightheadedness with hypotension: yes Has patient had a PCN reaction causing severe rash  involving mucus membranes or skin necrosis: yes Has patient had a PCN reaction that required hospitalization: no Has patient had a PCN reaction occurring within the last 10 years: no If all of the above answers are "NO", then may proceed with Cephalosporin use.    HOME MEDICATIONS: Outpatient Medications Prior to Visit  Medication Sig Dispense Refill  . albuterol (PROVENTIL HFA;VENTOLIN HFA) 108 (90 BASE) MCG/ACT inhaler Inhale 2 puffs into the lungs every 6 (six) hours as needed for wheezing or shortness of breath.     . cetirizine (ZYRTEC) 10 MG tablet Take 1 tablet (10 mg total) by mouth daily. 30 tablet 0  . diazepam (VALIUM) 5 MG tablet Take 1 tablet (5 mg total) by mouth every 8 (eight) hours as needed for muscle spasms. 15 tablet 0  . doxycycline (VIBRAMYCIN) 100 MG capsule Take 1 capsule (100 mg total) by mouth 2 (two) times daily. 20 capsule 0  . DULoxetine (CYMBALTA) 20 MG capsule Take 20 mg by mouth at bedtime.   2  . fluticasone (FLONASE) 50 MCG/ACT nasal spray Place 2 sprays into both nostrils daily. 16 g 0  . gabapentin (NEURONTIN) 300 MG capsule TAKE ONE CAPSULE (300MG TOTAL) BY MOUTH THREE TIMES DAILY 90 capsule 2  . guaifenesin (ROBITUSSIN) 100 MG/5ML syrup Take 5-10 mLs (100-200 mg total) by mouth every 4 (four) hours as needed for cough. 60 mL 0  . naproxen sodium (ALEVE) 220 MG tablet Take 440 mg by mouth daily as needed (for pain or headache).     . OXYGEN Inhale 2 L into the lungs at bedtime.     . pantoprazole (PROTONIX) 40 MG tablet Take 1 tablet (40 mg total) by mouth 2 (two) times daily before a meal. 180 tablet 0   No facility-administered medications prior to visit.    PAST MEDICAL HISTORY: Past Medical History:  Diagnosis Date  . Anxiety   . Arthritis    lower back, hands  . Asthma   . Bilateral inguinal hernia 05/29/2016   surgery to repair  . Constipation   . DDD (degenerative disc disease), lumbar   . Fatty liver   . GERD (gastroesophageal reflux  disease)   . History of hiatal hernia   . History of kidney stones 2006   passed stone, no surgery  . Pneumonia   . PONV (postoperative nausea and vomiting)    pt also states his O2 drops after surgery  . Radicular pain of left lower extremity   . Sleep apnea    patient uses 2 L oxygen night - no cpap    PAST SURGICAL HISTORY: Past Surgical History:  Procedure Laterality Date  . ANTERIOR CERVICAL DECOMP/DISCECTOMY FUSION N/A 05/26/2017   Procedure: ANTERIOR CERVICAL DECOMPRESSION/DISCECTOMY FUSION TWO LEVELS Cervical five-six, Cervical six-seven;  Surgeon: Phylliss Bob, MD;  Location: [place];  Service: Orthopedics;  Laterality: N/A;  . BIOPSY  04/11/2018   Procedure: BIOPSY;  Surgeon: Otis Brace, MD;  [facility];  Service: Gastroenterology;;  . CHOLECYSTECTOMY    . ESOPHAGEAL DILATION N/A 03/24/2017   Procedure: ESOPHAGEAL DILATION;  Surgeon: Rogene Houston, MD;  [facility];  Service: Endoscopy;  Laterality: N/A;  . ESOPHAGEAL MANOMETRY N/A 04/11/2018   Procedure: ESOPHAGEAL MANOMETRY (EM);  Surgeon: Otis Brace, MD;  [facility];  Service: Gastroenterology;  Laterality: N/A;  . ESOPHAGOGASTRODUODENOSCOPY N/A 03/24/2017   Procedure: ESOPHAGOGASTRODUODENOSCOPY (EGD);  Surgeon: Rogene Houston, MD;  [facility];  Service: Endoscopy;  Laterality: N/A;  1155  . ESOPHAGOGASTRODUODENOSCOPY (EGD) WITH PROPOFOL N/A 04/11/2018   Procedure: ESOPHAGOGASTRODUODENOSCOPY (EGD) WITH PROPOFOL;  Surgeon: Otis Brace, MD;  [facility];  Service: Gastroenterology;  Laterality: N/A;  mano probe to be placed during EGD  . INGUINAL HERNIA REPAIR Bilateral 05/29/2016   Procedure: OPEN HERNIA REPAIR INGUINAL ADULT BILATERAL WITH insertion of MESH;  Surgeon: Fanny Skates, MD;  Location: [place];  Service: General;  Laterality: Bilateral;  . Lipoma removed    . RADIOLOGY WITH ANESTHESIA N/A 06/14/2018   Procedure: MRI LUMBER SPINE  WITHOUT CONTRAST;  Surgeon: Radiologist, Medication, MD;  Location: [place];  Service: Radiology;  Laterality: N/A;  . WISDOM TOOTH EXTRACTION      FAMILY HISTORY: Family History  Problem Relation Age of Onset  . Heart failure Father   . Heart attack Father   . Ovarian cancer Mother   . Arthritis Mother   . Anxiety disorder Mother     SOCIAL HISTORY: Social History   Socioeconomic History  . Marital status: Married    Spouse name: Not on file  . Number of children: Not on file  . Years of education: Not on file  . Highest education level: Not on file  Occupational History  . Not on file  Tobacco Use  . Smoking status: Never Smoker  . Smokeless tobacco: Never Used  Substance and Sexual Activity  . Alcohol use: No  . Drug use: No  . Sexual activity: Not on file  Other Topics Concern  . Not on file  Social History Narrative  . Not on file   Social Determinants of Health   Financial Resource Strain:   . Difficulty of Paying Living Expenses:   Food Insecurity:   . Worried About Charity fundraiser in the Last Year:   . Arboriculturist in the Last Year:   Transportation Needs:   . Film/video editor (Medical):   Marland Kitchen Lack of Transportation (Non-Medical):   Physical Activity:   . Days of Exercise per Week:   . Minutes of Exercise per Session:   Stress:   . Feeling of Stress :   Social Connections:   . Frequency of Communication with Friends and Family:   . Frequency of Social Gatherings with Friends and Family:   . Attends Religious Services:   . Active Member of Clubs or Organizations:   . Attends Archivist Meetings:   Marland Kitchen Marital Status:   Intimate Partner Violence:   . Fear of Current or Ex-Partner:   . Emotionally Abused:   Marland Kitchen Physically Abused:   . Sexually Abused:       PHYSICAL EXAM  Vitals:   07/06/19 1005  Temp: 97.9 F (36.6 C)  Weight: 215 lb (97.5 kg)  Height: 5' 10.5" (1.791 m)   Body mass index is 30.41 kg/m.  Generalized:  Well developed, in no acute distress  Chest: Lungs clear to auscultation bilaterally  Neurological examination  Mentation: Alert oriented to time, place, history taking. Follows all commands speech and language fluent Cranial nerve II-XII: Extraocular movements were full, visual field were full on confrontational test Head turning and shoulder shrug  were normal and symmetric. Motor: The motor testing reveals 5 over 5 strength of all 4 extremities. Good symmetric motor tone is noted throughout.  Sensory: Sensory testing is intact to soft touch on all 4 extremities. No evidence of extinction is noted.  Gait and station: Gait is normal.    DIAGNOSTIC DATA (LABS, IMAGING, TESTING) - I reviewed patient records, labs, notes, testing and imaging myself where available.  Lab Results  Component Value Date   WBC 6.0 03/22/2019   HGB 16.1 03/22/2019   HCT 47.8 03/22/2019   MCV 87.4 03/22/2019   PLT 223 03/22/2019      Component Value Date/Time   NA 138 03/22/2019 2310   K 3.9 03/22/2019 2310   CL 105 03/22/2019 2310   CO2 20 (L) 03/22/2019 2310   GLUCOSE 98 03/22/2019 2310   BUN 19 03/22/2019 2310   CREATININE 1.16 03/22/2019 2310   CALCIUM 9.1 03/22/2019 2310   PROT 6.9 05/24/2017 1740   ALBUMIN 4.1 05/24/2017 1740   AST 33 05/24/2017 1740   ALT 39 05/24/2017 1740   ALKPHOS 65 05/24/2017 1740   BILITOT 0.6 05/24/2017 1740   GFRNONAA >60 03/22/2019 2310   GFRAA >60 03/22/2019 2310   No results found for: CHOL, HDL, LDLCALC, LDLDIRECT, TRIG, CHOLHDL No results found for: HGBA1C Lab Results  Component Value Date   VITAMINB12 720 07/06/2018   Lab Results  Component Value Date   TSH 1.84 03/09/2018      ASSESSMENT AND PLAN 49 y.o. year old male  has a past medical history of Anxiety, Arthritis, Asthma, Bilateral inguinal hernia (05/29/2016), Constipation, DDD (degenerative disc disease), lumbar, Fatty liver, GERD (gastroesophageal reflux disease), History of hiatal hernia,  History of kidney stones (2006), Pneumonia, PONV (postoperative nausea and vomiting), Radicular pain of left lower extremity, and Sleep apnea. here with:  1. OSA on CPAP  - CPAP compliance excellent - Good treatment of AHI  - mask refitting ordered - Encourage patient to use CPAP nightly and > 4 hours each night - F/U in 1 year or sooner if needed   I spent 20  minutes of face-to-face and non-face-to-face time with patient.  This included previsit chart review, lab review, study review, order entry, electronic health record documentation, patient education.  Ward Givens, MSN, NP-C 07/06/2019, 10:07 AM [facility] [address] [place], [place] [address] [phone]

## 2019-07-06 NOTE — Patient Instructions (Signed)
,  Continue using CPAP nightly and greater than 4 hours each night °If your symptoms worsen or you develop new symptoms please let us know.  ° °

## 2019-07-10 NOTE — Progress Notes (Signed)
Community message sent to [employer].

## 2019-07-27 ENCOUNTER — Other Ambulatory Visit: Payer: Self-pay

## 2019-07-27 ENCOUNTER — Ambulatory Visit
Admission: EM | Admit: 2019-07-27 | Discharge: 2019-07-27 | Disposition: A | Discharge status: Home / Self Care | Payer: 59 | Attending: Emergency Medicine | Admitting: Emergency Medicine

## 2019-07-27 ENCOUNTER — Encounter: Payer: Self-pay | Admitting: Emergency Medicine

## 2019-07-27 DIAGNOSIS — R05 Cough: Secondary | ICD-10-CM | POA: Diagnosis not present

## 2019-07-27 DIAGNOSIS — Z20822 Contact with and (suspected) exposure to covid-19: Secondary | ICD-10-CM | POA: Diagnosis not present

## 2019-07-27 DIAGNOSIS — R058 Other specified cough: Secondary | ICD-10-CM

## 2019-07-27 MED ORDER — BENZONATATE 100 MG PO CAPS: 100.0000 mg | ORAL_CAPSULE | Freq: Three times a day (TID) | ORAL | 0 refills | Status: DC

## 2019-07-27 MED ORDER — MENTHOL 3 MG MT LOZG: 1.0000 | LOZENGE | OROMUCOSAL | 12 refills | Status: DC | PRN

## 2019-07-27 MED ORDER — FLUTICASONE PROPIONATE 50 MCG/ACT NA SUSP: 1.0000 | Freq: Every day | NASAL | 0 refills | Status: DC

## 2019-07-27 NOTE — Discharge Instructions (Addendum)
COVID testing ordered.  It will take between 2-7 days for test results.  Someone will contact you regarding abnormal results.    In the meantime: You should remain isolated in your home for 10 days from symptom onset AND greater than 24 hours after symptoms resolution (absence of fever without the use of fever-reducing medication and improvement in respiratory symptoms), whichever is longer Get plenty of rest and push fluids Cepacol was prescribed for sore throat Flonase for congestion Tessalon Perles for cough Use medications daily for symptom relief Use OTC medications like ibuprofen or tylenol as needed fever or pain Call or go to the ED if you have any new or worsening symptoms such as fever, worsening cough, shortness of breath, chest tightness, chest pain, turning blue, changes in mental status, etc..Marland Kitchen

## 2019-07-27 NOTE — ED Notes (Signed)
Obtained send out covid, labeled and placed in lab

## 2019-07-27 NOTE — ED Provider Notes (Signed)
[facility]    CSN: [id number] Arrival date & time: 07/27/19  1627      History   Chief Complaint Chief Complaint  Patient presents with  . Sore Throat    HPI Zachary Nash is a 50 y.o. male.   who went to the urgent care with a complaint of sneezing, sore throat and headache for the past 3 days.  Was recently exposed to COVID-19.  Denies sick exposure to COVID, flu or strep.  Denies recent travel.  Denies aggravating or alleviating symptoms.  Denies previous COVID infection.   Denies fever, chills, fatigue, nasal congestion, rhinorrhea,  cough, SOB, wheezing, chest pain, nausea, vomiting, changes in bowel or bladder habits.    The history is provided by the patient. No language interpreter was used.    Past Medical History:  Diagnosis Date  . Anxiety   . Arthritis    lower back, hands  . Asthma   . Bilateral inguinal hernia 05/29/2016   surgery to repair  . Constipation   . DDD (degenerative disc disease), lumbar   . Fatty liver   . GERD (gastroesophageal reflux disease)   . History of hiatal hernia   . History of kidney stones 2006   passed stone, no surgery  . Pneumonia   . PONV (postoperative nausea and vomiting)    pt also states his O2 drops after surgery  . Radicular pain of left lower extremity   . Sleep apnea    patient uses 2 L oxygen night - no cpap, 07/06/19 on CPAP    Patient Active Problem List   Diagnosis Date Noted  . 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 Excessive daytime sleepiness 01/05/2019  . PLMD (periodic limb movement disorder) 01/05/2019  . Chronic insomnia 01/05/2019  . Upper airway cough syndrome 03/31/2018  . Cough variant asthma 03/28/2018  . DOE (dyspnea on exertion) 03/09/2018  . Nocturnal hypoxemia 03/09/2018  . Spinal cord compression ([place])   . Surgery, elective   . Cervical myelopathy ([place])   . Syncope, vasovagal   . Weakness   . Generalized weakness 05/25/2017  . Left sided numbness 05/24/2017  . Right inguinal  pain 04/20/2017  . Dysphagia 03/24/2017  . Bilateral inguinal hernia 05/29/2016  . 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 Hyperglycemia 05/18/2013  . Radicular pain of left lower extremity   . Dizziness 05/16/2013    Past Surgical History:  Procedure Laterality Date  . ANTERIOR CERVICAL DECOMP/DISCECTOMY FUSION N/A 05/26/2017   Procedure: ANTERIOR CERVICAL DECOMPRESSION/DISCECTOMY FUSION TWO LEVELS Cervical five-six, Cervical six-seven;  Surgeon: Phylliss Bob, MD;  Location: [place];  Service: Orthopedics;  Laterality: N/A;  . BIOPSY  04/11/2018   Procedure: BIOPSY;  Surgeon: Otis Brace, MD;  [facility];  Service: Gastroenterology;;  . CHOLECYSTECTOMY    . ESOPHAGEAL DILATION N/A 03/24/2017   Procedure: ESOPHAGEAL DILATION;  Surgeon: Rogene Houston, MD;  [facility];  Service: Endoscopy;  Laterality: N/A;  . ESOPHAGEAL MANOMETRY N/A 04/11/2018   Procedure: ESOPHAGEAL MANOMETRY (EM);  Surgeon: Otis Brace, MD;  [facility];  Service: Gastroenterology;  Laterality: N/A;  . ESOPHAGOGASTRODUODENOSCOPY N/A 03/24/2017   Procedure: ESOPHAGOGASTRODUODENOSCOPY (EGD);  Surgeon: Rogene Houston, MD;  [facility];  Service: Endoscopy;  Laterality: N/A;  1155  . ESOPHAGOGASTRODUODENOSCOPY (EGD) WITH PROPOFOL N/A 04/11/2018   Procedure: ESOPHAGOGASTRODUODENOSCOPY (EGD) WITH PROPOFOL;  Surgeon: Otis Brace, MD;  [facility];  Service: Gastroenterology;  Laterality: N/A;  mano probe to be placed during EGD  .  INGUINAL HERNIA REPAIR Bilateral 05/29/2016   Procedure: OPEN HERNIA REPAIR INGUINAL ADULT BILATERAL WITH insertion of MESH;  Surgeon: Fanny Skates, MD;  Location: [place];  Service: General;  Laterality: Bilateral;  . Lipoma removed    . RADIOLOGY WITH ANESTHESIA N/A 06/14/2018   Procedure: MRI LUMBER SPINE WITHOUT CONTRAST;  Surgeon: Radiologist, Medication, MD;  Location: [place];  Service: Radiology;  Laterality: N/A;  .  WISDOM TOOTH EXTRACTION         Home Medications    Prior to Admission medications   Medication Sig Start Date End Date Taking? Authorizing Provider  albuterol (PROVENTIL HFA;VENTOLIN HFA) 108 (90 BASE) MCG/ACT inhaler Inhale 2 puffs into the lungs every 6 (six) hours as needed for wheezing or shortness of breath.    Yes [provider]  DULoxetine (CYMBALTA) 20 MG capsule Take 20 mg by mouth at bedtime.  02/03/18  Yes [provider]  gabapentin (NEURONTIN) 300 MG capsule TAKE ONE CAPSULE (300MG TOTAL) BY MOUTH THREE TIMES DAILY 05/02/19  Yes Tanda Rockers, MD  naproxen sodium (ALEVE) 220 MG tablet Take 440 mg by mouth daily as needed (for pain or headache).    Yes [provider]  pantoprazole (PROTONIX) 40 MG tablet Take 1 tablet (40 mg total) by mouth 2 (two) times daily before a meal. 05/03/19  Yes Laurine Blazer A, PA-C  benzonatate (TESSALON) 100 MG capsule Take 1 capsule (100 mg total) by mouth every 8 (eight) hours. 07/27/19   Xzaria Teo, Darrelyn Hillock, FNP  cetirizine (ZYRTEC) 10 MG tablet Take 1 tablet (10 mg total) by mouth daily. 03/29/19   Wurst, [place], PA-C  diazepam (VALIUM) 5 MG tablet Take 1 tablet (5 mg total) by mouth every 8 (eight) hours as needed for muscle spasms. 03/02/19   Petrucelli, Samantha R, PA-C  doxycycline (VIBRAMYCIN) 100 MG capsule Take 1 capsule (100 mg total) by mouth 2 (two) times daily. 06/30/19   Wurst, [place], PA-C  fluticasone (FLONASE) 50 MCG/ACT nasal spray Place 1 spray into both nostrils daily for 14 days. 07/27/19 08/10/19  Ebba Goll, Darrelyn Hillock, FNP  guaifenesin (ROBITUSSIN) 100 MG/5ML syrup Take 5-10 mLs (100-200 mg total) by mouth every 4 (four) hours as needed for cough. 06/30/19   Wurst, [place], PA-C  menthol-cetylpyridinium (CEPACOL) 3 MG lozenge Take 1 lozenge (3 mg total) by mouth as needed for sore throat. 07/27/19   Richey Doolittle, Darrelyn Hillock, FNP  OXYGEN Inhale 2 L into the lungs at bedtime.     [provider]     Family History Family History  Problem Relation Age of Onset  . Heart failure Father   . Heart attack Father   . Ovarian cancer Mother   . Arthritis Mother   . Anxiety disorder Mother     Social History Social History   Tobacco Use  . Smoking status: Never Smoker  . Smokeless tobacco: Never Used  Substance Use Topics  . Alcohol use: No  . Drug use: No     Allergies   Dilaudid [hydromorphone hcl], Lidocaine, and Penicillins   Review of Systems Review of Systems  Constitutional: Negative.   HENT: Positive for sneezing and sore throat.   Respiratory: Negative.   Cardiovascular: Negative.   Gastrointestinal: Negative.   Neurological: Positive for headaches.  All other systems reviewed and are negative.    Physical Exam Triage Vital Signs ED Triage Vitals  Enc Vitals Group     BP      Pulse  Resp      Temp      Temp src      SpO2      Weight      Height      Head Circumference      Peak Flow      Pain Score      Pain Loc      Pain Edu?      Excl. in [place]?    No data found.  Updated Vital Signs BP 127/87 (BP Location: Right Arm)   Pulse 90   Temp 98.5 F (36.9 C) (Oral)   Resp 18   SpO2 96%   Visual Acuity Right Eye Distance:   Left Eye Distance:   Bilateral Distance:    Right Eye Near:   Left Eye Near:    Bilateral Near:     Physical Exam Vitals and nursing note reviewed.  Constitutional:      General: He is not in acute distress.    Appearance: Normal appearance. He is normal weight. He is not ill-appearing or toxic-appearing.  HENT:     Head: Normocephalic.     Right Ear: Tympanic membrane, ear canal and external ear normal. There is no impacted cerumen.     Left Ear: Tympanic membrane, ear canal and external ear normal. There is no impacted cerumen.     Nose: Nose normal. No congestion.     Mouth/Throat:     Mouth: Mucous membranes are moist.     Pharynx: Oropharynx is clear. No oropharyngeal exudate or posterior  oropharyngeal erythema.     Tonsils: 1+ on the right. 1+ on the left.  Cardiovascular:     Rate and Rhythm: Normal rate and regular rhythm.     Pulses: Normal pulses.     Heart sounds: Normal heart sounds. No murmur.  Pulmonary:     Effort: Pulmonary effort is normal. No respiratory distress.     Breath sounds: Normal breath sounds. No wheezing or rhonchi.  Chest:     Chest wall: No tenderness.  Neurological:     Mental Status: He is alert and oriented to person, place, and time.      UC Treatments / Results  Labs (all labs ordered are listed, but only abnormal results are displayed) Labs Reviewed - No data to display  EKG   Radiology No results found.  Procedures Procedures (including critical care time)  Medications Ordered in UC Medications - No data to display  Initial Impression / Assessment and Plan / UC Course  I have reviewed the triage vital signs and the nursing notes.  Pertinent labs & imaging results that were available during my care of the patient were reviewed by me and considered in my medical decision making (see chart for details).    Patient stable at discharge.  COVID-19 test was completed patient was advised to quarantine until COVID-19 test result become available.  Work note was given.  Return or go to ED for worsening symptoms.  Final Clinical Impressions(s) / UC Diagnoses   Final diagnoses:  Exposure to COVID-19 virus     Discharge Instructions     COVID testing ordered.  It will take between 2-7 days for test results.  Someone will contact you regarding abnormal results.    In the meantime: You should remain isolated in your home for 10 days from symptom onset AND greater than 24 hours after symptoms resolution (absence of fever without the use of fever-reducing medication and improvement in respiratory  symptoms), whichever is longer Get plenty of rest and push fluids Cepacol was prescribed for sore throat Flonase for  congestion Tessalon Perles for cough Use medications daily for symptom relief Use OTC medications like ibuprofen or tylenol as needed fever or pain Call or go to the ED if you have any new or worsening symptoms such as fever, worsening cough, shortness of breath, chest tightness, chest pain, turning blue, changes in mental status, etc...     ED Prescriptions    Medication Sig Dispense Auth. Provider   benzonatate (TESSALON) 100 MG capsule Take 1 capsule (100 mg total) by mouth every 8 (eight) hours. 30 capsule Mathilda Maguire S, FNP   fluticasone (FLONASE) 50 MCG/ACT nasal spray Place 1 spray into both nostrils daily for 14 days. 16 g Joanann Mies, Darrelyn Hillock, FNP   menthol-cetylpyridinium (CEPACOL) 3 MG lozenge Take 1 lozenge (3 mg total) by mouth as needed for sore throat. 100 tablet Daniela Hernan, Darrelyn Hillock, FNP     PDMP not reviewed this encounter.   Emerson Monte, FNP 07/27/19 1743

## 2019-07-27 NOTE — ED Triage Notes (Signed)
Headache since 07/25/2019.  Sore throat started today.   No fever No poss of taste or smell No runny nose Patient complains of sneezing

## 2019-07-28 LAB — SARS-COV-2, NAA 2 DAY TAT

## 2019-07-28 LAB — NOVEL CORONAVIRUS, NAA: SARS-CoV-2, NAA: NOT DETECTED

## 2019-08-14 ENCOUNTER — Other Ambulatory Visit: Payer: Self-pay

## 2019-08-14 ENCOUNTER — Ambulatory Visit
Admission: EM | Admit: 2019-08-14 | Discharge: 2019-08-14 | Disposition: A | Discharge status: Home / Self Care | Payer: 59 | Attending: Emergency Medicine | Admitting: Emergency Medicine

## 2019-08-14 DIAGNOSIS — M79642 Pain in left hand: Secondary | ICD-10-CM | POA: Diagnosis not present

## 2019-08-14 MED ORDER — PREDNISONE 10 MG PO TABS: 20.0000 mg | ORAL_TABLET | Freq: Every day | ORAL | 0 refills | Status: AC

## 2019-08-14 MED ORDER — PREDNISONE 10 MG PO TABS: 20.0000 mg | ORAL_TABLET | Freq: Every day | ORAL | 0 refills | Status: DC

## 2019-08-14 NOTE — Discharge Instructions (Signed)
Continue to take prednisone and gabapentin as prescribed 5 days course of prednisone was prescribed Follow-up with PCP Return or go to ED for worsening of symptoms

## 2019-08-14 NOTE — ED Triage Notes (Signed)
Nontraumatic left hand pain and redness x 2 weeks.

## 2019-08-14 NOTE — ED Provider Notes (Signed)
Was advised [facility]    CSN: [id number] Arrival date & time: 08/14/19  1331      History   Chief Complaint Chief Complaint  Patient presents with  . Hand Pain    HPI Zachary Nash is a 49 y.o. male.   With history of cervical radiculopathy presented to the urgent care with a complaint of left hand pain and redness for the past 2 to 3 days.  Denies any precipitating event.  Localized the pain to the left hand.  He describes the pain as constant and achy, currently rated at 0 on a scale of 1-10.  He has tried gabapentin, prednisone with mild relief.  His symptoms are made worse with ROM.  He denies similar symptoms in the past.  Denies trauma, injury, chills, nausea, vomiting, diarrhea.   The history is provided by the patient. No language interpreter was used.  Hand Pain    Past Medical History:  Diagnosis Date  . Anxiety   . Arthritis    lower back, hands  . Asthma   . Bilateral inguinal hernia 05/29/2016   surgery to repair  . Constipation   . DDD (degenerative disc disease), lumbar   . Fatty liver   . GERD (gastroesophageal reflux disease)   . History of hiatal hernia   . History of kidney stones 2006   passed stone, no surgery  . Pneumonia   . PONV (postoperative nausea and vomiting)    pt also states his O2 drops after surgery  . Radicular pain of left lower extremity   . Sleep apnea    patient uses 2 L oxygen night - no cpap, 07/06/19 on CPAP    Patient Active Problem List   Diagnosis Date Noted  . 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 Excessive daytime sleepiness 01/05/2019  . PLMD (periodic limb movement disorder) 01/05/2019  . Chronic insomnia 01/05/2019  . Upper airway cough syndrome 03/31/2018  . Cough variant asthma 03/28/2018  . DOE (dyspnea on exertion) 03/09/2018  . Nocturnal hypoxemia 03/09/2018  . Spinal cord compression ([place])   . Surgery, elective   . Cervical myelopathy ([place])   . Syncope, vasovagal   . Weakness   .  Generalized weakness 05/25/2017  . Left sided numbness 05/24/2017  . Right inguinal pain 04/20/2017  . Dysphagia 03/24/2017  . Bilateral inguinal hernia 05/29/2016  . 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 Hyperglycemia 05/18/2013  . Radicular pain of left lower extremity   . Dizziness 05/16/2013    Past Surgical History:  Procedure Laterality Date  . ANTERIOR CERVICAL DECOMP/DISCECTOMY FUSION N/A 05/26/2017   Procedure: ANTERIOR CERVICAL DECOMPRESSION/DISCECTOMY FUSION TWO LEVELS Cervical five-six, Cervical six-seven;  Surgeon: Phylliss Bob, MD;  Location: [facility];  Service: Orthopedics;  Laterality: N/A;  . BIOPSY  04/11/2018   Procedure: BIOPSY;  Surgeon: Otis Brace, MD;  Location: [facility];  Service: Gastroenterology;;  . CHOLECYSTECTOMY    . ESOPHAGEAL DILATION N/A 03/24/2017   Procedure: ESOPHAGEAL DILATION;  Surgeon: Rogene Houston, MD;  Location: [facility];  Service: Endoscopy;  Laterality: N/A;  . ESOPHAGEAL MANOMETRY N/A 04/11/2018   Procedure: ESOPHAGEAL MANOMETRY (EM);  Surgeon: Otis Brace, MD;  Location: [facility];  Service: Gastroenterology;  Laterality: N/A;  . ESOPHAGOGASTRODUODENOSCOPY N/A 03/24/2017   Procedure: ESOPHAGOGASTRODUODENOSCOPY (EGD);  Surgeon: Rogene Houston, MD;  [facility];  Service: Endoscopy;  Laterality: N/A;  1155  . ESOPHAGOGASTRODUODENOSCOPY (EGD) WITH PROPOFOL N/A 04/11/2018   Procedure: ESOPHAGOGASTRODUODENOSCOPY (EGD) WITH PROPOFOL;  Surgeon:  Otis Brace, MD;  Location: [facility];  Service: Gastroenterology;  Laterality: N/A;  mano probe to be placed during EGD  . INGUINAL HERNIA REPAIR Bilateral 05/29/2016   Procedure: OPEN HERNIA REPAIR INGUINAL ADULT BILATERAL WITH insertion of MESH;  Surgeon: Fanny Skates, MD;  Location: [facility];  Service: General;  Laterality: Bilateral;  . Lipoma removed    . RADIOLOGY WITH ANESTHESIA N/A 06/14/2018   Procedure: MRI LUMBER SPINE WITHOUT CONTRAST;  Surgeon:  Radiologist, Medication, MD;  Location: [place];  Service: Radiology;  Laterality: N/A;  . WISDOM TOOTH EXTRACTION         Home Medications    Prior to Admission medications   Medication Sig Start Date End Date Taking? Authorizing Provider  albuterol (PROVENTIL HFA;VENTOLIN HFA) 108 (90 BASE) MCG/ACT inhaler Inhale 2 puffs into the lungs every 6 (six) hours as needed for wheezing or shortness of breath.     [provider]  benzonatate (TESSALON) 100 MG capsule Take 1 capsule (100 mg total) by mouth every 8 (eight) hours. 07/27/19   Jerolene Kupfer, Darrelyn Hillock, FNP  cetirizine (ZYRTEC) 10 MG tablet Take 1 tablet (10 mg total) by mouth daily. 03/29/19   Wurst, [place], PA-C  diazepam (VALIUM) 5 MG tablet Take 1 tablet (5 mg total) by mouth every 8 (eight) hours as needed for muscle spasms. 03/02/19   Petrucelli, Samantha R, PA-C  doxycycline (VIBRAMYCIN) 100 MG capsule Take 1 capsule (100 mg total) by mouth 2 (two) times daily. 06/30/19   Wurst, [place], PA-C  DULoxetine (CYMBALTA) 20 MG capsule Take 20 mg by mouth at bedtime.  02/03/18   [provider]  fluticasone (FLONASE) 50 MCG/ACT nasal spray Place 1 spray into both nostrils daily for 14 days. 07/27/19 08/10/19  Sandi Towe, Darrelyn Hillock, FNP  gabapentin (NEURONTIN) 300 MG capsule TAKE ONE CAPSULE (300MG TOTAL) BY MOUTH THREE TIMES DAILY 05/02/19   Tanda Rockers, MD  guaifenesin (ROBITUSSIN) 100 MG/5ML syrup Take 5-10 mLs (100-200 mg total) by mouth every 4 (four) hours as needed for cough. 06/30/19   Wurst, [place], PA-C  menthol-cetylpyridinium (CEPACOL) 3 MG lozenge Take 1 lozenge (3 mg total) by mouth as needed for sore throat. 07/27/19   Fransico Sciandra, Darrelyn Hillock, FNP  naproxen sodium (ALEVE) 220 MG tablet Take 440 mg by mouth daily as needed (for pain or headache).     [provider]  OXYGEN Inhale 2 L into the lungs at bedtime.     [provider]  pantoprazole (PROTONIX) 40 MG tablet Take 1 tablet (40 mg total) by mouth 2  (two) times daily before a meal. 05/03/19   Minus Liberty, PA-C  predniSONE (DELTASONE) 10 MG tablet Take 2 tablets (20 mg total) by mouth daily for 5 days. 08/14/19 08/19/19  Emerson Monte, FNP    Family History Family History  Problem Relation Age of Onset  . Heart failure Father   . Heart attack Father   . Ovarian cancer Mother   . Arthritis Mother   . Anxiety disorder Mother     Social History Social History   Tobacco Use  . Smoking status: Never Smoker  . Smokeless tobacco: Never Used  Substance Use Topics  . Alcohol use: No  . Drug use: No     Allergies   Dilaudid [hydromorphone hcl], Lidocaine, and Penicillins   Review of Systems Review of Systems  Constitutional: Negative.   Respiratory: Negative.   Cardiovascular: Negative.   Musculoskeletal: Positive for arthralgias.  All other systems  reviewed and are negative.    Physical Exam Triage Vital Signs ED Triage Vitals [08/14/19 1338]  Enc Vitals Group     BP 124/90     Pulse Rate 96     Resp 18     Temp 98.9 F (37.2 C)     Temp src      SpO2 95 %     Weight      Height      Head Circumference      Peak Flow      Pain Score 0     Pain Loc      Pain Edu?      Excl. in [place]?    No data found.  Updated Vital Signs BP 124/90   Pulse 96   Temp 98.9 F (37.2 C)   Resp 18   SpO2 95%   Visual Acuity Right Eye Distance:   Left Eye Distance:   Bilateral Distance:    Right Eye Near:   Left Eye Near:    Bilateral Near:     Physical Exam Vitals and nursing note reviewed.  Constitutional:      General: He is not in acute distress.    Appearance: Normal appearance. He is normal weight. He is not ill-appearing, toxic-appearing or diaphoretic.  Cardiovascular:     Rate and Rhythm: Normal rate and regular rhythm.     Pulses: Normal pulses.     Heart sounds: Normal heart sounds. No murmur. No friction rub. No gallop.   Pulmonary:     Effort: Pulmonary effort is normal. No respiratory  distress.     Breath sounds: Normal breath sounds. No stridor. No wheezing, rhonchi or rales.  Chest:     Chest wall: No tenderness.  Musculoskeletal:        General: Tenderness present.     Right hand: Normal.     Left hand: Tenderness present.     Comments: The left hand is read any obvious asymmetry or deformity when compared to the right.  No swelling, atrophy or obvious deformity.  No surface trauma, open wound, nail avulsion, tissue avulsion, partial or complete amputation, subungual hematoma, bony deformity.  Normal cascade of finger.  ROM limited range of motion on the little finger with mild crepitus.  Pulse and capillary refill intact.  Neurological:     Mental Status: He is alert.      UC Treatments / Results  Labs (all labs ordered are listed, but only abnormal results are displayed) Labs Reviewed - No data to display  EKG   Radiology No results found.  Procedures Procedures (including critical care time)  Medications Ordered in UC Medications - No data to display  Initial Impression / Assessment and Plan / UC Course  I have reviewed the triage vital signs and the nursing notes.  Pertinent labs & imaging results that were available during my care of the patient were reviewed by me and considered in my medical decision making (see chart for details).    Patient is stable at discharge.  Was advised to continue to take gabapentin and prednisone as prescribed.  5 days course of prednisone will be added.  Follow-up with PCP  Final Clinical Impressions(s) / UC Diagnoses   Final diagnoses:  Left hand pain     Discharge Instructions     Continue to take prednisone and gabapentin as prescribed 5 days course of prednisone was prescribed Follow-up with PCP Return or go to ED for worsening of symptoms  ED Prescriptions    Medication Sig Dispense Auth. Provider   predniSONE (DELTASONE) 10 MG tablet Take 2 tablets (20 mg total) by mouth daily for 5 days. 10  tablet Tiffanyann Deroo, Darrelyn Hillock, FNP     PDMP not reviewed this encounter.   Emerson Monte, FNP 08/14/19 1402

## 2019-10-23 ENCOUNTER — Ambulatory Visit
Admission: EM | Admit: 2019-10-23 | Discharge: 2019-10-23 | Disposition: A | Discharge status: Home / Self Care | Payer: 59 | Attending: Family Medicine | Admitting: Family Medicine

## 2019-10-23 ENCOUNTER — Telehealth: Payer: 59

## 2019-10-23 ENCOUNTER — Other Ambulatory Visit: Payer: Self-pay

## 2019-10-23 ENCOUNTER — Ambulatory Visit (INDEPENDENT_AMBULATORY_CARE_PROVIDER_SITE_OTHER): Payer: 59

## 2019-10-23 ENCOUNTER — Encounter: Payer: Self-pay | Admitting: Emergency Medicine

## 2019-10-23 DIAGNOSIS — J209 Acute bronchitis, unspecified: Secondary | ICD-10-CM | POA: Diagnosis not present

## 2019-10-23 DIAGNOSIS — R05 Cough: Secondary | ICD-10-CM | POA: Diagnosis not present

## 2019-10-23 DIAGNOSIS — R0602 Shortness of breath: Secondary | ICD-10-CM | POA: Diagnosis not present

## 2019-10-23 MED ORDER — DEXAMETHASONE SODIUM PHOSPHATE 10 MG/ML IJ SOLN
10.0000 mg | Freq: Once | INTRAMUSCULAR | Status: AC
  Administered 2019-10-23: 10 mg via INTRAMUSCULAR

## 2019-10-23 MED ORDER — PREDNISONE 10 MG (21) PO TBPK: ORAL_TABLET | Freq: Every day | ORAL | 0 refills | Status: AC

## 2019-10-23 NOTE — Discharge Instructions (Addendum)
You have bronchitis  Your chest xray was negative today  You have received a steroid injection in the office today to help decrease inflammation to make it easier to breathe and help decrease the cough  I have sent in a prednisone taper for you to take for 6 days. 6 tablets on day one, 5 tablets on day two, 4 tablets on day three, 3 tablets on day four, 2 tablets on day five, and 1 tablet on day six.  Use the albuterol neb every 4 hours for the next 24 hours. You may skip if you are asleep.  Follow up with this office or with primary care if you are not improving over the next few days  Follow up with the ER for high fever, trouble swallowing, trouble breathing, other concerning symptoms

## 2019-10-23 NOTE — ED Provider Notes (Signed)
[facility]   [id number] 10/23/19 Arrival Time: 1338   SUBJECTIVE:  Zachary Nash is a 49 y.o. male who presents with complaint of nasal congestion, post-nasal drainage, and a persistent cough. Onset abrupt approximately 2 weeks ago. Overall fatigued. SOB: moderate. Wheezing: mild. OTC treatment: has had a course of doxycycline from this office at the beginning of the month. Has also been seen in primary care and is currently taking azithromycin. Social History   Tobacco Use  Smoking Status Never Smoker  Smokeless Tobacco Never Used    ROS: As per HPI.   OBJECTIVE:  Vitals:   10/23/19 1411 10/23/19 1412  BP:  112/80  Pulse:  98  Resp:  17  Temp:  98.7 F (37.1 C)  TempSrc:  Oral  SpO2:  97%  Weight: 180 lb (81.6 kg)   Height: 5' 11"  (1.803 m)      General appearance: alert; no distress HEENT: nasal congestion; clear runny nose; throat irritation secondary to post-nasal drainage Neck: supple without LAD Lungs: wheezing noted to bilateral lung bases Skin: warm and dry Psychological: alert and cooperative; normal mood and affect  Results for orders placed or performed during the hospital encounter of 07/27/19  Novel Coronavirus, NAA ([facility])   Specimen: Nasal Swab; Nasopharyngeal(NP) swabs in vial transport medium   NASOPHARYNGE  Result Value Ref Range   SARS-CoV-2, NAA Not Detected Not Detected  SARS-COV-2, NAA 2 DAY TAT   NASOPHARYNGE  Result Value Ref Range   SARS-CoV-2, NAA 2 DAY TAT Performed     Labs Reviewed - No data to display  DG Chest 2 View  Result Date: 10/23/2019 CLINICAL DATA:  Cough and shortness of breath. EXAM: CHEST - 2 VIEW COMPARISON:  PA and lateral chest 03/29/2018. FINDINGS: The lungs are clear. Heart size is normal. No pneumothorax or pleural fluid. No acute or focal bony abnormality. IMPRESSION: Negative chest. Electronically Signed   By: Inge Rise M.D.   On: 10/23/2019 14:28    Allergies  Allergen Reactions  .  Dilaudid [Hydromorphone Hcl] Anaphylaxis  . Lidocaine Other (See Comments)    SYNCOPE "passed out"  . Penicillins Anaphylaxis, Swelling and Other (See Comments)    Has patient had a PCN reaction causing immediate rash, facial/tongue/throat swelling, SOB or lightheadedness with hypotension: yes Has patient had a PCN reaction causing severe rash involving mucus membranes or skin necrosis: yes Has patient had a PCN reaction that required hospitalization: no Has patient had a PCN reaction occurring within the last 10 years: no If all of the above answers are "NO", then may proceed with Cephalosporin use.    Past Medical History:  Diagnosis Date  . Anxiety   . Arthritis    lower back, hands  . Asthma   . Bilateral inguinal hernia 05/29/2016   surgery to repair  . Constipation   . DDD (degenerative disc disease), lumbar   . Fatty liver   . GERD (gastroesophageal reflux disease)   . History of hiatal hernia   . History of kidney stones 2006   passed stone, no surgery  . Pneumonia   . PONV (postoperative nausea and vomiting)    pt also states his O2 drops after surgery  . Radicular pain of left lower extremity   . Sleep apnea    patient uses 2 L oxygen night - no cpap, 07/06/19 on CPAP   Social History   Socioeconomic History  . Marital status: Married    Spouse name: Not on file  .  Number of children: Not on file  . Years of education: Not on file  . Highest education level: Not on file  Occupational History  . Not on file  Tobacco Use  . Smoking status: Never Smoker  . Smokeless tobacco: Never Used  Vaping Use  . Vaping Use: Never used  Substance and Sexual Activity  . Alcohol use: No  . Drug use: No  . Sexual activity: Not on file  Other Topics Concern  . Not on file  Social History Narrative  . Not on file   Social Determinants of Health   Financial Resource Strain:   . Difficulty of Paying Living Expenses:   Food Insecurity:   . Worried About Sales executive in the Last Year:   . Arboriculturist in the Last Year:   Transportation Needs:   . Film/video editor (Medical):   Marland Kitchen Lack of Transportation (Non-Medical):   Physical Activity:   . Days of Exercise per Week:   . Minutes of Exercise per Session:   Stress:   . Feeling of Stress :   Social Connections:   . Frequency of Communication with Friends and Family:   . Frequency of Social Gatherings with Friends and Family:   . Attends Religious Services:   . Active Member of Clubs or Organizations:   . Attends Archivist Meetings:   Marland Kitchen Marital Status:   Intimate Partner Violence:   . Fear of Current or Ex-Partner:   . Emotionally Abused:   Marland Kitchen Physically Abused:   . Sexually Abused:    Family History  Problem Relation Age of Onset  . Heart failure Father   . Heart attack Father   . Ovarian cancer Mother   . Arthritis Mother   . Anxiety disorder Mother      ASSESSMENT & PLAN:  1. Acute bronchitis, unspecified organism     Meds ordered this encounter  Medications  . dexamethasone (DECADRON) injection 10 mg  . predniSONE (STERAPRED UNI-PAK 21 TAB) 10 MG (21) TBPK tablet    Sig: Take by mouth daily for 6 days. Take 6 tablets on day 1, 5 tablets on day 2, 4 tablets on day 3, 3 tablets on day 4, 2 tablets on day 5, 1 tablet on day 6    Dispense:  21 tablet    Refill:  0    Order Specific Question:   Supervising Provider    Answer:   Chase Picket [id number]   Chest xray negative today Decadron 81m IM in office today Continue azithromycin Continue with albuterol neb treatments at home Prescribed prednisone taper  OTC symptom care as needed. Will plan f/u with PCP or here as needed.  Reviewed expectations re: course of current medical issues. Questions answered. Outlined signs and symptoms indicating need for more acute intervention. Patient verbalized understanding. After Visit Summary given.           MFaustino Congress NP 10/23/19 1637

## 2019-10-23 NOTE — ED Triage Notes (Signed)
Cough, fatigue, fever that spikes at nigh,  head and chest congestion x 1 week.  Had neg covid test last week. Has been on 2 courses of abx.

## 2019-11-08 DIAGNOSIS — J069 Acute upper respiratory infection, unspecified: Secondary | ICD-10-CM | POA: Diagnosis not present

## 2019-11-21 ENCOUNTER — Other Ambulatory Visit (HOSPITAL_COMMUNITY): Payer: Self-pay | Admitting: Internal Medicine

## 2019-11-21 ENCOUNTER — Other Ambulatory Visit: Payer: Self-pay

## 2019-11-21 ENCOUNTER — Ambulatory Visit (HOSPITAL_COMMUNITY)
Admission: RE | Admit: 2019-11-21 | Discharge: 2019-11-21 | Disposition: A | Discharge status: Home / Self Care | Payer: Medicare HMO | Source: Ambulatory Visit | Attending: Internal Medicine | Admitting: Internal Medicine

## 2019-11-21 DIAGNOSIS — R059 Cough, unspecified: Secondary | ICD-10-CM

## 2019-11-21 DIAGNOSIS — R05 Cough: Secondary | ICD-10-CM | POA: Diagnosis not present

## 2019-11-21 DIAGNOSIS — G4733 Obstructive sleep apnea (adult) (pediatric): Secondary | ICD-10-CM | POA: Diagnosis not present

## 2019-11-24 DIAGNOSIS — K219 Gastro-esophageal reflux disease without esophagitis: Secondary | ICD-10-CM | POA: Diagnosis not present

## 2019-11-24 DIAGNOSIS — M4322 Fusion of spine, cervical region: Secondary | ICD-10-CM | POA: Diagnosis not present

## 2019-11-24 DIAGNOSIS — K220 Achalasia of cardia: Secondary | ICD-10-CM | POA: Diagnosis not present

## 2019-11-24 DIAGNOSIS — R944 Abnormal results of kidney function studies: Secondary | ICD-10-CM | POA: Diagnosis not present

## 2019-11-24 DIAGNOSIS — G4730 Sleep apnea, unspecified: Secondary | ICD-10-CM | POA: Diagnosis not present

## 2019-11-24 DIAGNOSIS — Z0001 Encounter for general adult medical examination with abnormal findings: Secondary | ICD-10-CM | POA: Diagnosis not present

## 2019-11-24 DIAGNOSIS — M25512 Pain in left shoulder: Secondary | ICD-10-CM | POA: Diagnosis not present

## 2019-11-24 DIAGNOSIS — E876 Hypokalemia: Secondary | ICD-10-CM | POA: Diagnosis not present

## 2019-11-24 DIAGNOSIS — K5904 Chronic idiopathic constipation: Secondary | ICD-10-CM | POA: Diagnosis not present

## 2019-12-05 DIAGNOSIS — J342 Deviated nasal septum: Secondary | ICD-10-CM | POA: Insufficient documentation

## 2019-12-05 DIAGNOSIS — J343 Hypertrophy of nasal turbinates: Secondary | ICD-10-CM | POA: Diagnosis not present

## 2019-12-05 DIAGNOSIS — G4733 Obstructive sleep apnea (adult) (pediatric): Secondary | ICD-10-CM | POA: Diagnosis not present

## 2019-12-05 DIAGNOSIS — J0391 Acute recurrent tonsillitis, unspecified: Secondary | ICD-10-CM | POA: Diagnosis not present

## 2019-12-05 DIAGNOSIS — H903 Sensorineural hearing loss, bilateral: Secondary | ICD-10-CM | POA: Diagnosis not present

## 2019-12-13 ENCOUNTER — Other Ambulatory Visit: Payer: Self-pay | Admitting: Otolaryngology

## 2019-12-20 ENCOUNTER — Telehealth: Payer: Self-pay

## 2019-12-20 NOTE — Telephone Encounter (Signed)
Pt has called the sleep lab today stating that he has started with a new Insurance as of October 29, 2019 [facility]). Due to medicare guidelines, pt has to start the process over in regards to be seeing seen face to face. That visit needs to include info about pt having OSA, pt on current therapy and if compliant/noncompliant. If pt is compliant a new Rx needs to be given to patient to be setup under new insurance.

## 2019-12-22 DIAGNOSIS — G4733 Obstructive sleep apnea (adult) (pediatric): Secondary | ICD-10-CM | POA: Diagnosis not present

## 2020-01-01 ENCOUNTER — Other Ambulatory Visit: Payer: Self-pay

## 2020-01-01 ENCOUNTER — Ambulatory Visit (INDEPENDENT_AMBULATORY_CARE_PROVIDER_SITE_OTHER): Payer: Medicare HMO | Admitting: Adult Health

## 2020-01-01 ENCOUNTER — Encounter: Payer: Self-pay | Admitting: Adult Health

## 2020-01-01 VITALS — BP 120/84 | HR 77 | Ht 71.0 in | Wt 205.0 lb

## 2020-01-01 DIAGNOSIS — Z9989 Dependence on other enabling machines and devices: Secondary | ICD-10-CM | POA: Diagnosis not present

## 2020-01-01 DIAGNOSIS — G4733 Obstructive sleep apnea (adult) (pediatric): Secondary | ICD-10-CM

## 2020-01-01 NOTE — Progress Notes (Signed)
PATIENT: Zachary Nash DOB: May 28, 1970  REASON FOR VISIT: follow up HISTORY FROM: patient  HISTORY OF PRESENT ILLNESS: Today 01/01/20:  Zachary Nash is a 49 year old male with a history of obstructive sleep apnea on CPAP.  His download indicates that he use his machine nightly for compliance of 100%.  He uses machine greater than 4 hours 27 days for compliance of 90%.  On average he uses his machine 6 hours and 4 minutes.  His residual AHI is 2.4 on 5 to 12 cm of water with EPR 1.  Leak in the 95th percentile is 21.5 L/min.  He reports that at the end of this month he will be having surgery to fix his deviated septum and have his tonsils removed.  He states that currently he is finding it hard to use the nasal pillows due to his deviated septum.    HISTORY Zachary Nash is a 49 year old male with a history of obstructive sleep apnea on CPAP.  His download indicates that he uses machine 29 out of 30 days for compliance of 97%.  He uses machine greater than 4 hours each night.  On average he uses his machine 6 hours and 43 minutes.  His residual AHI is 2 on 5 to 12 cm of water with EPR of 1.  Leak in the 95th percentile is 23.6 L/min.  He does feel the mask leaking at times.  REVIEW OF SYSTEMS: Out of a complete 14 system review of symptoms, the patient complains only of the following symptoms, and all other reviewed systems are negative.  FSS 54 ESS 20  ALLERGIES: Allergies  Allergen Reactions  . Dilaudid [Hydromorphone Hcl] Anaphylaxis  . Lidocaine Other (See Comments)    SYNCOPE "passed out"  . Penicillins Anaphylaxis, Swelling and Other (See Comments)    Has patient had a PCN reaction causing immediate rash, facial/tongue/throat swelling, SOB or lightheadedness with hypotension: yes Has patient had a PCN reaction causing severe rash involving mucus membranes or skin necrosis: yes Has patient had a PCN reaction that required hospitalization: no Has patient had a PCN reaction occurring  within the last 10 years: no If all of the above answers are "NO", then may proceed with Cephalosporin use.    HOME MEDICATIONS: Outpatient Medications Prior to Visit  Medication Sig Dispense Refill  . albuterol (PROVENTIL HFA;VENTOLIN HFA) 108 (90 BASE) MCG/ACT inhaler Inhale 2 puffs into the lungs every 6 (six) hours as needed for wheezing or shortness of breath.     . benzonatate (TESSALON) 100 MG capsule Take 1 capsule (100 mg total) by mouth every 8 (eight) hours. 30 capsule 0  . cetirizine (ZYRTEC) 10 MG tablet Take 1 tablet (10 mg total) by mouth daily. 30 tablet 0  . diazepam (VALIUM) 5 MG tablet Take 1 tablet (5 mg total) by mouth every 8 (eight) hours as needed for muscle spasms. 15 tablet 0  . doxycycline (VIBRAMYCIN) 100 MG capsule Take 1 capsule (100 mg total) by mouth 2 (two) times daily. 20 capsule 0  . DULoxetine (CYMBALTA) 20 MG capsule Take 20 mg by mouth at bedtime.   2  . fluticasone (FLONASE) 50 MCG/ACT nasal spray Place 1 spray into both nostrils daily for 14 days. 16 g 0  . gabapentin (NEURONTIN) 300 MG capsule TAKE ONE CAPSULE (300MG TOTAL) BY MOUTH THREE TIMES DAILY 90 capsule 2  . guaifenesin (ROBITUSSIN) 100 MG/5ML syrup Take 5-10 mLs (100-200 mg total) by mouth every 4 (four) hours as needed for  cough. 60 mL 0  . menthol-cetylpyridinium (CEPACOL) 3 MG lozenge Take 1 lozenge (3 mg total) by mouth as needed for sore throat. 100 tablet 12  . naproxen sodium (ALEVE) 220 MG tablet Take 440 mg by mouth daily as needed (for pain or headache).     . OXYGEN Inhale 2 L into the lungs at bedtime.     . pantoprazole (PROTONIX) 40 MG tablet Take 1 tablet (40 mg total) by mouth 2 (two) times daily before a meal. 180 tablet 0   No facility-administered medications prior to visit.    PAST MEDICAL HISTORY: Past Medical History:  Diagnosis Date  . Anxiety   . Arthritis    lower back, hands  . Asthma   . Bilateral inguinal hernia 05/29/2016   surgery to repair  .  Constipation   . DDD (degenerative disc disease), lumbar   . Fatty liver   . GERD (gastroesophageal reflux disease)   . History of hiatal hernia   . History of kidney stones 2006   passed stone, no surgery  . Pneumonia   . PONV (postoperative nausea and vomiting)    pt also states his O2 drops after surgery  . Radicular pain of left lower extremity   . Sleep apnea    patient uses 2 L oxygen night - no cpap, 07/06/19 on CPAP    PAST SURGICAL HISTORY: Past Surgical History:  Procedure Laterality Date  . ANTERIOR CERVICAL DECOMP/DISCECTOMY FUSION N/A 05/26/2017   Procedure: ANTERIOR CERVICAL DECOMPRESSION/DISCECTOMY FUSION TWO LEVELS Cervical five-six, Cervical six-seven;  Surgeon: Phylliss Bob, MD;  Location: [facility];  Service: Orthopedics;  Laterality: N/A;  . BIOPSY  04/11/2018   Procedure: BIOPSY;  Surgeon: Otis Brace, MD;  Location: [facility];  Service: Gastroenterology;;  . CHOLECYSTECTOMY    . ESOPHAGEAL DILATION N/A 03/24/2017   Procedure: ESOPHAGEAL DILATION;  Surgeon: Rogene Houston, MD;  Location: [facility];  Service: Endoscopy;  Laterality: N/A;  . ESOPHAGEAL MANOMETRY N/A 04/11/2018   Procedure: ESOPHAGEAL MANOMETRY (EM);  Surgeon: Otis Brace, MD;  Location: [facility];  Service: Gastroenterology;  Laterality: N/A;  . ESOPHAGOGASTRODUODENOSCOPY N/A 03/24/2017   Procedure: ESOPHAGOGASTRODUODENOSCOPY (EGD);  Surgeon: Rogene Houston, MD;  [facility];  Service: Endoscopy;  Laterality: N/A;  1155  . ESOPHAGOGASTRODUODENOSCOPY (EGD) WITH PROPOFOL N/A 04/11/2018   Procedure: ESOPHAGOGASTRODUODENOSCOPY (EGD) WITH PROPOFOL;  Surgeon: Otis Brace, MD;  Location: [facility];  Service: Gastroenterology;  Laterality: N/A;  mano probe to be placed during EGD  . INGUINAL HERNIA REPAIR Bilateral 05/29/2016   Procedure: OPEN HERNIA REPAIR INGUINAL ADULT BILATERAL WITH insertion of MESH;  Surgeon: Fanny Skates, MD;  Location: [place] [place];  Service: General;   Laterality: Bilateral;  . Lipoma removed    . RADIOLOGY WITH ANESTHESIA N/A 06/14/2018   Procedure: MRI LUMBER SPINE WITHOUT CONTRAST;  Surgeon: Radiologist, Medication, MD;  Location: [place];  Service: Radiology;  Laterality: N/A;  . WISDOM TOOTH EXTRACTION      FAMILY HISTORY: Family History  Problem Relation Age of Onset  . Heart failure Father   . Heart attack Father   . Ovarian cancer Mother   . Arthritis Mother   . Anxiety disorder Mother     SOCIAL HISTORY: Social History   Socioeconomic History  . Marital status: Married    Spouse name: Not on file  . Number of children: Not on file  . Years of education: Not on file  . Highest education level: Not on file  Occupational  History  . Not on file  Tobacco Use  . Smoking status: Never Smoker  . Smokeless tobacco: Never Used  Vaping Use  . Vaping Use: Never used  Substance and Sexual Activity  . Alcohol use: No  . Drug use: No  . Sexual activity: Not on file  Other Topics Concern  . Not on file  Social History Narrative  . Not on file   Social Determinants of Health   Financial Resource Strain:   . Difficulty of Paying Living Expenses: Not on file  Food Insecurity:   . Worried About Charity fundraiser in the Last Year: Not on file  . Ran Out of Food in the Last Year: Not on file  Transportation Needs:   . Lack of Transportation (Medical): Not on file  . Lack of Transportation (Non-Medical): Not on file  Physical Activity:   . Days of Exercise per Week: Not on file  . Minutes of Exercise per Session: Not on file  Stress:   . Feeling of Stress : Not on file  Social Connections:   . Frequency of Communication with Friends and Family: Not on file  . Frequency of Social Gatherings with Friends and Family: Not on file  . Attends Religious Services: Not on file  . Active Member of Clubs or Organizations: Not on file  . Attends Archivist Meetings: Not on file  . Marital Status: Not on file   Intimate Partner Violence:   . Fear of Current or Ex-Partner: Not on file  . Emotionally Abused: Not on file  . Physically Abused: Not on file  . Sexually Abused: Not on file      PHYSICAL EXAM  Vitals:   01/01/20 0815  BP: 120/84  Pulse: 77  Weight: 205 lb (93 kg)  Height: 5' 11"  (1.803 m)   Body mass index is 28.59 kg/m.  Generalized: Well developed, in no acute distress  Chest: Lungs clear to auscultation bilaterally  Neurological examination  Mentation: Alert oriented to time, place, history taking. Follows all commands speech and language fluent Cranial nerve II-XII: Extraocular movements were full, visual field were full on confrontational test Head turning and shoulder shrug  were normal and symmetric. Motor: The motor testing reveals 5 over 5 strength of all 4 extremities. Good symmetric motor tone is noted throughout.  Sensory: Sensory testing is intact to soft touch on all 4 extremities. No evidence of extinction is noted.  Gait and station: Gait is normal.    DIAGNOSTIC DATA (LABS, IMAGING, TESTING) - I reviewed patient records, labs, notes, testing and imaging myself where available.  Lab Results  Component Value Date   WBC 6.0 03/22/2019   HGB 16.1 03/22/2019   HCT 47.8 03/22/2019   MCV 87.4 03/22/2019   PLT 223 03/22/2019      Component Value Date/Time   NA 138 03/22/2019 2310   K 3.9 03/22/2019 2310   CL 105 03/22/2019 2310   CO2 20 (L) 03/22/2019 2310   GLUCOSE 98 03/22/2019 2310   BUN 19 03/22/2019 2310   CREATININE 1.16 03/22/2019 2310   CALCIUM 9.1 03/22/2019 2310   PROT 6.9 05/24/2017 1740   ALBUMIN 4.1 05/24/2017 1740   AST 33 05/24/2017 1740   ALT 39 05/24/2017 1740   ALKPHOS 65 05/24/2017 1740   BILITOT 0.6 05/24/2017 1740   GFRNONAA >60 03/22/2019 2310   GFRAA >60 03/22/2019 2310   No results found for: CHOL, HDL, LDLCALC, LDLDIRECT, TRIG, CHOLHDL No results found  for: HGBA1C Lab Results  Component Value Date   [id number] 353  07/06/2018   Lab Results  Component Value Date   TSH 1.84 03/09/2018      ASSESSMENT AND PLAN 49 y.o. year old male  has a past medical history of Anxiety, Arthritis, Asthma, Bilateral inguinal hernia (05/29/2016), Constipation, DDD (degenerative disc disease), lumbar, Fatty liver, GERD (gastroesophageal reflux disease), History of hiatal hernia, History of kidney stones (2006), Pneumonia, PONV (postoperative nausea and vomiting), Radicular pain of left lower extremity, and Sleep apnea. here with:  1. OSA on CPAP  - CPAP compliance excellent - Good treatment of AHI  -Mask refitting ordered -Also sent James from [facility] a message asking him to reach out to the patient due to some billing issues with his CPAP machine. - Encourage patient to use CPAP nightly and > 4 hours each night -Home sleep study may be warranted after he is healed from surgery. - F/U in 6 months or sooner if needed   I spent 25 minutes of face-to-face and non-face-to-face time with patient.  This included previsit chart review, lab review, study review, order entry, electronic health record documentation, patient education.  Ward Givens, MSN, NP-C 01/01/2020, 8:55 AM [facility] [address], [place], [place] [address] [phone]

## 2020-01-01 NOTE — Patient Instructions (Signed)
Your Plan:  Continue using CPAP nightly and greater than 4 hours each night Mask refitting ordered If your symptoms worsen or you develop new symptoms please let us know.    Thank you for coming to see [place] at [facility]. I hope we have been able to provide you high quality care today.  You may receive a patient satisfaction survey over the next few weeks. We would appreciate your feedback and comments so that we may continue to improve ourselves and the health of our patients.

## 2020-01-09 ENCOUNTER — Ambulatory Visit: Payer: 59 | Admitting: Adult Health

## 2020-01-10 ENCOUNTER — Other Ambulatory Visit (HOSPITAL_COMMUNITY): Payer: Self-pay

## 2020-01-10 DIAGNOSIS — T7840XA Allergy, unspecified, initial encounter: Secondary | ICD-10-CM | POA: Diagnosis not present

## 2020-01-10 DIAGNOSIS — I2510 Atherosclerotic heart disease of native coronary artery without angina pectoris: Secondary | ICD-10-CM | POA: Diagnosis not present

## 2020-01-10 DIAGNOSIS — R918 Other nonspecific abnormal finding of lung field: Secondary | ICD-10-CM | POA: Diagnosis not present

## 2020-01-10 DIAGNOSIS — U071 COVID-19: Secondary | ICD-10-CM | POA: Diagnosis not present

## 2020-01-10 DIAGNOSIS — K449 Diaphragmatic hernia without obstruction or gangrene: Secondary | ICD-10-CM | POA: Diagnosis not present

## 2020-01-10 DIAGNOSIS — R509 Fever, unspecified: Secondary | ICD-10-CM | POA: Diagnosis not present

## 2020-01-10 DIAGNOSIS — R059 Cough, unspecified: Secondary | ICD-10-CM | POA: Diagnosis not present

## 2020-01-10 DIAGNOSIS — Z9049 Acquired absence of other specified parts of digestive tract: Secondary | ICD-10-CM | POA: Diagnosis not present

## 2020-01-11 ENCOUNTER — Telehealth: Payer: Self-pay | Admitting: Nurse Practitioner

## 2020-01-11 NOTE — Telephone Encounter (Signed)
Called to Discuss with patient about Covid symptoms and the use of the monoclonal antibody infusion for those with mild to moderate Covid symptoms and at a high risk of hospitalization.     Pt appears to qualify for this infusion due to co-morbid conditions and/or a member of an at-risk group in accordance with the FDA Emergency Use Authorization.    Unable to reach pt. Voicemail box full. My Chart message sent.   Elder Love, NP

## 2020-01-16 DIAGNOSIS — U071 COVID-19: Secondary | ICD-10-CM | POA: Diagnosis not present

## 2020-01-16 NOTE — Progress Notes (Signed)
Patient called for pre assessment call for upcoming surgery. Pt states he had covid last week with severe symptom of SOB per patient. Patient was given antibody infusion. Patient uses cpap nightly . Anesthesia (DR Elgie Congo) was notified and instructed that surgery will need to be cancelled for next week. KiKi, from Dr Victorio Palm office was notified

## 2020-01-21 DIAGNOSIS — G4733 Obstructive sleep apnea (adult) (pediatric): Secondary | ICD-10-CM | POA: Diagnosis not present

## 2020-01-24 DIAGNOSIS — U071 COVID-19: Secondary | ICD-10-CM | POA: Diagnosis not present

## 2020-01-24 DIAGNOSIS — G4733 Obstructive sleep apnea (adult) (pediatric): Secondary | ICD-10-CM | POA: Diagnosis not present

## 2020-01-31 NOTE — Progress Notes (Signed)
Contacted patient to do pre op call. He states that he wants to postpone until he is sure that he is feel better since testing positive for covid on 01/10/2020. Message left for Kika at Dr. Danie Binder. Advised patient to please call Dr. Danie Binder office and have surgery postponed. He verbalized understanding.

## 2020-02-07 ENCOUNTER — Telehealth: Payer: Self-pay | Admitting: Neurology

## 2020-02-07 ENCOUNTER — Ambulatory Visit (HOSPITAL_BASED_OUTPATIENT_CLINIC_OR_DEPARTMENT_OTHER): Admission: RE | Admit: 2020-02-07 | Payer: Medicare HMO | Source: Home / Self Care | Admitting: Otolaryngology

## 2020-02-07 ENCOUNTER — Encounter (HOSPITAL_BASED_OUTPATIENT_CLINIC_OR_DEPARTMENT_OTHER): Admission: RE | Payer: Self-pay | Source: Home / Self Care

## 2020-02-07 SURGERY — SEPTOPLASTY, NOSE, WITH NASAL TURBINATE REDUCTION
Anesthesia: General | Laterality: Bilateral

## 2020-02-07 NOTE — Telephone Encounter (Signed)
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 If at all possible, he needs this today. Best call back is [phone].

## 2020-02-07 NOTE — Telephone Encounter (Signed)
Printed a 30 day download and provided to the patient. Pt was appreciative

## 2020-02-16 ENCOUNTER — Ambulatory Visit
Admission: EM | Admit: 2020-02-16 | Discharge: 2020-02-16 | Disposition: A | Discharge status: Home / Self Care | Payer: Medicare HMO | Attending: Emergency Medicine | Admitting: Emergency Medicine

## 2020-02-16 ENCOUNTER — Other Ambulatory Visit: Payer: Self-pay

## 2020-02-16 DIAGNOSIS — J029 Acute pharyngitis, unspecified: Secondary | ICD-10-CM | POA: Insufficient documentation

## 2020-02-16 DIAGNOSIS — J069 Acute upper respiratory infection, unspecified: Secondary | ICD-10-CM | POA: Diagnosis not present

## 2020-02-16 DIAGNOSIS — Z1152 Encounter for screening for COVID-19: Secondary | ICD-10-CM | POA: Diagnosis not present

## 2020-02-16 LAB — POCT RAPID STREP A (OFFICE): Rapid Strep A Screen: NEGATIVE

## 2020-02-16 MED ORDER — BENZONATATE 100 MG PO CAPS: 100.0000 mg | ORAL_CAPSULE | Freq: Three times a day (TID) | ORAL | 0 refills | Status: DC

## 2020-02-16 MED ORDER — DEXAMETHASONE 4 MG PO TABS: 4.0000 mg | ORAL_TABLET | Freq: Every day | ORAL | 0 refills | Status: AC

## 2020-02-16 MED ORDER — LIDOCAINE VISCOUS HCL 2 % MT SOLN: 15.0000 mL | OROMUCOSAL | 0 refills | Status: DC | PRN

## 2020-02-16 NOTE — Discharge Instructions (Addendum)
Strep test negative, will send out for culture and we will call you with results  COVID testing ordered.  It will take between 2-7 days for test results.  Someone will contact you regarding abnormal results.    In the meantime: You should remain isolated in your home for 10 days from symptom onset AND greater than 72 hours after symptoms resolution (absence of fever without the use of fever-reducing medication and improvement in respiratory symptoms), whichever is longer Get plenty of rest and push fluids Tessalon Perles prescribed for cough Continue Zyrtec and Flonase for nasal congestion, rhinorrhea or sore throat Decadron was prescribed Lidocaine mouthwash was prescribed for sore throat Use throat lozenges such as Halls, Cepacol, Vicks to soothe throat May use salty warm water to gargle Use medications daily for symptom relief Use OTC medications like ibuprofen or tylenol as needed fever or pain Call or go to the ED if you have any new or worsening symptoms such as fever, worsening cough, shortness of breath, chest tightness, chest pain, turning blue, changes in mental status, etc..Marland Kitchen

## 2020-02-16 NOTE — ED Triage Notes (Signed)
Pt presents with sore throat and headache that began a couple days ago, pt states he had covid pneumonia last month

## 2020-02-16 NOTE — ED Provider Notes (Signed)
[place]   [id number] 02/16/20 Arrival Time: 1710  IW:PYKD THROAT  SUBJECTIVE: History from: patient.  Zachary Nash is a 49 y.o. male presented to the urgent care for complaint of sore throat, headache, cough and congestion for the past couple days.  Denies sick exposure to strep, flu or mono, Covid or precipitating event.  Has tried OTC Zyrtec and Flonase without relief.  Symptoms are made worse with swallowing, but tolerating liquids and own secretions without difficulty.  Reports/ denies previous symptoms in the past.   Denies fever, chills, fatigue, ear pain, sinus pain, rhinorrhea, nasal congestion,  SOB, wheezing, chest pain, nausea, rash, changes in bowel or bladder habits.   .  ROS: As per HPI.  All other pertinent ROS negative.     Past Medical History:  Diagnosis Date  . Anxiety   . Arthritis    lower back, hands  . Asthma   . Bilateral inguinal hernia 05/29/2016   surgery to repair  . Constipation   . DDD (degenerative disc disease), lumbar   . Fatty liver   . GERD (gastroesophageal reflux disease)   . History of hiatal hernia   . History of kidney stones 2006   passed stone, no surgery  . Pneumonia   . PONV (postoperative nausea and vomiting)    pt also states his O2 drops after surgery  . Radicular pain of left lower extremity   . Sleep apnea    patient uses 2 L oxygen night - no cpap, 07/06/19 on CPAP   Past Surgical History:  Procedure Laterality Date  . ANTERIOR CERVICAL DECOMP/DISCECTOMY FUSION N/A 05/26/2017   Procedure: ANTERIOR CERVICAL DECOMPRESSION/DISCECTOMY FUSION TWO LEVELS Cervical five-six, Cervical six-seven;  Surgeon: Phylliss Bob, MD;  Location: [place];  Service: Orthopedics;  Laterality: N/A;  . BIOPSY  04/11/2018   Procedure: BIOPSY;  Surgeon: Otis Brace, MD;  Location: [facility];  Service: Gastroenterology;;  . CHOLECYSTECTOMY    . ESOPHAGEAL DILATION N/A 03/24/2017   Procedure: ESOPHAGEAL DILATION;  Surgeon:  Rogene Houston, MD;  [facility];  Service: Endoscopy;  Laterality: N/A;  . ESOPHAGEAL MANOMETRY N/A 04/11/2018   Procedure: ESOPHAGEAL MANOMETRY (EM);  Surgeon: Otis Brace, MD;  Location: [facility];  Service: Gastroenterology;  Laterality: N/A;  . ESOPHAGOGASTRODUODENOSCOPY N/A 03/24/2017   Procedure: ESOPHAGOGASTRODUODENOSCOPY (EGD);  Surgeon: Rogene Houston, MD;  [facility];  Service: Endoscopy;  Laterality: N/A;  1155  . ESOPHAGOGASTRODUODENOSCOPY (EGD) WITH PROPOFOL N/A 04/11/2018   Procedure: ESOPHAGOGASTRODUODENOSCOPY (EGD) WITH PROPOFOL;  Surgeon: Otis Brace, MD;  Location: [facility];  Service: Gastroenterology;  Laterality: N/A;  mano probe to be placed during EGD  . INGUINAL HERNIA REPAIR Bilateral 05/29/2016   Procedure: OPEN HERNIA REPAIR INGUINAL ADULT BILATERAL WITH insertion of MESH;  Surgeon: Fanny Skates, MD;  Location: [place];  Service: General;  Laterality: Bilateral;  . Lipoma removed    . RADIOLOGY WITH ANESTHESIA N/A 06/14/2018   Procedure: MRI LUMBER SPINE WITHOUT CONTRAST;  Surgeon: Radiologist, Medication, MD;  Location: [place];  Service: Radiology;  Laterality: N/A;  . WISDOM TOOTH EXTRACTION     Allergies  Allergen Reactions  . Dilaudid [Hydromorphone Hcl] Anaphylaxis  . Lidocaine Other (See Comments)    SYNCOPE "passed out"  . Penicillins Anaphylaxis, Swelling and Other (See Comments)    Has patient had a PCN reaction causing immediate rash, facial/tongue/throat swelling, SOB or lightheadedness with hypotension: yes Has patient had a PCN reaction causing severe rash involving mucus membranes  or skin necrosis: yes Has patient had a PCN reaction that required hospitalization: no Has patient had a PCN reaction occurring within the last 10 years: no If all of the above answers are "NO", then may proceed with Cephalosporin use.   No current facility-administered medications on file prior to encounter.   Current Outpatient  Medications on File Prior to Encounter  Medication Sig Dispense Refill  . albuterol (PROVENTIL HFA;VENTOLIN HFA) 108 (90 BASE) MCG/ACT inhaler Inhale 2 puffs into the lungs every 6 (six) hours as needed for wheezing or shortness of breath.     . cetirizine (ZYRTEC) 10 MG tablet Take 1 tablet (10 mg total) by mouth daily. 30 tablet 0  . diazepam (VALIUM) 5 MG tablet Take 1 tablet (5 mg total) by mouth every 8 (eight) hours as needed for muscle spasms. 15 tablet 0  . doxycycline (VIBRAMYCIN) 100 MG capsule Take 1 capsule (100 mg total) by mouth 2 (two) times daily. 20 capsule 0  . DULoxetine (CYMBALTA) 20 MG capsule Take 20 mg by mouth at bedtime.   2  . fluticasone (FLONASE) 50 MCG/ACT nasal spray Place 1 spray into both nostrils daily for 14 days. 16 g 0  . gabapentin (NEURONTIN) 300 MG capsule TAKE ONE CAPSULE (300MG TOTAL) BY MOUTH THREE TIMES DAILY 90 capsule 2  . guaifenesin (ROBITUSSIN) 100 MG/5ML syrup Take 5-10 mLs (100-200 mg total) by mouth every 4 (four) hours as needed for cough. 60 mL 0  . menthol-cetylpyridinium (CEPACOL) 3 MG lozenge Take 1 lozenge (3 mg total) by mouth as needed for sore throat. 100 tablet 12  . naproxen sodium (ALEVE) 220 MG tablet Take 440 mg by mouth daily as needed (for pain or headache).     . OXYGEN Inhale 2 L into the lungs at bedtime.     . pantoprazole (PROTONIX) 40 MG tablet Take 1 tablet (40 mg total) by mouth 2 (two) times daily before a meal. 180 tablet 0   Social History   Socioeconomic History  . Marital status: Married    Spouse name: Not on file  . Number of children: Not on file  . Years of education: Not on file  . Highest education level: Not on file  Occupational History  . Not on file  Tobacco Use  . Smoking status: Never Smoker  . Smokeless tobacco: Never Used  Vaping Use  . Vaping Use: Never used  Substance and Sexual Activity  . Alcohol use: No  . Drug use: No  . Sexual activity: Not on file  Other Topics Concern  . Not on  file  Social History Narrative  . Not on file   Social Determinants of Health   Financial Resource Strain:   . Difficulty of Paying Living Expenses: Not on file  Food Insecurity:   . Worried About Charity fundraiser in the Last Year: Not on file  . Ran Out of Food in the Last Year: Not on file  Transportation Needs:   . Lack of Transportation (Medical): Not on file  . Lack of Transportation (Non-Medical): Not on file  Physical Activity:   . Days of Exercise per Week: Not on file  . Minutes of Exercise per Session: Not on file  Stress:   . Feeling of Stress : Not on file  Social Connections:   . Frequency of Communication with Friends and Family: Not on file  . Frequency of Social Gatherings with Friends and Family: Not on file  . Attends Religious  Services: Not on file  . Active Member of Clubs or Organizations: Not on file  . Attends Archivist Meetings: Not on file  . Marital Status: Not on file  Intimate Partner Violence:   . Fear of Current or Ex-Partner: Not on file  . Emotionally Abused: Not on file  . Physically Abused: Not on file  . Sexually Abused: Not on file   Family History  Problem Relation Age of Onset  . Heart failure Father   . Heart attack Father   . Ovarian cancer Mother   . Arthritis Mother   . Anxiety disorder Mother     OBJECTIVE:  Vitals:   02/16/20 1737  BP: 120/90  Pulse: 87  Resp: 20  Temp: 98.9 F (37.2 C)  SpO2: 98%     General appearance: alert; appears fatigued, but nontoxic, speaking in full sentences and managing own secretions HEENT: NCAT; Ears: EACs clear, TMs pearly gray with visible cone of light, without erythema; Eyes: PERRL, EOMI grossly; Nose: no obvious rhinorrhea; Throat: oropharynx clear, tonsils 1+ and mildly erythematous without white tonsillar exudates, uvula midline Neck: supple without LAD Lungs: CTA bilaterally without adventitious breath sounds; cough present Heart: regular rate and rhythm.  Radial  pulses 2+ symmetrical bilaterally Skin: warm and dry Psychological: alert and cooperative; normal mood and affect  LABS: Results for orders placed or performed during the hospital encounter of 02/16/20 (from the past 24 hour(s))  POCT rapid strep A     Status: None   Collection Time: 02/16/20  5:40 PM  Result Value Ref Range   Rapid Strep A Screen Negative Negative     ASSESSMENT & PLAN:  1. Encounter for screening for COVID-19   2. URI with cough and congestion   3. Sore throat     Meds ordered this encounter  Medications  . lidocaine (XYLOCAINE) 2 % solution    Sig: Use as directed 15 mLs in the mouth or throat as needed for mouth pain.    Dispense:  100 mL    Refill:  0  . benzonatate (TESSALON) 100 MG capsule    Sig: Take 1 capsule (100 mg total) by mouth every 8 (eight) hours.    Dispense:  30 capsule    Refill:  0  . dexamethasone (DECADRON) 4 MG tablet    Sig: Take 1 tablet (4 mg total) by mouth daily for 7 days.    Dispense:  7 tablet    Refill:  0   Discharge instructions   Strep test negative, will send out for culture and we will call you with results  COVID testing ordered.  It will take between 2-7 days for test results.  Someone will contact you regarding abnormal results.    In the meantime: You should remain isolated in your home for 10 days from symptom onset AND greater than 72 hours after symptoms resolution (absence of fever without the use of fever-reducing medication and improvement in respiratory symptoms), whichever is longer Get plenty of rest and push fluids Tessalon Perles prescribed for cough Continue Zyrtec and Flonase for nasal congestion, rhinorrhea or sore throat Decadron was prescribed Lidocaine mouthwash was prescribed for sore throat Use throat lozenges such as Halls, Cepacol, Vicks to soothe throat May use salty warm water to gargle Use medications daily for symptom relief Use OTC medications like ibuprofen or tylenol as needed  fever or pain Call or go to the ED if you have any new or worsening symptoms such as  fever, worsening cough, shortness of breath, chest tightness, chest pain, turning blue, changes in mental status, etc...  Reviewed expectations re: course of current medical issues. Questions answered. Outlined signs and symptoms indicating need for more acute intervention. Patient verbalized understanding. After Visit Summary given.        Emerson Monte, [place] 02/16/20 1814

## 2020-02-18 LAB — NOVEL CORONAVIRUS, NAA: SARS-CoV-2, NAA: NOT DETECTED

## 2020-02-18 LAB — SARS-COV-2, NAA 2 DAY TAT

## 2020-02-19 LAB — CULTURE, GROUP A STREP (THRC)

## 2020-02-21 DIAGNOSIS — G4733 Obstructive sleep apnea (adult) (pediatric): Secondary | ICD-10-CM | POA: Diagnosis not present

## 2020-03-22 DIAGNOSIS — G4733 Obstructive sleep apnea (adult) (pediatric): Secondary | ICD-10-CM | POA: Diagnosis not present

## 2020-04-09 DIAGNOSIS — K224 Dyskinesia of esophagus: Secondary | ICD-10-CM | POA: Diagnosis not present

## 2020-04-09 DIAGNOSIS — K5904 Chronic idiopathic constipation: Secondary | ICD-10-CM | POA: Diagnosis not present

## 2020-04-09 DIAGNOSIS — R1319 Other dysphagia: Secondary | ICD-10-CM | POA: Diagnosis not present

## 2020-04-22 DIAGNOSIS — G4733 Obstructive sleep apnea (adult) (pediatric): Secondary | ICD-10-CM | POA: Diagnosis not present

## 2020-04-23 DIAGNOSIS — G4733 Obstructive sleep apnea (adult) (pediatric): Secondary | ICD-10-CM | POA: Diagnosis not present

## 2020-04-28 ENCOUNTER — Other Ambulatory Visit: Payer: Self-pay

## 2020-04-28 ENCOUNTER — Encounter: Payer: Self-pay | Admitting: Emergency Medicine

## 2020-04-28 ENCOUNTER — Ambulatory Visit
Admission: EM | Admit: 2020-04-28 | Discharge: 2020-04-28 | Disposition: A | Discharge status: Home / Self Care | Payer: Medicare HMO | Attending: Emergency Medicine | Admitting: Emergency Medicine

## 2020-04-28 DIAGNOSIS — J069 Acute upper respiratory infection, unspecified: Secondary | ICD-10-CM

## 2020-04-28 DIAGNOSIS — R52 Pain, unspecified: Secondary | ICD-10-CM

## 2020-04-28 DIAGNOSIS — R509 Fever, unspecified: Secondary | ICD-10-CM

## 2020-04-28 DIAGNOSIS — K120 Recurrent oral aphthae: Secondary | ICD-10-CM | POA: Diagnosis not present

## 2020-04-28 DIAGNOSIS — Z1152 Encounter for screening for COVID-19: Secondary | ICD-10-CM | POA: Diagnosis not present

## 2020-04-28 DIAGNOSIS — R519 Headache, unspecified: Secondary | ICD-10-CM

## 2020-04-28 MED ORDER — CETIRIZINE HCL 10 MG PO TABS: 10.0000 mg | ORAL_TABLET | Freq: Every day | ORAL | 0 refills | Status: AC

## 2020-04-28 MED ORDER — TRIAMCINOLONE ACETONIDE 0.1 % MT PSTE: 1.0000 "application " | PASTE | Freq: Two times a day (BID) | OROMUCOSAL | 12 refills | Status: DC

## 2020-04-28 MED ORDER — DEXAMETHASONE 4 MG PO TABS: 4.0000 mg | ORAL_TABLET | Freq: Every day | ORAL | 0 refills | Status: AC

## 2020-04-28 MED ORDER — BENZONATATE 100 MG PO CAPS: ORAL_CAPSULE | ORAL | 0 refills | Status: DC

## 2020-04-28 NOTE — ED Triage Notes (Signed)
Fever, body aches, sore in mouth, cough and congestion, headache, chills x 4 days

## 2020-04-28 NOTE — Discharge Instructions (Addendum)
COVID-19, flu A/B testing ordered.  It will take between 2-7 days for test results.  Someone will contact you regarding abnormal results.    Get plenty of rest and push fluids Tessalon Perles prescribed for cough Zyrtec for nasal congestion, runny nose, and/or sore throat Decadron was prescribed Oralone paste was prescribed for more sore Use medications daily for symptom relief Use OTC medications like ibuprofen or tylenol as needed fever or pain Call or go to the ED if you have any new or worsening symptoms such as fever, worsening cough, shortness of breath, chest tightness, chest pain, turning blue, changes in mental status, etc..Marland Kitchen

## 2020-04-28 NOTE — ED Provider Notes (Signed)
Zachary Nash   [id number] 04/28/20 Arrival Time: 1026   CC: COVID symptoms  SUBJECTIVE: History from: patient.  BENNET KUJAWA is a 50 y.o. male who presented to the urgent care for complaint of chills and fever,  cough, nasal congestion, body aches and headache for the past 4 days.  Denies sick exposure to COVID, flu or strep.  Denies recent travel.  Has tried OTC medication without relief.  Denies alleviating or aggravating factors.  Denies previous symptoms in the past.   Denies fever, chills, fatigue, sinus pain, rhinorrhea, sore throat, SOB, wheezing, chest pain, nausea, changes in bowel or bladder habits.    ROS: As per HPI.  All other pertinent ROS negative.      Past Medical History:  Diagnosis Date  . Anxiety   . Arthritis    lower back, hands  . Asthma   . Bilateral inguinal hernia 05/29/2016   surgery to repair  . Constipation   . DDD (degenerative disc disease), lumbar   . Fatty liver   . GERD (gastroesophageal reflux disease)   . History of hiatal hernia   . History of kidney stones 2006   passed stone, no surgery  . Pneumonia   . PONV (postoperative nausea and vomiting)    pt also states his O2 drops after surgery  . Radicular pain of left lower extremity   . Sleep apnea    patient uses 2 L oxygen night - no cpap, 07/06/19 on CPAP   Past Surgical History:  Procedure Laterality Date  . ANTERIOR CERVICAL DECOMP/DISCECTOMY FUSION N/A 05/26/2017   Procedure: ANTERIOR CERVICAL DECOMPRESSION/DISCECTOMY FUSION TWO LEVELS Cervical five-six, Cervical six-seven;  Surgeon: Phylliss Bob, MD;  Location: [place];  Service: Orthopedics;  Laterality: N/A;  . BIOPSY  04/11/2018   Procedure: BIOPSY;  Surgeon: Otis Brace, MD;  [facility];  Service: Gastroenterology;;  . CHOLECYSTECTOMY    . ESOPHAGEAL DILATION N/A 03/24/2017   Procedure: ESOPHAGEAL DILATION;  Surgeon: Rogene Houston, MD;  [facility];  Service: Endoscopy;  Laterality:  N/A;  . ESOPHAGEAL MANOMETRY N/A 04/11/2018   Procedure: ESOPHAGEAL MANOMETRY (EM);  Surgeon: Otis Brace, MD;  [facility];  Service: Gastroenterology;  Laterality: N/A;  . ESOPHAGOGASTRODUODENOSCOPY N/A 03/24/2017   Procedure: ESOPHAGOGASTRODUODENOSCOPY (EGD);  Surgeon: Rogene Houston, MD;  [facility];  Service: Endoscopy;  Laterality: N/A;  1155  . ESOPHAGOGASTRODUODENOSCOPY (EGD) WITH PROPOFOL N/A 04/11/2018   Procedure: ESOPHAGOGASTRODUODENOSCOPY (EGD) WITH PROPOFOL;  Surgeon: Otis Brace, MD;  [facility];  Service: Gastroenterology;  Laterality: N/A;  mano probe to be placed during EGD  . INGUINAL HERNIA REPAIR Bilateral 05/29/2016   Procedure: OPEN HERNIA REPAIR INGUINAL ADULT BILATERAL WITH insertion of MESH;  Surgeon: Fanny Skates, MD;  Location: [place];  Service: General;  Laterality: Bilateral;  . Lipoma removed    . RADIOLOGY WITH ANESTHESIA N/A 06/14/2018   Procedure: MRI LUMBER SPINE WITHOUT CONTRAST;  Surgeon: Radiologist, Medication, MD;  Location: [place];  Service: Radiology;  Laterality: N/A;  . WISDOM TOOTH EXTRACTION     Allergies  Allergen Reactions  . Dilaudid [Hydromorphone Hcl] Anaphylaxis  . Lidocaine Other (See Comments)    SYNCOPE "passed out"  . Penicillins Anaphylaxis, Swelling and Other (See Comments)    Has patient had a PCN reaction causing immediate rash, facial/tongue/throat swelling, SOB or lightheadedness with hypotension: yes Has patient had a PCN reaction causing severe rash involving mucus membranes or skin necrosis: yes Has patient had a  PCN reaction that required hospitalization: no Has patient had a PCN reaction occurring within the last 10 years: no If all of the above answers are "NO", then may proceed with Cephalosporin use.   No current facility-administered medications on file prior to encounter.   Current Outpatient Medications on File Prior to Encounter  Medication Sig Dispense Refill  .  albuterol (PROVENTIL HFA;VENTOLIN HFA) 108 (90 BASE) MCG/ACT inhaler Inhale 2 puffs into the lungs every 6 (six) hours as needed for wheezing or shortness of breath.     . diazepam (VALIUM) 5 MG tablet Take 1 tablet (5 mg total) by mouth every 8 (eight) hours as needed for muscle spasms. 15 tablet 0  . doxycycline (VIBRAMYCIN) 100 MG capsule Take 1 capsule (100 mg total) by mouth 2 (two) times daily. 20 capsule 0  . DULoxetine (CYMBALTA) 20 MG capsule Take 20 mg by mouth at bedtime.   2  . fluticasone (FLONASE) 50 MCG/ACT nasal spray Place 1 spray into both nostrils daily for 14 days. 16 g 0  . gabapentin (NEURONTIN) 300 MG capsule TAKE ONE CAPSULE (300MG TOTAL) BY MOUTH THREE TIMES DAILY 90 capsule 2  . guaifenesin (ROBITUSSIN) 100 MG/5ML syrup Take 5-10 mLs (100-200 mg total) by mouth every 4 (four) hours as needed for cough. 60 mL 0  . lidocaine (XYLOCAINE) 2 % solution Use as directed 15 mLs in the mouth or throat as needed for mouth pain. 100 mL 0  . menthol-cetylpyridinium (CEPACOL) 3 MG lozenge Take 1 lozenge (3 mg total) by mouth as needed for sore throat. 100 tablet 12  . naproxen sodium (ALEVE) 220 MG tablet Take 440 mg by mouth daily as needed (for pain or headache).     . OXYGEN Inhale 2 L into the lungs at bedtime.     . pantoprazole (PROTONIX) 40 MG tablet Take 1 tablet (40 mg total) by mouth 2 (two) times daily before a meal. 180 tablet 0   Social History   Socioeconomic History  . Marital status: Married    Spouse name: Not on file  . Number of children: Not on file  . Years of education: Not on file  . Highest education level: Not on file  Occupational History  . Not on file  Tobacco Use  . Smoking status: Never Smoker  . Smokeless tobacco: Never Used  Vaping Use  . Vaping Use: Never used  Substance and Sexual Activity  . Alcohol use: No  . Drug use: No  . Sexual activity: Not on file  Other Topics Concern  . Not on file  Social History Narrative  . Not on file    Social Determinants of Health   Financial Resource Strain: Not on file  Food Insecurity: Not on file  Transportation Needs: Not on file  Physical Activity: Not on file  Stress: Not on file  Social Connections: Not on file  Intimate Partner Violence: Not on file   Family History  Problem Relation Age of Onset  . Heart failure Father   . Heart attack Father   . Ovarian cancer Mother   . Arthritis Mother   . Anxiety disorder Mother     OBJECTIVE:  Vitals:   04/28/20 1118 04/28/20 1119  BP:  134/88  Pulse:  86  Resp:  19  Temp:  98.5 F (36.9 C)  TempSrc:  Oral  SpO2:  95%  Weight: 216 lb (98 kg)   Height: 5' 10"  (1.778 m)      General  appearance: alert; appears fatigued, but nontoxic; speaking in full sentences and tolerating own secretions HEENT: NCAT; Ears: EACs clear, TMs pearly gray; Eyes: PERRL.  EOM grossly intact. Sinuses: nontender; Nose: nares patent without rhinorrhea, Throat: Canker sore present,oropharynx clear, tonsils non erythematous or enlarged, uvula midline  Neck: supple without LAD Lungs: unlabored respirations, symmetrical air entry; cough: moderate; no respiratory distress; CTAB Heart: regular rate and rhythm.  Radial pulses 2+ symmetrical bilaterally Skin: warm and dry Psychological: alert and cooperative; normal mood and affect  LABS:  No results found for this or any previous visit (from the past 24 hour(s)).   ASSESSMENT & PLAN:  1. Encounter for screening for COVID-19   2. Chills with fever   3. URI with cough and congestion   4. Body aches   5. Acute nonintractable headache, unspecified headache type   6. Canker sore     Meds ordered this encounter  Medications  . dexamethasone (DECADRON) 4 MG tablet    Sig: Take 1 tablet (4 mg total) by mouth daily for 7 days.    Dispense:  7 tablet    Refill:  0  . benzonatate (TESSALON) 100 MG capsule    Sig: Take 1 tablet by mouth every 4-8 hours as needed for cough    Dispense:  30  capsule    Refill:  0  . cetirizine (ZYRTEC ALLERGY) 10 MG tablet    Sig: Take 1 tablet (10 mg total) by mouth daily.    Dispense:  30 tablet    Refill:  0  . triamcinolone (ORALONE) 0.1 % paste    Sig: Use as directed 1 application in the mouth or throat 2 (two) times daily.    Dispense:  5 g    Refill:  12    Discharge Instructions  COVID-19, flu A/B testing ordered.  It will take between 2-7 days for test results.  Someone will contact you regarding abnormal results.    Get plenty of rest and push fluids Tessalon Perles prescribed for cough Zyrtec for nasal congestion, runny nose, and/or sore throat Decadron was prescribed Oralone paste was prescribed for more sore Use medications daily for symptom relief Use OTC medications like ibuprofen or tylenol as needed fever or pain Call or go to the ED if you have any new or worsening symptoms such as fever, worsening cough, shortness of breath, chest tightness, chest pain, turning blue, changes in mental status, etc...   Reviewed expectations re: course of current medical issues. Questions answered. Outlined signs and symptoms indicating need for more acute intervention. Patient verbalized understanding. After Visit Summary given.         Emerson Monte, FNP 04/28/20 1200

## 2020-05-01 ENCOUNTER — Telehealth: Payer: Medicare HMO | Admitting: Family Medicine

## 2020-05-01 DIAGNOSIS — R0989 Other specified symptoms and signs involving the circulatory and respiratory systems: Secondary | ICD-10-CM

## 2020-05-01 DIAGNOSIS — U071 COVID-19: Secondary | ICD-10-CM

## 2020-05-01 DIAGNOSIS — K1379 Other lesions of oral mucosa: Secondary | ICD-10-CM

## 2020-05-01 DIAGNOSIS — R110 Nausea: Secondary | ICD-10-CM | POA: Diagnosis not present

## 2020-05-01 LAB — COVID-19, FLU A+B NAA
Influenza A, NAA: NOT DETECTED
Influenza B, NAA: NOT DETECTED
SARS-CoV-2, NAA: DETECTED — AB

## 2020-05-01 MED ORDER — HYDROGEN PEROXIDE 1.5 % MT GEL: 1.0000 | Freq: Four times a day (QID) | OROMUCOSAL | 0 refills | Status: DC | PRN

## 2020-05-01 MED ORDER — AZITHROMYCIN 250 MG PO TABS: ORAL_TABLET | ORAL | 0 refills | Status: DC

## 2020-05-01 MED ORDER — ONDANSETRON HCL 4 MG PO TABS: 4.0000 mg | ORAL_TABLET | Freq: Three times a day (TID) | ORAL | 0 refills | Status: DC | PRN

## 2020-05-01 NOTE — Progress Notes (Signed)
[name], Zachary Nash are scheduled for a virtual visit with your provider today.    Just as we do with appointments in the office, we must obtain your consent to participate.  Your consent will be active for this visit and any virtual visit you may have with one of our providers in the next 365 days.    If you have a MyChart account, I can also send a copy of this consent to you electronically.  All virtual visits are billed to your insurance company just like a traditional visit in the office.  As this is a virtual visit, video technology does not allow for your provider to perform a traditional examination.  This may limit your provider's ability to fully assess your condition.  If your provider identifies any concerns that need to be evaluated in person or the need to arrange testing such as labs, EKG, etc, we will make arrangements to do so.    Although advances in technology are sophisticated, we cannot ensure that it will always work on either your end or our end.  If the connection with a video visit is poor, we may have to switch to a telephone visit.  With either a video or telephone visit, we are not always able to ensure that we have a secure connection.   I need to obtain your verbal consent now.   Are you willing to proceed with your visit today?   [name] has provided verbal consent on 05/01/2020 for a virtual visit (video or telephone).   Cammie Sickle, FNP 05/01/2020  12:19 PM  Virtual Visit via Video Note  I connected with Zachary Nash on 05/01/20 at 12:00 PM EST by a video enabled telemedicine application and verified that I am speaking with the correct person using two identifiers.  Location: Patient: Home Provider: [facility]   I discussed the limitations of evaluation and management by telemedicine and the availability of in person appointments. The patient expressed understanding and agreed to proceed.  History of Present Illness: Ladonte Verstraete is  a 50 year old male that presents via video with complaints of symptoms related to positive COVID-19.  Patient was diagnosed with COVID-19 earlier this morning.  He has been having symptoms since 04/24/2020.  He initially tested for Covid on 04/28/2020.  Patient has complaints of a mouth sore that has been worsening since that time.  He has been attempting salt water gargles to alleviate this problem without success.  Also, patient has complaints of chest congestion, rhinorrhea, persistent cough, some shortness of breath, and headache that has been unrelieved by over-the-counter medications.  Patient has been utilizing [employer] and albuterol inhaler without sustained relief. Of note, patient is not vaccinated against COVID 19.   URI  The current episode started in the past 7 days. There has been no fever. Associated symptoms include congestion, coughing, headaches, nausea and rhinorrhea. Pertinent negatives include no abdominal pain, ear pain, plugged ear sensation, sinus pain, vomiting or wheezing. He has tried inhaler use for the symptoms. The treatment provided no relief.  Mouth Lesions  The current episode started 5 to 7 days ago. The onset was gradual. The problem is moderate. The symptoms are relieved by one or more prescription drugs. The symptoms are aggravated by eating and drinking. Associated symptoms include nausea, congestion, headaches, mouth sores, rhinorrhea, cough and URI. Pertinent negatives include no fever, no decreased vision, no eye itching, no abdominal pain, no vomiting, no ear pain, no wheezing, no  eye discharge, no eye pain and no eye redness. He has been eating less than usual.      Past Medical History:  Diagnosis Date  . Anxiety   . Arthritis    lower back, hands  . Asthma   . Bilateral inguinal hernia 05/29/2016   surgery to repair  . Constipation   . DDD (degenerative disc disease), lumbar   . Fatty liver   . GERD (gastroesophageal reflux disease)   . History  of hiatal hernia   . History of kidney stones 2006   passed stone, no surgery  . Pneumonia   . PONV (postoperative nausea and vomiting)    pt also states his O2 drops after surgery  . Radicular pain of left lower extremity   . Sleep apnea    patient uses 2 L oxygen night - no cpap, 07/06/19 on CPAP   Social History   Socioeconomic History  . Marital status: Married    Spouse name: Not on file  . Number of children: Not on file  . Years of education: Not on file  . Highest education level: Not on file  Occupational History  . Not on file  Tobacco Use  . Smoking status: Never Smoker  . Smokeless tobacco: Never Used  Vaping Use  . Vaping Use: Never used  Substance and Sexual Activity  . Alcohol use: No  . Drug use: No  . Sexual activity: Not on file  Other Topics Concern  . Not on file  Social History Narrative  . Not on file   Social Determinants of Health   Financial Resource Strain: Not on file  Food Insecurity: Not on file  Transportation Needs: Not on file  Physical Activity: Not on file  Stress: Not on file  Social Connections: Not on file  Intimate Partner Violence: Not on file    There is no immunization history on file for this patient. Review of Systems  Constitutional: Positive for malaise/fatigue. Negative for chills and fever.  HENT: Positive for congestion, mouth sores and rhinorrhea. Negative for ear pain and sinus pain.   Eyes: Negative for pain, discharge, redness and itching.  Respiratory: Positive for cough. Negative for wheezing.   Gastrointestinal: Positive for nausea. Negative for abdominal pain and vomiting.  Genitourinary: Negative.   Musculoskeletal: Negative.   Neurological: Positive for headaches.  Psychiatric/Behavioral: Negative.      Assessment and Plan: 1. COVID-19 Discussed COVID 19 symptoms at length.  Patient has had prior COVID-19 infection and received monoclonal antibody infusion at that time.  Patient is outside window to  receive being that symptoms started on 04/24/2020.  Discussed Covid precautions per CDC guidelines 2. Nausea Advance diet as tolerated - ondansetron (ZOFRAN) 4 MG tablet; Take 1 tablet (4 mg total) by mouth every 8 (eight) hours as needed for nausea or vomiting.  Dispense: 30 tablet; Refill: 0  3. Chest congestion  - azithromycin (ZITHROMAX) 250 MG tablet; Take 500 mg today and 250 mg on days 2-5  Dispense: 6 tablet; Refill: 0  4. Symptoms of upper respiratory infection (URI) - azithromycin (ZITHROMAX) 250 MG tablet; Take 500 mg today and 250 mg on days 2-5  Dispense: 6 tablet; Refill: 0  5. Sore in mouth - Hydrogen Peroxide 1.5 % GEL; Use as directed 1 each in the mouth or throat every 6 (six) hours as needed.  Dispense: 15 g; Refill: 0   Follow Up Instructions:    I discussed the assessment and treatment plan with the  patient. The patient was provided an opportunity to ask questions and all were answered. The patient agreed with the plan and demonstrated an understanding of the instructions.   The patient was advised to call back or seek an in-person evaluation if the symptoms worsen or if the condition fails to improve as anticipated.  I provided 10 minutes of non-face-to-face time during this encounter.  Initially video, transition to telephone due to technical difficulties.  Donia Pounds  APRN, MSN, FNP-C [facility] [address] [place], [place] [address] [phone]

## 2020-05-01 NOTE — Patient Instructions (Addendum)
Peroxyl solution as directed for mouth ulcers.  Azithromycin 500 mg today and 250 mg on days 2 through 5 Zofran 4 mg every 8 hours as needed for nausea.  Advance diet as tolerated. Continue supportive care, including rest and increasing fluid intake.  Thank you for allowing [place] to be a part of your care  Ondansetron tablets What is this medicine? ONDANSETRON (on DAN se tron) is used to treat nausea and vomiting caused by chemotherapy. It is also used to prevent or treat nausea and vomiting after surgery. This medicine may be used for other purposes; ask your health care provider or pharmacist if you have questions. COMMON BRAND NAME(S): Zofran What should I tell my health care provider before I take this medicine? They need to know if you have any of these conditions:  heart disease  history of irregular heartbeat  liver disease  low levels of magnesium or potassium in the blood  an unusual or allergic reaction to ondansetron, granisetron, other medicines, foods, dyes, or preservatives  pregnant or trying to get pregnant  breast-feeding How should I use this medicine? Take this medicine by mouth with a glass of water. Follow the directions on your prescription label. Take your doses at regular intervals. Do not take your medicine more often than directed. Talk to your pediatrician regarding the use of this medicine in children. Special care may be needed. Overdosage: If you think you have taken too much of this medicine contact a poison control center or emergency room at once. NOTE: This medicine is only for you. Do not share this medicine with others. What if I miss a dose? If you miss a dose, take it as soon as you can. If it is almost time for your next dose, take only that dose. Do not take double or extra doses. What may interact with this medicine? Do not take this medicine with any of the following medications:  apomorphine  certain medicines for fungal infections like  fluconazole, itraconazole, ketoconazole, posaconazole, voriconazole  cisapride  dronedarone  pimozide  thioridazine This medicine may also interact with the following medications:  carbamazepine  certain medicines for depression, anxiety, or psychotic disturbances  fentanyl  linezolid  MAOIs like Carbex, Eldepryl, Marplan, Nardil, and Parnate  methylene blue (injected into a vein)  other medicines that prolong the QT interval (cause an abnormal heart rhythm) like dofetilide, ziprasidone  phenytoin  rifampicin  tramadol This list may not describe all possible interactions. Give your health care provider a list of all the medicines, herbs, non-prescription drugs, or dietary supplements you use. Also tell them if you smoke, drink alcohol, or use illegal drugs. Some items may interact with your medicine. What should I watch for while using this medicine? Check with your doctor or health care professional right away if you have any sign of an allergic reaction. What side effects may I notice from receiving this medicine? Side effects that you should report to your doctor or health care professional as soon as possible:  allergic reactions like skin rash, itching or hives, swelling of the face, lips or tongue  breathing problems  confusion  dizziness  fast or irregular heartbeat  feeling faint or lightheaded, falls  fever and chills  loss of balance or coordination  seizures  sweating  swelling of the hands or feet  tightness in the chest  tremors  unusually weak or tired Side effects that usually do not require medical attention (report to your doctor or health care professional  if they continue or are bothersome):  constipation or diarrhea  headache This list may not describe all possible side effects. Call your doctor for medical advice about side effects. You may report side effects to FDA at 1-800-FDA-1088. Where should I keep my medicine? Keep out  of the reach of children. Store between 2 and 30 degrees C (36 and 86 degrees F). Throw away any unused medicine after the expiration date. NOTE: This sheet is a summary. It may not cover all possible information. If you have questions about this medicine, talk to your doctor, pharmacist, or health care provider.  2021 Elsevier/Gold Standard (2018-03-08 07:16:43) Azithromycin tablets What is this medicine? AZITHROMYCIN (az ith roe MYE sin) is a macrolide antibiotic. It is used to treat or prevent certain kinds of bacterial infections. It will not work for colds, flu, or other viral infections. This medicine may be used for other purposes; ask your health care provider or pharmacist if you have questions. COMMON BRAND NAME(S): Zithromax, Zithromax Tri-Pak, Zithromax Z-Pak What should I tell my health care provider before I take this medicine? They need to know if you have any of these conditions:  history of blood diseases, like leukemia  history of irregular heartbeat  kidney disease  liver disease  myasthenia gravis  an unusual or allergic reaction to azithromycin, erythromycin, other macrolide antibiotics, foods, dyes, or preservatives  pregnant or trying to get pregnant  breast-feeding How should I use this medicine? Take this medicine by mouth with a full glass of water. Follow the directions on the prescription label. The tablets can be taken with food or on an empty stomach. If the medicine upsets your stomach, take it with food. Take your medicine at regular intervals. Do not take your medicine more often than directed. Take all of your medicine as directed even if you think your are better. Do not skip doses or stop your medicine early. Talk to your pediatrician regarding the use of this medicine in children. While this drug may be prescribed for children as young as 6 months for selected conditions, precautions do apply. Overdosage: If you think you have taken too much of this  medicine contact a poison control center or emergency room at once. NOTE: This medicine is only for you. Do not share this medicine with others. What if I miss a dose? If you miss a dose, take it as soon as you can. If it is almost time for your next dose, take only that dose. Do not take double or extra doses. What may interact with this medicine? Do not take this medicine with any of the following medications:  cisapride  dronedarone  pimozide  thioridazine This medicine may also interact with the following medications:  antacids that contain aluminum or magnesium  birth control pills  colchicine  cyclosporine  digoxin  ergot alkaloids like dihydroergotamine, ergotamine  nelfinavir  other medicines that prolong the QT interval (an abnormal heart rhythm)  phenytoin  warfarin This list may not describe all possible interactions. Give your health care provider a list of all the medicines, herbs, non-prescription drugs, or dietary supplements you use. Also tell them if you smoke, drink alcohol, or use illegal drugs. Some items may interact with your medicine. What should I watch for while using this medicine? Tell your doctor or healthcare provider if your symptoms do not start to get better or if they get worse. This medicine may cause serious skin reactions. They can happen weeks to months after starting  the medicine. Contact your healthcare provider right away if you notice fevers or flu-like symptoms with a rash. The rash may be red or purple and then turn into blisters or peeling of the skin. Or, you might notice a red rash with swelling of the face, lips or lymph nodes in your neck or under your arms. Do not treat diarrhea with over the counter products. Contact your doctor if you have diarrhea that lasts more than 2 days or if it is severe and watery. This medicine can make you more sensitive to the sun. Keep out of the sun. If you cannot avoid being in the sun, wear  protective clothing and use sunscreen. Do not use sun lamps or tanning beds/booths. What side effects may I notice from receiving this medicine? Side effects that you should report to your doctor or health care professional as soon as possible:  allergic reactions like skin rash, itching or hives, swelling of the face, lips, or tongue  bloody or watery diarrhea  breathing problems  chest pain  fast, irregular heartbeat  muscle weakness  rash, fever, and swollen lymph nodes  redness, blistering, peeling, or loosening of the skin, including inside the mouth  signs and symptoms of liver injury like dark yellow or brown urine; general ill feeling or flu-like symptoms; light-colored stools; loss of appetite; nausea; right upper belly pain; unusually weak or tired; yellowing of the eyes or skin  white patches or sores in the mouth  unusually weak or tired Side effects that usually do not require medical attention (report to your doctor or health care professional if they continue or are bothersome):  diarrhea  nausea  stomach pain  vomiting This list may not describe all possible side effects. Call your doctor for medical advice about side effects. You may report side effects to FDA at 1-800-FDA-1088. Where should I keep my medicine? Keep out of the reach of children. Store at room temperature between 15 and 30 degrees C (59 and 86 degrees F). Throw away any unused medicine after the expiration date. NOTE: This sheet is a summary. It may not cover all possible information. If you have questions about this medicine, talk to your doctor, pharmacist, or health care provider.  2021 Elsevier/Gold Standard (2018-06-23 17:19:20)

## 2020-05-02 ENCOUNTER — Other Ambulatory Visit: Payer: Self-pay

## 2020-05-02 ENCOUNTER — Emergency Department (HOSPITAL_COMMUNITY)
Admission: EM | Admit: 2020-05-02 | Discharge: 2020-05-02 | Disposition: A | Discharge status: Home / Self Care | Payer: Medicare HMO | Attending: Emergency Medicine | Admitting: Emergency Medicine

## 2020-05-02 ENCOUNTER — Emergency Department (HOSPITAL_COMMUNITY): Payer: Medicare HMO

## 2020-05-02 ENCOUNTER — Encounter (HOSPITAL_COMMUNITY): Payer: Self-pay | Admitting: *Deleted

## 2020-05-02 DIAGNOSIS — Z7951 Long term (current) use of inhaled steroids: Secondary | ICD-10-CM | POA: Diagnosis not present

## 2020-05-02 DIAGNOSIS — R059 Cough, unspecified: Secondary | ICD-10-CM | POA: Diagnosis not present

## 2020-05-02 DIAGNOSIS — U071 COVID-19: Secondary | ICD-10-CM | POA: Diagnosis not present

## 2020-05-02 DIAGNOSIS — R0602 Shortness of breath: Secondary | ICD-10-CM

## 2020-05-02 DIAGNOSIS — R0789 Other chest pain: Secondary | ICD-10-CM | POA: Diagnosis present

## 2020-05-02 DIAGNOSIS — J1282 Pneumonia due to coronavirus disease 2019: Secondary | ICD-10-CM | POA: Insufficient documentation

## 2020-05-02 DIAGNOSIS — J45909 Unspecified asthma, uncomplicated: Secondary | ICD-10-CM | POA: Insufficient documentation

## 2020-05-02 DIAGNOSIS — R079 Chest pain, unspecified: Secondary | ICD-10-CM | POA: Diagnosis not present

## 2020-05-02 DIAGNOSIS — R000 Tachycardia, unspecified: Secondary | ICD-10-CM | POA: Insufficient documentation

## 2020-05-02 LAB — CBC WITH DIFFERENTIAL/PLATELET
Abs Immature Granulocytes: 0.04 10*3/uL (ref 0.00–0.07)
Basophils Absolute: 0 10*3/uL (ref 0.0–0.1)
Basophils Relative: 0 %
Eosinophils Absolute: 0.1 10*3/uL (ref 0.0–0.5)
Eosinophils Relative: 1 %
HCT: 46.1 % (ref 39.0–52.0)
Hemoglobin: 15.5 g/dL (ref 13.0–17.0)
Immature Granulocytes: 1 %
Lymphocytes Relative: 17 %
Lymphs Abs: 1.5 10*3/uL (ref 0.7–4.0)
MCH: 28.9 pg (ref 26.0–34.0)
MCHC: 33.6 g/dL (ref 30.0–36.0)
MCV: 86 fL (ref 80.0–100.0)
Monocytes Absolute: 0.6 10*3/uL (ref 0.1–1.0)
Monocytes Relative: 7 %
Neutro Abs: 6.5 10*3/uL (ref 1.7–7.7)
Neutrophils Relative %: 74 %
Platelets: 289 10*3/uL (ref 150–400)
RBC: 5.36 MIL/uL (ref 4.22–5.81)
RDW: 12.4 % (ref 11.5–15.5)
WBC: 8.7 10*3/uL (ref 4.0–10.5)
nRBC: 0 % (ref 0.0–0.2)

## 2020-05-02 LAB — BASIC METABOLIC PANEL
Anion gap: 8 (ref 5–15)
BUN: 21 mg/dL — ABNORMAL HIGH (ref 6–20)
CO2: 24 mmol/L (ref 22–32)
Calcium: 9.2 mg/dL (ref 8.9–10.3)
Chloride: 105 mmol/L (ref 98–111)
Creatinine, Ser: 1.3 mg/dL — ABNORMAL HIGH (ref 0.61–1.24)
GFR, Estimated: >60 mL/min (ref >60)
Glucose, Bld: 118 mg/dL — ABNORMAL HIGH (ref 70–99)
Potassium: 4 mmol/L (ref 3.5–5.1)
Sodium: 137 mmol/L (ref 135–145)

## 2020-05-02 MED ORDER — TECHNETIUM TO 99M ALBUMIN AGGREGATED
4.0000 | Freq: Once | INTRAVENOUS | Status: AC | PRN
  Administered 2020-05-02: 4 via INTRAVENOUS

## 2020-05-02 MED ORDER — SODIUM CHLORIDE 0.9 % IV BOLUS
1000.0000 mL | Freq: Once | INTRAVENOUS | Status: AC
  Administered 2020-05-02: 1000 mL via INTRAVENOUS

## 2020-05-02 MED ORDER — IBUPROFEN 800 MG PO TABS
800.0000 mg | ORAL_TABLET | Freq: Once | ORAL | Status: AC
  Administered 2020-05-02: 800 mg via ORAL
  Filled 2020-05-02: qty 1

## 2020-05-02 NOTE — ED Provider Notes (Signed)
[facility] EMERGENCY DEPARTMENT Provider Note   CSN: [id number] Arrival date & time: 05/02/20  1144     History Chief Complaint  Patient presents with  . Covid Positive    Zachary Nash is a 50 y.o. male who presents with cc of chest pain.  Patient has a past medical history of degenerative disc disease and previous spinal operations.  He also has a history of sleep apnea.  Patient has had symptoms of coronavirus symptoms Wednesday, 26 January.  He tested positive for coronavirus on 04/30/2019.  Patient was sent in by his primary care doctor today due to pain in his chest which he describes as pressure like something heavy sitting on his chest.  He has pain which is worse with deep breathing and coughing.  He denies any sharp pain.  Pain does not radiate.  He denies hemoptysis, unilateral leg swelling or history of pulmonary embolus.  Pain is nonexertional.  He denies any worsening shortness of breath.  Patient has been previously infected with coronavirus twice.  HPI     Past Medical History:  Diagnosis Date  . Anxiety   . Arthritis    lower back, hands  . Asthma   . Bilateral inguinal hernia 05/29/2016   surgery to repair  . Constipation   . DDD (degenerative disc disease), lumbar   . Fatty liver   . GERD (gastroesophageal reflux disease)   . History of hiatal hernia   . History of kidney stones 2006   passed stone, no surgery  . Pneumonia   . PONV (postoperative nausea and vomiting)    pt also states his O2 drops after surgery  . Radicular pain of left lower extremity   . Sleep apnea    patient uses 2 L oxygen night - no cpap, 07/06/19 on CPAP    Patient Active Problem List   Diagnosis Date Noted  . 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 Excessive daytime sleepiness 01/05/2019  . PLMD (periodic limb movement disorder) 01/05/2019  . Chronic insomnia 01/05/2019  . Upper airway cough syndrome 03/31/2018  . Cough variant asthma 03/28/2018  . DOE (dyspnea on exertion)  03/09/2018  . Nocturnal hypoxemia 03/09/2018  . Spinal cord compression ([place])   . Surgery, elective   . Cervical myelopathy ([place])   . Syncope, vasovagal   . Weakness   . Generalized weakness 05/25/2017  . Left sided numbness 05/24/2017  . Right inguinal pain 04/20/2017  . Dysphagia 03/24/2017  . Bilateral inguinal hernia 05/29/2016  . 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 Hyperglycemia 05/18/2013  . Radicular pain of left lower extremity   . Dizziness 05/16/2013    Past Surgical History:  Procedure Laterality Date  . ANTERIOR CERVICAL DECOMP/DISCECTOMY FUSION N/A 05/26/2017   Procedure: ANTERIOR CERVICAL DECOMPRESSION/DISCECTOMY FUSION TWO LEVELS Cervical five-six, Cervical six-seven;  Surgeon: Phylliss Bob, MD;  Location: [place];  Service: Orthopedics;  Laterality: N/A;  . BIOPSY  04/11/2018   Procedure: BIOPSY;  Surgeon: Otis Brace, MD;  Location: [facility];  Service: Gastroenterology;;  . CHOLECYSTECTOMY    . ESOPHAGEAL DILATION N/A 03/24/2017   Procedure: ESOPHAGEAL DILATION;  Surgeon: Rogene Houston, MD;  Location: [facility];  Service: Endoscopy;  Laterality: N/A;  . ESOPHAGEAL MANOMETRY N/A 04/11/2018   Procedure: ESOPHAGEAL MANOMETRY (EM);  Surgeon: Otis Brace, MD;  Location: [facility];  Service: Gastroenterology;  Laterality: N/A;  . ESOPHAGOGASTRODUODENOSCOPY N/A 03/24/2017   Procedure: ESOPHAGOGASTRODUODENOSCOPY (EGD);  Surgeon: Rogene Houston, MD;  [facility];  Service:  Endoscopy;  Laterality: N/A;  1155  . ESOPHAGOGASTRODUODENOSCOPY (EGD) WITH PROPOFOL N/A 04/11/2018   Procedure: ESOPHAGOGASTRODUODENOSCOPY (EGD) WITH PROPOFOL;  Surgeon: Otis Brace, MD;  Location: [facility];  Service: Gastroenterology;  Laterality: N/A;  mano probe to be placed during EGD  . INGUINAL HERNIA REPAIR Bilateral 05/29/2016   Procedure: OPEN HERNIA REPAIR INGUINAL ADULT BILATERAL WITH insertion of MESH;  Surgeon: Fanny Skates, MD;  Location: [place];  Service: General;  Laterality: Bilateral;  . Lipoma removed    . RADIOLOGY WITH ANESTHESIA N/A 06/14/2018   Procedure: MRI LUMBER SPINE WITHOUT CONTRAST;  Surgeon: Radiologist, Medication, MD;  Location: [place];  Service: Radiology;  Laterality: N/A;  . WISDOM TOOTH EXTRACTION         Family History  Problem Relation Age of Onset  . Heart failure Father   . Heart attack Father   . Ovarian cancer Mother   . Arthritis Mother   . Anxiety disorder Mother     Social History   Tobacco Use  . Smoking status: Never Smoker  . Smokeless tobacco: Never Used  Vaping Use  . Vaping Use: Never used  Substance Use Topics  . Alcohol use: No  . Drug use: No    Home Medications Prior to Admission medications   Medication Sig Start Date End Date Taking? Authorizing Provider  albuterol (PROVENTIL HFA;VENTOLIN HFA) 108 (90 BASE) MCG/ACT inhaler Inhale 2 puffs into the lungs every 6 (six) hours as needed for wheezing or shortness of breath.     [provider]  azithromycin (ZITHROMAX) 250 MG tablet Take 500 mg today and 250 mg on days 2-5 05/01/20   Dorena Dew, FNP  benzonatate (TESSALON) 100 MG capsule Take 1 tablet by mouth every 4-8 hours as needed for cough 04/28/20   Avegno, Darrelyn Hillock, FNP  cetirizine (ZYRTEC ALLERGY) 10 MG tablet Take 1 tablet (10 mg total) by mouth daily. 04/28/20   Avegno, Darrelyn Hillock, FNP  dexamethasone (DECADRON) 4 MG tablet Take 1 tablet (4 mg total) by mouth daily for 7 days. 04/28/20 05/05/20  Avegno, Darrelyn Hillock, FNP  diazepam (VALIUM) 5 MG tablet Take 1 tablet (5 mg total) by mouth every 8 (eight) hours as needed for muscle spasms. 03/02/19   Petrucelli, Samantha R, PA-C  doxycycline (VIBRAMYCIN) 100 MG capsule Take 1 capsule (100 mg total) by mouth 2 (two) times daily. 06/30/19   Wurst, [place], PA-C  DULoxetine (CYMBALTA) 20 MG capsule Take 20 mg by mouth at bedtime.  02/03/18   [provider]  fluticasone (FLONASE) 50 MCG/ACT nasal spray Place 1  spray into both nostrils daily for 14 days. 07/27/19 08/10/19  Avegno, Darrelyn Hillock, FNP  gabapentin (NEURONTIN) 300 MG capsule TAKE ONE CAPSULE (300MG TOTAL) BY MOUTH THREE TIMES DAILY 05/02/19   Tanda Rockers, MD  guaifenesin (ROBITUSSIN) 100 MG/5ML syrup Take 5-10 mLs (100-200 mg total) by mouth every 4 (four) hours as needed for cough. 06/30/19   Wurst, [place], PA-C  Hydrogen Peroxide 1.5 % GEL Use as directed 1 each in the mouth or throat every 6 (six) hours as needed. 05/01/20   Dorena Dew, FNP  lidocaine (XYLOCAINE) 2 % solution Use as directed 15 mLs in the mouth or throat as needed for mouth pain. 02/16/20   Avegno, Darrelyn Hillock, FNP  menthol-cetylpyridinium (CEPACOL) 3 MG lozenge Take 1 lozenge (3 mg total) by mouth as needed for sore throat. 07/27/19   Avegno, Darrelyn Hillock, FNP  naproxen sodium (ALEVE) 220 MG  tablet Take 440 mg by mouth daily as needed (for pain or headache).     [provider]  ondansetron (ZOFRAN) 4 MG tablet Take 1 tablet (4 mg total) by mouth every 8 (eight) hours as needed for nausea or vomiting. 05/01/20   Dorena Dew, FNP  OXYGEN Inhale 2 L into the lungs at bedtime.     [provider]  pantoprazole (PROTONIX) 40 MG tablet Take 1 tablet (40 mg total) by mouth 2 (two) times daily before a meal. 05/03/19   Laurine Blazer B, PA-C  triamcinolone (ORALONE) 0.1 % paste Use as directed 1 application in the mouth or throat 2 (two) times daily. 04/28/20   Avegno, Darrelyn Hillock, FNP    Allergies    Dilaudid [hydromorphone hcl], Lidocaine, and Penicillins  Review of Systems   Review of Systems Ten systems reviewed and are negative for acute change, except as noted in the HPI.   Physical Exam Updated Vital Signs BP 125/90   Pulse 85   Temp 97.8 F (36.6 C) (Oral)   Resp 18   Ht 5' 10"  (1.778 m)   Wt 97.5 kg   SpO2 100%   BMI 30.85 kg/m   Physical Exam Vitals and nursing note reviewed.  Constitutional:      General: He is not in acute  distress.    Appearance: He is well-developed and well-nourished. He is not diaphoretic.  HENT:     Head: Normocephalic and atraumatic.  Eyes:     General: No scleral icterus.    Conjunctiva/sclera: Conjunctivae normal.  Cardiovascular:     Rate and Rhythm: Regular rhythm. Tachycardia present.     Heart sounds: Normal heart sounds.  Pulmonary:     Effort: Pulmonary effort is normal. No respiratory distress.     Breath sounds: Normal breath sounds.  Abdominal:     Palpations: Abdomen is soft.     Tenderness: There is no abdominal tenderness.  Musculoskeletal:        General: No edema.     Cervical back: Normal range of motion and neck supple.  Skin:    General: Skin is warm and dry.  Neurological:     Mental Status: He is alert.  Psychiatric:        Behavior: Behavior normal.     ED Results / Procedures / Treatments   Labs (all labs ordered are listed, but only abnormal results are displayed) Labs Reviewed  BASIC METABOLIC PANEL - Abnormal; Notable for the following components:      Result Value   Glucose, Bld 118 (*)    BUN 21 (*)    Creatinine, Ser 1.30 (*)    All other components within normal limits  CBC WITH DIFFERENTIAL/PLATELET    EKG EKG Interpretation  Date/Time:  Thursday May 02 2020 11:47:18 EST Ventricular Rate:  106 PR Interval:  140 QRS Duration: 84 QT Interval:  368 QTC Calculation: 488 R Axis:   27 Text Interpretation: Sinus tachycardia ST & T wave abnormality, consider inferolateral ischemia Abnormal ECG Confirmed by Aletta Edouard [phone]) on 05/02/2020 12:13:29 PM   Radiology NM PULMONARY VENT AND PERF (V/Q Scan)  Result Date: 05/02/2020 CLINICAL DATA:  Shortness of breath since Sunday. EXAM: NUCLEAR MEDICINE PERFUSION LUNG SCAN TECHNIQUE: Perfusion images were obtained in multiple projections after intravenous injection of radiopharmaceutical. Ventilation scans intentionally deferred if perfusion scan and chest x-ray adequate for  interpretation during COVID 19 epidemic. RADIOPHARMACEUTICALS:  4.0 mCi Tc-28mMAA IV COMPARISON:  Chest x-ray  05/02/2020 FINDINGS: Normal perfusion lung scan. No segmental or subsegmental perfusion defects to suggest pulmonary embolism. IMPRESSION: Negative perfusion lung scan for pulmonary embolism. Electronically Signed   By: Marijo Sanes M.D.   On: 05/02/2020 15:41   DG Chest Port 1 View  Result Date: 05/02/2020 CLINICAL DATA:  COVID positive 4 days ago now shortness of breath and worsening cough. EXAM: PORTABLE CHEST 1 VIEW COMPARISON:  Chest radiograph November 21, 2019. FINDINGS: The heart size and mediastinal contours are within normal limits. Mild diffuse interstitial thickening. No focal consolidation. No pleural effusion. The visualized skeletal structures are unchanged. IMPRESSION: Mild diffuse interstitial thickening, nonspecific, could reflect atypical pneumonia including potential viral pneumonia such as COVID 19. Electronically Signed   By: Dahlia Bailiff MD   On: 05/02/2020 12:16    Procedures Procedures   Medications Ordered in ED Medications  sodium chloride 0.9 % bolus 1,000 mL (0 mLs Intravenous Stopped 05/02/20 1528)  technetium albumin aggregated (MAA) injection solution 4 millicurie (4 millicuries Intravenous Contrast Given 05/02/20 1445)  ibuprofen (ADVIL) tablet 800 mg (800 mg Oral Given 05/02/20 1601)    ED Course  I have reviewed the triage vital signs and the nursing notes.  Pertinent labs & imaging results that were available during my care of the patient were reviewed by me and considered in my medical decision making (see chart for details).  Clinical Course as of 05/03/20 1006  Thu May 02, 2020  1328 Case discussed with Dr. Thornton Papas.  Patient states that he had a CTA in the past at [facility] and when he was given contrast it made his heart race. He did not have any other sxs. Dr. Thornton Papas states that this does not count as a true allergy, but avoid any issues he would  recocomend a VQ [AH]    Clinical Course User Index [AH] Margarita Mail, PA-C   MDM Rules/Calculators/A&P                          50 year old male here with complaint of chest pain with positive Covid viral infection.The emergent differential diagnosis of chest pain includes: Acute coronary syndrome, pericarditis, aortic dissection, pulmonary embolism, tension pneumothorax, pneumonia, and esophageal rupture. I ordered interpreted reviewed labs and imaging. Labs include BMP and CBC which showed no acute abnormalities.  EKG shows sinus tachycardia at a rate of 106.  Portable 1 view chest x-ray which I personally reviewed shows likely I suspect the symptoms are related to his viral pneumonia.  He is otherwise hemodynamically stable with normalizing heart rate after fluid administration and reassurance.  Patient discharged to follow-up with his primary care physician.  No suspicion for ACS is the underlying cause.  Is appropriate for discharge at this time with strict return precautions Final Clinical Impression(s) / ED Diagnoses Final diagnoses:  Pneumonia due to COVID-19 virus    Rx / DC Orders ED Discharge Orders    None       Margarita Mail, PA-C 05/03/20 1007    Hayden Rasmussen, MD 05/04/20 1144

## 2020-05-02 NOTE — Discharge Instructions (Addendum)
Your VQ scan is negative for a pulmonary embolus. Please continue to isolate for a full 10 days from onset of your sxs. Because this is a viral pneumonia you do not need antibiotics for treatment.  Please use any over-the-counter cold medications which can help with symptoms including DayQuil or NyQuil you may also use anti-inflammatory medication such as Motrin or Aleve.  It is helpful to buy a pulse oximeter which is available on [employer] or at [facility] or [facility] to monitor your home oxygen saturation.  If you are persistently below 90% you should return to the emergency department.  You should also return if you are having significant shortness of breath.

## 2020-05-02 NOTE — ED Triage Notes (Signed)
Referred by PCP for evaluation and chest xray

## 2020-05-23 DIAGNOSIS — G4733 Obstructive sleep apnea (adult) (pediatric): Secondary | ICD-10-CM | POA: Diagnosis not present

## 2020-06-20 DIAGNOSIS — G4733 Obstructive sleep apnea (adult) (pediatric): Secondary | ICD-10-CM | POA: Diagnosis not present

## 2020-06-27 DIAGNOSIS — Z01812 Encounter for preprocedural laboratory examination: Secondary | ICD-10-CM | POA: Diagnosis not present

## 2020-07-01 DIAGNOSIS — K2100 Gastro-esophageal reflux disease with esophagitis, without bleeding: Secondary | ICD-10-CM | POA: Diagnosis not present

## 2020-07-01 DIAGNOSIS — K2930 Chronic superficial gastritis without bleeding: Secondary | ICD-10-CM | POA: Diagnosis not present

## 2020-07-01 DIAGNOSIS — K449 Diaphragmatic hernia without obstruction or gangrene: Secondary | ICD-10-CM | POA: Diagnosis not present

## 2020-07-01 DIAGNOSIS — R1310 Dysphagia, unspecified: Secondary | ICD-10-CM | POA: Diagnosis not present

## 2020-07-01 DIAGNOSIS — K222 Esophageal obstruction: Secondary | ICD-10-CM | POA: Diagnosis not present

## 2020-07-04 DIAGNOSIS — K2930 Chronic superficial gastritis without bleeding: Secondary | ICD-10-CM | POA: Diagnosis not present

## 2020-07-09 ENCOUNTER — Telehealth: Payer: Medicare HMO | Admitting: Adult Health

## 2020-07-29 ENCOUNTER — Telehealth: Payer: Medicare HMO | Admitting: Adult Health

## 2020-07-30 ENCOUNTER — Telehealth: Payer: Medicare HMO | Admitting: Adult Health

## 2020-08-01 DIAGNOSIS — G4733 Obstructive sleep apnea (adult) (pediatric): Secondary | ICD-10-CM | POA: Diagnosis not present

## 2020-08-20 DIAGNOSIS — G4733 Obstructive sleep apnea (adult) (pediatric): Secondary | ICD-10-CM | POA: Diagnosis not present

## 2020-08-26 ENCOUNTER — Telehealth: Payer: Medicare HMO | Admitting: Nurse Practitioner

## 2020-08-26 DIAGNOSIS — J40 Bronchitis, not specified as acute or chronic: Secondary | ICD-10-CM | POA: Diagnosis not present

## 2020-08-26 DIAGNOSIS — K5904 Chronic idiopathic constipation: Secondary | ICD-10-CM | POA: Insufficient documentation

## 2020-08-26 DIAGNOSIS — K224 Dyskinesia of esophagus: Secondary | ICD-10-CM | POA: Insufficient documentation

## 2020-08-26 DIAGNOSIS — K2100 Gastro-esophageal reflux disease with esophagitis, without bleeding: Secondary | ICD-10-CM | POA: Insufficient documentation

## 2020-08-26 DIAGNOSIS — K2930 Chronic superficial gastritis without bleeding: Secondary | ICD-10-CM | POA: Insufficient documentation

## 2020-08-26 MED ORDER — ALBUTEROL SULFATE HFA 108 (90 BASE) MCG/ACT IN AERS: 2.0000 | INHALATION_SPRAY | Freq: Four times a day (QID) | RESPIRATORY_TRACT | 0 refills | Status: DC | PRN

## 2020-08-26 MED ORDER — BENZONATATE 100 MG PO CAPS: 100.0000 mg | ORAL_CAPSULE | Freq: Three times a day (TID) | ORAL | 0 refills | Status: DC | PRN

## 2020-08-26 MED ORDER — AZITHROMYCIN 250 MG PO TABS: ORAL_TABLET | ORAL | 0 refills | Status: AC

## 2020-08-26 NOTE — Progress Notes (Signed)
We are sorry that you are not feeling well.  Here is how we plan to help!  Based on your presentation I believe you most likely have A cough due to bacteria.  When patients have a fever and a productive cough with a change in color or increased sputum production, we are concerned about bacterial bronchitis.  If left untreated it can progress to pneumonia.  If your symptoms do not improve with your treatment plan it is important that you contact your provider.   I have prescribed Azithromyin 250 mg: two tablets now and then one tablet daily for 4 additonal days    In addition you may use A prescription cough medication called Tessalon Perles 11m. You may take 1-2 capsules every 8 hours as needed for your cough.  We will also send a refill for your inhaler.  If you have a nebulizer machine at home and prefer the nebulizer solution please let [place] and we can send that in instead.   We realized that breathing treatments have helped you most in the past, if the first line treatment we send in is not helping enough, we would highly advise an urgent care visit for a breathing treatment.   It may also be a good idea to test for COVID-19 if you have not, if the cough was started by this virus you may be a candidate for other therapies specific for that virus.    From your responses in the eVisit questionnaire you describe inflammation in the upper respiratory tract which is causing a significant cough.  This is commonly called Bronchitis and has four common causes:    Allergies  Viral Infections  Acid Reflux  Bacterial Infection Allergies, viruses and acid reflux are treated by controlling symptoms or eliminating the cause. An example might be a cough caused by taking certain blood pressure medications. You stop the cough by changing the medication. Another example might be a cough caused by acid reflux. Controlling the reflux helps control the cough.  USE OF BRONCHODILATOR ("RESCUE")  INHALERS: There is a risk from using your bronchodilator too frequently.  The risk is that over-reliance on a medication which only relaxes the muscles surrounding the breathing tubes can reduce the effectiveness of medications prescribed to reduce swelling and congestion of the tubes themselves.  Although you feel brief relief from the bronchodilator inhaler, your asthma may actually be worsening with the tubes becoming more swollen and filled with mucus.  This can delay other crucial treatments, such as oral steroid medications. If you need to use a bronchodilator inhaler daily, several times per day, you should discuss this with your provider.  There are probably better treatments that could be used to keep your asthma under control.     HOME CARE . Only take medications as instructed by your medical team. . Complete the entire course of an antibiotic. . Drink plenty of fluids and get plenty of rest. . Avoid close contacts especially the very young and the elderly . Cover your mouth if you cough or cough into your sleeve. . Always remember to wash your hands . A steam or ultrasonic humidifier can help congestion.   GET HELP RIGHT AWAY IF: . You develop worsening fever. . You become short of breath . You cough up blood. . Your symptoms persist after you have completed your treatment plan MAKE SURE YOU   Understand these instructions.  Will watch your condition.  Will get help right away if you are not  doing well or get worse.  Your e-visit answers were reviewed by a board certified advanced clinical practitioner to complete your personal care plan.  Depending on the condition, your plan could have included both over the counter or prescription medications. If there is a problem please reply  once you have received a response from your provider. Your safety is important to [place].  If you have drug allergies check your prescription carefully.    You can use MyChart to ask questions about  today's visit, request a non-urgent call back, or ask for a work or school excuse for 24 hours related to this e-Visit. If it has been greater than 24 hours you will need to follow up with your provider, or enter a new e-Visit to address those concerns. You will get an e-mail in the next two days asking about your experience.  I hope that your e-visit has been valuable and will speed your recovery. Thank you for using e-visits.  I spent approximately 10 minutes reviewing this patient's chart, history and coordinating their plan of care.   Meds ordered this encounter  Medications  . azithromycin (ZITHROMAX) 250 MG tablet    Sig: Take 2 tablets on day 1, then 1 tablet daily on days 2 through 5    Dispense:  6 tablet    Refill:  0  . albuterol (VENTOLIN HFA) 108 (90 Base) MCG/ACT inhaler    Sig: Inhale 2 puffs into the lungs every 6 (six) hours as needed for wheezing or shortness of breath.    Dispense:  8 g    Refill:  0  . benzonatate (TESSALON) 100 MG capsule    Sig: Take 1 capsule (100 mg total) by mouth 3 (three) times daily as needed for cough.    Dispense:  30 capsule    Refill:  0

## 2020-09-11 ENCOUNTER — Emergency Department (HOSPITAL_COMMUNITY)
Admission: EM | Admit: 2020-09-11 | Discharge: 2020-09-11 | Disposition: A | Discharge status: Home / Self Care | Payer: Medicare HMO | Attending: Emergency Medicine | Admitting: Emergency Medicine

## 2020-09-11 ENCOUNTER — Other Ambulatory Visit: Payer: Self-pay

## 2020-09-11 ENCOUNTER — Encounter (HOSPITAL_COMMUNITY): Payer: Self-pay

## 2020-09-11 DIAGNOSIS — M545 Low back pain, unspecified: Secondary | ICD-10-CM | POA: Diagnosis not present

## 2020-09-11 DIAGNOSIS — Z5321 Procedure and treatment not carried out due to patient leaving prior to being seen by health care provider: Secondary | ICD-10-CM | POA: Diagnosis not present

## 2020-09-11 NOTE — ED Triage Notes (Signed)
Pt to er, pt c/o lower back pain, states that it feels like a kidney stone, states that he has a hx of kidney stones, pt states that the pain started last night.

## 2020-09-20 DIAGNOSIS — G4733 Obstructive sleep apnea (adult) (pediatric): Secondary | ICD-10-CM | POA: Diagnosis not present

## 2020-10-09 DIAGNOSIS — M79605 Pain in left leg: Secondary | ICD-10-CM | POA: Diagnosis not present

## 2020-10-20 DIAGNOSIS — G4733 Obstructive sleep apnea (adult) (pediatric): Secondary | ICD-10-CM | POA: Diagnosis not present

## 2020-10-30 DIAGNOSIS — G4733 Obstructive sleep apnea (adult) (pediatric): Secondary | ICD-10-CM | POA: Diagnosis not present

## 2020-11-09 DIAGNOSIS — Z0100 Encounter for examination of eyes and vision without abnormal findings: Secondary | ICD-10-CM | POA: Diagnosis not present

## 2020-11-09 DIAGNOSIS — H43393 Other vitreous opacities, bilateral: Secondary | ICD-10-CM | POA: Diagnosis not present

## 2020-11-20 DIAGNOSIS — G4733 Obstructive sleep apnea (adult) (pediatric): Secondary | ICD-10-CM | POA: Diagnosis not present

## 2020-11-21 ENCOUNTER — Telehealth: Payer: Self-pay | Admitting: Adult Health

## 2020-11-21 DIAGNOSIS — G4733 Obstructive sleep apnea (adult) (pediatric): Secondary | ICD-10-CM

## 2020-11-21 NOTE — Telephone Encounter (Signed)
Spoke with patient.  He states he spoke with [facility] and they told him that as long as he had a prescription he would be able to get the travel machine but will need to pay his deductible.  I also encouraged the patient to schedule an appointment for a yearly follow-up as it has been was 2 years since we have seen him and we will need updated information to be able to send orders to [facility] for supplies.  Patient scheduled a 15 min VV with Megan NP on 12/03/20 at 245 pm.  Patient aware if insurance kicks back we can use the appointment for updated clinical information to support our request.   Order sent to [facility] requesting travel CPAP machine.

## 2020-11-21 NOTE — Telephone Encounter (Signed)
Pt called wanting to discuss a travel cpap machine with RN. Please advise.

## 2020-11-22 ENCOUNTER — Encounter: Payer: Self-pay | Admitting: Emergency Medicine

## 2020-11-22 ENCOUNTER — Ambulatory Visit
Admission: EM | Admit: 2020-11-22 | Discharge: 2020-11-22 | Disposition: A | Discharge status: Home / Self Care | Payer: Medicare HMO | Attending: Emergency Medicine | Admitting: Emergency Medicine

## 2020-11-22 ENCOUNTER — Other Ambulatory Visit: Payer: Self-pay

## 2020-11-22 DIAGNOSIS — R059 Cough, unspecified: Secondary | ICD-10-CM

## 2020-11-22 DIAGNOSIS — H66003 Acute suppurative otitis media without spontaneous rupture of ear drum, bilateral: Secondary | ICD-10-CM

## 2020-11-22 DIAGNOSIS — Z20822 Contact with and (suspected) exposure to covid-19: Secondary | ICD-10-CM | POA: Diagnosis not present

## 2020-11-22 MED ORDER — AZITHROMYCIN 250 MG PO TABS: 250.0000 mg | ORAL_TABLET | Freq: Every day | ORAL | 0 refills | Status: DC

## 2020-11-22 MED ORDER — BENZONATATE 100 MG PO CAPS: 100.0000 mg | ORAL_CAPSULE | Freq: Three times a day (TID) | ORAL | 0 refills | Status: DC

## 2020-11-22 MED ORDER — ACETAMINOPHEN 325 MG PO TABS
650.0000 mg | ORAL_TABLET | Freq: Once | ORAL | Status: AC
  Administered 2020-11-22: 650 mg via ORAL

## 2020-11-22 NOTE — Discharge Instructions (Signed)
COVID testing ordered.  It will take between 5-7 days for test results.  Someone will contact you regarding abnormal results.    In the meantime: You should remain isolated in your home for 5 days from symptom onset AND greater than 72 hours after symptoms resolution (absence of fever without the use of fever-reducing medication and improvement in respiratory symptoms), whichever is longer Get plenty of rest and push fluids Azithromycin for ear infection Tessalon Perles prescribed for cough Use OTC zyrtec for nasal congestion, runny nose, and/or sore throat Use OTC flonase for nasal congestion and runny nose Use medications daily for symptom relief Use OTC medications like ibuprofen or tylenol as needed fever or pain Call or go to the ED if you have any new or worsening symptoms such as fever, worsening cough, shortness of breath, chest tightness, chest pain, turning blue, changes in mental status, etc..Marland Kitchen

## 2020-11-22 NOTE — ED Triage Notes (Signed)
States he has been dizzy this morning and disoriented.  States he feels fatigued.  Dry cough.  Exposure to covid 2 weeks ago. Bilateral ear pain and headache.

## 2020-11-22 NOTE — ED Provider Notes (Signed)
[place]   [id number] 11/22/20 Arrival Time: 1645   CC: COVID symptoms  SUBJECTIVE: History from: patient.  Zachary Nash is a 50 y.o. male who presents with lightheadedness, headache, ear pain, and cough x 1-2 days.  Covid exposure 2 weeks ago.  Denies alleviating or aggravating factors.  Reports previous symptoms in the past with covid.   Denies fever, SOB, wheezing, chest pain, nausea, changes in bowel or bladder habits.    ROS: As per HPI.  All other pertinent ROS negative.     Past Medical History:  Diagnosis Date   Anxiety    Arthritis    lower back, hands   Asthma    Bilateral inguinal hernia 05/29/2016   surgery to repair   Constipation    DDD (degenerative disc disease), lumbar    Fatty liver    GERD (gastroesophageal reflux disease)    History of hiatal hernia    History of kidney stones 2006   passed stone, no surgery   Pneumonia    PONV (postoperative nausea and vomiting)    pt also states his O2 drops after surgery   Radicular pain of left lower extremity    Sleep apnea    patient uses 2 L oxygen night - no cpap, 07/06/19 on CPAP   Past Surgical History:  Procedure Laterality Date   ANTERIOR CERVICAL DECOMP/DISCECTOMY FUSION N/A 05/26/2017   Procedure: ANTERIOR CERVICAL DECOMPRESSION/DISCECTOMY FUSION TWO LEVELS Cervical five-six, Cervical six-seven;  Surgeon: Phylliss Bob, MD;  Location: [place];  Service: Orthopedics;  Laterality: N/A;   BIOPSY  04/11/2018   Procedure: BIOPSY;  Surgeon: Otis Brace, MD;  [facility];  Service: Gastroenterology;;   CHOLECYSTECTOMY     ESOPHAGEAL DILATION N/A 03/24/2017   Procedure: ESOPHAGEAL DILATION;  Surgeon: Rogene Houston, MD;  [facility];  Service: Endoscopy;  Laterality: N/A;   ESOPHAGEAL MANOMETRY N/A 04/11/2018   Procedure: ESOPHAGEAL MANOMETRY (EM);  Surgeon: Otis Brace, MD;  [facility];  Service: Gastroenterology;  Laterality: N/A;    ESOPHAGOGASTRODUODENOSCOPY N/A 03/24/2017   Procedure: ESOPHAGOGASTRODUODENOSCOPY (EGD);  Surgeon: Rogene Houston, MD;  [facility];  Service: Endoscopy;  Laterality: N/A;  1155   ESOPHAGOGASTRODUODENOSCOPY (EGD) WITH PROPOFOL N/A 04/11/2018   Procedure: ESOPHAGOGASTRODUODENOSCOPY (EGD) WITH PROPOFOL;  Surgeon: Otis Brace, MD;  [facility];  Service: Gastroenterology;  Laterality: N/A;  mano probe to be placed during EGD   INGUINAL HERNIA REPAIR Bilateral 05/29/2016   Procedure: OPEN HERNIA REPAIR INGUINAL ADULT BILATERAL WITH insertion of MESH;  Surgeon: Fanny Skates, MD;  Location: [place];  Service: General;  Laterality: Bilateral;   Lipoma removed     RADIOLOGY WITH ANESTHESIA N/A 06/14/2018   Procedure: MRI LUMBER SPINE WITHOUT CONTRAST;  Surgeon: Radiologist, Medication, MD;  Location: [place];  Service: Radiology;  Laterality: N/A;   WISDOM TOOTH EXTRACTION     Allergies  Allergen Reactions   Dilaudid [Hydromorphone Hcl] Anaphylaxis   Lidocaine Other (See Comments)    SYNCOPE "passed out"   Penicillins Anaphylaxis, Swelling and Other (See Comments)    Has patient had a PCN reaction causing immediate rash, facial/tongue/throat swelling, SOB or lightheadedness with hypotension: yes Has patient had a PCN reaction causing severe rash involving mucus membranes or skin necrosis: yes Has patient had a PCN reaction that required hospitalization: no Has patient had a PCN reaction occurring within the last 10 years: no If all of the above answers are "NO", then may proceed with Cephalosporin use.  Contrast Media [Iodinated Diagnostic Agents] Palpitations   No current facility-administered medications on file prior to encounter.   Current Outpatient Medications on File Prior to Encounter  Medication Sig Dispense Refill   albuterol (VENTOLIN HFA) 108 (90 Base) MCG/ACT inhaler Inhale 2 puffs into the lungs every 6 (six) hours as needed for wheezing or shortness of  breath. 8 g 0   cetirizine (ZYRTEC ALLERGY) 10 MG tablet Take 1 tablet (10 mg total) by mouth daily. 30 tablet 0   diazepam (VALIUM) 5 MG tablet Take 1 tablet (5 mg total) by mouth every 8 (eight) hours as needed for muscle spasms. 15 tablet 0   DULoxetine (CYMBALTA) 20 MG capsule Take 20 mg by mouth at bedtime.   2   fluticasone (FLONASE) 50 MCG/ACT nasal spray Place 1 spray into both nostrils daily for 14 days. 16 g 0   gabapentin (NEURONTIN) 300 MG capsule TAKE ONE CAPSULE (300MG TOTAL) BY MOUTH THREE TIMES DAILY 90 capsule 2   guaifenesin (ROBITUSSIN) 100 MG/5ML syrup Take 5-10 mLs (100-200 mg total) by mouth every 4 (four) hours as needed for cough. 60 mL 0   Hydrogen Peroxide 1.5 % GEL Use as directed 1 each in the mouth or throat every 6 (six) hours as needed. 15 g 0   lidocaine (XYLOCAINE) 2 % solution Use as directed 15 mLs in the mouth or throat as needed for mouth pain. 100 mL 0   menthol-cetylpyridinium (CEPACOL) 3 MG lozenge Take 1 lozenge (3 mg total) by mouth as needed for sore throat. 100 tablet 12   naproxen sodium (ALEVE) 220 MG tablet Take 440 mg by mouth daily as needed (for pain or headache).      ondansetron (ZOFRAN) 4 MG tablet Take 1 tablet (4 mg total) by mouth every 8 (eight) hours as needed for nausea or vomiting. 30 tablet 0   OXYGEN Inhale 2 L into the lungs at bedtime.      pantoprazole (PROTONIX) 40 MG tablet Take 1 tablet (40 mg total) by mouth 2 (two) times daily before a meal. 180 tablet 0   triamcinolone (ORALONE) 0.1 % paste Use as directed 1 application in the mouth or throat 2 (two) times daily. 5 g 12   Social History   Socioeconomic History   Marital status: Married    Spouse name: Not on file   Number of children: Not on file   Years of education: Not on file   Highest education level: Not on file  Occupational History   Not on file  Tobacco Use   Smoking status: Never   Smokeless tobacco: Never  Vaping Use   Vaping Use: Never used  Substance  and Sexual Activity   Alcohol use: No   Drug use: No   Sexual activity: Not on file  Other Topics Concern   Not on file  Social History Narrative   Not on file   Social Determinants of Health   Financial Resource Strain: Not on file  Food Insecurity: Not on file  Transportation Needs: Not on file  Physical Activity: Not on file  Stress: Not on file  Social Connections: Not on file  Intimate Partner Violence: Not on file   Family History  Problem Relation Age of Onset   Heart failure Father    Heart attack Father    Ovarian cancer Mother    Arthritis Mother    Anxiety disorder Mother     OBJECTIVE:  Vitals:   11/22/20 1657  BP: Marland Kitchen)  142/89  Pulse: 100  Resp: 16  Temp: 98.7 F (37.1 C)  TempSrc: Oral  SpO2: 97%    General appearance: alert; appears mildly fatigued, but nontoxic; speaking in full sentences and tolerating own secretions HEENT: NCAT; Ears: EACs clear, TMs erythematous; Eyes: PERRL.  EOM grossly intact. Nose: nares patent without rhinorrhea, Throat: oropharynx clear, tonsils non erythematous or enlarged, uvula midline  Neck: supple without LAD Lungs: unlabored respirations, symmetrical air entry; cough: mild; no respiratory distress; CTAB Heart: regular rate and rhythm.   Skin: warm and dry Psychological: alert and cooperative; normal mood and affect   ASSESSMENT & PLAN:  1. Exposure to COVID-19 virus   2. Cough   3. Non-recurrent acute suppurative otitis media of both ears without spontaneous rupture of tympanic membranes     Meds ordered this encounter  Medications   azithromycin (ZITHROMAX) 250 MG tablet    Sig: Take 1 tablet (250 mg total) by mouth daily. Take first 2 tablets together, then 1 every day until finished.    Dispense:  6 tablet    Refill:  0    Order Specific Question:   Supervising Provider    Answer:   Raylene Everts [[id number]]   benzonatate (TESSALON) 100 MG capsule    Sig: Take 1 capsule (100 mg total) by mouth every 8  (eight) hours.    Dispense:  63 capsule    Refill:  0    Order Specific Question:   Supervising Provider    Answer:   Raylene Everts [[id number]]     COVID testing ordered.  It will take between 5-7 days for test results.  Someone will contact you regarding abnormal results.    In the meantime: You should remain isolated in your home for 5 days from symptom onset AND greater than 72 hours after symptoms resolution (absence of fever without the use of fever-reducing medication and improvement in respiratory symptoms), whichever is longer Get plenty of rest and push fluids Azithromycin for ear infection Tessalon Perles prescribed for cough Use OTC zyrtec for nasal congestion, runny nose, and/or sore throat Use OTC flonase for nasal congestion and runny nose Use medications daily for symptom relief Use OTC medications like ibuprofen or tylenol as needed fever or pain Call or go to the ED if you have any new or worsening symptoms such as fever, worsening cough, shortness of breath, chest tightness, chest pain, turning blue, changes in mental status, etc...   Reviewed expectations re: course of current medical issues. Questions answered. Outlined signs and symptoms indicating need for more acute intervention. Patient verbalized understanding. After Visit Summary given.          Lestine Box, PA-C 11/22/20 1709

## 2020-11-23 LAB — SARS-COV-2, NAA 2 DAY TAT

## 2020-11-23 LAB — NOVEL CORONAVIRUS, NAA: SARS-CoV-2, NAA: NOT DETECTED

## 2020-11-26 DIAGNOSIS — G629 Polyneuropathy, unspecified: Secondary | ICD-10-CM | POA: Diagnosis not present

## 2020-11-26 DIAGNOSIS — Z880 Allergy status to penicillin: Secondary | ICD-10-CM | POA: Diagnosis not present

## 2020-11-26 DIAGNOSIS — M549 Dorsalgia, unspecified: Secondary | ICD-10-CM | POA: Diagnosis not present

## 2020-11-26 DIAGNOSIS — M545 Low back pain, unspecified: Secondary | ICD-10-CM | POA: Diagnosis not present

## 2020-11-26 DIAGNOSIS — M79605 Pain in left leg: Secondary | ICD-10-CM | POA: Diagnosis not present

## 2020-11-26 DIAGNOSIS — M79604 Pain in right leg: Secondary | ICD-10-CM | POA: Diagnosis not present

## 2020-11-29 DIAGNOSIS — R252 Cramp and spasm: Secondary | ICD-10-CM | POA: Diagnosis not present

## 2020-12-03 ENCOUNTER — Telehealth (INDEPENDENT_AMBULATORY_CARE_PROVIDER_SITE_OTHER): Payer: Medicare HMO | Admitting: Adult Health

## 2020-12-03 DIAGNOSIS — G4733 Obstructive sleep apnea (adult) (pediatric): Secondary | ICD-10-CM | POA: Diagnosis not present

## 2020-12-03 DIAGNOSIS — Z9989 Dependence on other enabling machines and devices: Secondary | ICD-10-CM

## 2020-12-03 NOTE — Progress Notes (Signed)
PATIENT: Zachary Nash DOB: 01/13/1971  REASON FOR VISIT: follow up HISTORY FROM: patient PRIMARY NEUROLOGIST:   Virtual Visit via Video Note  I connected with Zachary Nash on 12/03/20 at  2:45 PM EDT by a video enabled telemedicine application located remotely at [facility] and verified that I am speaking with the correct person using two identifiers who was located at their own home.   I discussed the limitations of evaluation and management by telemedicine and the availability of in person appointments. The patient expressed understanding and agreed to proceed.   PATIENT: Zachary Nash DOB: Feb 01, 1971  REASON FOR VISIT: follow up HISTORY FROM: patient  HISTORY OF PRESENT ILLNESS: Today 12/03/20:  Mr. Laker is a 50 year old male with a history of obstructive sleep apnea on CPAP.  He reports that the CPAP is working well.  He would like to have a travel machine.  His download is below    HISTORY 01/01/20:   Mr. Litt is a 50 year old male with a history of obstructive sleep apnea on CPAP.  His download indicates that he use his machine nightly for compliance of 100%.  He uses machine greater than 4 hours 27 days for compliance of 90%.  On average he uses his machine 6 hours and 4 minutes.  His residual AHI is 2.4 on 5 to 12 cm of water with EPR 1.  Leak in the 95th percentile is 21.5 L/min.  He reports that at the end of this month he will be having surgery to fix his deviated septum and have his tonsils removed.  He states that currently he is finding it hard to use the nasal pillows due to his deviated septum.      REVIEW OF SYSTEMS: Out of a complete 14 system review of symptoms, the patient complains only of the following symptoms, and all other reviewed systems are negative.  ALLERGIES: Allergies  Allergen Reactions   Dilaudid [Hydromorphone Hcl] Anaphylaxis   Lidocaine Other (See Comments)    SYNCOPE "passed out"   Penicillins  Anaphylaxis, Swelling and Other (See Comments)    Has patient had a PCN reaction causing immediate rash, facial/tongue/throat swelling, SOB or lightheadedness with hypotension: yes Has patient had a PCN reaction causing severe rash involving mucus membranes or skin necrosis: yes Has patient had a PCN reaction that required hospitalization: no Has patient had a PCN reaction occurring within the last 10 years: no If all of the above answers are "NO", then may proceed with Cephalosporin use.   Contrast Media [Iodinated Diagnostic Agents] Palpitations    HOME MEDICATIONS: Outpatient Medications Prior to Visit  Medication Sig Dispense Refill   albuterol (VENTOLIN HFA) 108 (90 Base) MCG/ACT inhaler Inhale 2 puffs into the lungs every 6 (six) hours as needed for wheezing or shortness of breath. 8 g 0   azithromycin (ZITHROMAX) 250 MG tablet Take 1 tablet (250 mg total) by mouth daily. Take first 2 tablets together, then 1 every day until finished. 6 tablet 0   benzonatate (TESSALON) 100 MG capsule Take 1 capsule (100 mg total) by mouth every 8 (eight) hours. 63 capsule 0   cetirizine (ZYRTEC ALLERGY) 10 MG tablet Take 1 tablet (10 mg total) by mouth daily. 30 tablet 0   diazepam (VALIUM) 5 MG tablet Take 1 tablet (5 mg total) by mouth every 8 (eight) hours as needed for muscle spasms. 15 tablet 0   DULoxetine (CYMBALTA) 20 MG capsule Take 20 mg by mouth at  bedtime.   2   fluticasone (FLONASE) 50 MCG/ACT nasal spray Place 1 spray into both nostrils daily for 14 days. 16 g 0   gabapentin (NEURONTIN) 300 MG capsule TAKE ONE CAPSULE (300MG TOTAL) BY MOUTH THREE TIMES DAILY 90 capsule 2   guaifenesin (ROBITUSSIN) 100 MG/5ML syrup Take 5-10 mLs (100-200 mg total) by mouth every 4 (four) hours as needed for cough. 60 mL 0   Hydrogen Peroxide 1.5 % GEL Use as directed 1 each in the mouth or throat every 6 (six) hours as needed. 15 g 0   lidocaine (XYLOCAINE) 2 % solution Use as directed 15 mLs in the mouth or  throat as needed for mouth pain. 100 mL 0   menthol-cetylpyridinium (CEPACOL) 3 MG lozenge Take 1 lozenge (3 mg total) by mouth as needed for sore throat. 100 tablet 12   naproxen sodium (ALEVE) 220 MG tablet Take 440 mg by mouth daily as needed (for pain or headache).      ondansetron (ZOFRAN) 4 MG tablet Take 1 tablet (4 mg total) by mouth every 8 (eight) hours as needed for nausea or vomiting. 30 tablet 0   OXYGEN Inhale 2 L into the lungs at bedtime.      pantoprazole (PROTONIX) 40 MG tablet Take 1 tablet (40 mg total) by mouth 2 (two) times daily before a meal. 180 tablet 0   triamcinolone (ORALONE) 0.1 % paste Use as directed 1 application in the mouth or throat 2 (two) times daily. 5 g 12   No facility-administered medications prior to visit.    PAST MEDICAL HISTORY: Past Medical History:  Diagnosis Date   Anxiety    Arthritis    lower back, hands   Asthma    Bilateral inguinal hernia 05/29/2016   surgery to repair   Constipation    DDD (degenerative disc disease), lumbar    Fatty liver    GERD (gastroesophageal reflux disease)    History of hiatal hernia    History of kidney stones 2006   passed stone, no surgery   Pneumonia    PONV (postoperative nausea and vomiting)    pt also states his O2 drops after surgery   Radicular pain of left lower extremity    Sleep apnea    patient uses 2 L oxygen night - no cpap, 07/06/19 on CPAP    PAST SURGICAL HISTORY: Past Surgical History:  Procedure Laterality Date   ANTERIOR CERVICAL DECOMP/DISCECTOMY FUSION N/A 05/26/2017   Procedure: ANTERIOR CERVICAL DECOMPRESSION/DISCECTOMY FUSION TWO LEVELS Cervical five-six, Cervical six-seven;  Surgeon: Phylliss Bob, MD;  Location: [place];  Service: Orthopedics;  Laterality: N/A;   BIOPSY  04/11/2018   Procedure: BIOPSY;  Surgeon: Otis Brace, MD;  [facility];  Service: Gastroenterology;;   CHOLECYSTECTOMY     ESOPHAGEAL DILATION N/A 03/24/2017   Procedure: ESOPHAGEAL  DILATION;  Surgeon: Rogene Houston, MD;  [facility];  Service: Endoscopy;  Laterality: N/A;   ESOPHAGEAL MANOMETRY N/A 04/11/2018   Procedure: ESOPHAGEAL MANOMETRY (EM);  Surgeon: Otis Brace, MD;  [facility];  Service: Gastroenterology;  Laterality: N/A;   ESOPHAGOGASTRODUODENOSCOPY N/A 03/24/2017   Procedure: ESOPHAGOGASTRODUODENOSCOPY (EGD);  Surgeon: Rogene Houston, MD;  [facility];  Service: Endoscopy;  Laterality: N/A;  1155   ESOPHAGOGASTRODUODENOSCOPY (EGD) WITH PROPOFOL N/A 04/11/2018   Procedure: ESOPHAGOGASTRODUODENOSCOPY (EGD) WITH PROPOFOL;  Surgeon: Otis Brace, MD;  [facility];  Service: Gastroenterology;  Laterality: N/A;  mano probe to be placed during EGD  INGUINAL HERNIA REPAIR Bilateral 05/29/2016   Procedure: OPEN HERNIA REPAIR INGUINAL ADULT BILATERAL WITH insertion of MESH;  Surgeon: Fanny Skates, MD;  Location: [place];  Service: General;  Laterality: Bilateral;   Lipoma removed     RADIOLOGY WITH ANESTHESIA N/A 06/14/2018   Procedure: MRI LUMBER SPINE WITHOUT CONTRAST;  Surgeon: Radiologist, Medication, MD;  Location: [place];  Service: Radiology;  Laterality: N/A;   WISDOM TOOTH EXTRACTION      FAMILY HISTORY: Family History  Problem Relation Age of Onset   Heart failure Father    Heart attack Father    Ovarian cancer Mother    Arthritis Mother    Anxiety disorder Mother     SOCIAL HISTORY: Social History   Socioeconomic History   Marital status: Married    Spouse name: Not on file   Number of children: Not on file   Years of education: Not on file   Highest education level: Not on file  Occupational History   Not on file  Tobacco Use   Smoking status: Never   Smokeless tobacco: Never  Vaping Use   Vaping Use: Never used  Substance and Sexual Activity   Alcohol use: No   Drug use: No   Sexual activity: Not on file  Other Topics Concern   Not on file  Social History Narrative   Not on  file   Social Determinants of Health   Financial Resource Strain: Not on file  Food Insecurity: Not on file  Transportation Needs: Not on file  Physical Activity: Not on file  Stress: Not on file  Social Connections: Not on file  Intimate Partner Violence: Not on file      PHYSICAL EXAM Generalized: Well developed, in no acute distress   Neurological examination  Mentation: Alert oriented to time, place, history taking. Follows all commands speech and language fluent Cranial nerve II-XII:Extraocular movements were full. Facial symmetry noted. uvula tongue midline. Head turning and shoulder shrug  were normal and symmetric. Motor: Good strength throughout subjectively per patient Sensory: Sensory testing is intact to soft touch on all 4 extremities subjectively per patient Coordination: Cerebellar testing reveals good finger-nose-finger  Gait and station: Patient is able to stand from a seated position. gait is normal.  Reflexes: UTA  DIAGNOSTIC DATA (LABS, IMAGING, TESTING) - I reviewed patient records, labs, notes, testing and imaging myself where available.  Lab Results  Component Value Date   WBC 8.7 05/02/2020   HGB 15.5 05/02/2020   HCT 46.1 05/02/2020   MCV 86.0 05/02/2020   PLT 289 05/02/2020      Component Value Date/Time   NA 137 05/02/2020 1351   K 4.0 05/02/2020 1351   CL 105 05/02/2020 1351   CO2 24 05/02/2020 1351   GLUCOSE 118 (H) 05/02/2020 1351   BUN 21 (H) 05/02/2020 1351   CREATININE 1.30 (H) 05/02/2020 1351   CALCIUM 9.2 05/02/2020 1351   PROT 6.9 05/24/2017 1740   ALBUMIN 4.1 05/24/2017 1740   AST 33 05/24/2017 1740   ALT 39 05/24/2017 1740   ALKPHOS 65 05/24/2017 1740   BILITOT 0.6 05/24/2017 1740   GFRNONAA >60 05/02/2020 1351   GFRAA >60 03/22/2019 2310   No results found for: CHOL, HDL, LDLCALC, LDLDIRECT, TRIG, CHOLHDL No results found for: HGBA1C Lab Results  Component Value Date   VITAMINB12 720 07/06/2018   Lab Results   Component Value Date   TSH 1.84 03/09/2018      ASSESSMENT AND PLAN 50 y.o.  year old male  has a past medical history of Anxiety, Arthritis, Asthma, Bilateral inguinal hernia (05/29/2016), Constipation, DDD (degenerative disc disease), lumbar, Fatty liver, GERD (gastroesophageal reflux disease), History of hiatal hernia, History of kidney stones (2006), Pneumonia, PONV (postoperative nausea and vomiting), Radicular pain of left lower extremity, and Sleep apnea. here with:  OSA on CPAP  CPAP compliance excellent Residual AHI is good Encouraged patient to continue using CPAP nightly and > 4 hours each night Order sent for travel machine pressure set at 10 cm of water F/U in 1 year or sooner if needed    Ward Givens, MSN, NP-C 12/03/2020, 3:03 PM [facility] [address], [place], [place] [address] [phone]

## 2020-12-05 NOTE — Progress Notes (Signed)
CM to [employer] for travel cpap sent.

## 2020-12-05 NOTE — Progress Notes (Signed)
Denyse Amass, RN got it!

## 2020-12-13 DIAGNOSIS — R944 Abnormal results of kidney function studies: Secondary | ICD-10-CM | POA: Diagnosis not present

## 2020-12-13 DIAGNOSIS — E785 Hyperlipidemia, unspecified: Secondary | ICD-10-CM | POA: Diagnosis not present

## 2020-12-13 DIAGNOSIS — Z0000 Encounter for general adult medical examination without abnormal findings: Secondary | ICD-10-CM | POA: Diagnosis not present

## 2020-12-19 DIAGNOSIS — R944 Abnormal results of kidney function studies: Secondary | ICD-10-CM | POA: Diagnosis not present

## 2020-12-19 DIAGNOSIS — K219 Gastro-esophageal reflux disease without esophagitis: Secondary | ICD-10-CM | POA: Diagnosis not present

## 2020-12-19 DIAGNOSIS — G629 Polyneuropathy, unspecified: Secondary | ICD-10-CM | POA: Diagnosis not present

## 2020-12-19 DIAGNOSIS — E785 Hyperlipidemia, unspecified: Secondary | ICD-10-CM | POA: Diagnosis not present

## 2020-12-19 DIAGNOSIS — F411 Generalized anxiety disorder: Secondary | ICD-10-CM | POA: Diagnosis not present

## 2020-12-19 DIAGNOSIS — R7301 Impaired fasting glucose: Secondary | ICD-10-CM | POA: Diagnosis not present

## 2020-12-21 DIAGNOSIS — G4733 Obstructive sleep apnea (adult) (pediatric): Secondary | ICD-10-CM | POA: Diagnosis not present

## 2021-03-28 DIAGNOSIS — E782 Mixed hyperlipidemia: Secondary | ICD-10-CM | POA: Diagnosis not present

## 2021-03-28 DIAGNOSIS — I10 Essential (primary) hypertension: Secondary | ICD-10-CM | POA: Diagnosis not present

## 2021-04-05 DIAGNOSIS — K219 Gastro-esophageal reflux disease without esophagitis: Secondary | ICD-10-CM | POA: Diagnosis not present

## 2021-04-05 DIAGNOSIS — Z1211 Encounter for screening for malignant neoplasm of colon: Secondary | ICD-10-CM | POA: Diagnosis not present

## 2021-04-05 DIAGNOSIS — Z0001 Encounter for general adult medical examination with abnormal findings: Secondary | ICD-10-CM | POA: Diagnosis not present

## 2021-04-05 DIAGNOSIS — F411 Generalized anxiety disorder: Secondary | ICD-10-CM | POA: Diagnosis not present

## 2021-04-05 DIAGNOSIS — G629 Polyneuropathy, unspecified: Secondary | ICD-10-CM | POA: Diagnosis not present

## 2021-04-05 DIAGNOSIS — R7301 Impaired fasting glucose: Secondary | ICD-10-CM | POA: Diagnosis not present

## 2021-04-05 DIAGNOSIS — E785 Hyperlipidemia, unspecified: Secondary | ICD-10-CM | POA: Diagnosis not present

## 2021-04-05 DIAGNOSIS — R944 Abnormal results of kidney function studies: Secondary | ICD-10-CM | POA: Diagnosis not present

## 2021-05-01 ENCOUNTER — Encounter (INDEPENDENT_AMBULATORY_CARE_PROVIDER_SITE_OTHER): Payer: Self-pay | Admitting: *Deleted

## 2021-05-02 DIAGNOSIS — E785 Hyperlipidemia, unspecified: Secondary | ICD-10-CM | POA: Diagnosis not present

## 2021-05-02 DIAGNOSIS — R944 Abnormal results of kidney function studies: Secondary | ICD-10-CM | POA: Diagnosis not present

## 2021-08-26 DIAGNOSIS — R7301 Impaired fasting glucose: Secondary | ICD-10-CM | POA: Diagnosis not present

## 2021-08-26 DIAGNOSIS — E785 Hyperlipidemia, unspecified: Secondary | ICD-10-CM | POA: Diagnosis not present

## 2021-08-26 DIAGNOSIS — Z0000 Encounter for general adult medical examination without abnormal findings: Secondary | ICD-10-CM | POA: Diagnosis not present

## 2021-09-09 ENCOUNTER — Telehealth: Payer: Self-pay | Admitting: Internal Medicine

## 2021-09-09 NOTE — Telephone Encounter (Signed)
Called and spoke with pt stating to him since we have not seen him in 3 years that we would not be able to write him the letter and stated to him to check with PCP and he verbalized understanding. Nothing further needed.

## 2021-09-10 DIAGNOSIS — E785 Hyperlipidemia, unspecified: Secondary | ICD-10-CM | POA: Diagnosis not present

## 2021-09-10 DIAGNOSIS — E669 Obesity, unspecified: Secondary | ICD-10-CM | POA: Diagnosis not present

## 2021-09-10 DIAGNOSIS — R944 Abnormal results of kidney function studies: Secondary | ICD-10-CM | POA: Diagnosis not present

## 2021-09-10 DIAGNOSIS — F411 Generalized anxiety disorder: Secondary | ICD-10-CM | POA: Diagnosis not present

## 2021-09-10 DIAGNOSIS — G629 Polyneuropathy, unspecified: Secondary | ICD-10-CM | POA: Diagnosis not present

## 2021-09-10 DIAGNOSIS — K219 Gastro-esophageal reflux disease without esophagitis: Secondary | ICD-10-CM | POA: Diagnosis not present

## 2021-09-10 DIAGNOSIS — R7301 Impaired fasting glucose: Secondary | ICD-10-CM | POA: Diagnosis not present

## 2021-09-10 DIAGNOSIS — K581 Irritable bowel syndrome with constipation: Secondary | ICD-10-CM | POA: Diagnosis not present

## 2021-09-10 DIAGNOSIS — Z1211 Encounter for screening for malignant neoplasm of colon: Secondary | ICD-10-CM | POA: Diagnosis not present

## 2021-10-09 IMAGING — NM NM PULMONARY VENT & PERF
8 series · 8 of 8 positions shown · non-contrast
Comparison: Chest x-ray 05/02/2020

CLINICAL DATA: Shortness of breath since [REDACTED].

EXAM:
NUCLEAR MEDICINE PERFUSION LUNG SCAN
TECHNIQUE: Perfusion images were obtained in multiple projections after
intravenous injection of radiopharmaceutical.
Ventilation scans intentionally deferred if perfusion scan and chest
x-ray adequate for interpretation during COVID 19 epidemic.
RADIOPHARMACEUTICALS:  4.0 mCi 9c-DDm MAA IV

[Series 1: ant/post perf · 4.14mm/px · 1 of 1 slices shown (1 of 2)]
[im 1/1]
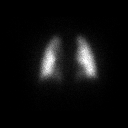

[Series 1: ant/post perf · 4.14mm/px · 1 of 1 slices shown (2 of 2)]
[im 1/1]
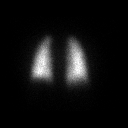

[Series 2: lao/rpo perf · 4.14mm/px · 1 of 1 slices shown (1 of 2)]
[im 1/1]
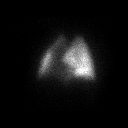

[Series 2: lao/rpo perf · 4.14mm/px · 1 of 1 slices shown (2 of 2)]
[im 1/1]
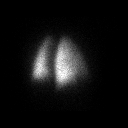

[Series 3: lt lat/rt lat perf · 4.14mm/px · 1 of 1 slices shown (1 of 2)]
[im 1/1]
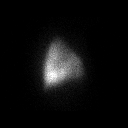

[Series 3: lt lat/rt lat perf · 4.14mm/px · 1 of 1 slices shown (2 of 2)]
[im 1/1]
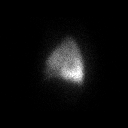

[Series 4: lpo/rao perf · 4.14mm/px · 1 of 1 slices shown (1 of 2)]
[im 1/1]
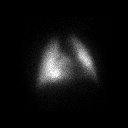

[Series 4: lpo/rao perf · 4.14mm/px · 1 of 1 slices shown (2 of 2)]
[im 1/1]
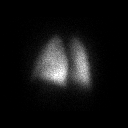

[8 of 8 positions shown; findings below may reference images not displayed]

FINDINGS: Normal perfusion lung scan. No segmental or subsegmental perfusion
defects to suggest pulmonary embolism.
IMPRESSION: Negative perfusion lung scan for pulmonary embolism.

## 2021-10-29 ENCOUNTER — Encounter (INDEPENDENT_AMBULATORY_CARE_PROVIDER_SITE_OTHER): Payer: Self-pay | Admitting: *Deleted

## 2021-11-14 DIAGNOSIS — F430 Acute stress reaction: Secondary | ICD-10-CM | POA: Diagnosis not present

## 2021-11-14 DIAGNOSIS — G629 Polyneuropathy, unspecified: Secondary | ICD-10-CM | POA: Diagnosis not present

## 2021-11-14 DIAGNOSIS — F411 Generalized anxiety disorder: Secondary | ICD-10-CM | POA: Diagnosis not present

## 2021-11-14 DIAGNOSIS — J302 Other seasonal allergic rhinitis: Secondary | ICD-10-CM | POA: Diagnosis not present

## 2021-11-14 DIAGNOSIS — L989 Disorder of the skin and subcutaneous tissue, unspecified: Secondary | ICD-10-CM | POA: Diagnosis not present

## 2021-11-27 DIAGNOSIS — L570 Actinic keratosis: Secondary | ICD-10-CM | POA: Diagnosis not present

## 2021-11-27 DIAGNOSIS — D225 Melanocytic nevi of trunk: Secondary | ICD-10-CM | POA: Diagnosis not present

## 2021-11-27 DIAGNOSIS — L281 Prurigo nodularis: Secondary | ICD-10-CM | POA: Diagnosis not present

## 2021-11-27 DIAGNOSIS — L821 Other seborrheic keratosis: Secondary | ICD-10-CM | POA: Diagnosis not present

## 2021-11-27 DIAGNOSIS — L309 Dermatitis, unspecified: Secondary | ICD-10-CM | POA: Diagnosis not present

## 2021-12-27 LAB — COLOGUARD

## 2022-01-15 DIAGNOSIS — Z1211 Encounter for screening for malignant neoplasm of colon: Secondary | ICD-10-CM | POA: Diagnosis not present

## 2022-01-22 LAB — EXTERNAL GENERIC LAB PROCEDURE: COLOGUARD: NEGATIVE

## 2022-01-22 LAB — COLOGUARD: COLOGUARD: NEGATIVE

## 2022-01-31 DIAGNOSIS — J019 Acute sinusitis, unspecified: Secondary | ICD-10-CM | POA: Diagnosis not present

## 2022-01-31 DIAGNOSIS — Z6831 Body mass index (BMI) 31.0-31.9, adult: Secondary | ICD-10-CM | POA: Diagnosis not present

## 2022-01-31 DIAGNOSIS — E669 Obesity, unspecified: Secondary | ICD-10-CM | POA: Diagnosis not present

## 2022-02-09 DIAGNOSIS — R944 Abnormal results of kidney function studies: Secondary | ICD-10-CM | POA: Diagnosis not present

## 2022-03-31 DIAGNOSIS — F411 Generalized anxiety disorder: Secondary | ICD-10-CM | POA: Diagnosis not present

## 2022-03-31 DIAGNOSIS — R7301 Impaired fasting glucose: Secondary | ICD-10-CM | POA: Diagnosis not present

## 2022-03-31 DIAGNOSIS — G629 Polyneuropathy, unspecified: Secondary | ICD-10-CM | POA: Diagnosis not present

## 2022-03-31 DIAGNOSIS — K581 Irritable bowel syndrome with constipation: Secondary | ICD-10-CM | POA: Diagnosis not present

## 2022-03-31 DIAGNOSIS — K219 Gastro-esophageal reflux disease without esophagitis: Secondary | ICD-10-CM | POA: Diagnosis not present

## 2022-03-31 DIAGNOSIS — Z0000 Encounter for general adult medical examination without abnormal findings: Secondary | ICD-10-CM | POA: Diagnosis not present

## 2022-03-31 DIAGNOSIS — E785 Hyperlipidemia, unspecified: Secondary | ICD-10-CM | POA: Diagnosis not present

## 2022-03-31 DIAGNOSIS — J302 Other seasonal allergic rhinitis: Secondary | ICD-10-CM | POA: Diagnosis not present

## 2022-03-31 DIAGNOSIS — R944 Abnormal results of kidney function studies: Secondary | ICD-10-CM | POA: Diagnosis not present

## 2022-07-04 DIAGNOSIS — H6093 Unspecified otitis externa, bilateral: Secondary | ICD-10-CM | POA: Diagnosis not present

## 2022-08-06 ENCOUNTER — Other Ambulatory Visit (HOSPITAL_COMMUNITY): Payer: Self-pay | Admitting: Internal Medicine

## 2022-08-06 DIAGNOSIS — R591 Generalized enlarged lymph nodes: Secondary | ICD-10-CM | POA: Diagnosis not present

## 2022-08-06 DIAGNOSIS — R599 Enlarged lymph nodes, unspecified: Secondary | ICD-10-CM

## 2022-08-07 ENCOUNTER — Ambulatory Visit (HOSPITAL_BASED_OUTPATIENT_CLINIC_OR_DEPARTMENT_OTHER)
Admission: RE | Admit: 2022-08-07 | Discharge: 2022-08-07 | Disposition: A | Discharge status: Home / Self Care | Payer: Medicare HMO | Source: Ambulatory Visit | Attending: Internal Medicine | Admitting: Internal Medicine

## 2022-08-07 DIAGNOSIS — R591 Generalized enlarged lymph nodes: Secondary | ICD-10-CM | POA: Insufficient documentation

## 2022-08-07 DIAGNOSIS — R599 Enlarged lymph nodes, unspecified: Secondary | ICD-10-CM | POA: Insufficient documentation

## 2022-08-07 DIAGNOSIS — R590 Localized enlarged lymph nodes: Secondary | ICD-10-CM | POA: Diagnosis not present

## 2023-01-15 ENCOUNTER — Encounter: Payer: Self-pay | Admitting: Emergency Medicine

## 2023-01-15 ENCOUNTER — Ambulatory Visit
Admission: EM | Admit: 2023-01-15 | Discharge: 2023-01-15 | Disposition: A | Discharge status: Home / Self Care | Payer: Medicare HMO | Attending: Family Medicine | Admitting: Family Medicine

## 2023-01-15 DIAGNOSIS — J4521 Mild intermittent asthma with (acute) exacerbation: Secondary | ICD-10-CM

## 2023-01-15 DIAGNOSIS — J40 Bronchitis, not specified as acute or chronic: Secondary | ICD-10-CM

## 2023-01-15 DIAGNOSIS — J069 Acute upper respiratory infection, unspecified: Secondary | ICD-10-CM | POA: Diagnosis not present

## 2023-01-15 LAB — POCT INFLUENZA A/B
Influenza A, POC: NEGATIVE
Influenza B, POC: NEGATIVE

## 2023-01-15 MED ORDER — PROMETHAZINE-DM 6.25-15 MG/5ML PO SYRP: 5.0000 mL | ORAL_SOLUTION | Freq: Four times a day (QID) | ORAL | 0 refills | Status: DC | PRN

## 2023-01-15 MED ORDER — DEXAMETHASONE SODIUM PHOSPHATE 10 MG/ML IJ SOLN
10.0000 mg | Freq: Once | INTRAMUSCULAR | Status: AC
  Administered 2023-01-15: 10 mg via INTRAMUSCULAR

## 2023-01-15 MED ORDER — ALBUTEROL SULFATE HFA 108 (90 BASE) MCG/ACT IN AERS: 2.0000 | INHALATION_SPRAY | RESPIRATORY_TRACT | 0 refills | Status: AC | PRN

## 2023-01-15 MED ORDER — PREDNISONE 20 MG PO TABS: 40.0000 mg | ORAL_TABLET | Freq: Every day | ORAL | 0 refills | Status: DC

## 2023-01-15 NOTE — ED Triage Notes (Signed)
Productive Cough, nasal congestion, body aches since Tuesday.  Home covid test last night was negative.

## 2023-01-15 NOTE — ED Provider Notes (Signed)
[facility]    CSN: [id number] Arrival date & time: 01/15/23  1519      History   Chief Complaint No chief complaint on file.   HPI Zachary Nash is a 52 y.o. male.   Patient presenting today with 5 day history of hacking productive cough, chest tightness, nasal congestion, body aches, fatigue.  Denies chest pain, shortness of breath, abdominal pain, nausea vomiting or diarrhea.  History of asthma on albuterol as needed but does not have any more left.  Home COVID test was negative.    Past Medical History:  Diagnosis Date   Anxiety    Arthritis    lower back, hands   Asthma    Bilateral inguinal hernia 05/29/2016   surgery to repair   Constipation    DDD (degenerative disc disease), lumbar    Fatty liver    GERD (gastroesophageal reflux disease)    History of hiatal hernia    History of kidney stones 2006   passed stone, no surgery   Pneumonia    PONV (postoperative nausea and vomiting)    pt also states his O2 drops after surgery   Radicular pain of left lower extremity    Sleep apnea    patient uses 2 L oxygen night - no cpap, 07/06/19 on CPAP    Patient Active Problem List   Diagnosis Date Noted   Chronic idiopathic constipation 08/26/2020   Chronic superficial gastritis without bleeding 08/26/2020   Gastro-esophageal reflux disease with esophagitis 08/26/2020   Esophageal motility disorder 08/26/2020   COVID-19 01/10/2020   Hypertrophy of both inferior nasal turbinates 12/05/2019   Nasal septal deviation 12/05/2019   Recurrent tonsillitis 12/05/2019   Sleep apnea, obstructive 12/05/2019   Low back pain 03/28/2019   Neck pain 03/28/2019   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   Excessive daytime sleepiness 01/05/2019   PLMD (periodic limb movement disorder) 01/05/2019   Chronic insomnia 01/05/2019   Upper airway cough syndrome 03/31/2018   Cough variant asthma 03/28/2018   DOE (dyspnea on exertion) 03/09/2018   Nocturnal hypoxemia 03/09/2018    Left leg weakness 07/27/2017   Numbness of lower limb 07/27/2017   Spinal cord compression [facility])    Surgery, elective    Cervical myelopathy (HCC)    Syncope, vasovagal    Weakness    Generalized weakness 05/25/2017   Left sided numbness 05/24/2017   Right inguinal pain 04/20/2017   Dysphagia 03/24/2017   Bilateral inguinal hernia 05/29/2016   Post-traumatic headache 05/18/2013   Hyperglycemia 05/18/2013   Radicular pain of left lower extremity    Dizziness 05/16/2013    Past Surgical History:  Procedure Laterality Date   ANTERIOR CERVICAL DECOMP/DISCECTOMY FUSION N/A 05/26/2017   Procedure: ANTERIOR CERVICAL DECOMPRESSION/DISCECTOMY FUSION TWO LEVELS Cervical five-six, Cervical six-seven;  Surgeon: Estill Bamberg, MD;  Location: [facility];  Service: Orthopedics;  Laterality: N/A;   BIOPSY  04/11/2018   Procedure: BIOPSY;  Surgeon: Kathi Der, MD;  Location: [facility];  Service: Gastroenterology;;   CHOLECYSTECTOMY     ESOPHAGEAL DILATION N/A 03/24/2017   Procedure: ESOPHAGEAL DILATION;  Surgeon: Malissa Hippo, MD;  Location: [facility];  Service: Endoscopy;  Laterality: N/A;   ESOPHAGEAL MANOMETRY N/A 04/11/2018   Procedure: ESOPHAGEAL MANOMETRY (EM);  Surgeon: Kathi Der, MD;  Location: [facility];  Service: Gastroenterology;  Laterality: N/A;   ESOPHAGOGASTRODUODENOSCOPY N/A 03/24/2017   Procedure: ESOPHAGOGASTRODUODENOSCOPY (EGD);  Surgeon: Malissa Hippo, MD;  Location: [facility];  Service: Endoscopy;  Laterality: N/A;  1155   ESOPHAGOGASTRODUODENOSCOPY (EGD) WITH PROPOFOL N/A 04/11/2018   Procedure: ESOPHAGOGASTRODUODENOSCOPY (EGD) WITH PROPOFOL;  Surgeon: Kathi Der, MD;  Location: [facility];  Service: Gastroenterology;  Laterality: N/A;  mano probe to be placed during EGD   INGUINAL HERNIA REPAIR Bilateral 05/29/2016   Procedure: OPEN HERNIA REPAIR INGUINAL ADULT BILATERAL WITH insertion of MESH;  Surgeon: Claud Kelp, MD;  Location: [facility]  OR;  Service: General;  Laterality: Bilateral;   Lipoma removed     RADIOLOGY WITH ANESTHESIA N/A 06/14/2018   Procedure: MRI LUMBER SPINE WITHOUT CONTRAST;  Surgeon: Radiologist, Medication, MD;  Location: [facility] OR;  Service: Radiology;  Laterality: N/A;   WISDOM TOOTH EXTRACTION         Home Medications    Prior to Admission medications   Medication Sig Start Date End Date Taking? Authorizing Provider  predniSONE (DELTASONE) 20 MG tablet Take 2 tablets (40 mg total) by mouth daily with breakfast. 01/15/23  Yes Particia Nearing, PA-C  promethazine-dextromethorphan (PROMETHAZINE-DM) 6.25-15 MG/5ML syrup Take 5 mLs by mouth 4 (four) times daily as needed. 01/15/23  Yes Particia Nearing, PA-C  albuterol (VENTOLIN HFA) 108 (90 Base) MCG/ACT inhaler Inhale 2 puffs into the lungs every 4 (four) hours as needed for wheezing or shortness of breath. 01/15/23   Particia Nearing, PA-C  cetirizine (ZYRTEC ALLERGY) 10 MG tablet Take 1 tablet (10 mg total) by mouth daily. 04/28/20   Avegno, Zachery Dakins, FNP  DULoxetine (CYMBALTA) 20 MG capsule Take 20 mg by mouth at bedtime.  02/03/18   [provider]  pantoprazole (PROTONIX) 40 MG tablet Take 1 tablet (40 mg total) by mouth 2 (two) times daily before a meal. 05/03/19   Ardelia Mems, PA-C    Family History Family History  Problem Relation Age of Onset   Heart failure Father    Heart attack Father    Ovarian cancer Mother    Arthritis Mother    Anxiety disorder Mother     Social History Social History   Tobacco Use   Smoking status: Never   Smokeless tobacco: Never  Vaping Use   Vaping status: Never Used  Substance Use Topics   Alcohol use: No   Drug use: No     Allergies   Dilaudid [hydromorphone hcl], Lidocaine, Penicillins, and Contrast media [iodinated contrast media]   Review of Systems Review of Systems Per HPI  Physical Exam Triage Vital Signs ED Triage Vitals  Encounter Vitals Group     BP  01/15/23 1643 120/81     Systolic BP Percentile --      Diastolic BP Percentile --      Pulse Rate 01/15/23 1643 96     Resp 01/15/23 1643 18     Temp 01/15/23 1643 99.3 F (37.4 C)     Temp Source 01/15/23 1643 Oral     SpO2 01/15/23 1643 98 %     Weight --      Height --      Head Circumference --      Peak Flow --      Pain Score 01/15/23 1644 7     Pain Loc --      Pain Education --      Exclude from Growth Chart --    No data found.  Updated Vital Signs BP 120/81 (BP Location: Right Arm)   Pulse 96   Temp 99.3 F (37.4 C) (Oral)   Resp 18   SpO2 98%   Visual  Acuity Right Eye Distance:   Left Eye Distance:   Bilateral Distance:    Right Eye Near:   Left Eye Near:    Bilateral Near:     Physical Exam Vitals and nursing note reviewed.  Constitutional:      Appearance: He is well-developed.  HENT:     Head: Atraumatic.     Right Ear: External ear normal.     Left Ear: External ear normal.     Nose: Rhinorrhea present.     Mouth/Throat:     Pharynx: Posterior oropharyngeal erythema present. No oropharyngeal exudate.  Eyes:     Conjunctiva/sclera: Conjunctivae normal.     Pupils: Pupils are equal, round, and reactive to light.  Cardiovascular:     Rate and Rhythm: Normal rate and regular rhythm.  Pulmonary:     Effort: Pulmonary effort is normal. No respiratory distress.     Breath sounds: Wheezing present. No rales.  Musculoskeletal:        General: Normal range of motion.     Cervical back: Normal range of motion and neck supple.  Lymphadenopathy:     Cervical: No cervical adenopathy.  Skin:    General: Skin is warm and dry.  Neurological:     Mental Status: He is alert and oriented to person, place, and time.  Psychiatric:        Behavior: Behavior normal.      UC Treatments / Results  Labs (all labs ordered are listed, but only abnormal results are displayed) Labs Reviewed  POCT INFLUENZA A/B    EKG   Radiology No results  found.  Procedures Procedures (including critical care time)  Medications Ordered in UC Medications  dexamethasone (DECADRON) injection 10 mg (10 mg Intramuscular Given 01/15/23 1727)    Initial Impression / Assessment and Plan / UC Course  I have reviewed the triage vital signs and the nursing notes.  Pertinent labs & imaging results that were available during my care of the patient were reviewed by me and considered in my medical decision making (see chart for details).     Vitals overall reassuring today, rapid flu negative, suspect viral respiratory infection causing an asthma exacerbation.  Treat with IM Decadron, albuterol, Phenergan DM, supportive over-the-counter medications and home care.  If not resolving may take oral prednisone.  Return for worsening symptoms.  Final Clinical Impressions(s) / UC Diagnoses   Final diagnoses:  Viral URI with cough  Mild intermittent asthma with acute exacerbation   Discharge Instructions   None    ED Prescriptions     Medication Sig Dispense Auth. Provider   albuterol (VENTOLIN HFA) 108 (90 Base) MCG/ACT inhaler Inhale 2 puffs into the lungs every 4 (four) hours as needed for wheezing or shortness of breath. 18 g Particia Nearing, [place]   predniSONE (DELTASONE) 20 MG tablet Take 2 tablets (40 mg total) by mouth daily with breakfast. 10 tablet Particia Nearing, PA-C   promethazine-dextromethorphan (PROMETHAZINE-DM) 6.25-15 MG/5ML syrup Take 5 mLs by mouth 4 (four) times daily as needed. 100 mL Particia Nearing, [place]      PDMP not reviewed this encounter.   Particia Nearing, [place] 01/15/23 [phone]

## 2023-01-28 ENCOUNTER — Other Ambulatory Visit: Payer: Self-pay | Admitting: Family Medicine

## 2023-01-28 DIAGNOSIS — J40 Bronchitis, not specified as acute or chronic: Secondary | ICD-10-CM

## 2023-03-04 DIAGNOSIS — M79641 Pain in right hand: Secondary | ICD-10-CM | POA: Diagnosis not present

## 2023-03-04 DIAGNOSIS — F411 Generalized anxiety disorder: Secondary | ICD-10-CM | POA: Diagnosis not present

## 2023-03-04 DIAGNOSIS — R7301 Impaired fasting glucose: Secondary | ICD-10-CM | POA: Diagnosis not present

## 2023-03-04 DIAGNOSIS — R944 Abnormal results of kidney function studies: Secondary | ICD-10-CM | POA: Diagnosis not present

## 2023-03-04 DIAGNOSIS — Z136 Encounter for screening for cardiovascular disorders: Secondary | ICD-10-CM | POA: Diagnosis not present

## 2023-03-04 DIAGNOSIS — M545 Low back pain, unspecified: Secondary | ICD-10-CM | POA: Diagnosis not present

## 2023-03-05 DIAGNOSIS — M545 Low back pain, unspecified: Secondary | ICD-10-CM | POA: Diagnosis not present

## 2023-03-05 DIAGNOSIS — M79644 Pain in right finger(s): Secondary | ICD-10-CM | POA: Diagnosis not present

## 2023-03-05 DIAGNOSIS — M542 Cervicalgia: Secondary | ICD-10-CM | POA: Diagnosis not present

## 2023-03-11 DIAGNOSIS — Z6829 Body mass index (BMI) 29.0-29.9, adult: Secondary | ICD-10-CM | POA: Diagnosis not present

## 2023-03-11 DIAGNOSIS — M542 Cervicalgia: Secondary | ICD-10-CM | POA: Diagnosis not present

## 2023-03-11 DIAGNOSIS — M5441 Lumbago with sciatica, right side: Secondary | ICD-10-CM | POA: Diagnosis not present

## 2023-03-11 DIAGNOSIS — R202 Paresthesia of skin: Secondary | ICD-10-CM | POA: Diagnosis not present

## 2023-03-11 DIAGNOSIS — R200 Anesthesia of skin: Secondary | ICD-10-CM | POA: Diagnosis not present

## 2023-03-13 DIAGNOSIS — H524 Presbyopia: Secondary | ICD-10-CM | POA: Diagnosis not present

## 2023-03-13 DIAGNOSIS — H43393 Other vitreous opacities, bilateral: Secondary | ICD-10-CM | POA: Diagnosis not present

## 2023-03-13 DIAGNOSIS — H5213 Myopia, bilateral: Secondary | ICD-10-CM | POA: Diagnosis not present

## 2023-03-16 DIAGNOSIS — H539 Unspecified visual disturbance: Secondary | ICD-10-CM | POA: Diagnosis not present

## 2023-03-16 DIAGNOSIS — H5711 Ocular pain, right eye: Secondary | ICD-10-CM | POA: Diagnosis not present

## 2023-03-16 DIAGNOSIS — H538 Other visual disturbances: Secondary | ICD-10-CM | POA: Diagnosis not present

## 2023-03-16 DIAGNOSIS — H40052 Ocular hypertension, left eye: Secondary | ICD-10-CM | POA: Diagnosis not present

## 2023-03-16 DIAGNOSIS — Z79899 Other long term (current) drug therapy: Secondary | ICD-10-CM | POA: Diagnosis not present

## 2023-03-16 DIAGNOSIS — H53462 Homonymous bilateral field defects, left side: Secondary | ICD-10-CM | POA: Diagnosis not present

## 2023-03-17 DIAGNOSIS — M4722 Other spondylosis with radiculopathy, cervical region: Secondary | ICD-10-CM | POA: Diagnosis not present

## 2023-03-19 DIAGNOSIS — R5383 Other fatigue: Secondary | ICD-10-CM | POA: Diagnosis not present

## 2023-03-19 DIAGNOSIS — E785 Hyperlipidemia, unspecified: Secondary | ICD-10-CM | POA: Diagnosis not present

## 2023-03-19 DIAGNOSIS — H3581 Retinal edema: Secondary | ICD-10-CM | POA: Diagnosis not present

## 2023-03-19 DIAGNOSIS — R7301 Impaired fasting glucose: Secondary | ICD-10-CM | POA: Diagnosis not present

## 2023-03-19 DIAGNOSIS — R110 Nausea: Secondary | ICD-10-CM | POA: Diagnosis not present

## 2023-03-19 DIAGNOSIS — Z0001 Encounter for general adult medical examination with abnormal findings: Secondary | ICD-10-CM | POA: Diagnosis not present

## 2023-03-19 DIAGNOSIS — R944 Abnormal results of kidney function studies: Secondary | ICD-10-CM | POA: Diagnosis not present

## 2023-03-19 DIAGNOSIS — Z23 Encounter for immunization: Secondary | ICD-10-CM | POA: Diagnosis not present

## 2023-03-19 DIAGNOSIS — G629 Polyneuropathy, unspecified: Secondary | ICD-10-CM | POA: Diagnosis not present

## 2023-03-19 DIAGNOSIS — K219 Gastro-esophageal reflux disease without esophagitis: Secondary | ICD-10-CM | POA: Diagnosis not present

## 2023-03-23 DIAGNOSIS — G4733 Obstructive sleep apnea (adult) (pediatric): Secondary | ICD-10-CM | POA: Diagnosis not present

## 2023-03-23 DIAGNOSIS — H532 Diplopia: Secondary | ICD-10-CM | POA: Diagnosis not present

## 2023-03-23 DIAGNOSIS — H538 Other visual disturbances: Secondary | ICD-10-CM | POA: Diagnosis not present

## 2023-03-23 DIAGNOSIS — H5022 Vertical strabismus, left eye: Secondary | ICD-10-CM | POA: Diagnosis not present

## 2023-03-23 DIAGNOSIS — G43009 Migraine without aura, not intractable, without status migrainosus: Secondary | ICD-10-CM | POA: Diagnosis not present

## 2023-03-23 DIAGNOSIS — R410 Disorientation, unspecified: Secondary | ICD-10-CM | POA: Diagnosis not present

## 2023-03-23 DIAGNOSIS — H539 Unspecified visual disturbance: Secondary | ICD-10-CM | POA: Diagnosis not present

## 2023-03-23 DIAGNOSIS — K219 Gastro-esophageal reflux disease without esophagitis: Secondary | ICD-10-CM | POA: Diagnosis not present

## 2023-03-23 DIAGNOSIS — R7303 Prediabetes: Secondary | ICD-10-CM | POA: Diagnosis not present

## 2023-03-23 DIAGNOSIS — R42 Dizziness and giddiness: Secondary | ICD-10-CM | POA: Diagnosis not present

## 2023-03-23 DIAGNOSIS — J45909 Unspecified asthma, uncomplicated: Secondary | ICD-10-CM | POA: Diagnosis not present

## 2023-03-24 DIAGNOSIS — R7303 Prediabetes: Secondary | ICD-10-CM | POA: Diagnosis not present

## 2023-03-24 DIAGNOSIS — J45909 Unspecified asthma, uncomplicated: Secondary | ICD-10-CM | POA: Diagnosis not present

## 2023-03-24 DIAGNOSIS — R42 Dizziness and giddiness: Secondary | ICD-10-CM | POA: Diagnosis not present

## 2023-03-24 DIAGNOSIS — G43009 Migraine without aura, not intractable, without status migrainosus: Secondary | ICD-10-CM | POA: Diagnosis not present

## 2023-03-24 DIAGNOSIS — H532 Diplopia: Secondary | ICD-10-CM | POA: Diagnosis not present

## 2023-03-24 DIAGNOSIS — R410 Disorientation, unspecified: Secondary | ICD-10-CM | POA: Diagnosis not present

## 2023-03-24 DIAGNOSIS — H539 Unspecified visual disturbance: Secondary | ICD-10-CM | POA: Diagnosis not present

## 2023-03-24 DIAGNOSIS — H5022 Vertical strabismus, left eye: Secondary | ICD-10-CM | POA: Diagnosis not present

## 2023-03-24 DIAGNOSIS — G4733 Obstructive sleep apnea (adult) (pediatric): Secondary | ICD-10-CM | POA: Diagnosis not present

## 2023-03-24 DIAGNOSIS — K219 Gastro-esophageal reflux disease without esophagitis: Secondary | ICD-10-CM | POA: Diagnosis not present

## 2023-03-25 DIAGNOSIS — I6789 Other cerebrovascular disease: Secondary | ICD-10-CM | POA: Diagnosis not present

## 2023-03-25 DIAGNOSIS — R000 Tachycardia, unspecified: Secondary | ICD-10-CM | POA: Diagnosis not present

## 2023-03-26 DIAGNOSIS — M47812 Spondylosis without myelopathy or radiculopathy, cervical region: Secondary | ICD-10-CM | POA: Diagnosis not present

## 2023-03-26 DIAGNOSIS — M2420 Disorder of ligament, unspecified site: Secondary | ICD-10-CM | POA: Diagnosis not present

## 2023-03-30 DIAGNOSIS — R519 Headache, unspecified: Secondary | ICD-10-CM | POA: Diagnosis not present

## 2023-04-21 DIAGNOSIS — M2420 Disorder of ligament, unspecified site: Secondary | ICD-10-CM | POA: Diagnosis not present

## 2023-04-21 DIAGNOSIS — M47812 Spondylosis without myelopathy or radiculopathy, cervical region: Secondary | ICD-10-CM | POA: Diagnosis not present

## 2023-04-23 DIAGNOSIS — H539 Unspecified visual disturbance: Secondary | ICD-10-CM | POA: Diagnosis not present

## 2023-04-23 DIAGNOSIS — H4912 Fourth [trochlear] nerve palsy, left eye: Secondary | ICD-10-CM | POA: Diagnosis not present

## 2023-04-27 DIAGNOSIS — J09X2 Influenza due to identified novel influenza A virus with other respiratory manifestations: Secondary | ICD-10-CM | POA: Diagnosis not present

## 2023-04-27 DIAGNOSIS — J101 Influenza due to other identified influenza virus with other respiratory manifestations: Secondary | ICD-10-CM | POA: Diagnosis not present

## 2023-04-27 DIAGNOSIS — R059 Cough, unspecified: Secondary | ICD-10-CM | POA: Diagnosis not present

## 2023-05-07 DIAGNOSIS — J45909 Unspecified asthma, uncomplicated: Secondary | ICD-10-CM | POA: Diagnosis not present

## 2023-05-07 DIAGNOSIS — M6148 Other calcification of muscle, other site: Secondary | ICD-10-CM | POA: Diagnosis not present

## 2023-05-07 DIAGNOSIS — M898X8 Other specified disorders of bone, other site: Secondary | ICD-10-CM | POA: Diagnosis not present

## 2023-05-07 DIAGNOSIS — G4733 Obstructive sleep apnea (adult) (pediatric): Secondary | ICD-10-CM | POA: Diagnosis not present

## 2023-05-07 DIAGNOSIS — I10 Essential (primary) hypertension: Secondary | ICD-10-CM | POA: Diagnosis not present

## 2023-05-07 DIAGNOSIS — R1311 Dysphagia, oral phase: Secondary | ICD-10-CM | POA: Diagnosis not present

## 2023-05-07 DIAGNOSIS — K219 Gastro-esophageal reflux disease without esophagitis: Secondary | ICD-10-CM | POA: Diagnosis not present

## 2023-05-07 DIAGNOSIS — M2420 Disorder of ligament, unspecified site: Secondary | ICD-10-CM | POA: Diagnosis not present

## 2023-05-10 ENCOUNTER — Telehealth: Payer: Self-pay

## 2023-05-10 NOTE — Transitions of Care (Post Inpatient/ED Visit) (Signed)
   05/10/2023  Name: Zachary Nash MRN: [id number] DOB: 10/07/1970  Today's TOC FU Call Status: Today's TOC FU Call Status:: Unsuccessful Call (1st Attempt) Unsuccessful Call (1st Attempt) Date: 05/10/23  Attempted to reach the patient regarding the most recent Inpatient/ED visit. Patient was called in an Outreach attempt to offer VBCI  30-day TOC program. Pt is eligible for program due to potential risk for readmission and/or high utilization. Unfortunately, I was not able to speak with the patient in regards to recent hospital discharge     Patient's voicemail has  a generic greeting. To maintain HIPAA compliance, left message with VBCI CM contact information only  and a request for a call back .   Follow Up Plan: Additional outreach attempts will be made to reach the patient to complete the Transitions of Care (Post Inpatient/ED visit) call.   James Mcardle , BSN, RN [facility]   VBCI-Population Health RN Care Manager Direct Dial [phone]  Fax: [phone] Website: [email or web address]

## 2023-05-12 ENCOUNTER — Telehealth: Payer: Self-pay

## 2023-05-12 NOTE — Transitions of Care (Post Inpatient/ED Visit) (Signed)
   05/12/2023  Name: Zachary Nash MRN: [id number] DOB: Jul 10, 1970  Today's TOC FU Call Status: Today's TOC FU Call Status:: Unsuccessful Call (2nd Attempt) Unsuccessful Call (1st Attempt) Date: 05/10/23 Unsuccessful Call (2nd Attempt) Date: 05/12/23  Attempted to reach the patient regarding the most recent Inpatient/ED visit. Patient was called in an Outreach attempt to offer VBCI  30-day TOC program. Pt is eligible for program due to potential risk for readmission and/or high utilization. Unfortunately, I was not able to speak with the patient in regards to recent hospital discharge   Left a HIPAA compliant phone message message for patient including VBCI CM contact information with request for a call back in regard to recent hospital discharge  Follow Up Plan: Additional outreach attempts will be made to reach the patient to complete the Transitions of Care (Post Inpatient/ED visit) call.   Susa Loffler , BSN, RN [facility]   VBCI-Population Health RN Care Manager Direct Dial [phone]  Fax: [phone] Website: [email or web address]

## 2023-05-13 ENCOUNTER — Telehealth: Payer: Self-pay

## 2023-05-13 NOTE — Transitions of Care (Post Inpatient/ED Visit) (Signed)
   05/13/2023  Name: Zachary Nash MRN: [id number] DOB: Aug 17, 1970  Today's TOC FU Call Status: Today's TOC FU Call Status:: Unsuccessful Call (3rd Attempt) Unsuccessful Call (1st Attempt) Date: 05/10/23 Unsuccessful Call (2nd Attempt) Date: 05/12/23 Unsuccessful Call (3rd Attempt) Date: 05/13/23  Attempted to reach the patient regarding the most recent Inpatient/ED visit. Patient was called in an Outreach attempt to offer VBCI  30-day TOC program. Pt is eligible for program due to potential risk for readmission and/or high utilization. Unfortunately, I was not able to speak with the patient in regards to recent hospital discharge    Patient's voicemail has  a generic greeting. To maintain HIPAA compliance, left message with VBCI CM contact information only  and a request for a call back .      Based on the VBCI program guidelines, if  we are unable to reach the patient  after 3 attempts, no additional outreach attempts will be made and the TOC follow-up will be closed Unfortunately, we have been unable to make contact with the patient for follow up.   .   Follow Up Plan: No further outreach attempts will be made at this time. We have been unable to contact the patient.  Susa Loffler , BSN, RN [facility]   VBCI-Population Health RN Care Manager Direct Dial [phone]  Fax: [phone] Website: [email or web address]

## 2023-05-24 ENCOUNTER — Emergency Department (HOSPITAL_COMMUNITY): Payer: Medicare HMO

## 2023-05-24 ENCOUNTER — Encounter (HOSPITAL_COMMUNITY): Payer: Self-pay

## 2023-05-24 ENCOUNTER — Ambulatory Visit (INDEPENDENT_AMBULATORY_CARE_PROVIDER_SITE_OTHER): Payer: Medicare HMO | Admitting: Adult Health

## 2023-05-24 ENCOUNTER — Other Ambulatory Visit: Payer: Self-pay

## 2023-05-24 ENCOUNTER — Encounter: Payer: Self-pay | Admitting: Adult Health

## 2023-05-24 ENCOUNTER — Emergency Department (HOSPITAL_COMMUNITY)
Admission: EM | Admit: 2023-05-24 | Discharge: 2023-05-25 | Disposition: A | Discharge status: Home / Self Care | Payer: Medicare HMO | Attending: Emergency Medicine | Admitting: Emergency Medicine

## 2023-05-24 VITALS — BP 133/85 | HR 106 | Ht 70.0 in | Wt 206.0 lb

## 2023-05-24 DIAGNOSIS — R29810 Facial weakness: Secondary | ICD-10-CM

## 2023-05-24 DIAGNOSIS — R471 Dysarthria and anarthria: Secondary | ICD-10-CM | POA: Insufficient documentation

## 2023-05-24 DIAGNOSIS — R930 Abnormal findings on diagnostic imaging of skull and head, not elsewhere classified: Secondary | ICD-10-CM | POA: Diagnosis not present

## 2023-05-24 DIAGNOSIS — I10 Essential (primary) hypertension: Secondary | ICD-10-CM | POA: Diagnosis not present

## 2023-05-24 DIAGNOSIS — Z91040 Latex allergy status: Secondary | ICD-10-CM | POA: Insufficient documentation

## 2023-05-24 DIAGNOSIS — G4733 Obstructive sleep apnea (adult) (pediatric): Secondary | ICD-10-CM | POA: Diagnosis not present

## 2023-05-24 DIAGNOSIS — J45909 Unspecified asthma, uncomplicated: Secondary | ICD-10-CM | POA: Diagnosis not present

## 2023-05-24 DIAGNOSIS — Y900 Blood alcohol level of less than 20 mg/100 ml: Secondary | ICD-10-CM | POA: Diagnosis not present

## 2023-05-24 DIAGNOSIS — F444 Conversion disorder with motor symptom or deficit: Secondary | ICD-10-CM | POA: Insufficient documentation

## 2023-05-24 DIAGNOSIS — Z7952 Long term (current) use of systemic steroids: Secondary | ICD-10-CM | POA: Insufficient documentation

## 2023-05-24 DIAGNOSIS — Z79899 Other long term (current) drug therapy: Secondary | ICD-10-CM | POA: Insufficient documentation

## 2023-05-24 DIAGNOSIS — R2990 Unspecified symptoms and signs involving the nervous system: Secondary | ICD-10-CM | POA: Diagnosis not present

## 2023-05-24 DIAGNOSIS — Z7951 Long term (current) use of inhaled steroids: Secondary | ICD-10-CM | POA: Diagnosis not present

## 2023-05-24 DIAGNOSIS — R000 Tachycardia, unspecified: Secondary | ICD-10-CM | POA: Diagnosis not present

## 2023-05-24 DIAGNOSIS — R4781 Slurred speech: Secondary | ICD-10-CM | POA: Diagnosis not present

## 2023-05-24 LAB — COMPREHENSIVE METABOLIC PANEL
ALT: 36 U/L (ref 0–44)
AST: 25 U/L (ref 15–41)
Albumin: 3.9 g/dL (ref 3.5–5.0)
Alkaline Phosphatase: 62 U/L (ref 38–126)
Anion gap: 8 (ref 5–15)
BUN: 24 mg/dL — ABNORMAL HIGH (ref 6–20)
CO2: 21 mmol/L — ABNORMAL LOW (ref 22–32)
Calcium: 8.7 mg/dL — ABNORMAL LOW (ref 8.9–10.3)
Chloride: 110 mmol/L (ref 98–111)
Creatinine, Ser: 1.35 mg/dL — ABNORMAL HIGH (ref 0.61–1.24)
GFR, Estimated: >60 mL/min (ref >60)
Glucose, Bld: 117 mg/dL — ABNORMAL HIGH (ref 70–99)
Potassium: 3.7 mmol/L (ref 3.5–5.1)
Sodium: 139 mmol/L (ref 135–145)
Total Bilirubin: 0.4 mg/dL (ref 0.0–1.2)
Total Protein: 6.8 g/dL (ref 6.5–8.1)

## 2023-05-24 LAB — CBC
HCT: 42.1 % (ref 39.0–52.0)
Hemoglobin: 14.6 g/dL (ref 13.0–17.0)
MCH: 29.4 pg (ref 26.0–34.0)
MCHC: 34.7 g/dL (ref 30.0–36.0)
MCV: 84.7 fL (ref 80.0–100.0)
Platelets: 249 10*3/uL (ref 150–400)
RBC: 4.97 MIL/uL (ref 4.22–5.81)
RDW: 12.8 % (ref 11.5–15.5)
WBC: 5.8 10*3/uL (ref 4.0–10.5)
nRBC: 0 % (ref 0.0–0.2)

## 2023-05-24 LAB — RAPID URINE DRUG SCREEN, HOSP PERFORMED
Amphetamines: NOT DETECTED
Barbiturates: NOT DETECTED
Benzodiazepines: NOT DETECTED
Cocaine: NOT DETECTED
Opiates: NOT DETECTED
Tetrahydrocannabinol: NOT DETECTED

## 2023-05-24 LAB — URINALYSIS, ROUTINE W REFLEX MICROSCOPIC
Bilirubin Urine: NEGATIVE
Glucose, UA: NEGATIVE mg/dL
Hgb urine dipstick: NEGATIVE
Ketones, ur: NEGATIVE mg/dL
Leukocytes,Ua: NEGATIVE
Nitrite: NEGATIVE
Protein, ur: NEGATIVE mg/dL
Specific Gravity, Urine: 1.017 (ref 1.005–1.030)
pH: 5 (ref 5.0–8.0)

## 2023-05-24 LAB — DIFFERENTIAL
Abs Immature Granulocytes: 0.02 10*3/uL (ref 0.00–0.07)
Basophils Absolute: 0 10*3/uL (ref 0.0–0.1)
Basophils Relative: 1 %
Eosinophils Absolute: 0.1 10*3/uL (ref 0.0–0.5)
Eosinophils Relative: 2 %
Immature Granulocytes: 0 %
Lymphocytes Relative: 14 %
Lymphs Abs: 0.8 10*3/uL (ref 0.7–4.0)
Monocytes Absolute: 0.6 10*3/uL (ref 0.1–1.0)
Monocytes Relative: 11 %
Neutro Abs: 4.2 10*3/uL (ref 1.7–7.7)
Neutrophils Relative %: 72 %

## 2023-05-24 LAB — ETHANOL: Alcohol, Ethyl (B): <10 mg/dL (ref <10)

## 2023-05-24 LAB — PROTIME-INR
INR: 1.1 (ref 0.8–1.2)
Prothrombin Time: 14.2 s (ref 11.4–15.2)

## 2023-05-24 MED ORDER — LORAZEPAM 2 MG/ML IJ SOLN
1.0000 mg | Freq: Once | INTRAMUSCULAR | Status: AC
  Administered 2023-05-24: 1 mg via INTRAVENOUS
  Filled 2023-05-24: qty 1

## 2023-05-24 NOTE — Discharge Instructions (Addendum)
 Fortunately, your test results today are reassuring.  Your recent symptoms may be secondary to stress and anxiety.  There are telephone numbers below that you can call to establish care with an outpatient psychiatrist.  Continue your home medications as prescribed.  Return to the emergency department for any new or worsening symptoms of concern.

## 2023-05-24 NOTE — ED Triage Notes (Signed)
 Pt bib ems from doctor office ([facility]) c/o stroke like symptoms. LKW 2 weeks ago post surgery. Right side facial droop and stutter (started 05/24/2023)   BP 150/105 HR 120 O2 98 CBG 140

## 2023-05-24 NOTE — ED Provider Notes (Signed)
 [place] [facility] AT [facility] Provider Note   CSN: [id number] Arrival date & time: 05/24/23  1556     History  Chief Complaint  Patient presents with   Facial Droop   Aphasia    Zachary Nash is a 53 y.o. male.  HPI Patient presents for strokelike symptoms.  Medical history includes esophageal motility disorder, sleep apnea, GERD, asthma, anxiety, arthritis.  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  Shortly thereafter, he developed word finding difficulty, right-sided facial droop, right-sided weakness, stuttering and slurred speech.  He was seen at neurology office earlier today.  He was advised to come to the ED for stroke workup.  Per chart review, he is not on antiplatelet or anticoagulation medication.  Patient confirms this.    Home Medications Prior to Admission medications   Medication Sig Start Date End Date Taking? Authorizing Provider  albuterol (VENTOLIN HFA) 108 (90 Base) MCG/ACT inhaler Inhale 2 puffs into the lungs every 4 (four) hours as needed for wheezing or shortness of breath. 01/15/23   Particia Nearing, PA-C  cetirizine (ZYRTEC ALLERGY) 10 MG tablet Take 1 tablet (10 mg total) by mouth daily. 04/28/20   Avegno, Zachery Dakins, FNP  DULoxetine (CYMBALTA) 20 MG capsule Take 20 mg by mouth at bedtime.  02/03/18   [provider]  pantoprazole (PROTONIX) 40 MG tablet Take 1 tablet (40 mg total) by mouth 2 (two) times daily before a meal. 05/03/19   Ardelia Mems, PA-C  predniSONE (DELTASONE) 20 MG tablet Take 2 tablets (40 mg total) by mouth daily with breakfast. 01/15/23   Particia Nearing, PA-C  promethazine-dextromethorphan (PROMETHAZINE-DM) 6.25-15 MG/5ML syrup Take 5 mLs by mouth 4 (four) times daily as needed. 01/15/23   Particia Nearing, PA-C      Allergies    Dilaudid [hydromorphone hcl], Lidocaine, Penicillins, Latex, and Contrast media  [iodinated contrast media]    Review of Systems   Review of Systems  Neurological:  Positive for facial asymmetry, speech difficulty and weakness.  All other systems reviewed and are negative.   Physical Exam Updated Vital Signs BP (!) 159/117   Pulse (!) 108   Temp 98.7 F (37.1 C)   Resp 19   Ht 5\' 10"  (1.778 m)   Wt 93.4 kg   SpO2 99%   BMI 29.56 kg/m  Physical Exam Vitals and nursing note reviewed.  Constitutional:      General: He is not in acute distress.    Appearance: He is well-developed. He is not toxic-appearing or diaphoretic.  HENT:     Head: Normocephalic and atraumatic.     Right Ear: External ear normal.     Left Ear: External ear normal.     Nose: Nose normal.     Mouth/Throat:     Mouth: Mucous membranes are moist.  Eyes:     Extraocular Movements: Extraocular movements intact.     Conjunctiva/sclera: Conjunctivae normal.  Cardiovascular:     Rate and Rhythm: Regular rhythm. Tachycardia present.     Heart sounds: No murmur heard. Pulmonary:     Effort: Pulmonary effort is normal. No respiratory distress.  Abdominal:     General: There is no distension.     Palpations: Abdomen is soft.  Musculoskeletal:        General: No swelling. Normal range of motion.     Cervical back: Normal range of motion and neck supple.  Skin:  General: Skin is warm and dry.     Coloration: Skin is not jaundiced or pale.  Neurological:     Mental Status: He is alert.     Comments: Right-sided facial droop with forehead sparing, diminished sensation on right hemibody, mild weakness on right upper and lower extremities, stuttering and word finding difficulty present.  Psychiatric:        Mood and Affect: Mood normal.        Behavior: Behavior normal.     ED Results / Procedures / Treatments   Labs (all labs ordered are listed, but only abnormal results are displayed) Labs Reviewed  COMPREHENSIVE METABOLIC PANEL - Abnormal; Notable for the following components:       Result Value   CO2 21 (*)    Glucose, Bld 117 (*)    BUN 24 (*)    Creatinine, Ser 1.35 (*)    Calcium 8.7 (*)    All other components within normal limits  PROTIME-INR  CBC  DIFFERENTIAL  ETHANOL  RAPID URINE DRUG SCREEN, HOSP PERFORMED  URINALYSIS, ROUTINE W REFLEX MICROSCOPIC    EKG EKG Interpretation Date/Time:  Monday May 24 2023 16:03:58 EST Ventricular Rate:  122 PR Interval:  166 QRS Duration:  74 QT Interval:  298 QTC Calculation: 424 R Axis:   27  Text Interpretation: Sinus tachycardia Confirmed by Gloris Manchester ([id number]) on 05/24/2023 9:02:40 PM  Radiology MR BRAIN WO CONTRAST Result Date: 05/24/2023 CLINICAL DATA:  Slurred speech and facial droop. EXAM: MRI HEAD WITHOUT CONTRAST TECHNIQUE: Multiplanar, multiecho pulse sequences of the brain and surrounding structures were obtained without intravenous contrast. COMPARISON:  CT head without contrast 05/24/2023. MR head without contrast 05/25/2017. FINDINGS: Brain: No acute infarct, hemorrhage, or mass lesion is present. No significant white matter lesions are present. The ventricles are of normal size. Deep brain nuclei are within normal limits. No significant extraaxial fluid collection is present. The brainstem and cerebellum are within normal limits. Enlarged empty sella is present. Midline structures are otherwise within normal limits. Vascular: Flow is present in the major intracranial arteries. Skull and upper cervical spine: Mild degenerative disc disease present at C3-4. Craniocervical junction is normal. Marrow signal is within normal limits. Sinuses/Orbits: The paranasal sinuses and mastoid air cells are clear. The globes and orbits are within normal limits. Other: IMPRESSION: 1. Negative MRI of the brain. No acute or focal lesion to explain the patient's symptoms. 2. Enlarged empty sella. This is a nonspecific finding but can be seen in the setting of idiopathic intracranial hypertension. Electronically Signed    By: Marin Roberts M.D.   On: 05/24/2023 23:01   CT HEAD WO CONTRAST Result Date: 05/24/2023 CLINICAL DATA:  Slurred speech, facial droop EXAM: CT HEAD WITHOUT CONTRAST TECHNIQUE: Contiguous axial images were obtained from the base of the skull through the vertex without intravenous contrast. RADIATION DOSE REDUCTION: This exam was performed according to the departmental dose-optimization program which includes automated exposure control, adjustment of the mA and/or kV according to patient size and/or use of iterative reconstruction technique. COMPARISON:  None Available. FINDINGS: Brain: No evidence of acute infarction, hemorrhage, hydrocephalus, extra-axial collection or mass lesion/mass effect. Vascular: No hyperdense vessel or unexpected calcification. Skull: Normal. Negative for fracture or focal lesion. Sinuses/Orbits: The visualized paranasal sinuses are essentially clear. The mastoid air cells are unopacified. Other: None. IMPRESSION: Normal head CT. Electronically Signed   By: Charline Bills M.D.   On: 05/24/2023 21:05    Procedures Procedures    Medications  Ordered in ED Medications  LORazepam (ATIVAN) injection 1 mg (1 mg Intravenous Given 05/24/23 2104)    ED Course/ Medical Decision Making/ A&P                                 Medical Decision Making Amount and/or Complexity of Data Reviewed Labs: ordered. Radiology: ordered.  Risk Prescription drug management.   This patient presents to the ED for concern of strokelike symptoms, this involves an extensive number of treatment options, and is a complaint that carries with it a high risk of complications and morbidity.  The differential diagnosis includes CVA, neoplasm, ICH, functional neurologic disorder   Co morbidities that complicate the patient evaluation  esophageal motility disorder, sleep apnea, GERD, asthma, anxiety, arthritis   Additional history obtained:  Additional history obtained from  N/A External records from outside source obtained and reviewed including EMR   Lab Tests:  I Ordered, and personally interpreted labs.  The pertinent results include: Baseline creatinine, normal electrolytes, normal hemoglobin, no leukocytosis   Imaging Studies ordered:  I ordered imaging studies including CT head, MRI brain I independently visualized and interpreted imaging which showed no acute findings I agree with the radiologist interpretation   Cardiac Monitoring: / EKG:  The patient was maintained on a cardiac monitor.  I personally viewed and interpreted the cardiac monitored which showed an underlying rhythm of: Sinus rhythm   Consultations Obtained:  I requested consultation with the neurologist, Dr. Derry Lory,  and discussed lab and imaging findings as well as pertinent plan - they recommend: Symptoms consistent with functional neurologic disorder.  Patient cleared from neurologic standpoint.   Problem List / ED Course / Critical interventions / Medication management  Patient presents for strokelike symptoms.  These have been ongoing for the past 2 weeks.  He has no known history of CVA.  On arrival, he has right-sided facial droop with forehead sparing, right hemibody weakness and diminished sensation.  Patient's lab work is unremarkable.  EKG does show some nonspecific ST segment changes.  Will check troponin.  Given his symptomatology, neurology was consulted.  I spoke with neurologist on-call, Dr. Derry Lory, who agrees with MRI.  Neurology evaluated the patient in the ED.  When pressed on neuroexam, he does have resolution of his stutter and word finding difficulty.  MRI is negative for acute findings.  This does appear to be functional neurologic disorder.  Patient does endorse recent increased stress.  He is agreeable to psychiatric outpatient follow-up.  He is agreeable to discharge at this time.  He was discharged in stable condition. 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

## 2023-05-24 NOTE — ED Notes (Signed)
 Pt surgeon recommended pt to be evaluated d/t the nature of procedure done on 05/07/2023

## 2023-05-24 NOTE — Progress Notes (Signed)
 PATIENT: Zachary Nash DOB: 11/01/1970  REASON FOR VISIT: follow up HISTORY FROM: patient PRIMARY NEUROLOGIST: Dr.Dohmeier  Chief Complaint  Patient presents with   Follow-up    Patient in room #19 and alone.  Patient states he is well and stable with no new concerns. Patient beleive it time for a new CPAP machine and its not running right.     HISTORY OF PRESENT ILLNESS: Today 05/24/23:  [name] is a 53 y.o. male with a history of OSA on CPAP . Returns today for follow-up.  Reports that the CPAP is working well.  He denies any new issues.  He does report that he had a styloidectomy on February 7.  He states that since then he has been having stuttering speech and a right facial droop.  Procedure was done on the left side.  I read through the procedure note when he was discharged from the hospital it was reported that he had "strong speech."  Patient clarifies that his surgeon is not aware that he has a right facial droop and stuttering speech.  This happened once he got home from the hospital.  I am concerned that he may have had a stroke postprocedure.  We called his surgeon spoke to the RN in triage.  I explained his symptoms and explained symptoms were not acute.  Nurse asked that we send the patient to the ED.  I asked that she consult with one of the on-call physicians.  She did consult and they too asked that we send him to the ED for complete workup.  Patient is amenable. EMS called. Spoke with patients mother in law and wife over the phone.          REVIEW OF SYSTEMS: Out of a complete 14 system review of symptoms, the patient complains only of the following symptoms, and all other reviewed systems are negative.  ALLERGIES: Allergies  Allergen Reactions   Dilaudid [Hydromorphone Hcl] Anaphylaxis   Lidocaine Other (See Comments)    SYNCOPE "passed out"   Penicillins Anaphylaxis, Swelling and Other (See Comments)    Has patient had a PCN reaction causing  immediate rash, facial/tongue/throat swelling, SOB or lightheadedness with hypotension: yes Has patient had a PCN reaction causing severe rash involving mucus membranes or skin necrosis: yes Has patient had a PCN reaction that required hospitalization: no Has patient had a PCN reaction occurring within the last 10 years: no If all of the above answers are "NO", then may proceed with Cephalosporin use.   Latex    Contrast Media [Iodinated Contrast Media] Palpitations    HOME MEDICATIONS: Outpatient Medications Prior to Visit  Medication Sig Dispense Refill   albuterol (VENTOLIN HFA) 108 (90 Base) MCG/ACT inhaler Inhale 2 puffs into the lungs every 4 (four) hours as needed for wheezing or shortness of breath. 18 g 0   cetirizine (ZYRTEC ALLERGY) 10 MG tablet Take 1 tablet (10 mg total) by mouth daily. 30 tablet 0   DULoxetine (CYMBALTA) 20 MG capsule Take 20 mg by mouth at bedtime.   2   pantoprazole (PROTONIX) 40 MG tablet Take 1 tablet (40 mg total) by mouth 2 (two) times daily before a meal. 180 tablet 0   predniSONE (DELTASONE) 20 MG tablet Take 2 tablets (40 mg total) by mouth daily with breakfast. 10 tablet 0   promethazine-dextromethorphan (PROMETHAZINE-DM) 6.25-15 MG/5ML syrup Take 5 mLs by mouth 4 (four) times daily as needed. 100 mL 0   No facility-administered medications  prior to visit.    PAST MEDICAL HISTORY: Past Medical History:  Diagnosis Date   Anxiety    Arthritis    lower back, hands   Asthma    Bilateral inguinal hernia 05/29/2016   surgery to repair   Constipation    DDD (degenerative disc disease), lumbar    Fatty liver    GERD (gastroesophageal reflux disease)    History of hiatal hernia    History of kidney stones 2006   passed stone, no surgery   Pneumonia    PONV (postoperative nausea and vomiting)    pt also states his O2 drops after surgery   Radicular pain of left lower extremity    Sleep apnea    patient uses 2 L oxygen night - no cpap, 07/06/19  on CPAP    PAST SURGICAL HISTORY: Past Surgical History:  Procedure Laterality Date   ANTERIOR CERVICAL DECOMP/DISCECTOMY FUSION N/A 05/26/2017   Procedure: ANTERIOR CERVICAL DECOMPRESSION/DISCECTOMY FUSION TWO LEVELS Cervical five-six, Cervical six-seven;  Surgeon: Estill Bamberg, MD;  [facility];  Service: Orthopedics;  Laterality: N/A;   BIOPSY  04/11/2018   Procedure: BIOPSY;  Surgeon: Kathi Der, MD;  [facility];  Service: Gastroenterology;;   CHOLECYSTECTOMY     ESOPHAGEAL DILATION N/A 03/24/2017   Procedure: ESOPHAGEAL DILATION;  Surgeon: Malissa Hippo, MD;  [facility];  Service: Endoscopy;  Laterality: N/A;   ESOPHAGEAL MANOMETRY N/A 04/11/2018   Procedure: ESOPHAGEAL MANOMETRY (EM);  Surgeon: Kathi Der, MD;  [facility];  Service: Gastroenterology;  Laterality: N/A;   ESOPHAGOGASTRODUODENOSCOPY N/A 03/24/2017   Procedure: ESOPHAGOGASTRODUODENOSCOPY (EGD);  Surgeon: Malissa Hippo, MD;  [facility];  Service: Endoscopy;  Laterality: N/A;  1155   ESOPHAGOGASTRODUODENOSCOPY (EGD) WITH PROPOFOL N/A 04/11/2018   Procedure: ESOPHAGOGASTRODUODENOSCOPY (EGD) WITH PROPOFOL;  Surgeon: Kathi Der, MD;  [facility];  Service: Gastroenterology;  Laterality: N/A;  mano probe to be placed during EGD   INGUINAL HERNIA REPAIR Bilateral 05/29/2016   Procedure: OPEN HERNIA REPAIR INGUINAL ADULT BILATERAL WITH insertion of MESH;  Surgeon: Claud Kelp, MD;  [facility];  Service: General;  Laterality: Bilateral;   Lipoma removed     RADIOLOGY WITH ANESTHESIA N/A 06/14/2018   Procedure: MRI LUMBER SPINE WITHOUT CONTRAST;  Surgeon: Radiologist, Medication, MD;  Location: [facility];  Service: Radiology;  Laterality: N/A;   WISDOM TOOTH EXTRACTION      FAMILY HISTORY: Family History  Problem Relation Age of Onset   Heart failure Father    Heart attack Father    Ovarian cancer Mother    Arthritis Mother    Anxiety  disorder Mother     SOCIAL HISTORY: Social History   Socioeconomic History   Marital status: Married    Spouse name: Not on file   Number of children: Not on file   Years of education: Not on file   Highest education level: Not on file  Occupational History   Not on file  Tobacco Use   Smoking status: Never   Smokeless tobacco: Never  Vaping Use   Vaping status: Never Used  Substance and Sexual Activity   Alcohol use: No   Drug use: No   Sexual activity: Not on file  Other Topics Concern   Not on file  Social History Narrative   Not on file   Social Drivers of Health   Financial Resource Strain: Not on file  Food Insecurity: Not on file  Transportation Needs: No Transportation Needs (  05/08/2023)   Received from [facility]   Transportation    In the past 12 months, has lack of reliable transportation kept you from medical appointments, meetings, work or from getting things needed for daily living? : No  Physical Activity: Not on file  Stress: Not on file  Social Connections: Unknown (08/11/2021)   Received from [facility], [facility]   Social Network    Social Network: Not on file  Intimate Partner Violence: Unknown (07/03/2021)   Received from [facility], [facility]   HITS    Physically Hurt: Not on file    Insult or Talk Down To: Not on file    Threaten Physical Harm: Not on file    Scream or Curse: Not on file      PHYSICAL EXAM  Vitals:   05/24/23 1325  BP: 133/85  Pulse: (!) 106  Weight: 206 lb (93.4 kg)  Height: 5\' 10"  (1.778 m)   Body mass index is 29.56 kg/m.  Generalized: Well developed, in no acute distress  Chest: Lungs clear to auscultation bilaterally  Neurological examination  Mentation: Alert oriented to time, place, history taking. Follows all commands. Intermittently stuttering speech  Cranial nerve II-XII: Extraocular movements were full, visual field were full on confrontational test Head turning and shoulder shrug   were normal and symmetric. Right facial droop. Motor: The motor testing reveals 5 over 5 strength of all 4 extremities. Good symmetric motor tone is noted throughout.  Sensory: Sensory testing is intact to soft touch on all 4 extremities. No evidence of extinction is noted.  Gait and station: Gait is normal.    DIAGNOSTIC DATA (LABS, IMAGING, TESTING) - I reviewed patient records, labs, notes, testing and imaging myself where available.  Lab Results  Component Value Date   WBC 8.7 05/02/2020   HGB 15.5 05/02/2020   HCT 46.1 05/02/2020   MCV 86.0 05/02/2020   PLT 289 05/02/2020      Component Value Date/Time   NA 137 05/02/2020 1351   K 4.0 05/02/2020 1351   CL 105 05/02/2020 1351   CO2 24 05/02/2020 1351   GLUCOSE 118 (H) 05/02/2020 1351   BUN 21 (H) 05/02/2020 1351   CREATININE 1.30 (H) 05/02/2020 1351   CALCIUM 9.2 05/02/2020 1351   PROT 6.9 05/24/2017 1740   ALBUMIN 4.1 05/24/2017 1740   AST 33 05/24/2017 1740   ALT 39 05/24/2017 1740   ALKPHOS 65 05/24/2017 1740   BILITOT 0.6 05/24/2017 1740   GFRNONAA >60 05/02/2020 1351   GFRAA >60 03/22/2019 2310    Lab Results  Component Value Date   VITAMINB12 720 07/06/2018   Lab Results  Component Value Date   TSH 1.84 03/09/2018      ASSESSMENT AND PLAN 53 y.o. year old male  has a past medical history of Anxiety, Arthritis, Asthma, Bilateral inguinal hernia (05/29/2016), Constipation, DDD (degenerative disc disease), lumbar, Fatty liver, GERD (gastroesophageal reflux disease), History of hiatal hernia, History of kidney stones (2006), Pneumonia, PONV (postoperative nausea and vomiting), Radicular pain of left lower extremity, and Sleep apnea. here with:  OSA on CPAP  - CPAP compliance excellent - Good treatment of AHI  - Encourage patient to use CPAP nightly and > 4 hours each night  2.  Right facial droop with stuttering speech  -Suspect potentially postprocedural stroke? -Patient called his surgeon,I spoke to  triage RN who spoke to the on-call physician.  They would like the patient sent to the ED for evaluation. -  FU in 6 months or sooner if needed.     Butch Penny, MSN, NP-C 05/24/2023, 1:38 PM [facility] [address], [address] [place], [place] [address] [phone]

## 2023-05-24 NOTE — ED Provider Triage Note (Signed)
 Emergency Medicine Provider Triage Evaluation Note  Zachary Nash , a 53 y.o. male  was evaluated in triage.  Pt complains of stuttering speech, right-sided facial droop.  Reports that his symptoms began on February 7 after having a left-sided styloidectomy.  Reports that he returned home that evening and began having facial droop as well as stuttering and slurred speech.  Patient was seen at neurology office today where the symptoms were noted and he was sent to the ED for further management out of concern for CVA.  The patient states that his symptoms been persistent since 7 February.  Today is February 24.  He reports that he did not tell a surgeon about these events.  Denies any subjective numbness.  Denies weakness on one side of his body.  Endorsing slight headache, neck pain and nausea with vomiting.  Denies any blurred vision, ataxia.  Review of Systems  Positive:  Negative:   Physical Exam  BP (!) 151/104 (BP Location: Right Arm)   Pulse (!) 120   Temp 99.8 F (37.7 C) (Oral)   Resp 18   Ht 5\' 10"  (1.778 m)   Wt 93.4 kg   SpO2 98%   BMI 29.56 kg/m  Gen:   Awake, no distress   Resp:  Normal effort  MSK:   Moves extremities without difficulty  Other:  Right-sided facial droop present.  No dysmetria, no aphasia.  No subjective numbness.  Slightly reduced grip strength 4 out of 5 to right upper extremity, 3 out of 5 strength right lower extremity.  Medical Decision Making  Medically screening exam initiated at 5:46 PM.  Appropriate orders placed.  Jannifer Franklin was informed that the remainder of the evaluation will be completed by another provider, this initial triage assessment does not replace that evaluation, and the importance of remaining in the ED until their evaluation is complete.     Al Decant, PA-C 05/24/23 [phone]

## 2023-05-25 NOTE — Consult Note (Signed)
 NEUROLOGY CONSULT NOTE   Date of service: May 25, 2023 Patient Name: Zachary Nash MRN:  [id number] DOB:  [birth year] Chief Complaint: "R facial droop, stuttering speech" Requesting Provider: No att. providers found  History of Present Illness  Zachary Nash is a 53 y.o. male with hx of asthma, GERD, fatty liver, OSA, eagle syndrome s/p recent L styloidectomy who presents with 2-3 weeks hx of stuttering speech and R facial droop that is intermittent.  Had left styloidectomy on 05/07/23. Upon awakening, felt speech was stuttering. This has been intermittently occurring. This got specially worse today. Also has a slight R facial asymmetry.  He was seen in neurology clinic and this was discussed with his surgery team and he was directed to the ED for further evaluation and workup for a possible stroke.  LKW: 05/07/23 Modified rankin score: 0-Completely asymptomatic and back to baseline post- stroke IV Thrombolysis: not offered, felt less likely to be stroke. EVT: not offered, felt less likely to be stroke.  NIHSS components Score: Comment  1a Level of Conscious 0[x]  1[]  2[]  3[]      1b LOC Questions 0[x]  1[]  2[]       1c LOC Commands 0[x]  1[]  2[]       2 Best Gaze 0[x]  1[]  2[]       3 Visual 0[x]  1[]  2[]  3[]      4 Facial Palsy 0[x]  1[]  2[]  3[]      5a Motor Arm - left 0[x]  1[]  2[]  3[]  4[]  UN[]    5b Motor Arm - Right 0[x]  1[]  2[]  3[]  4[]  UN[]    6a Motor Leg - Left 0[x]  1[]  2[]  3[]  4[]  UN[]    6b Motor Leg - Right 0[x]  1[]  2[]  3[]  4[]  UN[]    7 Limb Ataxia 0[x]  1[]  2[]  3[]  UN[]     8 Sensory 0[x]  1[]  2[]  UN[]      9 Best Language 0[x]  1[]  2[]  3[]      10 Dysarthria 0[x]  1[]  2[]  UN[]      11 Extinct. and Inattention 0[x]  1[]  2[]       TOTAL: 0      ROS  Comprehensive ROS performed and pertinent positives documented in HPI   Past History   Past Medical History:  Diagnosis Date   Anxiety    Arthritis    lower back, hands   Asthma    Bilateral inguinal hernia 05/29/2016   surgery to  repair   Constipation    DDD (degenerative disc disease), lumbar    Fatty liver    GERD (gastroesophageal reflux disease)    History of hiatal hernia    History of kidney stones 2006   passed stone, no surgery   Pneumonia    PONV (postoperative nausea and vomiting)    pt also states his O2 drops after surgery   Radicular pain of left lower extremity    Sleep apnea    patient uses 2 L oxygen night - no cpap, 07/06/19 on CPAP    Past Surgical History:  Procedure Laterality Date   ANTERIOR CERVICAL DECOMP/DISCECTOMY FUSION N/A 05/26/2017   Procedure: ANTERIOR CERVICAL DECOMPRESSION/DISCECTOMY FUSION TWO LEVELS Cervical five-six, Cervical six-seven;  Surgeon: Estill Bamberg, MD;  [facility];  Service: Orthopedics;  Laterality: N/A;   BIOPSY  04/11/2018   Procedure: BIOPSY;  Surgeon: Kathi Der, MD;  [facility];  Service: Gastroenterology;;   CHOLECYSTECTOMY     ESOPHAGEAL DILATION N/A 03/24/2017   Procedure: ESOPHAGEAL DILATION;  Surgeon: Malissa Hippo, MD;  [facility];  Service: Endoscopy;  Laterality: N/A;   ESOPHAGEAL MANOMETRY N/A 04/11/2018   Procedure: ESOPHAGEAL MANOMETRY (EM);  Surgeon: Kathi Der, MD;  [facility];  Service: Gastroenterology;  Laterality: N/A;   ESOPHAGOGASTRODUODENOSCOPY N/A 03/24/2017   Procedure: ESOPHAGOGASTRODUODENOSCOPY (EGD);  Surgeon: Malissa Hippo, MD;  [facility];  Service: Endoscopy;  Laterality: N/A;  1155   ESOPHAGOGASTRODUODENOSCOPY (EGD) WITH PROPOFOL N/A 04/11/2018   Procedure: ESOPHAGOGASTRODUODENOSCOPY (EGD) WITH PROPOFOL;  Surgeon: Kathi Der, MD;  [facility];  Service: Gastroenterology;  Laterality: N/A;  mano probe to be placed during EGD   INGUINAL HERNIA REPAIR Bilateral 05/29/2016   Procedure: OPEN HERNIA REPAIR INGUINAL ADULT BILATERAL WITH insertion of MESH;  Surgeon: Claud Kelp, MD;  [facility]: [facility];  Service: General;  Laterality: Bilateral;    Lipoma removed     RADIOLOGY WITH ANESTHESIA N/A 06/14/2018   Procedure: MRI LUMBER SPINE WITHOUT CONTRAST;  Surgeon: Radiologist, Medication, MD;  Location: [facility];  Service: Radiology;  Laterality: N/A;   WISDOM TOOTH EXTRACTION      Family History: Family History  Problem Relation Age of Onset   Heart failure Father    Heart attack Father    Ovarian cancer Mother    Arthritis Mother    Anxiety disorder Mother     Social History  reports that he has never smoked. He has never used smokeless tobacco. He reports that he does not drink alcohol and does not use drugs.  Allergies  Allergen Reactions   Dilaudid [Hydromorphone Hcl] Anaphylaxis   Lidocaine Other (See Comments)    SYNCOPE "passed out"   Penicillins Anaphylaxis, Swelling and Other (See Comments)    Has patient had a PCN reaction causing immediate rash, facial/tongue/throat swelling, SOB or lightheadedness with hypotension: yes Has patient had a PCN reaction causing severe rash involving mucus membranes or skin necrosis: yes Has patient had a PCN reaction that required hospitalization: no Has patient had a PCN reaction occurring within the last 10 years: no If all of the above answers are "NO", then may proceed with Cephalosporin use.   Latex    Contrast Media [Iodinated Contrast Media] Palpitations    Medications  No current facility-administered medications for this encounter.  Current Outpatient Medications:    albuterol (VENTOLIN HFA) 108 (90 Base) MCG/ACT inhaler, Inhale 2 puffs into the lungs every 4 (four) hours as needed for wheezing or shortness of breath., Disp: 18 g, Rfl: 0   cetirizine (ZYRTEC ALLERGY) 10 MG tablet, Take 1 tablet (10 mg total) by mouth daily., Disp: 30 tablet, Rfl: 0   DULoxetine (CYMBALTA) 20 MG capsule, Take 20 mg by mouth at bedtime. , Disp: , Rfl: 2   pantoprazole (PROTONIX) 40 MG tablet, Take 1 tablet (40 mg total) by mouth 2 (two) times daily before a meal., Disp: 180 tablet, Rfl: 0    predniSONE (DELTASONE) 20 MG tablet, Take 2 tablets (40 mg total) by mouth daily with breakfast., Disp: 10 tablet, Rfl: 0   promethazine-dextromethorphan (PROMETHAZINE-DM) 6.25-15 MG/5ML syrup, Take 5 mLs by mouth 4 (four) times daily as needed., Disp: 100 mL, Rfl: 0  Vitals   Vitals:   05/24/23 2245 05/24/23 2300 05/24/23 2315 05/24/23 2330  BP: (!) 144/103  (!) 147/105 (!) 132/97  Pulse: 99 (!) 105 98 87  Resp: (!) 21 (!) 26 14 20   Temp:      TempSrc:      SpO2: 97% 100% 100% 99%  Weight:      Height:  Body mass index is 29.56 kg/m.  Physical Exam   General: Laying comfortably in bed; in no acute distress.  HENT: Normal oropharynx and mucosa. Normal external appearance of ears and nose.  Neck: Supple, no pain or tenderness  CV: No JVD. No peripheral edema.  Pulmonary: Symmetric Chest rise. Normal respiratory effort.  Abdomen: Soft to touch, non-tender.  Ext: No cyanosis, edema, or deformity  Skin: No rash. Normal palpation of skin.   Musculoskeletal: Normal digits and nails by inspection. No clubbing.   Neurologic Examination  Mental status/Cognition: Alert, oriented to self, place, month and year, good attention.  Speech/language: Fluent, comprehension intact, object naming intact, repetition intact. Stuttering speech that resolves with distraction. Cranial nerves:   CN II Pupils equal and reactive to light, no VF deficits    CN III,IV,VI EOM intact, no gaze preference or deviation, no nystagmus    CN V normal sensation in V1, V2, and V3 segments bilaterally    CN VII R nasolabial flattening that resolves as he is distracted.   CN VIII normal hearing to speech    CN IX & X normal palatal elevation, no uvular deviation    CN XI 5/5 head turn and 5/5 shoulder shrug bilaterally    CN XII midline tongue protrusion    Motor:  Muscle bulk: normal, tone normal, pronator drift none tremor none Mvmt Root Nerve  Muscle Right Left Comments  SA C5/6 Ax Deltoid 5 5   EF  C5/6 Mc Biceps 5 5   EE C6/7/8 Rad Triceps 5 5   WF C6/7 Med FCR     WE C7/8 PIN ECU     F Ab C8/T1 U ADM/FDI 5 5   HF L1/2/3 Fem Illopsoas 5 5   KE L2/3/4 Fem Quad 5 5   DF L4/5 D Peron Tib Ant 5 5   PF S1/2 Tibial Grc/Sol 5 5    Sensation:  Light touch Intact throughout   Pin prick    Temperature    Vibration   Proprioception    Coordination/Complex Motor:  - Finger to Nose intact BL - Heel to shin intact BL - Rapid alternating movement are normal - Gait: deferred.  Labs/Imaging/Neurodiagnostic studies   CBC:  Recent Labs  Lab May 25, 2023 1756  WBC 5.8  NEUTROABS 4.2  HGB 14.6  HCT 42.1  MCV 84.7  PLT 249   Basic Metabolic Panel:  Lab Results  Component Value Date   NA 139 25-May-2023   K 3.7 25-May-2023   CO2 21 (L) 25-May-2023   GLUCOSE 117 (H) May 25, 2023   BUN 24 (H) 2023/05/25   CREATININE 1.35 (H) 05-25-23   CALCIUM 8.7 (L) 05-25-2023   GFRNONAA >60 2023/05/25   GFRAA >60 03/22/2019   Lipid Panel: No results found for: "LDLCALC" HgbA1c: No results found for: "HGBA1C" Urine Drug Screen:     Component Value Date/Time   LABOPIA NONE DETECTED 05/25/23 1730   COCAINSCRNUR NONE DETECTED May 25, 2023 1730   LABBENZ NONE DETECTED 2023/05/25 1730   AMPHETMU NONE DETECTED 05/25/23 1730   THCU NONE DETECTED 05-25-2023 1730   LABBARB NONE DETECTED 05/25/2023 1730    Alcohol Level     Component Value Date/Time   ETH <10 05-25-2023 1756   INR  Lab Results  Component Value Date   INR 1.1 May 25, 2023   APTT No results found for: "APTT" AED levels: No results found for: "PHENYTOIN", "ZONISAMIDE", "LAMOTRIGINE", "LEVETIRACETA"  CT Head without contrast(Personally reviewed): CTH was negative for a large hypodensity  concerning for a large territory infarct or hyperdensity concerning for an ICH  MRI Brain(Personally reviewed): No acute stroke.  ASSESSMENT   Zachary Nash is a 53 y.o. male with hx of asthma, GERD, fatty liver, OSA, eagle syndrome s/p  recent L styloidectomy who presents with 2-3 weeks hx of stuttering speech and R facial droop that is intermittent.  Neuro exam with noted resting R facial droop and stuttering speech without true aphasia or word finding difficulty. However, as he is distracted during exam, the stutter goes away completely with symmetric facial movement.  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  I spoke with patient, his father and his wife in detail about underlying stressors. 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  Facial nerve does exit the skull at the stylomastoid foramne and is in close proximity to the styloid process. However, I dont think his L styloidectomy would explain his R facial asymmetry.  RECOMMENDATIONS  - continue outpatient neurology follow up. - Follow up with Psychotherapy to assist with better coping with stressors. Patient agreeable to trying this. May not be easy to find one close to [place]. ______________________________________________________________________    Signed, Erick Blinks, MD [facility]

## 2023-05-25 NOTE — ED Notes (Signed)
 Patient d/c home with orders. Patient is to follow up with pcp as directed. Patient given strict return precautions to return to the ER if worsen.

## 2023-05-26 DIAGNOSIS — M2420 Disorder of ligament, unspecified site: Secondary | ICD-10-CM | POA: Diagnosis not present

## 2023-08-03 DIAGNOSIS — Z0100 Encounter for examination of eyes and vision without abnormal findings: Secondary | ICD-10-CM | POA: Diagnosis not present

## 2023-08-30 ENCOUNTER — Encounter: Payer: Self-pay | Admitting: Adult Health

## 2023-09-01 DIAGNOSIS — M2420 Disorder of ligament, unspecified site: Secondary | ICD-10-CM | POA: Diagnosis not present

## 2023-09-01 NOTE — Telephone Encounter (Signed)
 I called the patient.  He states his symptoms started in December.  They happen a lot when lying down.  He is having to sleep with his head elevated.  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  He said he saw ophthalmology at [facility] back in January where they told him he had increased pressure of the optic nerve but he states they did not seem to make a big of deal of it.  Patient states he is getting ready to have another Eagle syndrome surgery and his surgeon was concerned.  The patient states he has brief episodes where he sees little black spots.  He states he can see okay in general but if he is looking for something in particular, he knows it is there but cannot see it, like he has a blind spot.  He reports ringing in his ears all the time which started in the middle of last year.  I answered the patient's questions.  I informed him that if he does have intracranial hypertension, if left untreated, there is risk for worsening vision or even permanent damage.  Will let his NP know.  May need referral to our office for this.  If he has any loss of vision going forward, even tonight, he needs to proceed immediately to the emergency department for workup and treatment if indicated.  He thanked me for the call.

## 2023-09-02 NOTE — Telephone Encounter (Signed)
 Spoke with Zachary Nash. If pt has a provider he's seen who feels he has IIH, for example the ophthalmologist who noted papilledema, they can send a referral here requesting further evaluation. Spoke with patient and let him know. He verbalized understanding. I suggested he contact his ophthalmologist for this. Also emphasized again that if he has any symptoms of vision loss or visual trouble, even brief, please call 911/go to ER immediately as they can work him up there. The patient verbalized understanding and appreciation.

## 2023-09-06 DIAGNOSIS — H4912 Fourth [trochlear] nerve palsy, left eye: Secondary | ICD-10-CM | POA: Diagnosis not present

## 2023-09-06 DIAGNOSIS — H539 Unspecified visual disturbance: Secondary | ICD-10-CM | POA: Diagnosis not present

## 2023-09-09 NOTE — Telephone Encounter (Signed)
 Called the pt and advised that with the new symptoms going on need a visit to evaluate. Able to schedule the pt for modnay at 8:30 am. Advised pt to have eye md records faxed to us  and arrive by 8 am for check in for us  to have time to complete an assessment if needed

## 2023-09-10 DIAGNOSIS — E785 Hyperlipidemia, unspecified: Secondary | ICD-10-CM | POA: Diagnosis not present

## 2023-09-10 DIAGNOSIS — R944 Abnormal results of kidney function studies: Secondary | ICD-10-CM | POA: Diagnosis not present

## 2023-09-10 DIAGNOSIS — R5383 Other fatigue: Secondary | ICD-10-CM | POA: Diagnosis not present

## 2023-09-10 DIAGNOSIS — R7301 Impaired fasting glucose: Secondary | ICD-10-CM | POA: Diagnosis not present

## 2023-09-13 ENCOUNTER — Encounter: Payer: Self-pay | Admitting: Neurology

## 2023-09-13 ENCOUNTER — Ambulatory Visit (INDEPENDENT_AMBULATORY_CARE_PROVIDER_SITE_OTHER): Admitting: Neurology

## 2023-09-13 VITALS — BP 130/99 | HR 84 | Ht 70.0 in | Wt 211.0 lb

## 2023-09-13 DIAGNOSIS — R430 Anosmia: Secondary | ICD-10-CM | POA: Diagnosis not present

## 2023-09-13 DIAGNOSIS — H9312 Tinnitus, left ear: Secondary | ICD-10-CM | POA: Insufficient documentation

## 2023-09-13 DIAGNOSIS — H532 Diplopia: Secondary | ICD-10-CM | POA: Diagnosis not present

## 2023-09-13 DIAGNOSIS — R413 Other amnesia: Secondary | ICD-10-CM | POA: Diagnosis not present

## 2023-09-13 NOTE — Progress Notes (Addendum)
 [facility]  Provider:  Dr Tatum Corl Referring Provider: Omie Bickers, MD Primary Care Physician:  Omie Bickers, MD  Chief Complaint  Patient presents with   Neurologic Problem    Pt alone, rm 1. Pt presents for new symptoms that have been ongoing since December. He was having headache and went to PCP they sent to eye Md who indicated he had a optic never that was swollen.he was having double vision and ringing in ears which still are present. He states having difficulty with thinking straight and short term forgetfullness. The doc had mentioned concerns of IIH d/t MRI finding of empty sella. Pt has only been seen before for sleep here and last visit was with Samson Croak., NP in feb    HPI:  Zachary CARRENO is a 53 y.o. male and seen here on 09/13/2023 upon referral from Dr. Del Favia and [facility] or a Consultation/ Evaluation of Diplopia, optic never swelling - IIH ? 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  His concern for today for MEMORY. He wants his Headaches also addressed, has not had a spinal tap.   This patient reports onset of HA and  Diplopia over a period of 7 months.  The memory concerns started at the same time.   No family history of dementia. 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  There was no finding that would have led to the assumption that he has demyelination such as multiple sclerosis, there was no blood flow restriction, there was no disc edema and there were low inflammatory markers tested in his blood ESR, CRP, ACH R.  They deferred giving him steroids at that time ophthalmology to follow in inpatient workup.  His medications and febrile we were albuterol , cetirizine , diazepam  as needed,  Cymbalta, Flonase , Robitussin as needed, multivitamin Linzess, naproxen as needed, ondansetron  for nausea, Protonix , and he was given a prednisone  taper but this was not actively used at the time.Aaron Aas  He had an inner ear surgery on the left 2010.  The basic eye exam showed the Snellen Linea test on the right distal acuity 20/30 20/30 dystrophy HCC 20/25, he has corrective glasses, intraocular pressure measurement on the right 12 on the left 14 pupils round reactive to light equal in size, extraocular movement was intact.  Diplopia with distance vision. Horizontal diplopia.    Mr. Lerew then experienced a facial droop with speech dysfunction and very high blood pressures while seen here in the office on May 24, 2023.  He was seen Megan at the time of this was planned to be a regular follow-up on his CPAP needs.  He was sent over to the emergency room and had a styloidectomy on 2-7 through [facility] shortly thereafter he developed word finding difficulty, right-sided facial droop, right sided weakness stuttering and slurred speech.  He was seen at the sleep clinic was advised to come to the ED he was not on antiplatelet or anticoagulation medication as there is no need known to do that.  He arrived at close to 4 PM at the department for emergency medicine visit facial droop aphasia EKG was normal there was sinus tachycardia but no abnormality of  the QRS complexes was seen.  The MRI of the brain was done without contrast showed no stroke mild degenerative disc disease at the upper cervical level, no acute or focal lesion empty sella the study was read by Audree Leas, MD head CT had also also been obtained and this was normal as well.  He was given 1 mg of lorazepam  to help get through the MRI.  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  Patient was agreeable to discharge but also asked to follow-up with  psychology for stress management.  So I have both images available here and they show a normal beautiful brain. We showed the sella, the pituitary and optic chiasm.   His CPAP compliance is excellent , good resolution of apnea.  EDS 14/ 24 points .      09/13/2023    8:21 AM  Montreal Cognitive Assessment   Visuospatial/ Executive (0/5) 4  Naming (0/3) 3  Attention: Read list of digits (0/2) 2  Attention: Read list of letters (0/1) 1  Attention: Serial 7 subtraction starting at 100 (0/3) 1  Language: Repeat phrase (0/2) 0  Language : Fluency (0/1) 0  Abstraction (0/2) 2  Delayed Recall (0/5) 4  Orientation (0/6) 6  Total 23    Inconsistent with STM impairment or with diplopia.    He is actively followed here for OSA on CPAP, Complex apnea.       Review of Systems: Out of a complete 14 system review, the patient complains of only the following symptoms, and all other reviewed systems are negative.  Epworth Sleepiness score: 14  Social History   Socioeconomic History   Marital status: Married    Spouse name: Not on file   Number of children: Not on file   Years of education: Not on file   Highest education level: Not on file  Occupational History   Not on file  Tobacco Use   Smoking status: Never   Smokeless tobacco: Never  Vaping Use   Vaping status: Never Used  Substance and Sexual Activity   Alcohol use: No   Drug use: No   Sexual activity: Not on file  Other Topics Concern   Not on file  Social History Narrative   Not on file   Social Drivers of Health   Financial Resource Strain: Not on file  Food Insecurity: Not on file  Transportation Needs: No Transportation Needs (05/08/2023)   Received from [facility]   Transportation    In the past 12 months, has lack of reliable transportation kept you from medical appointments, meetings, work or from getting things needed for daily living? : No  Physical Activity: Not on file  Stress: Not on file   Social Connections: Unknown (08/11/2021)   Received from [facility]   Social Network    Social Network: Not on file  Intimate Partner Violence: Unknown (07/03/2021)   Received from [facility]   HITS    Physically Hurt: Not on file    Insult or Talk Down To: Not on file    Threaten Physical Harm: Not on file    Scream or Curse: Not on file    Family History  Problem Relation Age of Onset   Heart failure Father    Heart attack Father    Ovarian cancer Mother    Arthritis Mother    Anxiety disorder Mother     Past Medical History:  Diagnosis  Date   Anxiety    Arthritis    lower back, hands   Asthma    Bilateral inguinal hernia 05/29/2016   surgery to repair   Constipation    DDD (degenerative disc disease), lumbar    Fatty liver    GERD (gastroesophageal reflux disease)    History of hiatal hernia    History of kidney stones 2006   passed stone, no surgery   Pneumonia    PONV (postoperative nausea and vomiting)    pt also states his O2 drops after surgery   Radicular pain of left lower extremity    Sleep apnea    patient uses 2 L oxygen  night - no cpap, 07/06/19 on CPAP    Past Surgical History:  Procedure Laterality Date   ANTERIOR CERVICAL DECOMP/DISCECTOMY FUSION N/A 05/26/2017   Procedure: ANTERIOR CERVICAL DECOMPRESSION/DISCECTOMY FUSION TWO LEVELS Cervical five-six, Cervical six-seven;  Surgeon: Virl Grimes, MD;  Location: [facility] OR;  Service: Orthopedics;  Laterality: N/A;   BIOPSY  04/11/2018   Procedure: BIOPSY;  Surgeon: Felecia Hopper, MD;  Location: [facility] ENDOSCOPY;  Service: Gastroenterology;;   CHOLECYSTECTOMY     ESOPHAGEAL DILATION N/A 03/24/2017   Procedure: ESOPHAGEAL DILATION;  Surgeon: Ruby Corporal, MD;  Location: [facility] ENDO SUITE;  Service: Endoscopy;  Laterality: N/A;   ESOPHAGEAL MANOMETRY N/A 04/11/2018   Procedure: ESOPHAGEAL MANOMETRY (EM);  Surgeon: Felecia Hopper, MD;  Location: [facility] ENDOSCOPY;  Service: Gastroenterology;  Laterality:  N/A;   ESOPHAGOGASTRODUODENOSCOPY N/A 03/24/2017   Procedure: ESOPHAGOGASTRODUODENOSCOPY (EGD);  Surgeon: Ruby Corporal, MD;  Location: [facility]ITE;  Service: Endoscopy;  Laterality: N/A;  1155   ESOPHAGOGASTRODUODENOSCOPY (EGD) WITH PROPOFOL  N/A 04/11/2018   Procedure: ESOPHAGOGASTRODUODENOSCOPY (EGD) WITH PROPOFOL ;  Surgeon: Felecia Hopper, MD;  Location: [facility] ENDOSCOPY;  Service: Gastroenterology;  Laterality: N/A;  mano probe to be placed during EGD   INGUINAL HERNIA REPAIR Bilateral 05/29/2016   Procedure: OPEN HERNIA REPAIR INGUINAL ADULT BILATERAL WITH insertion of MESH;  Surgeon: Boyce Byes, MD;  Location: [facility] OR;  Service: General;  Laterality: Bilateral;   Lipoma removed     RADIOLOGY WITH ANESTHESIA N/A 06/14/2018   Procedure: MRI LUMBER SPINE WITHOUT CONTRAST;  Surgeon: Radiologist, Medication, MD;  Location: [facility] OR;  Service: Radiology;  Laterality: N/A;   WISDOM TOOTH EXTRACTION      Current Outpatient Medications  Medication Sig Dispense Refill   albuterol  (VENTOLIN  HFA) 108 (90 Base) MCG/ACT inhaler Inhale 2 puffs into the lungs every 4 (four) hours as needed for wheezing or shortness of breath. 18 g 0   cetirizine  (ZYRTEC  ALLERGY ) 10 MG tablet Take 1 tablet (10 mg total) by mouth daily. 30 tablet 0   DULoxetine (CYMBALTA) 20 MG capsule Take 20 mg by mouth at bedtime.   2   pantoprazole  (PROTONIX ) 40 MG tablet Take 1 tablet (40 mg total) by mouth 2 (two) times daily before a meal. 180 tablet 0   predniSONE  (DELTASONE ) 20 MG tablet Take 2 tablets (40 mg total) by mouth daily with breakfast. 10 tablet 0   promethazine -dextromethorphan (PROMETHAZINE -DM) 6.25-15 MG/5ML syrup Take 5 mLs by mouth 4 (four) times daily as needed. 100 mL 0   No current facility-administered medications for this visit.    Allergies as of 09/13/2023 - Review Complete 09/13/2023  Allergen Reaction Noted   Dilaudid  [hydromorphone  hcl] Anaphylaxis 03/02/2019   Lidocaine  Other (See Comments)  03/28/2018   Penicillins Anaphylaxis, Swelling, and Other (See Comments) 03/17/2012   Latex  03/11/2023   Contrast media [  iodinated contrast media] Palpitations 05/02/2020    Vitals: BP (!) 130/99   Pulse 84   Ht 5' 10 (1.778 m)   Wt 211 lb (95.7 kg)   BMI 30.28 kg/m   @No  data found.     @VITALSLAST3  [[id number]]@ Physical exam:  General: The patient is awake, alert and appears not in acute distress.  The patient is well groomed. Head: Normocephalic, atraumatic.  Neck is supple.  Neck circumference:21 Cardiovascular:  Regular rate and palpable peripheral pulse:  Respiratory: clear to auscultation.    Neurologic exam : The patient is awake and alert, oriented to place and time.   Memory subjective  described as impaired   There is a normal attention span & concentration ability.  Speech is fluent without  dysarthria, dysphonia or aphasia.  Mood and affect are appropriate.  Cranial nerves: Pupils are equal and briskly reactive to light.  Funduscopic exam without evidence of pallor or edema.  Extraocular movements  in vertical and horizontal planes intact and without nystagmus. Visual fields by finger perimetry are intact. Hearing to finger rub intact.  Facial sensation intact to fine touch. Facial motor strength is symmetric and tongue and uvula move midline.  Motor exam:   Normal tone and normal muscle bulk and symmetric normal strength in all extremities. Grip Strength equal. Proximal strength of shoulder muscles and hip flexors was intact  Sensory:  Fine touch and vibration were tested .  Proprioception was tested in the upper extremities only and was  normal.  Coordination: Rapid alternating movements in the fingers/hands were normal.  Finger-to-nose maneuver was tested and showed no evidence of ataxia, dysmetria or tremor.  Gait and station: Patient walked with/ without assistive device .  Core Strength within normal limits.  Stance is stable and of wide/  normal. Base.  Tandem gait is intact , turns with 3 Steps -unfragmented.  Romberg testing swaying in all directions.    Deep tendon reflexes: in the  upper and lower extremities are symmetric and  brisk . Babinski maneuver response is  downgoing.   Assessment: Total time for face to face interview and examination, for review of  images and laboratory testing, neurophysiology testing and pre-existing records, including out-of -network , was 45 minutes. Assessment is as follows here:  1)   Memory by [facility] test  23/ 30 , no previous test to compare ( HS graduate,  was in textiles, in trucking)  .  2)   No diplopia here seen , no nystagmus, no papilledema. 4rth Nerve palsy is resolved.   Plan:  Treatment plan and additional workup planned after today includes:   1)  I offered an LP- a lumbar puncture for opening pressure and to send CSF for testing - but in light of symptom freedom today I feel this is overkill.   2)  The inability to draw a cube on MOCA was fascinating to me. Inconsistent.  Next time MMSE .   3)  functional neurological disorder is certainly in the differential.  No slurred speech here.   4)  I offered a Prn RV if diplopia returns.    Next year due for a new CPAP.   The patient's condition requires frequent monitoring and adjustments in the treatment plan, reflecting the ongoing complexity of care.  This provider is the continuing focal point for all needed services for this condition.   Neomia Banner, MD  [facility] Board certified by [employer] of Sleep Medicine and  Diplomate of the [employer] of Sleep Medicine. Board certified In Neurology through the ABPN, Fellow of the [employer] of Neurology.

## 2023-09-13 NOTE — Patient Instructions (Signed)
 Assessment: Total time for face to face interview and examination, for review of  images and laboratory testing, neurophysiology testing and pre-existing records, including out-of -network , was 45 minutes. 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  2)   No diplopia here seen , no nystagmus, no papilledema.   Plan:  Treatment plan and additional workup planned after today includes:   1)  I offered an LP- a lumbar puncture for opening pressure and to send CSF for testing - but in light of symptom freedom today I feel this is overkill.   2)  The inability to draw a cube on MOCA was fascinating to me. Inconsistent.  Next time MMSE .   3)  functional neurological disorder is certainly in the differential.  No slurred speech here.   4)  I offered a Prn RV if diplopia returns.    Next year due for a new CPAP.   The patient's condition requires frequent monitoring and adjustments in the treatment plan, reflecting the ongoing complexity of care.  This provider is the continuing focal point for all needed services for this condition.

## 2023-09-17 DIAGNOSIS — F411 Generalized anxiety disorder: Secondary | ICD-10-CM | POA: Diagnosis not present

## 2023-09-17 DIAGNOSIS — K219 Gastro-esophageal reflux disease without esophagitis: Secondary | ICD-10-CM | POA: Diagnosis not present

## 2023-09-17 DIAGNOSIS — M2420 Disorder of ligament, unspecified site: Secondary | ICD-10-CM | POA: Diagnosis not present

## 2023-09-17 DIAGNOSIS — R7301 Impaired fasting glucose: Secondary | ICD-10-CM | POA: Diagnosis not present

## 2023-09-17 DIAGNOSIS — K581 Irritable bowel syndrome with constipation: Secondary | ICD-10-CM | POA: Diagnosis not present

## 2023-09-17 DIAGNOSIS — H3581 Retinal edema: Secondary | ICD-10-CM | POA: Diagnosis not present

## 2023-09-17 DIAGNOSIS — R944 Abnormal results of kidney function studies: Secondary | ICD-10-CM | POA: Diagnosis not present

## 2023-09-17 DIAGNOSIS — G629 Polyneuropathy, unspecified: Secondary | ICD-10-CM | POA: Diagnosis not present

## 2023-09-17 DIAGNOSIS — E785 Hyperlipidemia, unspecified: Secondary | ICD-10-CM | POA: Diagnosis not present

## 2023-09-22 NOTE — Telephone Encounter (Signed)
 Spoke with the patient. He has hx of Eagle Syndrome, had transcervical resection of the left styloid on 05/07/23. Plan for right styloidectomy on July 18th with Dr. Lauralee.   On Monday he started having worsening pain on the right side of his face, particularly his right eye, ear, and mouth. He can feel a hard protrusion on the roof of his mouth (soft palate) about the size of the tip of his thumb. He says it feels like it is going to poke through. It feels like its coming from his ear up to the roof of his mouth.   It is very tender and when he pushes it with his tongue, it feels like he is pushing on a nerve. He asks if this is the bone trying to come through?  Also having a lot of pain with swallowing.   He had similar pain like this on the left side prior to his surgery but never felt a lump like this on the roof of his mouth. The only thing that helped previously was a round of steroids.   He is taking tylenol  which helps a little. Cannot take ibuprofen  due to kidney problems.   Please advise Pharmacy up to date in chart

## 2023-09-27 NOTE — Telephone Encounter (Signed)
 Outgoing phone call to patient-full name and DOB verified.  Patient states right facial/throat pain has improved over the weekend.  He is managing pain with tylenol .  Advised him to seed evaluation in ED or urgent care if needed.  He verbalized understanding.

## 2023-10-15 DIAGNOSIS — J45909 Unspecified asthma, uncomplicated: Secondary | ICD-10-CM | POA: Diagnosis not present

## 2023-10-15 DIAGNOSIS — M2420 Disorder of ligament, unspecified site: Secondary | ICD-10-CM | POA: Diagnosis not present

## 2023-10-15 DIAGNOSIS — G4733 Obstructive sleep apnea (adult) (pediatric): Secondary | ICD-10-CM | POA: Diagnosis not present

## 2023-10-15 DIAGNOSIS — M2429 Disorder of ligament, other specified site: Secondary | ICD-10-CM | POA: Diagnosis not present

## 2023-10-15 DIAGNOSIS — Z79899 Other long term (current) drug therapy: Secondary | ICD-10-CM | POA: Diagnosis not present

## 2023-10-15 DIAGNOSIS — M898X8 Other specified disorders of bone, other site: Secondary | ICD-10-CM | POA: Diagnosis not present

## 2023-10-15 NOTE — Anesthesia Preprocedure Evaluation (Signed)
 Patient: Zachary Nash  Procedure Information     Anesthesia Start Date/Time: 10/15/23 1459   Procedure: STYLOIDECTOMY (Right)   Location: [facility] OR 15 / [facility] OR   Surgeons: Fonda Laraine Muscat, MD       Relevant Problems  Anesthesia  (+) Sleep apnea, obstructive    Other  (+) Recurrent tonsillitis    BP (!) 142/92   Pulse 82   Temp 97.7 F (36.5 C) (Temporal)   Resp 16   SpO2 98%    Clinical information reviewed:  Allergies  Meds      Anesthesia Evaluation  Anesthesia History: Patient has no history of anesthetic complications.  PONV Predictive Score (Scale 0-5):  Apfel risk score: 0 Respiratory: Patient has sleep apnea and asthma.  Cardiovascular: Patient has high blood pressure.  HEENT: Additional comments: Eagle syndrome Renal/Gastrointestinal: Patient has GERD.     Physical Exam  Airway  Mallampati: II TM Distance (FB): 3 Oral Aperture (FB): 3 Neck ROM: full ROM Cardiovascular - normal exam Dental - normal exam Pulmonary - normal exam   Anesthesia Plan  Review Preop documentation reviewed: H&P reviewed, NPO Status Reviewed, Preop Vitals Reviewed, Previous Anesthesia Records Reviewed and Periop Tests and Results Reviewed  Plan ASA score: 2  Anesthesia type: general Induction: intravenous Post-op plan: PACU and Extubation in OR  Informed Consent Anesthetic plan and risks discussed with patient. Use of blood products discussed with patient whom consented to blood products. Plan discussed with resident.   Date of Last Liquid: 10/14/23 Time of last liquid: 2300 Date of Last Solid: 10/14/23 Time of last solid: 2300

## 2023-10-15 NOTE — Anesthesia Postprocedure Evaluation (Signed)
 Patient: Zachary Nash  Procedure Summary     Date: 10/15/23 Room / Location: [facility] OR 15 / [facility] OR   Anesthesia Start: 1454 Anesthesia Stop: 1715   Procedure: STYLOIDECTOMY (Right) Diagnosis:      Eagle syndrome     (Eagle syndrome [M24.20])   Surgeons: Fonda Laraine Muscat, MD Responsible Provider: Ozell JINNY Fridge, MD   Anesthesia Type: general ASA Status: 2       Anesthesia Type: general  Vitals Value Taken Time  BP 125/75 10/15/23 17:35  Temp 97.5 F (36.4 C) 10/15/23 17:13  Pulse 102 10/15/23 17:46  Resp 16 10/15/23 17:46  SpO2 95 % 10/15/23 17:46  Vitals shown include unfiled device data.  There were no known notable events for this encounter.  Anesthesia Post Evaluation  Final anesthesia type: general Patient location during evaluation: PACU Patient participation: Patient participated Level of consciousness: awake and alert Pain score: pain well controlled (patient comfortable/resting) Pain management: moderate pain initially, now adequately controlled Post-op nausea and vomiting?: none Post-op vital signs: post-procedure vital signs are stable Patient temperature: Normothermic Cardiovascular status: hemodynamically stable Respiratory status: Stable, room air, spontaneous Hydration status: adequately hydrated Post-op disposition: Inpatient Floor Anesthesia post-op complications?:no complications

## 2023-10-15 NOTE — Anesthesia Procedure Notes (Signed)
  Airway Date/Time: 10/15/2023 3:14 PM Reason: elective  Airway not difficult  General Information and Staff Patient location during procedure: OR Performed: CRNA  CRNA: Damien Essex Mangubat, CRNA  Indications and Patient Condition Indications for airway management: anesthesia and airway protection Sedation level: deep    Preoxygenated: yesPatient position: sniffing Rapid sequence: no Cricoid pressure: no  Mask difficulty assessment: 1 - vent by mask  Final Airway Details  Final airway type: endotracheal airway Successful airway: ETT Cuffed: yes  Successful intubation technique: direct laryngoscopy Adjuncts used in placement: intubating stylet Endotracheal tube insertion site: oral Blade: Macintosh Blade size: #3 Tube size (mm): 7.0 Cormack-Lehane Classification: grade IIa - partial view of glottis Placement verified by: chest auscultation and capnometry  Measured from: lips ETT to lips (cm): 22 Number of attempts at approach: 1 Final Dental Condition: Teeth, lips, and tongue in pre-anesthetic condition Patient tolerated procedure well with no complications  Additional Comments Atraumatic intubation. BSEB.

## 2023-10-15 NOTE — H&P (Signed)
 ------------------------------------------------------------------------------- Attestation signed by Fonda Laraine Muscat, MD at 10/15/2023  2:53 PM I reviewed the patient's status and agree with the plan of care as documented by the resident.   Fonda JONETTA Muscat, MD   -------------------------------------------------------------------------------  Otolaryngology History and Physical  Subjective: Patient is a 53 y.o. 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 The patient has seen Dr. Fonda Laraine Muscat* in consultation and would like to proceed with right styloidectomy.   No new medical history, medicines, or recent illnesses noted.    Medical History[1]  Surgical History[2]  Prescriptions Prior to Admission[3] Allergies[4]     Family History[5]   Review of Systems Negative for recent fever, chills  No chest pain, shortness of breath  No diarrhea, constipation, nausea, emesis No dysuria or hematuria Remainder 14pt ROS negative  Objective: Vital signs in last 24 hours: Temp:  [97.7 F (36.5 C)] 97.7 F (36.5 C) Heart Rate:  [82] 82 Resp:  [16] 16 BP: (142)/(92) 142/92  General: Appears well, no distress Chest: no tachypnea, retractions or cyanosis Cardiac: normal sinus rhythm Abdomen: soft, non-tender Extremities: extremities warm  Neurological: awake, alert   Assessment: 53 yo presenting for right styloidectomy  Plan:  The diagnoses, recommended procedures, and plan of care was once again discussed in detail with the patient. All risks, benefits, and alternatives of the procedure were clearly elucidated. All questions were solicited and answered to satisfaction.  Informed consent was obtained. We will proceed today with operative management as discussed.  Patient and plan of care discussed with Fonda Laraine Muscat* who is in agreement.   Electronically signed by: Lucie Con Downing  Fri 10/15/2023 2:39 PM        [1] Past Medical  History: Diagnosis Date  . Asthma (CMD)   . Cholelithiases   . Chronic kidney disease   . GERD (gastroesophageal reflux disease)   . Hypertension with goal to be determined   . Sleep apnea, obstructive 12/05/2019   on cpap  [2] Past Surgical History: Procedure Laterality Date  . BACK SURGERY     Procedure: BACK SURGERY  . FOOT SURGERY Left 05/07/2023   STYLOIDECTOMY performed by Fonda Laraine Muscat, MD at [facility]  . GALLBLADDER SURGERY     Procedure: GALLBLADDER SURGERY  . INNER EAR SURGERY Left 2010   Procedure: INNER EAR SURGERY  [3] Medications Prior to Admission  Medication Sig Dispense Refill Last Dose/Taking  . albuterol  HFA (PROVENTIL  HFA;VENTOLIN  HFA;PROAIR  HFA) 90 mcg/actuation inhaler    Past Month  . cetirizine  (ZyrTEC ) 10 mg tablet    Past Week  . DULoxetine  (CYMBALTA ) 20 mg capsule    10/14/2023 at  9:00 PM  . fluticasone  propionate (FLONASE ) 50 mcg/spray nasal spray 1 spray.   10/14/2023 at  8:00 AM  . Linzess 290 mcg cap capsule Take 1 capsule by mouth daily.   Past Month  . pantoprazole  (PROTONIX ) 40 mg EC tablet Take 40 mg by mouth 2 (two) times a day.   10/15/2023 at  8:00 AM  . diazePAM  (VALIUM ) 5 mg tablet Take 2.5 mg by mouth nightly as needed for anxiety.   More than a month  . guaiFENesin  (ROBITUSSIN) 100 mg/5 mL syrup Take 100 mg by mouth.   More than a month  . multivitamin cap Take 1 capsule by mouth daily.   More than a month  . multivitamin with minerals tab Take 1 tablet by mouth daily.   More than a month  . naproxen sodium (ANAPROX) 220 mg tablet  Take 440 mg by mouth.   More than a month  . Norel AD 4-10-325 mg tab    More than a month  . ondansetron  (ZOFRAN ) 4 mg tablet    More than a month  . predniSONE  (DELTASONE ) 20 mg tablet Take 40 mg by mouth.   More than a month  . promethazine -dextromethorphan (PHENERGAN  DM) 6.25-15 mg/5 mL syrp syrup Take 5 mL by mouth.   More than a month  [4] Allergies Allergen Reactions  . Benzocaine Anaphylaxis  .  Hydromorphone  Hcl Anaphylaxis  . Lidocaine  Anaphylaxis  . Latex   . Penicillin Itching  . Iodinated Contrast Media Palpitations  [5] No family history on file.

## 2023-10-19 NOTE — Progress Notes (Signed)
 Pt presented to have drain removed from right side of neck.  Pt had 20 cc cherry colored fluid from drain 7/20 and 7/21.  Drain was removed and pt tolerated well. Suture line appears to be healing well. Pt was given instructions for drain site removal care printed on his AVS with our contact information.  Pt confirmed post op appointment.

## 2023-11-09 DIAGNOSIS — M2420 Disorder of ligament, unspecified site: Secondary | ICD-10-CM | POA: Diagnosis not present

## 2023-11-09 NOTE — Progress Notes (Signed)
     History [name]   returns for f/u visit.      He presented to the ED in Dec 2024 with symptoms of retro-orbital pain,  double vision, blurred vision, nausea, confusion . Also with 6 months of a cracking or popping in the upper neck when chewing, some stabbing pain in the left throat when swallowing.       Part of the workup included a CTA neck , which revealed elongated bilateral styloid processes                  He underwent transcervical resection of the left styloid on 05/07/23.  He had relief of left-sided symptoms.    He returned to the OR on 10/15/23 for transcervical resection of the right styloid.       He  has done   well since  the procedure           He is having mild   pain, taking tylenol .          There have been no  problems with the wound.  He denies fevers, drainage, bleeding, or other symptoms.   He is swallowing a normal diet.  Some first-bite pain on the right now.      He   denies any   sore throat, otalgia, sensation of a foreign body in the throat.    No  dysphonia, increased salivation, and headache           PMH - asthma, CKD, GERD, HTN, OSA on cpap    Tobacco - denies        Examination  . Vitals: Blood pressure (!) 124/90, pulse 96, height 1.778 m (5' 10), weight 92.1 kg (203 lb), SpO2 100%.   . General appearance of patient: healthy and no distress   . Quality of voice: no hoarseness, no dysarthria , some word-finding difficulties but inconsistent   . Inspection of head and face: Normocephalic, without obvious abnormality, sinuses nontender to percussion   . Left parotid gland: soft, nontender, no swelling, no masses/lesions  . Right parotid gland: soft, nontender, no swelling, no masses/lesions  . Facial strength: intact, symmetric but on certain mouth movements the right lower lip may be a bit weak , inconsistent   . Oral cavity: tongue mobile, FOM soft, mucosa healthy throughout with no masses, lesions, ulcerations   .  Oropharynx: mucosa of tonsillar fossae healthy; soft palate/uvula intact, mobile, mucosa healthy; no  palpable styloid on either side     . Mirror Examination: normal, TVCs mobile  . Neck: soft, supple, no masses or palpable lymph nodes, right  neck wound healing  nicely, no signs of infection   . Thyroid : normal size, nontender, no nodules palpable      Impression    s/p transcervical excision of   styloid / stylohyoid ligament on the left on 05/07/23  and the right on 10/15/23       He appears to be healing  appropriately     He has relief of a good number of the symptoms in the throat.      Wound care measures were taught to the patient.    I can see him back as needed     He is agreeable with this plan.  All his questions were answered.            cc: JINNY Loader, Norleen Rudolph Shona PONCE, MD      Postop global: [id number]

## 2023-12-09 ENCOUNTER — Encounter (HOSPITAL_COMMUNITY): Payer: Self-pay | Admitting: Registered Nurse

## 2023-12-09 ENCOUNTER — Ambulatory Visit (HOSPITAL_COMMUNITY): Admitting: Registered Nurse

## 2023-12-09 DIAGNOSIS — F411 Generalized anxiety disorder: Secondary | ICD-10-CM | POA: Diagnosis not present

## 2023-12-09 DIAGNOSIS — F320 Major depressive disorder, single episode, mild: Secondary | ICD-10-CM

## 2023-12-09 MED ORDER — DULOXETINE HCL 60 MG PO CPEP: 60.0000 mg | ORAL_CAPSULE | Freq: Every day | ORAL | 0 refills | Status: DC

## 2023-12-09 NOTE — Patient Instructions (Addendum)
 If no one has contacted, you by the end of business day today please call the appropriate office listed below to schedule your next visit for medication management with Luisa Ruder, NP:    [facility] at [facility] [address], [address], [place], [place] [address]  5.4 mi Phone: [phone] (Call to schedule appointment)  [facility] at [facility] [address] [place], [place] [address]  25 mi Phone: [phone] (Call to schedule appointment)   Call 911, [phone], mobile crisis, or present to the nearest emergency room should you experience any suicidal/homicidal ideation, auditory/visual/hallucinations, or detrimental worsening of your mental health.  Mobile Crisis Response Teams Listed by counties in vicinity of [facility] providers [facility] [phone] [facility] [phone] [facility] [phone] [facility] [phone] [place]                * [facility] [phone]                * [facility] [phone]  [facility] [phone] [facility]  [phone] * [facility] [phone]     Here are resources for outpatient resources for counseling/therapy if appointment is to far out for you with [facility].    Online search at Psychologytoday.com  Enter the type of therapist looking for and the area you are in and should pull up therapist in your area.       Same-day appointments Operating hours and clinician availability may delay appointments until the next business day. [place]  New and Current Patients  Psychiatry hours Monday-Friday, 8am-4:30pm (Eastern Time) If you are experiencing problems accessing [facility] send email to: [email or web address] or call [phone], Monday-Friday, 8:00am-4:30pm If you are having a psychiatric or medical emergency, please call 911 or go to the nearest emergency department. To reach the Suicide and Crisis Lifeline, please call or text [phone].  What is [facility]?  [facility] is an online service that provides same-day access to psychiatry to meet urgent mental health needs. The goal is to provide patients with timely intervention and keep them in an outpatient setting.  How long will I wait to see a clinician?  Our goal is to provide same-day care. However, operating hours and clinician availability may delay appointments until the next business day.  What if I can't get an appointment?  Please call [facility] at [phone] 8am to 5pm [place] Time or email us :  [email or web address]  to request the next available time. If you are having a psychiatric or medical emergency, please call 911 or go to the nearest emergency department. To reach the Suicide and Crisis Lifeline, please call or text [phone]. Do you accept insurance?  Yes. We accept most commercial insurance plans. What information do you need from me? New patients should be ready to provide:  Photo ID  Insurance card  Payment information  For current patients, our specialists will confirm your documentation is on file.   Can I continue with regular care after my [facility] session?  Yes. Our goal is to make sure you have the follow-up care you need. Our specialist can help you schedule an appointment with an ongoing provider in addition to your On Demand session.  Can my current clinician provide treatment on [facility]?  No. Our [facility]  clinicians are trained to assist with more immediate needs and provide support between regular appointments with your clinician.  What device can I use to connect with [facility]?  You can use any Wi-Fi-enabled  device with a camera and a microphone, such as a smartphone, tablet, or computer.  Do I have to be at home to connect with [facility]?  No. As long as you are located within [place]  or [place]  you can connect with a provider. We do request that you connect from a safe and private location. Your clinician is required to document your location for emergency purposes.  Insurance:  [employer] OLIP[employer], [place], [employer], [place], [place], [place], [place], [employer], [employer] [phone] [address], [address] [place], [place], [address] [phone] phone Completely online treatment platform Contact: Izola So - Behavioral Health Outreach Specialist [phone] phone [phone] fax     [facility] -- we've launched a new Substance Use Intensive Outpatient Program (IOP) in [place]  for adolescents and young adults aged 64-50.  This program is designed to address the growing need for accessible, comprehensive support for teens and young adults navigating substance use challenges. In addition to the core IOP programming, we're also offering Medication-Assisted Treatment (MAT) services for clients aged 41-50, delivered in a safe and supportive virtual environment.  Our licensed clinicians are trained in co-occurring disorders and will provide individualized care that includes:    Substance use-specific group therapy    Family therapy    Individual support    Psychiatric services when needed       Online search at Psychology Today.  Enter the type of therapist looking for and the area you are in and should pull up therapist in your area.

## 2023-12-09 NOTE — Progress Notes (Signed)
 Psychiatric Initial Adult Assessment   Patient Identification: Zachary Nash MRN:  [id number]  Virtual Visit via Video Note  I connected with Zachary Nash on 12/09/23 at  8:30 AM EDT by a video enabled telemedicine application and verified that I am speaking with the correct person using two identifiers.  Location: Patient: a doctor office parking lot parked car Provider: Home office   I discussed the limitations of evaluation and management by telemedicine and the availability of in person appointments. The patient expressed understanding and agreed to proceed.   I discussed the assessment and treatment plan with the patient. The patient was provided an opportunity to ask questions and all were answered. The patient agreed with the plan and demonstrated an understanding of the instructions.   The patient was advised to call back or seek an in-person evaluation if the symptoms worsen or if the condition fails to improve as anticipated.  I provided 60 minutes of non-face-to-face time during this encounter.   Luisa Ruder, NP   Date of Evaluation:  12/09/2023 Referral Source: Shona Norleen PEDLAR, MD  Chief Complaint:   Chief Complaint  Patient presents with   Establish Care    Medication management   Visit Diagnosis:    ICD-10-CM   1. GAD (generalized anxiety disorder)  F41.1 DULoxetine  (CYMBALTA ) 60 MG capsule    2. Mild major depression (HCC)  F32.0 DULoxetine  (CYMBALTA ) 60 MG capsule      History of Present Illness:  Zachary Nash 53 y.o. male presents today to establish care for medication management.  He was seen via virtual video visit by this provide and chart reviewed on 11/24/23.  His psychiatric history is significant for mild action and general anxiety.  His mental health is currently managed with Cymbalta  40 mg daily.  He reports he feels that Cymbalta  is currently managing his mental health without any adverse reaction.  He does report mild depressive symptoms.  He  reports his anxiety is worse I know God tells as not to worry, but it can be helped sometimes.  He reports his referral risk from neurology doctor that assessed him while in the hospital at the worsening of Bell's Palsy.  He reports the doctor told him that he needed to get help with managing his stress level because increased stress could worsen Bell's palsy.  He reports his primary stressors are 1) Having to come out of work and get on disability.  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  Once it was corrected the doctor told me the damage was done.  I 1 get better but it would get worse either.  He states now he has trouble performing more to task that he was able to do before.  It's like my mind will not let me multitask.  I have to focus on 1 thing at a time.  2) states while he is out of work on disability his wife continues to work.  I can't work and she has to go to work every day.  It makes me feel useless sometimes and I wish there was something I could do.  She is having a lot of issues at work after a change in Insurance account manager.  Her old boss whom she was close to left and she is having to get used to the new bosses and it has been  hard on her.  So, she comes home stressed and takes it out on me sometimes which causes me more stress.  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  That the things have out a lot, taking care of a lot of the things that I can do which leads me wanting to do more but can because of my disability.  He reports he was started on Cymbalta  in 2019 and the last increase to the 40 mg was in 2023.  He denies any other psychotropic medications.  Reports the Cymbalta  is also for fibromyalgia. He reports he is eating without any difficulty.  He states that he has been an increase in  appetite but has been trying to cut back and exercise.  He reports he is able to sleep without any difficulty.  I'm getting 6 to 7 hours of sleep but when I wake I do not feel rested and I am still tired.  I feel like I just want to lay back down and go to sleep.  I am not able to do anything until I had an energy drink.  He denies suicidal/self-harm/homicidal ideation, psychosis, and paranoia.  He reports there has been some abnormal movement with the clenching of teeth and biting on his lip.  Reports these movements have been going on for a while.  He reports the movements are involuntary and not under his control.  He was informed that it may be tardive dyskinesia but would need to have a thorough assessment.  He is wanting his primary care provider to continue psychotropic medication management so, informed he would need to follow-up with his PCP for an assessment to determine if it was TD and if it was TD medications were available that could help. Screenings completed during today's visit PHQ-9, C-SSRS, GAD-7, AIMS, AUDIT, Nutrition, and Pain, see scores below.    Treatment options discussed: He states he feels that the Cymbalta  is working but there has been a while since there has been an increase in the medication.  Reports last increase was in 2023 when it was increased to 40 mg daily.  Reports he is also taking Cymbalta  for the fibromyalgia.  Discussed increasing the medication to 60 mg daily and following up with his PCP for any other changes.  He agreed to the increase in Cymbalta .  Also discussed medications that helped with TD Cordella and Austedo).  Recommendations: Increase Cymbalta  60 mg daily.  Follow-up with PCP informing of increased in management.  Follow-up with PCP on abnormal movement. He voiced understanding and agreement with today's plan and recommendations.  Associated Signs/Symptoms: Depression Symptoms:  depressed mood, anxiety, increased appetite, (Hypo) Manic  Symptoms:  Irritable Mood, Anxiety Symptoms:  Excessive Worry, Psychotic Symptoms:  Denies PTSD Symptoms: NA  Past Psychiatric History:  Diagnosis: Mild depression and General Anxiety Suicide attempt: Denies Non-suicidal self-injurious behavior: Denies Psychiatric hospitalization: Denies Past trauma: Denies Substance abuse: Denies Past psychotropic medication trials: Reports his only ever taking Cymbalta .  Previous Psychotropic Medications: No   Substance Abuse History in the last 12 months:  No.  Consequences of Substance Abuse: NA  Past Medical History:  Past Medical History:  Diagnosis Date   Anxiety    Arthritis    lower back, hands   Asthma    Bilateral inguinal hernia 05/29/2016   surgery to repair   Constipation    DDD (degenerative disc disease),  lumbar    Fatty liver    GERD (gastroesophageal reflux disease)    History of hiatal hernia    History of kidney stones 2006   passed stone, no surgery   Pneumonia    PONV (postoperative nausea and vomiting)    pt also states his O2 drops after surgery   Radicular pain of left lower extremity    Sleep apnea    patient uses 2 L oxygen  night - no cpap, 07/06/19 on CPAP    Past Surgical History:  Procedure Laterality Date   ANTERIOR CERVICAL DECOMP/DISCECTOMY FUSION N/A 05/26/2017   Procedure: ANTERIOR CERVICAL DECOMPRESSION/DISCECTOMY FUSION TWO LEVELS Cervical five-six, Cervical six-seven;  Surgeon: Beuford Anes, MD;  Location: [facility];  Service: Orthopedics;  Laterality: N/A;   BIOPSY  04/11/2018   Procedure: BIOPSY;  Surgeon: Elicia Claw, MD;  Location: [facility];  Service: Gastroenterology;;   CHOLECYSTECTOMY     ESOPHAGEAL DILATION N/A 03/24/2017   Procedure: ESOPHAGEAL DILATION;  Surgeon: Golda Claudis PENNER, MD;  Location: [facility];  Service: Endoscopy;  Laterality: N/A;   ESOPHAGEAL MANOMETRY N/A 04/11/2018   Procedure: ESOPHAGEAL MANOMETRY (EM);  Surgeon: Elicia Claw, MD;  Location: [facility]  ENDOSCOPY;  Service: Gastroenterology;  Laterality: N/A;   ESOPHAGOGASTRODUODENOSCOPY N/A 03/24/2017   Procedure: ESOPHAGOGASTRODUODENOSCOPY (EGD);  Surgeon: Golda Claudis PENNER, MD;  [facility]: [facility];  Service: Endoscopy;  Laterality: N/A;  1155   ESOPHAGOGASTRODUODENOSCOPY (EGD) WITH PROPOFOL  N/A 04/11/2018   Procedure: ESOPHAGOGASTRODUODENOSCOPY (EGD) WITH PROPOFOL ;  Surgeon: Elicia Claw, MD;  Location: [facility];  Service: Gastroenterology;  Laterality: N/A;  mano probe to be placed during EGD   INGUINAL HERNIA REPAIR Bilateral 05/29/2016   Procedure: OPEN HERNIA REPAIR INGUINAL ADULT BILATERAL WITH insertion of MESH;  Surgeon: Elon Pacini, MD;  Location: [facility] OR;  Service: General;  Laterality: Bilateral;   Lipoma removed     RADIOLOGY WITH ANESTHESIA N/A 06/14/2018   Procedure: MRI LUMBER SPINE WITHOUT CONTRAST;  Surgeon: Radiologist, Medication, MD;  Location: [facility] OR;  Service: Radiology;  Laterality: N/A;   WISDOM TOOTH EXTRACTION      Family Psychiatric History: See below and family history  Family History:  Family History  Problem Relation Age of Onset   Heart failure Father    Heart attack Father    Ovarian cancer Mother    Arthritis Mother    Anxiety disorder Mother     Social History:   Social History   Socioeconomic History   Marital status: Married    Spouse name: Not on file   Number of children: Not on file   Years of education: Not on file   Highest education level: Not on file  Occupational History   Not on file  Tobacco Use   Smoking status: Never   Smokeless tobacco: Never  Vaping Use   Vaping status: Never Used  Substance and Sexual Activity   Alcohol use: No   Drug use: No   Sexual activity: Not on file  Other Topics Concern   Not on file  Social History Narrative   Not on file   Social Drivers of Health   Financial Resource Strain: Not on file  Food Insecurity: Not on file  Transportation Needs: No Transportation Needs (05/08/2023)    Received from [facility]   Transportation    In the past 12 months, has lack of reliable transportation kept you from medical appointments, meetings, work or from getting things needed for daily living? 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  Lives with his wife.  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  Allergies:   Allergies  Allergen Reactions   Dilaudid  [Hydromorphone  Hcl] Anaphylaxis   Lidocaine  Other (See Comments)    SYNCOPE passed out   Penicillins Anaphylaxis, Swelling and Other (See Comments)    Has patient had a PCN reaction causing immediate rash, facial/tongue/throat swelling, SOB or lightheadedness with hypotension: yes Has patient had a PCN reaction causing severe rash involving mucus membranes or skin necrosis: yes Has patient had a PCN reaction that required hospitalization: no Has patient had a PCN reaction occurring within the last 10 years: no If all of the above answers are NO, then may proceed with Cephalosporin use.   Latex    Contrast Media [Iodinated Contrast Media] Palpitations    Metabolic Disorder Labs: No labs ordered at this time he wants his PCP to continue psychotropic medication management No results found for: HGBA1C, MPG No results found for: PROLACTIN No results found for: CHOL, TRIG, HDL, CHOLHDL, VLDL, LDLCALC Lab Results  Component Value Date   TSH 1.84 03/09/2018     Current Medications: Current Outpatient Medications  Medication Sig Dispense Refill   albuterol  (VENTOLIN  HFA) 108 (90 Base) MCG/ACT inhaler Inhale 2 puffs into the lungs every 4 (four) hours as needed for wheezing or shortness of breath. 18 g 0   cetirizine  (ZYRTEC  ALLERGY ) 10 MG tablet Take 1 tablet (10 mg total) by mouth daily. 30 tablet 0    DULoxetine  (CYMBALTA ) 60 MG capsule Take 1 capsule (60 mg total) by mouth daily. 30 capsule 0   pantoprazole  (PROTONIX ) 40 MG tablet Take 1 tablet (40 mg total) by mouth 2 (two) times daily before a meal. 180 tablet 0   promethazine -dextromethorphan (PROMETHAZINE -DM) 6.25-15 MG/5ML syrup Take 5 mLs by mouth 4 (four) times daily as needed. 100 mL 0   No current facility-administered medications for this visit.    Musculoskeletal: Strength & Muscle Tone: Unable to assess via virtual visit Gait & Station: Unable to assess via virtual visit Patient leans: N/A  Psychiatric Specialty Exam: Review of Systems  Psychiatric/Behavioral:  Positive for agitation (Reports irritability) and dysphoric mood. Negative for hallucinations, self-injury, sleep disturbance and suicidal ideas. The patient is nervous/anxious.     There were no vitals taken for this visit.There is no height or weight on file to calculate BMI.  General Appearance: Casual  Eye Contact:  Good  Speech:  Clear and Coherent and Normal Rate  Volume:  Normal  Mood:  Anxious and Euthymic  Affect:  Appropriate and Congruent  Thought Process:  Coherent, Goal Directed, and Descriptions of Associations: Intact  Orientation:  Full (Time, Place, and Person)  Thought Content:  Logical  Suicidal Thoughts:  No  Homicidal Thoughts:  No  Memory:  Immediate;   Good Recent;   Good Remote;   Good  Judgement:  Intact  Insight:  Present  Psychomotor Activity:  Normal  Concentration:  Concentration: Good and Attention Span: Good  Recall:  Good  Fund of Knowledge:Good  Language: Good  Akathisia:  No  Handed:  Right  AIMS (if indicated):  done  Assets:  Communication Skills Desire for Improvement Financial Resources/Insurance Housing Physical Health Resilience Social Support Transportation  ADL's:  Intact  Cognition: WNL  Sleep:  Good, no problem going or staying asleep but reports he does not feel rested   Screenings: AIMS     Flowsheet Row Office Visit from 12/09/2023 in [facility] [place]  AIMS Total Score 3   GAD-7    Flowsheet Row Office Visit from 12/09/2023 in [facility] at [place]  Total GAD-7 Score 15   PHQ2-9    Flowsheet Row Office Visit from 12/09/2023 in [facility] at [facility] Total Score 2  PHQ-9 Total Score 9   Flowsheet Row Office Visit from 12/09/2023 in [facility] at [place] ED from 05/24/2023 in [facility] Department at [facility] from 01/15/2023 in [facility] [place]  C-SSRS RISK CATEGORY No Risk No Risk No Risk    Assessment and Plan:  Assessment: Summary of today's assessment: Zachary Nash appears to be doing well.  He reports his referral was for counseling/therapy to gain coping skills to manage his stress.  He reports his current medication (Cymbalta ) appears to be effectively managing his mental health although he reports there is some mild depression and anxiety.  He reports he sleeps without difficulty but does not feel restful when he wakes.  He reports an increase in his appetite but has started working on his diet and has incorporated walking daily for exercise.  He denies suicidal/self-harm/homicidal ideation, psychosis, and paranoia.  He reports is some abnormal movement (grinding teeth, chewing, biting on lip).  He was referred to his PCP for further evaluation/assessment. During visit he was dressed appropriate for age and weather.  He was seated comfortably in view of camera with no noted distress.  He was alert/oriented x 4, calm/cooperative and mood congruent with affect.  He spoke in a clear tone at moderate volume, and normal pace, with good eye contact.  His thought process was coherent, relevant, and there was no indication that he was responding to internal/external stimuli or experiencing  delusional thought content.  1. Mild major depression (HCC) - DULoxetine  (CYMBALTA ) 60 MG capsule; Take 1 capsule (60 mg total) by mouth daily.  Dispense: 30 capsule; Refill: 0  2. GAD (generalized anxiety disorder) (Primary) - DULoxetine  (CYMBALTA ) 60 MG capsule; Take 1 capsule (60 mg total) by mouth daily.  Dispense: 30 capsule; Refill: 0       Plan: Medication management: Meds ordered this encounter  Medications   DULoxetine  (CYMBALTA ) 60 MG capsule    Sig: Take 1 capsule (60 mg total) by mouth daily.    Dispense:  30 capsule    Refill:  0    Supervising Provider:   ARFEEN, SYED T [[id number]]   Medications Discontinued During This Encounter  Medication Reason   predniSONE  (DELTASONE ) 20 MG tablet Completed Course   DULoxetine  (CYMBALTA ) 20 MG capsule Dose change    Labs:  Not indicated.  PCP will continue medication management and order any appropriate labs    Other:  Counseling/Therapy:  Referral to counseling/therapy.  Reports initial referral was for counseling/therapy only to obtain coping skills for stress management. Referral to PCP for abnormal movement assessment/evaluation Zachary Nash was instructed to call [phone], [phone], mobile crisis, or present to the nearest emergency room should he experiences any suicidal/homicidal ideation, auditory/visual/hallucinations, or detrimental worsening of her mental health condition.   Zachary Nash participated in the development of this treatment plan and verbalized his understanding/agreement with plan as  listed.   Follow Up: No need for follow-up with this provider since PCP will manage his psychotropic medications.  Will need to follow-up on referral to counseling/therapy Call in the interim for any side-effects, decompensation, questions, or problems  Collaboration of Care: Medication Management AEB medication assessment, adjustment, and refill, Primary Care Provider AEB referred to PCP for psychotropic medication management and  assessment/evaluation of reported abnormal movements possible tardive dyskinesia, and Referral or follow-up with counselor/therapist AEB referral to counseling/therapy  Patient/Guardian was advised Release of Information must be obtained prior to any record release in order to collaborate their care with an outside provider. Patient/Guardian was advised if they have not already done so to contact the registration department to sign all necessary forms in order for us  to release information regarding their care.   Consent: Patient/Guardian gives verbal consent for treatment and assignment of benefits for services provided during this visit. Patient/Guardian expressed understanding and agreed to proceed.   Sharlize Hoar, NP 9/11/202510:27 AM

## 2023-12-24 ENCOUNTER — Encounter (HOSPITAL_COMMUNITY): Payer: Self-pay

## 2023-12-24 ENCOUNTER — Ambulatory Visit (INDEPENDENT_AMBULATORY_CARE_PROVIDER_SITE_OTHER): Admitting: Clinical

## 2023-12-24 DIAGNOSIS — F419 Anxiety disorder, unspecified: Secondary | ICD-10-CM

## 2023-12-24 DIAGNOSIS — F339 Major depressive disorder, recurrent, unspecified: Secondary | ICD-10-CM | POA: Diagnosis not present
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
## 2023-12-29 DIAGNOSIS — R944 Abnormal results of kidney function studies: Secondary | ICD-10-CM | POA: Diagnosis not present

## 2023-12-29 DIAGNOSIS — Z139 Encounter for screening, unspecified: Secondary | ICD-10-CM | POA: Diagnosis not present

## 2023-12-29 DIAGNOSIS — Z136 Encounter for screening for cardiovascular disorders: Secondary | ICD-10-CM | POA: Diagnosis not present

## 2023-12-30 DIAGNOSIS — E785 Hyperlipidemia, unspecified: Secondary | ICD-10-CM | POA: Diagnosis not present

## 2023-12-30 DIAGNOSIS — T148XXA Other injury of unspecified body region, initial encounter: Secondary | ICD-10-CM | POA: Diagnosis not present

## 2023-12-30 DIAGNOSIS — F411 Generalized anxiety disorder: Secondary | ICD-10-CM | POA: Diagnosis not present

## 2023-12-30 DIAGNOSIS — Z0000 Encounter for general adult medical examination without abnormal findings: Secondary | ICD-10-CM | POA: Diagnosis not present

## 2023-12-30 DIAGNOSIS — R7303 Prediabetes: Secondary | ICD-10-CM | POA: Diagnosis not present

## 2023-12-30 DIAGNOSIS — R7301 Impaired fasting glucose: Secondary | ICD-10-CM | POA: Diagnosis not present

## 2023-12-30 DIAGNOSIS — R944 Abnormal results of kidney function studies: Secondary | ICD-10-CM | POA: Diagnosis not present

## 2023-12-30 DIAGNOSIS — H3581 Retinal edema: Secondary | ICD-10-CM | POA: Diagnosis not present

## 2023-12-30 DIAGNOSIS — K219 Gastro-esophageal reflux disease without esophagitis: Secondary | ICD-10-CM | POA: Diagnosis not present

## 2023-12-30 DIAGNOSIS — Z0001 Encounter for general adult medical examination with abnormal findings: Secondary | ICD-10-CM | POA: Diagnosis not present

## 2024-01-10 ENCOUNTER — Ambulatory Visit (HOSPITAL_COMMUNITY): Admitting: Clinical

## 2024-01-11 ENCOUNTER — Other Ambulatory Visit (HOSPITAL_COMMUNITY): Payer: Self-pay | Admitting: Registered Nurse

## 2024-01-11 ENCOUNTER — Telehealth (HOSPITAL_COMMUNITY): Payer: Self-pay

## 2024-01-11 DIAGNOSIS — F320 Major depressive disorder, single episode, mild: Secondary | ICD-10-CM

## 2024-01-11 DIAGNOSIS — F411 Generalized anxiety disorder: Secondary | ICD-10-CM

## 2024-01-11 MED ORDER — DULOXETINE HCL 60 MG PO CPEP: 60.0000 mg | ORAL_CAPSULE | Freq: Every day | ORAL | 0 refills | Status: DC

## 2024-01-11 NOTE — Telephone Encounter (Signed)
 [facility] on [address] sent over a refill request form for pt's Duloxetine  DR 60 MG Capsules. Pt scheduled 01/17/24. Please advise.

## 2024-01-12 NOTE — Telephone Encounter (Signed)
Spoke with pt advised rx has been sent to pharmacy he verbalized understanding

## 2024-01-17 ENCOUNTER — Encounter (HOSPITAL_COMMUNITY): Payer: Self-pay | Admitting: Registered Nurse

## 2024-01-17 ENCOUNTER — Telehealth (HOSPITAL_COMMUNITY): Admitting: Registered Nurse

## 2024-01-17 DIAGNOSIS — F320 Major depressive disorder, single episode, mild: Secondary | ICD-10-CM

## 2024-01-17 DIAGNOSIS — F411 Generalized anxiety disorder: Secondary | ICD-10-CM | POA: Diagnosis not present

## 2024-01-17 MED ORDER — DULOXETINE HCL 20 MG PO CPEP: 60.0000 mg | ORAL_CAPSULE | Freq: Every day | ORAL | 0 refills | Status: DC

## 2024-01-17 MED ORDER — DULOXETINE HCL 60 MG PO CPEP: 60.0000 mg | ORAL_CAPSULE | Freq: Every day | ORAL | 0 refills | Status: DC

## 2024-01-17 NOTE — Patient Instructions (Signed)

## 2024-01-17 NOTE — Progress Notes (Signed)
 [facility] MD/PA/NP OP Progress Note  01/17/2024 1:27 PM Zachary Nash  MRN:  [id number]  Virtual Visit via Video Note  I connected with Zachary Nash on 01/17/24 at 11:30 AM EDT by a video enabled telemedicine application and verified that I am speaking with the correct person using two identifiers.  Location: Patient: Home Provider: Barbaraann GLAD, [place]   I discussed the limitations of evaluation and management by telemedicine and the availability of in person appointments. The patient expressed understanding and agreed to proceed.  I discussed the assessment and treatment plan with the patient. The patient was provided an opportunity to ask questions and all were answered. The patient agreed with the plan and demonstrated an understanding of the instructions.   The patient was advised to call back or seek an in-person evaluation if the symptoms worsen or if the condition fails to improve as anticipated.  I provided 40 minutes of non-face-to-face time during this encounter.   Zachary Ruder, NP   Chief Complaint:  Chief Complaint  Patient presents with   Follow-up    Medication management   HPI: Zachary Nash 53 y.o. male presents today for medication management follow up.  He was seen via virtual video visit by this provide and chart reviewed on 01/17/24  His psychiatric history is significant for  mild action and general anxiety.  His mental health is currently managed with Cymbalta  60 mg daily.  He denies adverse reactions to medications.  He reports current medication regimen is effectively managing his mental health but feels he could be a little better.  He reports he was referred back to [facility] by PCP for medication management.  Discussed medication adjustments to Cymbalta .  He reports there are no other stressors.  He reports he is eating and sleeping without difficulty.  Today he denies suicidal/self-harm/homicidal ideation, psychosis, paranoia, abnormal movements.   Screenings completed during today's visit PHQ-9, C-SSRS, GAD-7, AIMS, AUDIT, Nutrition, and Pain, see scores below.    Recommendation: Increase Cymbalta  to 80 mg daily He voiced understanding and agreement with today's plan and recommendations.  Visit Diagnosis:    ICD-10-CM   1. GAD (generalized anxiety disorder)  F41.1 DULoxetine  (CYMBALTA ) 60 MG capsule    DULoxetine  (CYMBALTA ) 20 MG capsule    2. 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  Past Medical History:  Past Medical History:  Diagnosis Date   Anxiety    Arthritis    lower back, hands   Asthma    Bilateral inguinal hernia 05/29/2016   surgery to repair   Constipation    DDD (degenerative disc disease), lumbar    Fatty liver    GERD (gastroesophageal reflux disease)    History of hiatal hernia    History of kidney stones 2006   passed stone, no surgery   Pneumonia    PONV (postoperative nausea and vomiting)    pt also states his O2 drops after surgery   Radicular pain of left lower extremity    Sleep apnea    patient uses 2 L oxygen  night - no cpap, 07/06/19 on CPAP    Past Surgical History:  Procedure Laterality Date   ANTERIOR CERVICAL DECOMP/DISCECTOMY  FUSION N/A 05/26/2017   Procedure: ANTERIOR CERVICAL DECOMPRESSION/DISCECTOMY FUSION TWO LEVELS Cervical five-six, Cervical six-seven;  Surgeon: Beuford Anes, MD;  Location: [facility];  Service: Orthopedics;  Laterality: N/A;   BIOPSY  04/11/2018   Procedure: BIOPSY;  Surgeon: Elicia Claw, MD;  Location: [facility];  Service: Gastroenterology;;   CHOLECYSTECTOMY     ESOPHAGEAL DILATION N/A  03/24/2017   Procedure: ESOPHAGEAL DILATION;  Surgeon: Golda Claudis PENNER, MD;  Location: [facility];  Service: Endoscopy;  Laterality: N/A;   ESOPHAGEAL MANOMETRY N/A 04/11/2018   Procedure: ESOPHAGEAL MANOMETRY (EM);  Surgeon: Elicia Claw, MD;  Location: [facility];  Service: Gastroenterology;  Laterality: N/A;   ESOPHAGOGASTRODUODENOSCOPY N/A 03/24/2017   Procedure: ESOPHAGOGASTRODUODENOSCOPY (EGD);  Surgeon: Golda Claudis PENNER, MD;  [facility]: [facility];  Service: Endoscopy;  Laterality: N/A;  1155   ESOPHAGOGASTRODUODENOSCOPY (EGD) WITH PROPOFOL  N/A 04/11/2018   Procedure: ESOPHAGOGASTRODUODENOSCOPY (EGD) WITH PROPOFOL ;  Surgeon: Elicia Claw, MD;  Location: [facility];  Service: Gastroenterology;  Laterality: N/A;  mano probe to be placed during EGD   INGUINAL HERNIA REPAIR Bilateral 05/29/2016   Procedure: OPEN HERNIA REPAIR INGUINAL ADULT BILATERAL WITH insertion of MESH;  Surgeon: Elon Pacini, MD;  Location: [facility] OR;  Service: General;  Laterality: Bilateral;   Lipoma removed     RADIOLOGY WITH ANESTHESIA N/A 06/14/2018   Procedure: MRI LUMBER SPINE WITHOUT CONTRAST;  Surgeon: Radiologist, Medication, MD;  Location: [facility];  Service: Radiology;  Laterality: N/A;   WISDOM TOOTH EXTRACTION      Family Psychiatric History: See below and family history  Family History:  Family History  Problem Relation Age of Onset   Heart failure Father    Heart attack Father    Ovarian cancer Mother    Arthritis Mother    Anxiety disorder Mother     Social History:  Social History   Socioeconomic History   Marital status: Married    Spouse name: Not on file   Number of children: Not on file   Years of education: Not on file   Highest education level: Not on file  Occupational History   Not on file  Tobacco Use   Smoking status: Never   Smokeless tobacco: Never  Vaping Use   Vaping status: Never Used  Substance and Sexual Activity   Alcohol use: No   Drug use: No    Sexual activity: Not on file  Other Topics Concern   Not on file  Social History Narrative   Not on file   Social Drivers of Health   Financial Resource Strain: Not on file  Food Insecurity: Not on file  Transportation Needs: No Transportation Needs (05/08/2023)   Received from [facility]   Transportation    In the past 12 months, has lack of reliable transportation kept you from medical appointments, meetings, work or from getting things needed for daily living? : No  Physical Activity: Not on file  Stress: Not on file  Social Connections: Unknown (08/11/2021)   [facility]   Social Network    Social Network: Not on file    Allergies:  Allergies  Allergen Reactions   Dilaudid  [Hydromorphone  Hcl] Anaphylaxis   Lidocaine  Other (See Comments)    SYNCOPE passed out   Penicillins Anaphylaxis, Swelling and Other (See Comments)    Has patient had a PCN reaction causing immediate rash, facial/tongue/throat swelling, SOB or lightheadedness with hypotension: yes Has patient had a PCN reaction causing severe rash involving mucus membranes or skin  necrosis: yes Has patient had a PCN reaction that required hospitalization: no Has patient had a PCN reaction occurring within the last 10 years: no If all of the above answers are NO, then may proceed with Cephalosporin use.   Latex    Contrast Media [Iodinated Contrast Media] Palpitations    Metabolic Disorder Labs: No results found for: HGBA1C, MPG No results found for: PROLACTIN No results found for: CHOL, TRIG, HDL, CHOLHDL, VLDL, LDLCALC Lab Results  Component Value Date   TSH 1.84 03/09/2018    Current Medications: Current Outpatient Medications  Medication Sig Dispense Refill   albuterol  (VENTOLIN  HFA) 108 (90 Base) MCG/ACT inhaler Inhale 2 puffs into the lungs every 4 (four) hours as needed for wheezing or shortness of breath. 18 g 0   cetirizine  (ZYRTEC  ALLERGY ) 10 MG tablet Take 1  tablet (10 mg total) by mouth daily. 30 tablet 0   DULoxetine  (CYMBALTA ) 20 MG capsule Take 3 capsules (60 mg total) by mouth daily. Take with Cymbalta  60 mg to total 80 mg daily 270 capsule 0   DULoxetine  (CYMBALTA ) 60 MG capsule Take 1 capsule (60 mg total) by mouth daily. Take with Cymbalta  20 mg to total 80 mg daily 90 capsule 0   pantoprazole  (PROTONIX ) 40 MG tablet Take 1 tablet (40 mg total) by mouth 2 (two) times daily before a meal. 180 tablet 0   promethazine -dextromethorphan (PROMETHAZINE -DM) 6.25-15 MG/5ML syrup Take 5 mLs by mouth 4 (four) times daily as needed. 100 mL 0   No current facility-administered medications for this visit.     Musculoskeletal: Strength & Muscle Tone: Unable to assess via virtual visit Gait & Station: Unable to assess via virtual visit Patient leans: N/A  Psychiatric Specialty Exam: Review of Systems  Constitutional:        No other complaints voiced at this time  Psychiatric/Behavioral:  Negative for sleep disturbance and suicidal ideas. Agitation: Reports improved mood. Dysphoric mood: Improved.Nervous/anxious: Improved.   All other systems reviewed and are negative.   There were no vitals taken for this visit.There is no height or weight on file to calculate BMI.  General Appearance: Casual  Eye Contact:  Good  Speech:  Clear and Coherent and Normal Rate  Volume:  Normal  Mood:  Euthymic  Affect:  Appropriate and Congruent  Thought Process:  Coherent, Goal Directed, and Descriptions of Associations: Intact  Orientation:  Full (Time, Place, and Person)  Thought Content: Logical   Suicidal Thoughts:  No  Homicidal Thoughts:  No  Memory:  Immediate;   Good Recent;   Good Remote;   Good  Judgement:  Intact  Insight:  Present  Psychomotor Activity:  Normal  Concentration:  Concentration: Good and Attention Span: Good  Recall:  Good  Fund of Knowledge: Good  Language: Good  Akathisia:  No  Handed:  Right  AIMS (if indicated): done   Assets:  Communication Skills Desire for Improvement Financial Resources/Insurance Housing Leisure Time Physical Health Resilience Social Support Transportation  ADL's:  Intact  Cognition: WNL  Sleep:  Good   Screenings: AIMS    Flowsheet Row Office Visit from 12/09/2023 in [facility] at [place]  AIMS Total Score 3   GAD-7    Flowsheet Row Video Visit from 01/17/2024 in [facility] at [address] Counselor from 12/24/2023 in [facility] at [place] Office Visit from 12/09/2023 in [facility] at [facility]  Total GAD-7 Score 9 12 15  PHQ2-9    Flowsheet Row Video Visit from 01/17/2024 in [facility] at [place] Counselor from 12/24/2023 in [facility] at [place] Office Visit from 12/09/2023 in [facility] at [facility] Total Score 1 2 2   PHQ-9 Total Score 4 10 9    Flowsheet Row Video Visit from 01/17/2024 in [facility] at [place] Counselor from 12/24/2023 in [facility] at [place] Office Visit from 12/09/2023 in [facility] at [place]  C-SSRS RISK CATEGORY No Risk Error: Question 6 not populated No Risk     Assessment and Plan:  Assessment: Summary of today's assessment: Zachary Nash appears to be doing well.   He reports current medications are effectively managing his mental health without any adverse reaction.  He reports he has noticed improvement but feels he could be doing a little better.  Discussed increasing Cymbalta  to 80 mg daily and he agreed.  He reports he is eating and sleeping without difficulty.  He denies suicidal/self-harm/homicidal ideation, psychosis, paranoia, and abnormal movements. During visit he was dressed appropriate for age and weather.  He  was seated comfortably in view of camera with no noted distress.  He was alert/oriented x 4, calm/cooperative and mood congruent with affect.  He spoke in a clear tone at moderate volume, and normal pace, with good eye contact.  His thought process was coherent, relevant, and there was no indication that he was responding to internal/external stimuli or experiencing delusional thought content.  1. Mild major depression - DULoxetine  (CYMBALTA ) 60 MG capsule; Take 1 capsule (60 mg total) by mouth daily. Take with Cymbalta  20 mg to total 80 mg daily  Dispense: 90 capsule; Refill: 0 - DULoxetine  (CYMBALTA ) 20 MG capsule; Take 3 capsules (60 mg total) by mouth daily. Take with Cymbalta  60 mg to total 80 mg daily  Dispense: 270 capsule; Refill: 0  2. GAD (generalized anxiety disorder) (Primary) - DULoxetine  (CYMBALTA ) 60 MG capsule; Take 1 capsule (60 mg total) by mouth daily. Take with Cymbalta  20 mg to total 80 mg daily  Dispense: 90 capsule; Refill: 0 - DULoxetine  (CYMBALTA ) 20 MG capsule; Take 3 capsules (60 mg total) by mouth daily. Take with Cymbalta  60 mg to total 80 mg daily  Dispense: 270 capsule; Refill: 0       Plan: Medication management: Meds ordered this encounter  Medications   DULoxetine  (CYMBALTA ) 60 MG capsule    Sig: Take 1 capsule (60 mg total) by mouth daily. Take with Cymbalta  20 mg to total 80 mg daily    Dispense:  90 capsule    Refill:  0    Supervising Provider:   ARFEEN, SYED T [[id number]]   DULoxetine  (CYMBALTA ) 20 MG capsule    Sig: Take 3 capsules (60 mg total) by mouth daily. Take with Cymbalta  60 mg to total 80 mg daily    Dispense:  270 capsule    Refill:  0    Supervising Provider:   ARFEEN, SYED T [[id number]]   Medications Discontinued During This Encounter  Medication Reason   DULoxetine  (CYMBALTA ) 60 MG capsule Reorder    Labs:  Not indicated at this time.      Other:  Counseling/Therapy:  Referral.  Declined.   Zachary Nash was instructed to call 911, [phone],  mobile crisis, or present to the nearest emergency room should he experiences any suicidal/homicidal ideation, auditory/visual/hallucinations, or detrimental worsening of his  mental health condition.   Zachary Nash participated in the development of this treatment plan and verbalized his understanding/agreement with plan as listed.   Follow Up: Return in 3 months for medication management Call in the interim for any side-effects, decompensation, questions, or problems  Collaboration of Care: Collaboration of Care: Medication Management AEB medication assessment, adjustment, refill and Referral or follow-up with counselor/therapist AEB declined counseling/therapy services  Patient/Guardian was advised Release of Information must be obtained prior to any record release in order to collaborate their care with an outside provider. Patient/Guardian was advised if they have not already done so to contact the registration department to sign all necessary forms in order for us  to release information regarding their care.   Consent: Patient/Guardian gives verbal consent for treatment and assignment of benefits for services provided during this visit. Patient/Guardian expressed understanding and agreed to proceed.    Zachary Bajaj, NP 01/17/2024, 1:27 PM

## 2024-01-25 ENCOUNTER — Ambulatory Visit (HOSPITAL_COMMUNITY): Admitting: Speech Pathology

## 2024-01-25 ENCOUNTER — Telehealth (HOSPITAL_COMMUNITY): Payer: Self-pay | Admitting: Speech Pathology

## 2024-01-25 NOTE — Telephone Encounter (Signed)
 Telephone Call  Pt did not show for his SLP evaluation. SLP left a voicemail to have him return call to reschedule if he desires.  Thank you,  Lamar Candy, CCC-SLP [phone]

## 2024-02-10 ENCOUNTER — Telehealth (HOSPITAL_COMMUNITY): Payer: Self-pay

## 2024-02-10 NOTE — Telephone Encounter (Signed)
 [facility] [address] faxed over a refill request for pt's Duloxetine  DR 60 MG Capsules. Pt scheduled 04/20/24. Please advise

## 2024-02-14 ENCOUNTER — Encounter (HOSPITAL_COMMUNITY): Payer: Self-pay | Admitting: Registered Nurse

## 2024-02-14 ENCOUNTER — Other Ambulatory Visit (HOSPITAL_COMMUNITY): Payer: Self-pay | Admitting: Registered Nurse

## 2024-02-14 DIAGNOSIS — F320 Major depressive disorder, single episode, mild: Secondary | ICD-10-CM

## 2024-02-14 DIAGNOSIS — F411 Generalized anxiety disorder: Secondary | ICD-10-CM

## 2024-02-14 MED ORDER — DULOXETINE HCL 60 MG PO CPEP: 60.0000 mg | ORAL_CAPSULE | Freq: Every day | ORAL | 0 refills | Status: DC

## 2024-02-14 MED ORDER — DULOXETINE HCL 20 MG PO CPEP: 20.0000 mg | ORAL_CAPSULE | Freq: Every day | ORAL | 0 refills | Status: DC

## 2024-02-15 ENCOUNTER — Ambulatory Visit (HOSPITAL_COMMUNITY): Admitting: Speech Pathology

## 2024-02-27 ENCOUNTER — Encounter: Payer: Self-pay | Admitting: Emergency Medicine

## 2024-02-27 ENCOUNTER — Ambulatory Visit
Admission: EM | Admit: 2024-02-27 | Discharge: 2024-02-27 | Disposition: A | Discharge status: Home / Self Care | Attending: Family Medicine | Admitting: Family Medicine

## 2024-02-27 ENCOUNTER — Other Ambulatory Visit: Payer: Self-pay

## 2024-02-27 DIAGNOSIS — J069 Acute upper respiratory infection, unspecified: Secondary | ICD-10-CM

## 2024-02-27 DIAGNOSIS — J4521 Mild intermittent asthma with (acute) exacerbation: Secondary | ICD-10-CM | POA: Diagnosis not present

## 2024-02-27 HISTORY — DX: Disorder of ligament, unspecified site: M24.20

## 2024-02-27 LAB — POC COVID19/FLU A&B COMBO
Covid Antigen, POC: NEGATIVE
Influenza A Antigen, POC: NEGATIVE
Influenza B Antigen, POC: NEGATIVE

## 2024-02-27 MED ORDER — IPRATROPIUM-ALBUTEROL 0.5-2.5 (3) MG/3ML IN SOLN
3.0000 mL | Freq: Once | RESPIRATORY_TRACT | Status: AC
  Administered 2024-02-27: 3 mL via RESPIRATORY_TRACT

## 2024-02-27 MED ORDER — AZELASTINE HCL 0.1 % NA SOLN: 1.0000 | Freq: Two times a day (BID) | NASAL | 0 refills | Status: AC

## 2024-02-27 MED ORDER — PROMETHAZINE-DM 6.25-15 MG/5ML PO SYRP: 5.0000 mL | ORAL_SOLUTION | Freq: Four times a day (QID) | ORAL | 0 refills | Status: DC | PRN

## 2024-02-27 MED ORDER — PREDNISONE 20 MG PO TABS: 40.0000 mg | ORAL_TABLET | Freq: Every day | ORAL | 0 refills | Status: AC

## 2024-02-27 MED ORDER — ALBUTEROL SULFATE HFA 108 (90 BASE) MCG/ACT IN AERS: 2.0000 | INHALATION_SPRAY | RESPIRATORY_TRACT | 0 refills | Status: AC | PRN

## 2024-02-27 NOTE — ED Provider Notes (Signed)
 [facility]    CSN: [id number] Arrival date & time: 02/27/24  1239      History   Chief Complaint Chief Complaint  Patient presents with   Nasal Congestion    HPI Zachary Nash is a 53 y.o. male.   Patient presenting today with 2-day history of productive cough, congestion, chills, sore throat, chest tightness.  Denies fever, chest pain, shortness of breath, abdominal pain, vomiting, diarrhea.  So far trying DayQuil with minimal relief.  History of asthma not currently on an inhaler.    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   Fatty liver    GERD (gastroesophageal reflux disease)    History of hiatal hernia    History of kidney stones 2006   passed stone, no surgery   Pneumonia    PONV (postoperative nausea and vomiting)    pt also states his O2 drops after surgery   Radicular pain of left lower extremity    Sleep apnea    patient uses 2 L oxygen  night - no cpap, 07/06/19 on CPAP    Patient Active Problem List   Diagnosis Date Noted   Transient diplopia 09/13/2023   Subjective tinnitus, left 09/13/2023   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   Memory changes 09/13/2023   Chronic idiopathic constipation 08/26/2020   Chronic superficial gastritis without bleeding 08/26/2020   Gastro-esophageal reflux disease with esophagitis 08/26/2020   Esophageal motility disorder 08/26/2020   COVID-19 01/10/2020   Hypertrophy of both inferior nasal turbinates 12/05/2019   Nasal septal deviation 12/05/2019   Recurrent tonsillitis 12/05/2019   Sleep apnea, obstructive 12/05/2019   Low back pain 03/28/2019   Neck pain 03/28/2019   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   Excessive daytime sleepiness 01/05/2019   PLMD (periodic limb movement disorder) 01/05/2019    Chronic insomnia 01/05/2019   Upper airway cough syndrome 03/31/2018   Cough variant asthma 03/28/2018   DOE (dyspnea on exertion) 03/09/2018   Nocturnal hypoxemia 03/09/2018   Left leg weakness 07/27/2017   Numbness of lower limb 07/27/2017   Spinal cord compression [facility])    Surgery, elective    Cervical myelopathy (HCC)    Syncope, vasovagal    Weakness    Generalized weakness 05/25/2017   Left sided numbness 05/24/2017   Right inguinal pain 04/20/2017   Dysphagia 03/24/2017   Bilateral inguinal hernia 05/29/2016   Post-traumatic headache 05/18/2013   Hyperglycemia 05/18/2013   Radicular pain of left lower extremity    Dizziness 05/16/2013    Past Surgical History:  Procedure Laterality Date   ANTERIOR CERVICAL DECOMP/DISCECTOMY FUSION N/A 05/26/2017   Procedure: ANTERIOR CERVICAL DECOMPRESSION/DISCECTOMY FUSION TWO LEVELS Cervical five-six, Cervical six-seven;  Surgeon: Beuford Anes, MD;  Location: [facility];  Service: Orthopedics;  Laterality: N/A;   BIOPSY  04/11/2018   Procedure: BIOPSY;  Surgeon: Elicia Claw, MD;  Location: [facility];  Service: Gastroenterology;;   CHOLECYSTECTOMY     ESOPHAGEAL DILATION N/A 03/24/2017   Procedure: ESOPHAGEAL DILATION;  Surgeon: Golda Claudis PENNER, MD;  [facility]: [facility];  Service: Endoscopy;  Laterality: N/A;   ESOPHAGEAL MANOMETRY N/A 04/11/2018   Procedure: ESOPHAGEAL MANOMETRY (EM);  Surgeon: Elicia Claw, MD;  [facility]: [facility];  Service: Gastroenterology;  Laterality: N/A;   ESOPHAGOGASTRODUODENOSCOPY N/A 03/24/2017   Procedure: ESOPHAGOGASTRODUODENOSCOPY (EGD);  Surgeon: Golda Claudis PENNER, MD;  [facility];  Service: Endoscopy;  Laterality: N/A;  1155   ESOPHAGOGASTRODUODENOSCOPY (EGD) WITH PROPOFOL  N/A 04/11/2018   Procedure: ESOPHAGOGASTRODUODENOSCOPY (EGD) WITH PROPOFOL ;  Surgeon: Elicia Claw, MD;  [facility]: [facility];  Service: Gastroenterology;  Laterality: N/A;  mano probe to be placed  during EGD   INGUINAL HERNIA REPAIR Bilateral 05/29/2016   Procedure: OPEN HERNIA REPAIR INGUINAL ADULT BILATERAL WITH insertion of MESH;  Surgeon: Elon Pacini, MD;  Location: [facility];  Service: General;  Laterality: Bilateral;   Lipoma removed     RADIOLOGY WITH ANESTHESIA N/A 06/14/2018   Procedure: MRI LUMBER SPINE WITHOUT CONTRAST;  Surgeon: Radiologist, Medication, MD;  Location: [facility];  Service: Radiology;  Laterality: N/A;   WISDOM TOOTH EXTRACTION         Home Medications    Prior to Admission medications   Medication Sig Start Date End Date Taking? Authorizing Provider  albuterol  (VENTOLIN  HFA) 108 (90 Base) MCG/ACT inhaler Inhale 2 puffs into the lungs every 4 (four) hours as needed. 02/27/24  Yes Stuart Vernell Norris, PA-C  azelastine  (ASTELIN ) 0.1 % nasal spray Place 1 spray into both nostrils 2 (two) times daily. Use in each nostril as directed 02/27/24  Yes Stuart Vernell Norris, PA-C  predniSONE  (DELTASONE ) 20 MG tablet Take 2 tablets (40 mg total) by mouth daily with breakfast. 02/27/24  Yes Stuart Vernell Norris, PA-C  promethazine -dextromethorphan (PROMETHAZINE -DM) 6.25-15 MG/5ML syrup Take 5 mLs by mouth 4 (four) times daily as needed. 02/27/24  Yes Stuart Vernell Norris, PA-C  albuterol  (VENTOLIN  HFA) 108 (90 Base) MCG/ACT inhaler Inhale 2 puffs into the lungs every 4 (four) hours as needed for wheezing or shortness of breath. 01/15/23   Stuart Vernell Norris, PA-C  cetirizine  (ZYRTEC  ALLERGY ) 10 MG tablet Take 1 tablet (10 mg total) by mouth daily. 04/28/20   Avegno, Komlanvi S, FNP  DULoxetine  (CYMBALTA ) 20 MG capsule Take 1 capsule (20 mg total) by mouth daily. Take with Cymbalta  60 mg to total 80 mg daily 02/14/24   Rankin, Shuvon B, NP  DULoxetine  (CYMBALTA ) 60 MG capsule Take 1 capsule (60 mg total) by mouth daily. Take with Cymbalta  20 mg to total 80 mg daily 02/14/24   Rankin, Shuvon B, NP  pantoprazole  (PROTONIX ) 40 MG tablet Take 1 tablet (40 mg total) by  mouth 2 (two) times daily before a meal. 05/03/19   Mevelyn Arabia B, PA-C  promethazine -dextromethorphan (PROMETHAZINE -DM) 6.25-15 MG/5ML syrup Take 5 mLs by mouth 4 (four) times daily as needed. 01/15/23   Stuart Vernell Norris, PA-C    Family History Family History  Problem Relation Age of Onset   Heart failure Father    Heart attack Father    Ovarian cancer Mother    Arthritis Mother    Anxiety disorder Mother     Social History Social History   Tobacco Use   Smoking status: Never   Smokeless tobacco: Never  Vaping Use   Vaping status: Never Used  Substance Use Topics   Alcohol use: No   Drug use: No     Allergies   Dilaudid  [hydromorphone  hcl], Lidocaine , Penicillins, Latex, and Contrast media [iodinated contrast media]   Review of Systems Review of Systems PER HPI  Physical Exam Triage Vital Signs ED Triage Vitals  Encounter Vitals Group     BP 02/27/24 1255 113/83     Girls Systolic BP Percentile --  Girls Diastolic BP Percentile --      Boys Systolic BP Percentile --      Boys Diastolic BP Percentile --      Pulse Rate 02/27/24 1255 92     Resp 02/27/24 1255 20     Temp 02/27/24 1255 98.7 F (37.1 C)     Temp Source 02/27/24 1255 Oral     SpO2 02/27/24 1255 98 %     Weight --      Height --      Head Circumference --      Peak Flow --      Pain Score 02/27/24 1252 5     Pain Loc --      Pain Education --      Exclude from Growth Chart --    No data found.  Updated Vital Signs BP 113/83 (BP Location: Right Arm)   Pulse 92   Temp 98.7 F (37.1 C) (Oral)   Resp 20   SpO2 98%   Visual Acuity Right Eye Distance:   Left Eye Distance:   Bilateral Distance:    Right Eye Near:   Left Eye Near:    Bilateral Near:     Physical Exam Vitals and nursing note reviewed.  Constitutional:      Appearance: He is well-developed.  HENT:     Head: Atraumatic.     Right Ear: External ear normal.     Left Ear: External ear normal.     Nose:  Rhinorrhea present.     Mouth/Throat:     Pharynx: Posterior oropharyngeal erythema present. No oropharyngeal exudate.  Eyes:     Conjunctiva/sclera: Conjunctivae normal.     Pupils: Pupils are equal, round, and reactive to light.  Cardiovascular:     Rate and Rhythm: Normal rate and regular rhythm.  Pulmonary:     Effort: Pulmonary effort is normal. No respiratory distress.     Breath sounds: Wheezing present. No rales.  Musculoskeletal:        General: Normal range of motion.     Cervical back: Normal range of motion and neck supple.  Lymphadenopathy:     Cervical: No cervical adenopathy.  Skin:    General: Skin is warm and dry.  Neurological:     Mental Status: He is alert and oriented to person, place, and time.  Psychiatric:        Behavior: Behavior normal.      UC Treatments / Results  Labs (all labs ordered are listed, but only abnormal results are displayed) Labs Reviewed  POC COVID19/FLU A&B COMBO    EKG   Radiology No results found.  Procedures Procedures (including critical care time)  Medications Ordered in UC Medications  ipratropium-albuterol  (DUONEB) 0.5-2.5 (3) MG/3ML nebulizer solution 3 mL (3 mLs Nebulization Given 02/27/24 1333)    Initial Impression / Assessment and Plan / UC Course  I have reviewed the triage vital signs and the nursing notes.  Pertinent labs & imaging results that were available during my care of the patient were reviewed by me and considered in my medical decision making (see chart for details).     Vital signs and exam overall reassuring today, good improvement after DuoNeb treatment symptomatically per patient.  Will treat for viral respiratory infection and asthma exacerbation with prednisone , albuterol , Astelin , Phenergan  DM.  Rapid flu and COVID-negative, suspect another strain or virus causing symptoms.  Return for worsening or unresolving symptoms.  Final Clinical Impressions(s) / UC Diagnoses  Final diagnoses:   Viral URI with cough  Mild intermittent asthma with acute exacerbation   Discharge Instructions   None    ED Prescriptions     Medication Sig Dispense Auth. Provider   predniSONE  (DELTASONE ) 20 MG tablet Take 2 tablets (40 mg total) by mouth daily with breakfast. 10 tablet Stuart Vernell Norris, PA-C   albuterol  (VENTOLIN  HFA) 108 (90 Base) MCG/ACT inhaler Inhale 2 puffs into the lungs every 4 (four) hours as needed. 18 g Stuart Vernell Norris, PA-C   azelastine (ASTELIN) 0.1 % nasal spray Place 1 spray into both nostrils 2 (two) times daily. Use in each nostril as directed 30 mL Stuart Vernell Norris, PA-C   promethazine -dextromethorphan (PROMETHAZINE -DM) 6.25-15 MG/5ML syrup Take 5 mLs by mouth 4 (four) times daily as needed. 100 mL Stuart Vernell Norris, [place]      PDMP not reviewed this encounter.   Stuart Vernell Norris, [place] 02/27/24 1422

## 2024-02-27 NOTE — ED Triage Notes (Addendum)
 Pt reports productive cough, nasal drainage, chills, sore throat since Friday. Denies any known fevers. Has tried dayquil with some improvement of symptoms.   Denied viral testing.

## 2024-03-17 ENCOUNTER — Ambulatory Visit (HOSPITAL_COMMUNITY)
Admission: RE | Admit: 2024-03-17 | Discharge: 2024-03-17 | Disposition: A | Discharge status: Home / Self Care | Source: Ambulatory Visit | Attending: Family Medicine | Admitting: Family Medicine

## 2024-03-17 ENCOUNTER — Other Ambulatory Visit (HOSPITAL_COMMUNITY): Payer: Self-pay | Admitting: Family Medicine

## 2024-03-17 DIAGNOSIS — M79601 Pain in right arm: Secondary | ICD-10-CM

## 2024-04-04 ENCOUNTER — Encounter: Payer: Self-pay | Admitting: Emergency Medicine

## 2024-04-04 ENCOUNTER — Ambulatory Visit
Admission: EM | Admit: 2024-04-04 | Discharge: 2024-04-04 | Disposition: A | Discharge status: Home / Self Care | Attending: Family Medicine | Admitting: Family Medicine

## 2024-04-04 ENCOUNTER — Other Ambulatory Visit: Payer: Self-pay

## 2024-04-04 DIAGNOSIS — J4521 Mild intermittent asthma with (acute) exacerbation: Secondary | ICD-10-CM

## 2024-04-04 DIAGNOSIS — J22 Unspecified acute lower respiratory infection: Secondary | ICD-10-CM | POA: Diagnosis not present

## 2024-04-04 MED ORDER — AZITHROMYCIN 250 MG PO TABS: ORAL_TABLET | ORAL | 0 refills | Status: AC

## 2024-04-04 MED ORDER — PREDNISONE 10 MG PO TABS: ORAL_TABLET | ORAL | 0 refills | Status: AC

## 2024-04-04 MED ORDER — BENZONATATE 200 MG PO CAPS: 200.0000 mg | ORAL_CAPSULE | Freq: Three times a day (TID) | ORAL | 0 refills | Status: AC | PRN

## 2024-04-04 NOTE — ED Triage Notes (Signed)
 Pt reports cough since end of November. Pt reports nasal congestion last week. Generalized body aches since yesterday. Denies any known fevers. Has been taking dayquil.

## 2024-04-04 NOTE — Discharge Instructions (Signed)
 In addition to the prescribed medications, continue your albuterol  inhaler every 4 hours as needed, nasal spray, allergy  medication, supportive over-the-counter cold and congestion medication as well.  Return for worsening or unresolving symptoms

## 2024-04-04 NOTE — ED Provider Notes (Signed)
 " [facility]    CSN: [id number] Arrival date & time: 04/04/24  0807      History   Chief Complaint Chief Complaint  Patient presents with   Cough    HPI Zachary Nash is a 54 y.o. male.   Patient presenting today with about a month of ongoing and worsening nasal congestion, body aches, productive cough, congestion, wheezing, chest tightness, shortness of breath.  Denies known fever, chest pain, abdominal pain, vomiting, diarrhea.  So far trying DayQuil with minimal relief.  History of asthma, pneumonia on albuterol  as needed.    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   Fatty liver    GERD (gastroesophageal reflux disease)    History of hiatal hernia    History of kidney stones 2006   passed stone, no surgery   Pneumonia    PONV (postoperative nausea and vomiting)    pt also states his O2 drops after surgery   Radicular pain of left lower extremity    Sleep apnea    patient uses 2 L oxygen  night - no cpap, 07/06/19 on CPAP    Patient Active Problem List   Diagnosis Date Noted   Transient diplopia 09/13/2023   Subjective tinnitus, left 09/13/2023   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   Memory changes 09/13/2023   Chronic idiopathic constipation 08/26/2020   Chronic superficial gastritis without bleeding 08/26/2020   Gastro-esophageal reflux disease with esophagitis 08/26/2020   Esophageal motility disorder 08/26/2020   COVID-19 01/10/2020   Hypertrophy of both inferior nasal turbinates 12/05/2019   Nasal septal deviation 12/05/2019   Recurrent tonsillitis 12/05/2019   Sleep apnea, obstructive 12/05/2019   Low back pain 03/28/2019   Neck pain 03/28/2019   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   Excessive daytime sleepiness 01/05/2019   PLMD (periodic  limb movement disorder) 01/05/2019   Chronic insomnia 01/05/2019   Upper airway cough syndrome 03/31/2018   Cough variant asthma 03/28/2018   DOE (dyspnea on exertion) 03/09/2018   Nocturnal hypoxemia 03/09/2018   Left leg weakness 07/27/2017   Numbness of lower limb 07/27/2017   Spinal cord compression [facility])    Surgery, elective    Cervical myelopathy (HCC)    Syncope, vasovagal    Weakness    Generalized weakness 05/25/2017   Left sided numbness 05/24/2017   Right inguinal pain 04/20/2017   Dysphagia 03/24/2017   Bilateral inguinal hernia 05/29/2016   Post-traumatic headache 05/18/2013   Hyperglycemia 05/18/2013   Radicular pain of left lower extremity    Dizziness 05/16/2013    Past Surgical History:  Procedure Laterality Date   ANTERIOR CERVICAL DECOMP/DISCECTOMY FUSION N/A 05/26/2017   Procedure: ANTERIOR CERVICAL DECOMPRESSION/DISCECTOMY FUSION TWO LEVELS Cervical five-six, Cervical six-seven;  Surgeon: Beuford Anes, MD;  Location: [facility];  Service: Orthopedics;  Laterality: N/A;   BIOPSY  04/11/2018   Procedure: BIOPSY;  Surgeon: Elicia Claw, MD;  [facility]: [facility];  Service: Gastroenterology;;   CHOLECYSTECTOMY     ESOPHAGEAL DILATION N/A 03/24/2017   Procedure: ESOPHAGEAL DILATION;  Surgeon: Golda Claudis PENNER, MD;  [facility];  Service: Endoscopy;  Laterality: N/A;   ESOPHAGEAL MANOMETRY N/A 04/11/2018   Procedure: ESOPHAGEAL MANOMETRY (EM);  Surgeon: Elicia Claw, MD;  Location: [facility];  Service: Gastroenterology;  Laterality: N/A;   ESOPHAGOGASTRODUODENOSCOPY N/A 03/24/2017   Procedure: ESOPHAGOGASTRODUODENOSCOPY (EGD);  Surgeon: Golda Claudis PENNER, MD;  [facility]: [facility];  Service: Endoscopy;  Laterality: N/A;  1155   ESOPHAGOGASTRODUODENOSCOPY (EGD) WITH PROPOFOL  N/A 04/11/2018   Procedure: ESOPHAGOGASTRODUODENOSCOPY (EGD) WITH PROPOFOL ;  Surgeon: Elicia Claw, MD;  Location: [facility];  Service: Gastroenterology;   Laterality: N/A;  mano probe to be placed during EGD   INGUINAL HERNIA REPAIR Bilateral 05/29/2016   Procedure: OPEN HERNIA REPAIR INGUINAL ADULT BILATERAL WITH insertion of MESH;  Surgeon: Elon Pacini, MD;  Location: [facility];  Service: General;  Laterality: Bilateral;   Lipoma removed     RADIOLOGY WITH ANESTHESIA N/A 06/14/2018   Procedure: MRI LUMBER SPINE WITHOUT CONTRAST;  Surgeon: Radiologist, Medication, MD;  Location: [facility];  Service: Radiology;  Laterality: N/A;   WISDOM TOOTH EXTRACTION         Home Medications    Prior to Admission medications  Medication Sig Start Date End Date Taking? Authorizing Provider  azithromycin  (ZITHROMAX ) 250 MG tablet Take first 2 tablets together, then 1 every day until finished. 04/04/24  Yes Stuart Vernell Norris, PA-C  benzonatate  (TESSALON ) 200 MG capsule Take 1 capsule (200 mg total) by mouth 3 (three) times daily as needed for cough. 04/04/24  Yes Stuart Vernell Norris, PA-C  predniSONE  (DELTASONE ) 10 MG tablet Take 6 tabs day one, 5 tabs day two, 4 tabs day three, etc 04/04/24  Yes Stuart Vernell Norris, PA-C  albuterol  (VENTOLIN  HFA) 108 (90 Base) MCG/ACT inhaler Inhale 2 puffs into the lungs every 4 (four) hours as needed for wheezing or shortness of breath. 01/15/23   Stuart Vernell Norris, PA-C  albuterol  (VENTOLIN  HFA) 108 [phone] Base) MCG/ACT inhaler Inhale 2 puffs into the lungs every 4 (four) hours as needed. 02/27/24   Stuart Vernell Norris, PA-C  azelastine  (ASTELIN ) 0.1 % nasal spray Place 1 spray into both nostrils 2 (two) times daily. Use in each nostril as directed 02/27/24   Stuart Vernell Norris, PA-C  cetirizine  (ZYRTEC  ALLERGY ) 10 MG tablet Take 1 tablet (10 mg total) by mouth daily. 04/28/20   Avegno, Komlanvi S, FNP  DULoxetine  (CYMBALTA ) 20 MG capsule Take 1 capsule (20 mg total) by mouth daily. Take with Cymbalta  60 mg to total 80 mg daily 02/14/24   Rankin, Shuvon B, NP  DULoxetine  (CYMBALTA ) 60 MG capsule Take 1 capsule (60 mg  total) by mouth daily. Take with Cymbalta  20 mg to total 80 mg daily 02/14/24   Rankin, Shuvon B, NP  pantoprazole  (PROTONIX ) 40 MG tablet Take 1 tablet (40 mg total) by mouth 2 (two) times daily before a meal. 05/03/19   Mevelyn Arabia B, PA-C  predniSONE  (DELTASONE ) 20 MG tablet Take 2 tablets (40 mg total) by mouth daily with breakfast. 02/27/24   Stuart Vernell Norris, PA-C  promethazine -dextromethorphan (PROMETHAZINE -DM) 6.25-15 MG/5ML syrup Take 5 mLs by mouth 4 (four) times daily as needed. 01/15/23   Stuart Vernell Norris, PA-C  promethazine -dextromethorphan (PROMETHAZINE -DM) 6.25-15 MG/5ML syrup Take 5 mLs by mouth 4 (four) times daily as needed. 02/27/24   Stuart Vernell Norris, PA-C    Family History Family History  Problem Relation Age of Onset   Heart failure Father    Heart attack Father    Ovarian cancer Mother    Arthritis Mother    Anxiety disorder Mother     Social History Social History[1]   Allergies   Dilaudid  [hydromorphone  hcl], Lidocaine , Penicillins, Latex,  and Contrast media [iodinated contrast media]   Review of Systems Review of Systems PER HPI  Physical Exam Triage Vital Signs ED Triage Vitals [04/04/24 0819]  Encounter Vitals Group     BP 109/71     Girls Systolic BP Percentile      Girls Diastolic BP Percentile      Boys Systolic BP Percentile      Boys Diastolic BP Percentile      Pulse Rate (!) 103     Resp 20     Temp 98.1 F (36.7 C)     Temp Source Oral     SpO2 95 %     Weight      Height      Head Circumference      Peak Flow      Pain Score 5     Pain Loc      Pain Education      Exclude from Growth Chart    No data found.  Updated Vital Signs BP 109/71 (BP Location: Right Arm)   Pulse (!) 103   Temp 98.1 F (36.7 C) (Oral)   Resp 20   SpO2 95%   Visual Acuity Right Eye Distance:   Left Eye Distance:   Bilateral Distance:    Right Eye Near:   Left Eye Near:    Bilateral Near:     Physical Exam Vitals and  nursing note reviewed.  Constitutional:      Appearance: He is well-developed.  HENT:     Head: Atraumatic.     Right Ear: Tympanic membrane and external ear normal.     Left Ear: Tympanic membrane and external ear normal.     Nose: Congestion present.     Mouth/Throat:     Mouth: Mucous membranes are moist.     Pharynx: Posterior oropharyngeal erythema present. No oropharyngeal exudate.  Eyes:     Conjunctiva/sclera: Conjunctivae normal.     Pupils: Pupils are equal, round, and reactive to light.  Cardiovascular:     Rate and Rhythm: Normal rate and regular rhythm.  Pulmonary:     Effort: Pulmonary effort is normal. No respiratory distress.     Breath sounds: Wheezing and rales present.  Musculoskeletal:        General: Normal range of motion.     Cervical back: Normal range of motion and neck supple.  Lymphadenopathy:     Cervical: No cervical adenopathy.  Skin:    General: Skin is warm and dry.  Neurological:     Mental Status: He is alert and oriented to person, place, and time.  Psychiatric:        Behavior: Behavior normal.    UC Treatments / Results  Labs (all labs ordered are listed, but only abnormal results are displayed) Labs Reviewed - No data to display  EKG   Radiology No results found.  Procedures Procedures (including critical care time)  Medications Ordered in UC Medications - No data to display  Initial Impression / Assessment and Plan / UC Course  I have reviewed the triage vital signs and the nursing notes.  Pertinent labs & imaging results that were available during my care of the patient were reviewed by me and considered in my medical decision making (see chart for details).     Given duration and worsening course of symptoms, will treat for lower respiratory infection and asthma exacerbation with prednisone , Zithromax , Tessalon , supportive over-the-counter medications and home care.  Return for worsening or  unresolving symptoms.  Final  Clinical Impressions(s) / UC Diagnoses   Final diagnoses:  Lower respiratory infection  Mild intermittent asthma with acute exacerbation     Discharge Instructions      In addition to the prescribed medications, continue your albuterol  inhaler every 4 hours as needed, nasal spray, allergy  medication, supportive over-the-counter cold and congestion medication as well.  Return for worsening or unresolving symptoms    ED Prescriptions     Medication Sig Dispense Auth. Provider   predniSONE  (DELTASONE ) 10 MG tablet Take 6 tabs day one, 5 tabs day two, 4 tabs day three, etc 21 tablet Stuart Vernell Norris, PA-C   benzonatate  (TESSALON ) 200 MG capsule Take 1 capsule (200 mg total) by mouth 3 (three) times daily as needed for cough. 20 capsule Stuart Vernell Norris, PA-C   azithromycin  (ZITHROMAX ) 250 MG tablet Take first 2 tablets together, then 1 every day until finished. 6 tablet Stuart Vernell Norris, [place]      PDMP not reviewed this encounter.     [1]  Social History Tobacco Use   Smoking status: Never   Smokeless tobacco: Never  Vaping Use   Vaping status: Never Used  Substance Use Topics   Alcohol use: No   Drug use: No     Stuart Vernell Norris, PA-C 04/04/24 1703  "

## 2024-04-06 ENCOUNTER — Encounter: Payer: Self-pay | Admitting: Emergency Medicine

## 2024-04-06 ENCOUNTER — Ambulatory Visit
Admission: EM | Admit: 2024-04-06 | Discharge: 2024-04-06 | Disposition: A | Discharge status: Home / Self Care | Attending: Nurse Practitioner | Admitting: Nurse Practitioner

## 2024-04-06 ENCOUNTER — Ambulatory Visit (INDEPENDENT_AMBULATORY_CARE_PROVIDER_SITE_OTHER)

## 2024-04-06 DIAGNOSIS — R062 Wheezing: Secondary | ICD-10-CM

## 2024-04-06 DIAGNOSIS — J22 Unspecified acute lower respiratory infection: Secondary | ICD-10-CM

## 2024-04-06 MED ORDER — IPRATROPIUM-ALBUTEROL 0.5-2.5 (3) MG/3ML IN SOLN
3.0000 mL | Freq: Once | RESPIRATORY_TRACT | Status: AC
  Administered 2024-04-06: 3 mL via RESPIRATORY_TRACT

## 2024-04-06 MED ORDER — PROMETHAZINE-DM 6.25-15 MG/5ML PO SYRP: 5.0000 mL | ORAL_SOLUTION | Freq: Four times a day (QID) | ORAL | 0 refills | Status: AC | PRN

## 2024-04-06 NOTE — ED Provider Notes (Signed)
 " [facility]    CSN: [id number] Arrival date & time: 04/06/24  1644      History   Chief Complaint No chief complaint on file.   HPI Zachary Nash is a 54 y.o. male.   The history is provided by the patient.   Patient returns for follow-up for wheezing and shortness of breath.  Patient was seen in this clinic on 04/05/2023 and diagnosed with a lower respiratory infection and acute asthma exacerbation.  He was treated with azithromycin , prednisone , benzonatate , and Tessalon .  Patient states he has been taking the medications, states his last nebulizer treatment was at 130.  Patient endorses episodes of coughing fits.  Denies new fever, headache, ear pain, abdominal pain, nausea, vomiting, or diarrhea.  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   Fatty liver    GERD (gastroesophageal reflux disease)    History of hiatal hernia    History of kidney stones 2006   passed stone, no surgery   Pneumonia    PONV (postoperative nausea and vomiting)    pt also states his O2 drops after surgery   Radicular pain of left lower extremity    Sleep apnea    patient uses 2 L oxygen  night - no cpap, 07/06/19 on CPAP    Patient Active Problem List   Diagnosis Date Noted   Transient diplopia 09/13/2023   Subjective tinnitus, left 09/13/2023   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   Memory changes 09/13/2023   Chronic idiopathic constipation 08/26/2020   Chronic superficial gastritis without bleeding 08/26/2020   Gastro-esophageal reflux disease with esophagitis 08/26/2020   Esophageal motility disorder 08/26/2020   COVID-19 01/10/2020   Hypertrophy of both inferior nasal turbinates 12/05/2019   Nasal septal deviation 12/05/2019   Recurrent tonsillitis 12/05/2019   Sleep apnea,  obstructive 12/05/2019   Low back pain 03/28/2019   Neck pain 03/28/2019   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   Excessive daytime sleepiness 01/05/2019   PLMD (periodic limb movement disorder) 01/05/2019   Chronic insomnia 01/05/2019   Upper airway cough syndrome 03/31/2018   Cough variant asthma 03/28/2018   DOE (dyspnea on exertion) 03/09/2018   Nocturnal hypoxemia 03/09/2018   Left leg weakness 07/27/2017   Numbness of lower limb 07/27/2017   Spinal cord compression [facility])    Surgery, elective    Cervical myelopathy (HCC)    Syncope, vasovagal    Weakness    Generalized weakness 05/25/2017   Left sided numbness 05/24/2017   Right inguinal pain 04/20/2017   Dysphagia 03/24/2017   Bilateral inguinal hernia 05/29/2016   Post-traumatic headache 05/18/2013   Hyperglycemia 05/18/2013   Radicular pain of left lower extremity    Dizziness 05/16/2013    Past Surgical History:  Procedure Laterality Date   ANTERIOR CERVICAL DECOMP/DISCECTOMY FUSION N/A 05/26/2017   Procedure: ANTERIOR CERVICAL DECOMPRESSION/DISCECTOMY FUSION TWO LEVELS Cervical five-six, Cervical six-seven;  Surgeon: Beuford Anes, MD;  Location: [facility] OR;  Service: Orthopedics;  Laterality: N/A;   BIOPSY  04/11/2018   Procedure: BIOPSY;  Surgeon: Elicia Claw, MD;  Location: [facility];  Service: Gastroenterology;;   CHOLECYSTECTOMY     ESOPHAGEAL DILATION N/A 03/24/2017   Procedure: ESOPHAGEAL DILATION;  Surgeon: Golda Claudis PENNER, MD;  Location: [facility];  Service: Endoscopy;  Laterality: N/A;   ESOPHAGEAL MANOMETRY N/A 04/11/2018   Procedure: ESOPHAGEAL MANOMETRY (EM);  Surgeon: Elicia Claw, MD;  Location: [facility];  Service: Gastroenterology;  Laterality: N/A;   ESOPHAGOGASTRODUODENOSCOPY N/A 03/24/2017   Procedure: ESOPHAGOGASTRODUODENOSCOPY (EGD);  Surgeon: Golda Claudis PENNER, MD;  Location: [facility];  Service: Endoscopy;  Laterality: N/A;  1155   ESOPHAGOGASTRODUODENOSCOPY (EGD) WITH PROPOFOL   N/A 04/11/2018   Procedure: ESOPHAGOGASTRODUODENOSCOPY (EGD) WITH PROPOFOL ;  Surgeon: Elicia Claw, MD;  Location: [facility];  Service: Gastroenterology;  Laterality: N/A;  mano probe to be placed during EGD   INGUINAL HERNIA REPAIR Bilateral 05/29/2016   Procedure: OPEN HERNIA REPAIR INGUINAL ADULT BILATERAL WITH insertion of MESH;  Surgeon: Elon Pacini, MD;  Location: [facility];  Service: General;  Laterality: Bilateral;   Lipoma removed     RADIOLOGY WITH ANESTHESIA N/A 06/14/2018   Procedure: MRI LUMBER SPINE WITHOUT CONTRAST;  Surgeon: Radiologist, Medication, MD;  Location: [facility];  Service: Radiology;  Laterality: N/A;   WISDOM TOOTH EXTRACTION         Home Medications    Prior to Admission medications  Medication Sig Start Date End Date Taking? Authorizing Provider  promethazine -dextromethorphan (PROMETHAZINE -DM) 6.25-15 MG/5ML syrup Take 5 mLs by mouth 4 (four) times daily as needed. 04/06/24  Yes Leath-Warren, Etta PARAS, NP  albuterol  (VENTOLIN  HFA) 108 (90 Base) MCG/ACT inhaler Inhale 2 puffs into the lungs every 4 (four) hours as needed for wheezing or shortness of breath. 01/15/23   Stuart Vernell Norris, PA-C  albuterol  (VENTOLIN  HFA) 108 (90 Base) MCG/ACT inhaler Inhale 2 puffs into the lungs every 4 (four) hours as needed. 02/27/24   Stuart Vernell Norris, PA-C  azelastine  (ASTELIN ) 0.1 % nasal spray Place 1 spray into both nostrils 2 (two) times daily. Use in each nostril as directed 02/27/24   Stuart Vernell Norris, PA-C  azithromycin  (ZITHROMAX ) 250 MG tablet Take first 2 tablets together, then 1 every day until finished. 04/04/24   Stuart Vernell Norris, PA-C  benzonatate  (TESSALON ) 200 MG capsule Take 1 capsule (200 mg total) by mouth 3 (three) times daily as needed for cough. 04/04/24   Stuart Vernell Norris, PA-C  cetirizine  (ZYRTEC  ALLERGY ) 10 MG tablet Take 1 tablet (10 mg total) by mouth daily. 04/28/20   Avegno, Komlanvi S, FNP  DULoxetine  (CYMBALTA ) 20 MG capsule  Take 1 capsule (20 mg total) by mouth daily. Take with Cymbalta  60 mg to total 80 mg daily 02/14/24   Rankin, Shuvon B, NP  DULoxetine  (CYMBALTA ) 60 MG capsule Take 1 capsule (60 mg total) by mouth daily. Take with Cymbalta  20 mg to total 80 mg daily 02/14/24   Rankin, Shuvon B, NP  pantoprazole  (PROTONIX ) 40 MG tablet Take 1 tablet (40 mg total) by mouth 2 (two) times daily before a meal. 05/03/19   Mevelyn Arabia B, PA-C  predniSONE  (DELTASONE ) 10 MG tablet Take 6 tabs day one, 5 tabs day two, 4 tabs day three, etc 04/04/24   Stuart Vernell Norris, PA-C  predniSONE  (DELTASONE ) 20 MG tablet Take 2 tablets (40 mg total) by mouth daily with breakfast. 02/27/24   Stuart Vernell Norris, PA-C    Family History Family History  Problem Relation Age of Onset   Heart failure Father    Heart attack Father    Ovarian cancer Mother    Arthritis Mother    Anxiety disorder Mother     Social History Social History[1]   Allergies   Dilaudid  [hydromorphone   hcl], Lidocaine , Penicillins, Latex, and Contrast media [iodinated contrast media]   Review of Systems Review of Systems Per HPI  Physical Exam Triage Vital Signs ED Triage Vitals  Encounter Vitals Group     BP 04/06/24 1648 (!) 148/90     Girls Systolic BP Percentile --      Girls Diastolic BP Percentile --      Boys Systolic BP Percentile --      Boys Diastolic BP Percentile --      Pulse Rate 04/06/24 1648 (!) 107     Resp 04/06/24 1648 20     Temp 04/06/24 1648 97.8 F (36.6 C)     Temp Source 04/06/24 1648 Oral     SpO2 04/06/24 1648 98 %     Weight --      Height --      Head Circumference --      Peak Flow --      Pain Score 04/06/24 1651 8     Pain Loc --      Pain Education --      Exclude from Growth Chart --    No data found.  Updated Vital Signs BP (!) 148/90 (BP Location: Right Arm)   Pulse 97   Temp 97.8 F (36.6 C) (Oral)   Resp 20   SpO2 97%   Visual Acuity Right Eye Distance:   Left Eye Distance:    Bilateral Distance:    Right Eye Near:   Left Eye Near:    Bilateral Near:     Physical Exam Vitals and nursing note reviewed.  Constitutional:      General: He is not in acute distress.    Appearance: Normal appearance.  HENT:     Head: Normocephalic.  Eyes:     Extraocular Movements: Extraocular movements intact.     Pupils: Pupils are equal, round, and reactive to light.  Cardiovascular:     Rate and Rhythm: Normal rate and regular rhythm.     Pulses: Normal pulses.     Heart sounds: Normal heart sounds.  Pulmonary:     Effort: Pulmonary effort is normal.     Breath sounds: Wheezing present.     Comments: DuoNeb administered.  Post DuoNeb, improvement of wheezing and rhonchi noted. Abdominal:     General: Bowel sounds are normal.     Palpations: Abdomen is soft.  Musculoskeletal:     Cervical back: Normal range of motion.  Skin:    General: Skin is warm and dry.  Neurological:     General: No focal deficit present.     Mental Status: He is alert and oriented to person, place, and time.  Psychiatric:        Mood and Affect: Mood normal.        Behavior: Behavior normal.      UC Treatments / Results  Labs (all labs ordered are listed, but only abnormal results are displayed) Labs Reviewed - No data to display  EKG   Radiology DG Chest 2 View Result Date: 04/06/2024 EXAM: 2 VIEW(S) XRAY OF THE CHEST 04/06/2024 05:29:19 PM COMPARISON: Comparison with 05/02/2020. CLINICAL HISTORY: cough, SOB and wheezingx 1 month FINDINGS: LUNGS AND PLEURA: Emphysematous changes in the lungs. Central peribronchial thickening and interstitial changes likely representing chronic bronchitis. No airspace disease or consolidation in the lungs. No pleural effusion or pneumothorax. HEART AND MEDIASTINUM: Heart size and pulmonary vascularity are normal. Mediastinal contours appear intact. BONES AND SOFT TISSUES: Postoperative changes in the  cervical spine. Degenerative changes in the spine.  IMPRESSION: 1. No airspace disease or consolidation. No pleural effusion or pneumothorax. 2. Emphysematous changes in the lungs with central peribronchial thickening and interstitial changes likely representing chronic bronchitis. Electronically signed by: Elsie Gravely MD 04/06/2024 05:41 PM EST RP Workstation: [id number]    Procedures Procedures (including critical care time)  Medications Ordered in UC Medications  ipratropium-albuterol  (DUONEB) 0.5-2.5 (3) MG/3ML nebulizer solution 3 mL (3 mLs Nebulization Given 04/06/24 1700)    Initial Impression / Assessment and Plan / UC Course  I have reviewed the triage vital signs and the nursing notes.  Pertinent labs & imaging results that were available during my care of the patient were reviewed by me and considered in my medical decision making (see chart for details).  The x-ray is negative for pneumonia.  Patient was prescribed azithromycin  2 days ago along with a prednisone  taper, and benzonatate .  DuoNeb was administered today.  Will have patient continue his current treatment regimen.  Promethazine  DM prescribed for cough.  Supportive care recommendations were provided and discussed with the patient to include fluids, rest, over-the-counter analgesics, and use of a humidifier at nighttime during sleep.  Patient was advised if symptoms fail to improve, recommend follow-up with his primary care physician.  Patient was given strict ER follow-up precautions.  Patient was in agreement with this plan of care and verbalizes understanding.  All questions were answered.  Patient stable for discharge.   Final Clinical Impressions(s) / UC Diagnoses   Final diagnoses:  Wheezing  Lower respiratory infection     Discharge Instructions      The chest x-ray was negative for pneumonia.  It does show chronic changes representing chronic bronchitis. Take medication as prescribed.  Continue the current medications previously prescribed. Increase  fluids and allow for plenty of rest. You may take over-the-counter Tylenol  as needed for pain, fever, or general discomfort. Recommend use of a humidifier in your bedroom at nighttime during sleep and sleeping elevated on pillows while symptoms persist. Go to the emergency department if you experience worsening wheezing, shortness of breath, difficulty breathing, chest pain, or other concerns. Recommend follow-up with your primary care physician within the next 5 to 7 days for reevaluation. Follow-up as needed.     ED Prescriptions     Medication Sig Dispense Auth. Provider   promethazine -dextromethorphan (PROMETHAZINE -DM) 6.25-15 MG/5ML syrup Take 5 mLs by mouth 4 (four) times daily as needed. 118 mL Leath-Warren, Etta PARAS, NP      PDMP not reviewed this encounter.    [1]  Social History Tobacco Use   Smoking status: Never   Smokeless tobacco: Never  Vaping Use   Vaping status: Never Used  Substance Use Topics   Alcohol use: No   Drug use: No     Gilmer Etta PARAS, NP 04/06/24 1750  "

## 2024-04-06 NOTE — Discharge Instructions (Addendum)
 The chest x-ray was negative for pneumonia.  It does show chronic changes representing chronic bronchitis. Take medication as prescribed.  Continue the current medications previously prescribed. Increase fluids and allow for plenty of rest. You may take over-the-counter Tylenol  as needed for pain, fever, or general discomfort. Recommend use of a humidifier in your bedroom at nighttime during sleep and sleeping elevated on pillows while symptoms persist. Go to the emergency department if you experience worsening wheezing, shortness of breath, difficulty breathing, chest pain, or other concerns. Recommend follow-up with your primary care physician within the next 5 to 7 days for reevaluation. Follow-up as needed.

## 2024-04-06 NOTE — ED Triage Notes (Signed)
 Was seen on 1/6 for respiratory infection.  States his symptoms are worse.  Cough, wheezing, SOB since Thursday of last week.  States he is worried he has pneumonia.  States chest hurts when coughing.

## 2024-04-20 ENCOUNTER — Telehealth (HOSPITAL_COMMUNITY): Admitting: Registered Nurse

## 2024-04-20 ENCOUNTER — Encounter (HOSPITAL_COMMUNITY): Payer: Self-pay | Admitting: Registered Nurse

## 2024-04-20 DIAGNOSIS — F320 Major depressive disorder, single episode, mild: Secondary | ICD-10-CM | POA: Diagnosis not present

## 2024-04-20 DIAGNOSIS — F411 Generalized anxiety disorder: Secondary | ICD-10-CM | POA: Diagnosis not present

## 2024-04-20 MED ORDER — DULOXETINE HCL 60 MG PO CPEP: 60.0000 mg | ORAL_CAPSULE | Freq: Every day | ORAL | 0 refills | Status: AC

## 2024-04-20 MED ORDER — BUSPIRONE HCL 5 MG PO TABS: 5.0000 mg | ORAL_TABLET | Freq: Two times a day (BID) | ORAL | 0 refills | Status: AC

## 2024-04-20 MED ORDER — DULOXETINE HCL 20 MG PO CPEP: 20.0000 mg | ORAL_CAPSULE | Freq: Every day | ORAL | 0 refills | Status: AC

## 2024-04-20 NOTE — Progress Notes (Signed)
 [facility] MD/PA/NP OP Progress Note  04/20/2024 10:30 AM Zachary Nash  MRN:  [id number]  Virtual Visit via Video Note  I connected with Zachary Nash on 04/20/24 at  9:30 AM EST by a video enabled telemedicine application and verified that I am speaking with the correct person using two identifiers.  Location: Patient: Home Provider: Home office   I discussed the limitations of evaluation and management by telemedicine and the availability of in person appointments. The patient expressed understanding and agreed to proceed.  I discussed the assessment and treatment plan with the patient. The patient was provided an opportunity to ask questions and all were answered. The patient agreed with the plan and demonstrated an understanding of the instructions.   The patient was advised to call back or seek an in-person evaluation if the symptoms worsen or if the condition fails to improve as anticipated.  I provided 20 minutes of non-face-to-face time during this encounter.  I personally spent a total of 20 minutes in the care of the patient today including preparing to see the patient, getting/reviewing separately obtained history, performing a medically appropriate exam/evaluation, counseling and educating, placing orders, and documenting clinical information in the EHR in addiction to conducting screenings PHQ-9, GAD-7, AIMS, Nutrition, and Pain, discussing medication options, medication education, and discussing safety.  Luisa Ruder, NP   Chief Complaint:  Chief Complaint  Patient presents with   Follow-up    Medication management   HPI: Zachary Nash 54 y.o. male presents today for medication management follow up.  He was seen via virtual video visit by this provide and chart reviewed on 04/20/24  His psychiatric history is significant for  major depression and general anxiety.  His mental health is currently managed with Cymbalta  80 mg daily.    Visit Diagnosis:    ICD-10-CM   1.  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 GAD (generalized anxiety disorder)  F41.1 DULoxetine  (CYMBALTA ) 20 MG capsule    DULoxetine  (CYMBALTA ) 60 MG capsule    busPIRone  (BUSPAR ) 5 MG tablet     Past Psychiatric History:  Diagnosis: Mild depression and General Anxiety Suicide attempt: Denies Non-suicidal self-injurious behavior: Denies Psychiatric hospitalization: Denies Past trauma: Denies Substance abuse: Denies Past psychotropic medication trials: Reports his only ever taking Cymbalta .  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   Fatty liver    GERD (gastroesophageal reflux disease)    History of hiatal hernia    History of kidney stones 2006   passed stone, no surgery   Pneumonia    PONV (postoperative nausea and vomiting)    pt also states his O2 drops after surgery   Radicular pain of left lower extremity    Sleep apnea    patient uses 2 L oxygen  night - no cpap, 07/06/19 on CPAP    Past Surgical History:  Procedure Laterality Date   ANTERIOR CERVICAL DECOMP/DISCECTOMY FUSION N/A 05/26/2017   Procedure: ANTERIOR CERVICAL DECOMPRESSION/DISCECTOMY FUSION TWO LEVELS Cervical five-six, Cervical six-seven;  Surgeon: Beuford Anes, MD;  Location: [facility];  Service: Orthopedics;  Laterality: N/A;  BIOPSY  04/11/2018   Procedure: BIOPSY;  Surgeon: Elicia Claw, MD;  Location: [facility];  Service: Gastroenterology;;   CHOLECYSTECTOMY     ESOPHAGEAL DILATION N/A 03/24/2017   Procedure: ESOPHAGEAL DILATION;  Surgeon: Golda Claudis PENNER, MD;  [facility];  Service: Endoscopy;  Laterality: N/A;   ESOPHAGEAL MANOMETRY N/A 04/11/2018   Procedure: ESOPHAGEAL  MANOMETRY (EM);  Surgeon: Elicia Claw, MD;  Location: [facility];  Service: Gastroenterology;  Laterality: N/A;   ESOPHAGOGASTRODUODENOSCOPY N/A 03/24/2017   Procedure: ESOPHAGOGASTRODUODENOSCOPY (EGD);  Surgeon: Golda Claudis PENNER, MD;  [facility];  Service: Endoscopy;  Laterality: N/A;  1155   ESOPHAGOGASTRODUODENOSCOPY (EGD) WITH PROPOFOL  N/A 04/11/2018   Procedure: ESOPHAGOGASTRODUODENOSCOPY (EGD) WITH PROPOFOL ;  Surgeon: Elicia Claw, MD;  Location: [facility];  Service: Gastroenterology;  Laterality: N/A;  mano probe to be placed during EGD   INGUINAL HERNIA REPAIR Bilateral 05/29/2016   Procedure: OPEN HERNIA REPAIR INGUINAL ADULT BILATERAL WITH insertion of MESH;  Surgeon: Elon Pacini, MD;  Location: [facility];  Service: General;  Laterality: Bilateral;   Lipoma removed     RADIOLOGY WITH ANESTHESIA N/A 06/14/2018   Procedure: MRI LUMBER SPINE WITHOUT CONTRAST;  Surgeon: Radiologist, Medication, MD;  Location: [facility];  Service: Radiology;  Laterality: N/A;   WISDOM TOOTH EXTRACTION      Family Psychiatric History: See below and family history  Family History:  Family History  Problem Relation Age of Onset   Heart failure Father    Heart attack Father    Ovarian cancer Mother    Arthritis Mother    Anxiety disorder Mother     Social History:  Social History   Socioeconomic History   Marital status: Married    Spouse name: Not on file   Number of children: Not on file   Years of education: Not on file   Highest education level: Not on file  Occupational History   Not on file  Tobacco Use   Smoking status: Never   Smokeless tobacco: Never  Vaping Use   Vaping status: Never Used  Substance and Sexual Activity   Alcohol use: No   Drug use: No   Sexual activity: Not on file  Other Topics Concern   Not on file  Social History Narrative   Not on file   Social Drivers of Health   Tobacco Use: Low Risk (04/20/2024)   Patient History    Smoking  Tobacco Use: Never    Smokeless Tobacco Use: Never    Passive Exposure: Not on file  Financial Resource Strain: Not on file  Food Insecurity: Not on file  Transportation Needs: No Transportation Needs (05/08/2023)   Received from [facility]   Transportation    In the past 12 months, has lack of reliable transportation kept you from medical appointments, meetings, work or from getting things needed for daily living? : No  Physical Activity: Not on file  Stress: Not on file  Social Connections: Unknown (08/11/2021)   Received from [facility]   Social Network    Social Network: Not on file  Depression (PHQ2-9): Low Risk (04/20/2024)   Depression (PHQ2-9)    PHQ-2 Score: 0  Alcohol Screen: Low Risk (12/09/2023)   Alcohol Screen    Last Alcohol Screening Score (AUDIT): 0  Housing: Low Risk (05/08/2023)   Received from [facility]   Epic    What is your living situation today?: I have a steady place to live    Think about the  place you live. Do you have problems with any of the following? Choose all that apply:: None/None on this list  Utilities: Low Risk (05/08/2023)   Received from [facility]   Utilities    In the past 12 months has the electric, gas, oil, or water company threatened to shut off services in your home? : No  Health Literacy: Not on file    Allergies:  Allergies  Allergen Reactions   Dilaudid  [Hydromorphone  Hcl] Anaphylaxis   Lidocaine  Other (See Comments)    SYNCOPE passed out   Penicillins Anaphylaxis, Swelling and Other (See Comments)    Has patient had a PCN reaction causing immediate rash, facial/tongue/throat swelling, SOB or lightheadedness with hypotension: yes Has patient had a PCN reaction causing severe rash involving mucus membranes or skin necrosis: yes Has patient had a PCN reaction that required hospitalization: no Has patient had a PCN reaction occurring within the last 10 years: no If all of the above answers are NO, then may proceed with  Cephalosporin use.   Latex    Contrast Media [Iodinated Contrast Media] Palpitations    Metabolic Disorder Labs: No results found for: HGBA1C, MPG No results found for: PROLACTIN No results found for: CHOL, TRIG, HDL, CHOLHDL, VLDL, LDLCALC Lab Results  Component Value Date   TSH 1.84 03/09/2018    Current Medications: Current Outpatient Medications  Medication Sig Dispense Refill   busPIRone  (BUSPAR ) 5 MG tablet Take 1 tablet (5 mg total) by mouth 2 (two) times daily. 180 tablet 0   albuterol  (VENTOLIN  HFA) 108 (90 Base) MCG/ACT inhaler Inhale 2 puffs into the lungs every 4 (four) hours as needed for wheezing or shortness of breath. 18 g 0   albuterol  (VENTOLIN  HFA) 108 (90 Base) MCG/ACT inhaler Inhale 2 puffs into the lungs every 4 (four) hours as needed. 18 g 0   azelastine  (ASTELIN ) 0.1 % nasal spray Place 1 spray into both nostrils 2 (two) times daily. Use in each nostril as directed 30 mL 0   azithromycin  (ZITHROMAX ) 250 MG tablet Take first 2 tablets together, then 1 every day until finished. 6 tablet 0   benzonatate  (TESSALON ) 200 MG capsule Take 1 capsule (200 mg total) by mouth 3 (three) times daily as needed for cough. 20 capsule 0   cetirizine  (ZYRTEC  ALLERGY ) 10 MG tablet Take 1 tablet (10 mg total) by mouth daily. 30 tablet 0   DULoxetine  (CYMBALTA ) 20 MG capsule Take 1 capsule (20 mg total) by mouth daily. Take with Cymbalta  60 mg to total 80 mg daily 90 capsule 0   DULoxetine  (CYMBALTA ) 60 MG capsule Take 1 capsule (60 mg total) by mouth daily. Take with Cymbalta  20 mg to total 80 mg daily 90 capsule 0   pantoprazole  (PROTONIX ) 40 MG tablet Take 1 tablet (40 mg total) by mouth 2 (two) times daily before a meal. 180 tablet 0   predniSONE  (DELTASONE ) 10 MG tablet Take 6 tabs day one, 5 tabs day two, 4 tabs day three, etc 21 tablet 0   predniSONE  (DELTASONE ) 20 MG tablet Take 2 tablets (40 mg total) by mouth daily with breakfast. 10 tablet 0    promethazine -dextromethorphan (PROMETHAZINE -DM) 6.25-15 MG/5ML syrup Take 5 mLs by mouth 4 (four) times daily as needed. 118 mL 0   No current facility-administered medications for this visit.    Musculoskeletal: Strength & Muscle Tone: Unable to assess via virtual visit Gait & Station: Unable to assess via virtual visit Patient leans: N/A  Psychiatric Specialty Exam:  Review of Systems  Constitutional:        No other complaints voiced at this time  Psychiatric/Behavioral:  Negative for hallucinations, self-injury, sleep disturbance and suicidal ideas. Agitation: Continues to improve. Dysphoric mood: Continues to improve.The patient is nervous/anxious.   All other systems reviewed and are negative.   There were no vitals taken for this visit.There is no height or weight on file to calculate BMI.  General Appearance: Casual  Eye Contact:  Good  Speech:  Clear and Coherent and Normal Rate  Volume:  Normal  Mood:  Euthymic  Affect:  Congruent  Thought Process:  Coherent, Goal Directed, and Descriptions of Associations: Intact  Orientation:  Full (Time, Place, and Person)  Thought Content: Logical   Suicidal Thoughts:  No  Homicidal Thoughts:  No  Memory:  Immediate;   Good Recent;   Good Remote;   Good  Judgement:  Intact  Insight:  Present  Psychomotor Activity:  Normal  Concentration:  Concentration: Good and Attention Span: Good  Recall:  Good  Fund of Knowledge: Good  Language: Good  Akathisia:  No  Handed:  Right  AIMS (if indicated): done  Assets:  Communication Skills Desire for Improvement Financial Resources/Insurance Housing Leisure Time Physical Health Resilience Social Support Transportation  ADL's:  Intact  Cognition: WNL  Sleep:  Good   Screenings: AIMS    Flowsheet Row Video Visit from 04/20/2024 in [facility] [place] Office Visit from 12/09/2023 in [facility] [place]  AIMS  Total Score 0 3   GAD-7    Flowsheet Row Video Visit from 04/20/2024 in [facility] [place] Video Visit from 01/17/2024 in [facility] [place] Counselor from 12/24/2023 in [facility] [place] Office Visit from 12/09/2023 in [facility] [place]  Total GAD-7 Score 10 9 12 15    PHQ2-9    Flowsheet Row Video Visit from 04/20/2024 in [facility] at [place] Video Visit from 01/17/2024 in [facility] at [place] Counselor from 12/24/2023 in [facility] at [place] Office Visit from 12/09/2023 in [facility] Total Score 0 1 2 2   PHQ-9 Total Score -- 4 10 9    Flowsheet Row Video Visit from 04/20/2024 in [facility] [place] [facility] from 04/06/2024 in [facility] [place] [facility] from 04/04/2024 in [facility] RISK CATEGORY No Risk No Risk No Risk   Assessment and Plan:  Assessment:   Zachary Nash reports current medication regimen is effectively managing mental health without adverse reaction.  He reports he feels that his quality of life is a lot better.  He reports the biggest thing was trying to get his confidence back after everything that he went through You know what I mean when the blood not getting where it needs to go (referring to ED).  He reports he started a diet on New Year's day which has been going well and also helping him to feel better.  However, he does report he continues to have anxiousness and excessive worrying.  He reports he is eating without difficulty.  He reports he has issues with sleep related to the jerking of his legs.  Reports he has spoken to his PCP about it but has not been started on any  treatment.  He denies  suicidal/self-harm/homicidal ideation, psychosis, paranoia, and abnormal movement. Screenings completed during today's visit PHQ-9, C-SSRS, GAD-7, AIMS, Nutrition, and Pain, see scores below.  Medication options/treatment options discussed and adjustments made.  He agreed to trial of BuSpar  and was educated on the side effect/efficacy profile of BuSpar .  Educational information added to AVS. he reports he is interested in counseling/therapy.  States he spoke to someone once but never heard back from anyone to get appointment date and time.  Informed would inform staff that he needs an appointment to start counseling/therapy  During assessment Zachary Nash was dressed appropriately for age and current weather.  He was seated comfortably in view of camera with no noted distress.  He was alert, oriented x 4, calm, cooperative, and attentive.  Hi mood was congruent with affect.  He had normal speech and behavior.  Objectively there was no evidence of psychosis, mania, or delusional thinking.  He  was able to converse coherently and responded appropriately with goal directed thoughts, no distractibility, or pre-occupation.  Recommendations: Continue Cymbalta  80 mg daily, start BuSpar  5 mg twice daily He voiced understanding and agreement with information/recommendations given today.  Plan:   Medication management: Meds ordered this encounter  Medications   DULoxetine  (CYMBALTA ) 20 MG capsule    Sig: Take 1 capsule (20 mg total) by mouth daily. Take with Cymbalta  60 mg to total 80 mg daily    Dispense:  90 capsule    Refill:  0    Supervising Provider:   ARFEEN, SYED T [[id number]]   DULoxetine  (CYMBALTA ) 60 MG capsule    Sig: Take 1 capsule (60 mg total) by mouth daily. Take with Cymbalta  20 mg to total 80 mg daily    Dispense:  90 capsule    Refill:  0    Supervising Provider:   CURRY, SYED T [[id number]]   busPIRone  (BUSPAR ) 5 MG tablet    Sig: Take 1 tablet (5 mg total) by mouth 2 (two) times daily.     Dispense:  180 tablet    Refill:  0    Supervising Provider:   CURRY, SYED T [[id number]]   No orders of the defined types were placed in this encounter.  Labs:  ab orders not indicated at this time.     Counseling/Therapy: Another referral made.  Awaiting appointment date and time.  Safety:  He was instructed to call 911, [phone], mobile crisis, or present to the nearest emergency room should he experiences any suicidal/homicidal ideation, auditory/visual/hallucinations, or detrimental worsening of his mental health condition.    Zachary Nash participated in the development of this treatment plan and verbalized his understanding/agreement.    Follow Up: Return in 2 months for medication management.   Call in the interim for any side-effects, decompensation, questions, or problems  Collaboration of Care: Collaboration of Care: Medication Management AEB medication assessment, adjustment, refills, started BuSpar  and Primary Care Provider AEB follow-up with PCP related to possible restless leg syndrome  Patient/Guardian was advised Release of Information must be obtained prior to any record release in order to collaborate their care with an outside provider. Patient/Guardian was advised if they have not already done so to contact the registration department to sign all necessary forms in order for us  to release information regarding their care.   Consent: Patient/Guardian gives verbal consent for treatment and assignment of benefits for services provided during this visit. Patient/Guardian expressed understanding and agreed to proceed.    Fred Hammes,  NP 04/20/2024, 10:30 AM

## 2024-04-20 NOTE — Patient Instructions (Signed)

## 2024-09-12 ENCOUNTER — Ambulatory Visit: Admitting: Adult Health
